# Patient Record
Sex: Female | Born: 1935
Health system: Southern US, Community
[De-identification: ages and names within clinical notes are randomized; demographics above are authoritative.]

## PROBLEM LIST (undated history)

## (undated) DIAGNOSIS — R059 Cough, unspecified: Secondary | ICD-10-CM

## (undated) DIAGNOSIS — I10 Essential (primary) hypertension: Secondary | ICD-10-CM

## (undated) DIAGNOSIS — I4891 Unspecified atrial fibrillation: Secondary | ICD-10-CM

## (undated) DIAGNOSIS — D649 Anemia, unspecified: Secondary | ICD-10-CM

## (undated) DIAGNOSIS — R251 Tremor, unspecified: Secondary | ICD-10-CM

## (undated) DIAGNOSIS — R05 Cough: Secondary | ICD-10-CM

## (undated) DIAGNOSIS — J189 Pneumonia, unspecified organism: Secondary | ICD-10-CM

## (undated) DIAGNOSIS — I4719 Other supraventricular tachycardia: Secondary | ICD-10-CM

## (undated) DIAGNOSIS — G454 Transient global amnesia: Secondary | ICD-10-CM

## (undated) DIAGNOSIS — Z9289 Personal history of other medical treatment: Secondary | ICD-10-CM

## (undated) DIAGNOSIS — I471 Supraventricular tachycardia: Secondary | ICD-10-CM

## (undated) DIAGNOSIS — Z9189 Other specified personal risk factors, not elsewhere classified: Secondary | ICD-10-CM

## (undated) DIAGNOSIS — C801 Malignant (primary) neoplasm, unspecified: Secondary | ICD-10-CM

## (undated) DIAGNOSIS — K759 Inflammatory liver disease, unspecified: Secondary | ICD-10-CM

## (undated) HISTORY — DX: Other supraventricular tachycardia: I47.19

## (undated) HISTORY — PX: ABDOMINAL HERNIA REPAIR: SHX539

## (undated) HISTORY — DX: Essential (primary) hypertension: I10

## (undated) HISTORY — PX: BACK SURGERY: SHX140

## (undated) HISTORY — PX: TONSILLECTOMY: SUR1361

## (undated) HISTORY — DX: Inflammatory liver disease, unspecified: K75.9

## (undated) HISTORY — DX: Unspecified atrial fibrillation: I48.91

## (undated) HISTORY — DX: Supraventricular tachycardia: I47.1

## (undated) HISTORY — DX: Tremor, unspecified: R25.1

## (undated) HISTORY — DX: Transient global amnesia: G45.4

---

## 2007-01-10 ENCOUNTER — Ambulatory Visit: Payer: Self-pay | Admitting: Internal Medicine

## 2007-01-10 ENCOUNTER — Ambulatory Visit: Payer: Self-pay | Admitting: Cardiology

## 2007-01-10 ENCOUNTER — Inpatient Hospital Stay (HOSPITAL_COMMUNITY): Admission: EM | Admit: 2007-01-10 | Discharge: 2007-01-12 | Payer: Self-pay | Admitting: Emergency Medicine

## 2007-01-10 DIAGNOSIS — G454 Transient global amnesia: Secondary | ICD-10-CM

## 2007-01-11 ENCOUNTER — Encounter (INDEPENDENT_AMBULATORY_CARE_PROVIDER_SITE_OTHER): Payer: Self-pay | Admitting: Internal Medicine

## 2007-01-12 ENCOUNTER — Encounter (INDEPENDENT_AMBULATORY_CARE_PROVIDER_SITE_OTHER): Payer: Self-pay | Admitting: Internal Medicine

## 2007-01-20 ENCOUNTER — Ambulatory Visit: Payer: Self-pay | Admitting: Cardiology

## 2007-01-20 ENCOUNTER — Encounter: Payer: Self-pay | Admitting: Cardiology

## 2007-01-20 ENCOUNTER — Ambulatory Visit: Payer: Self-pay

## 2007-02-02 ENCOUNTER — Ambulatory Visit: Payer: Self-pay | Admitting: Cardiology

## 2007-03-16 ENCOUNTER — Ambulatory Visit: Payer: Self-pay | Admitting: Cardiology

## 2007-04-27 ENCOUNTER — Ambulatory Visit: Payer: Self-pay | Admitting: Cardiology

## 2007-07-15 ENCOUNTER — Ambulatory Visit: Payer: Self-pay | Admitting: Cardiology

## 2007-07-30 ENCOUNTER — Ambulatory Visit: Payer: Self-pay | Admitting: Cardiology

## 2007-07-30 LAB — CONVERTED CEMR LAB
Calcium: 9.1 mg/dL (ref 8.4–10.5)
Chloride: 99 meq/L (ref 96–112)
GFR calc Af Amer: 104 mL/min
Glucose, Bld: 95 mg/dL (ref 70–99)
Potassium: 3.5 meq/L (ref 3.5–5.1)
Sodium: 136 meq/L (ref 135–145)

## 2007-08-24 ENCOUNTER — Ambulatory Visit: Payer: Self-pay

## 2007-11-26 ENCOUNTER — Ambulatory Visit: Payer: Self-pay | Admitting: Cardiology

## 2007-11-26 LAB — CONVERTED CEMR LAB
Calcium: 9.4 mg/dL (ref 8.4–10.5)
Chloride: 103 meq/L (ref 96–112)
GFR calc Af Amer: 153 mL/min
TSH: 2.75 microintl units/mL (ref 0.35–5.50)

## 2007-12-01 ENCOUNTER — Ambulatory Visit: Payer: Self-pay | Admitting: Cardiology

## 2007-12-07 ENCOUNTER — Ambulatory Visit: Payer: Self-pay | Admitting: Cardiology

## 2007-12-07 LAB — CONVERTED CEMR LAB
CO2: 27 meq/L (ref 19–32)
Chloride: 108 meq/L (ref 96–112)
Creatinine, Ser: 0.6 mg/dL (ref 0.4–1.2)
GFR calc non Af Amer: 103 mL/min
Glucose, Bld: 96 mg/dL (ref 70–99)
Potassium: 4.2 meq/L (ref 3.5–5.1)
Sodium: 142 meq/L (ref 135–145)

## 2007-12-10 ENCOUNTER — Ambulatory Visit: Payer: Self-pay | Admitting: Cardiology

## 2007-12-17 ENCOUNTER — Ambulatory Visit: Payer: Self-pay | Admitting: Cardiology

## 2007-12-17 LAB — CONVERTED CEMR LAB
BUN: 17 mg/dL (ref 6–23)
Potassium: 4.4 meq/L (ref 3.5–5.1)
Sodium: 137 meq/L (ref 135–145)

## 2008-01-10 ENCOUNTER — Ambulatory Visit: Payer: Self-pay | Admitting: Cardiology

## 2008-01-10 LAB — CONVERTED CEMR LAB
BUN: 11 mg/dL (ref 6–23)
CO2: 27 meq/L (ref 19–32)
Calcium: 9.1 mg/dL (ref 8.4–10.5)
Chloride: 106 meq/L (ref 96–112)
Creatinine, Ser: 0.7 mg/dL (ref 0.4–1.2)
GFR calc non Af Amer: 86 mL/min
Glucose, Bld: 94 mg/dL (ref 70–99)
Sodium: 139 meq/L (ref 135–145)

## 2008-01-24 ENCOUNTER — Ambulatory Visit: Payer: Self-pay | Admitting: Cardiology

## 2008-02-08 ENCOUNTER — Ambulatory Visit: Payer: Self-pay | Admitting: Cardiology

## 2008-02-08 LAB — CONVERTED CEMR LAB
CO2: 26 meq/L (ref 19–32)
Chloride: 102 meq/L (ref 96–112)
Creatinine, Ser: 0.7 mg/dL (ref 0.4–1.2)
GFR calc Af Amer: 104 mL/min
Glucose, Bld: 96 mg/dL (ref 70–99)
Potassium: 4.5 meq/L (ref 3.5–5.1)
Sodium: 135 meq/L (ref 135–145)

## 2008-03-26 DIAGNOSIS — R259 Unspecified abnormal involuntary movements: Secondary | ICD-10-CM

## 2008-03-26 DIAGNOSIS — E669 Obesity, unspecified: Secondary | ICD-10-CM

## 2008-03-27 ENCOUNTER — Encounter (INDEPENDENT_AMBULATORY_CARE_PROVIDER_SITE_OTHER): Payer: Self-pay | Admitting: *Deleted

## 2008-03-27 ENCOUNTER — Encounter: Payer: Self-pay | Admitting: Cardiology

## 2008-03-27 ENCOUNTER — Ambulatory Visit: Payer: Self-pay | Admitting: Cardiology

## 2008-03-27 DIAGNOSIS — I471 Supraventricular tachycardia, unspecified: Secondary | ICD-10-CM | POA: Insufficient documentation

## 2008-05-22 ENCOUNTER — Encounter: Payer: Self-pay | Admitting: Cardiology

## 2008-05-22 ENCOUNTER — Ambulatory Visit: Payer: Self-pay | Admitting: Cardiology

## 2008-05-22 DIAGNOSIS — I10 Essential (primary) hypertension: Secondary | ICD-10-CM

## 2008-07-18 ENCOUNTER — Telehealth: Payer: Self-pay | Admitting: Cardiology

## 2008-09-07 ENCOUNTER — Telehealth: Payer: Self-pay | Admitting: Cardiology

## 2008-09-07 ENCOUNTER — Ambulatory Visit: Payer: Self-pay | Admitting: Internal Medicine

## 2008-09-07 DIAGNOSIS — Z9189 Other specified personal risk factors, not elsewhere classified: Secondary | ICD-10-CM | POA: Insufficient documentation

## 2008-09-07 HISTORY — DX: Other specified personal risk factors, not elsewhere classified: Z91.89

## 2008-09-12 ENCOUNTER — Encounter: Payer: Self-pay | Admitting: Internal Medicine

## 2008-09-14 ENCOUNTER — Ambulatory Visit: Payer: Self-pay

## 2008-09-27 ENCOUNTER — Encounter: Payer: Self-pay | Admitting: Cardiology

## 2008-09-28 ENCOUNTER — Ambulatory Visit: Payer: Self-pay | Admitting: Cardiology

## 2008-12-20 ENCOUNTER — Telehealth: Payer: Self-pay | Admitting: Cardiology

## 2008-12-28 ENCOUNTER — Encounter (INDEPENDENT_AMBULATORY_CARE_PROVIDER_SITE_OTHER): Payer: Self-pay | Admitting: *Deleted

## 2009-04-09 ENCOUNTER — Ambulatory Visit: Payer: Self-pay | Admitting: Cardiology

## 2009-08-15 ENCOUNTER — Telehealth: Payer: Self-pay | Admitting: Cardiology

## 2009-11-19 ENCOUNTER — Ambulatory Visit: Payer: Self-pay | Admitting: Cardiology

## 2010-02-05 ENCOUNTER — Emergency Department (HOSPITAL_COMMUNITY)
Admission: EM | Admit: 2010-02-05 | Discharge: 2010-02-06 | Payer: Self-pay | Source: Home / Self Care | Admitting: Emergency Medicine

## 2010-02-06 ENCOUNTER — Encounter: Payer: Self-pay | Admitting: Cardiology

## 2010-02-06 ENCOUNTER — Ambulatory Visit
Admission: RE | Admit: 2010-02-06 | Discharge: 2010-02-06 | Payer: Self-pay | Source: Home / Self Care | Attending: Cardiology | Admitting: Cardiology

## 2010-02-06 ENCOUNTER — Ambulatory Visit: Admission: RE | Admit: 2010-02-06 | Discharge: 2010-02-06 | Payer: Self-pay | Source: Home / Self Care

## 2010-02-06 ENCOUNTER — Telehealth: Payer: Self-pay | Admitting: Cardiology

## 2010-02-06 DIAGNOSIS — I499 Cardiac arrhythmia, unspecified: Secondary | ICD-10-CM

## 2010-02-06 DIAGNOSIS — R002 Palpitations: Secondary | ICD-10-CM

## 2010-02-26 ENCOUNTER — Encounter: Payer: Self-pay | Admitting: Cardiology

## 2010-03-10 LAB — CONVERTED CEMR LAB
BUN: 19 mg/dL (ref 6–23)
Basophils Absolute: 0 10*3/uL (ref 0.0–0.1)
CO2: 24 meq/L (ref 19–32)
CO2: 26 meq/L (ref 19–32)
Creatinine, Ser: 0.7 mg/dL (ref 0.4–1.2)
Creatinine, Ser: 0.7 mg/dL (ref 0.4–1.2)
Eosinophils Absolute: 0 10*3/uL (ref 0.0–0.7)
Eosinophils Relative: 0.4 % (ref 0.0–5.0)
GFR calc Af Amer: 104 mL/min
GFR calc non Af Amer: 81.53 mL/min (ref >60)
GFR calc non Af Amer: 85.84 mL/min (ref >60)
GFR calc non Af Amer: 86 mL/min
HCT: 48.3 % — ABNORMAL HIGH (ref 36.0–46.0)
MCHC: 33.4 g/dL (ref 30.0–36.0)
MCV: 89.7 fL (ref 78.0–100.0)
Monocytes Relative: 9.4 % (ref 3.0–12.0)
Platelets: 327 10*3/uL (ref 150.0–400.0)
RBC: 5.38 M/uL — ABNORMAL HIGH (ref 3.87–5.11)
Sodium: 135 meq/L (ref 135–145)
Sodium: 136 meq/L (ref 135–145)
WBC: 8.1 10*3/uL (ref 4.5–10.5)

## 2010-03-12 NOTE — Progress Notes (Signed)
Summary: refill meds  Phone Note Refill Request Call back at Home Phone [phone] Message from:  Patient on August 15, 2009 3:01 PM  Refills Requested: Medication #1:  SPIRONOLACTONE 50 MG TABS one by mouth daily  Medication #2:  METOPROLOL TARTRATE 25 MG TABS 1/2 tablet twice daily. [employer].    Method Requested: Fax to Mail Away Pharmacy Initial call taken by: Lorne Skeens,  August 15, 2009 3:02 PM  Follow-up for Phone Call        RX sent into pharmacy. Pt notified. Marrion Coy, CNA  August 16, 2009 10:20 AM  Follow-up by: Marrion Coy, CNA,  August 16, 2009 10:20 AM    Prescriptions: SPIRONOLACTONE 50 MG TABS (SPIRONOLACTONE) one by mouth daily  #90 x 3   Entered by:   Marrion Coy, CNA   Authorized by:   Rollene Rotunda, MD, [facility]   Signed by:   Marrion Coy, CNA on 08/16/2009   Method used:   Electronically to        [facility]* (mail-order)       [address] [place], [place]  [address]       Ph: [phone]       Fax: [phone]   RxID:   [id number] METOPROLOL TARTRATE 25 MG TABS (METOPROLOL TARTRATE) 1/2 tablet twice daily  #90 x 3   Entered by:   Marrion Coy, CNA   Authorized by:   Rollene Rotunda, MD, [facility]   Signed by:   Marrion Coy, CNA on 08/16/2009   Method used:   Electronically to        [facility]* (mail-order)       [address] [place], [place]  [address]       Ph: [phone]       Fax: [phone]   RxID:   [id number]

## 2010-03-12 NOTE — Assessment & Plan Note (Signed)
Summary: 6 month 401.1   Visit Type:  Follow-up Primary Provider:  Newt Lukes MD  CC:  HTN.  History of Present Illness: The patient presents for followup of difficult to control hypertension. I last saw her she has had no new cardiovascular complaints. She walks daily. With this level of activity she denies any chest pressure, neck or arm discomfort. She has no palpitations, presyncope or syncope. She has none of the episodes of  transient global amnesia that she had previously.  She has had some mild right greater than left ankle edema recently.  Current Medications (verified): 1)  Clonidine Hcl 0.3 Mg Tabs (Clonidine Hcl) .... By Mouth Two Times A Day 2)  Spironolactone 50 Mg Tabs (Spironolactone) .... One By Mouth Daily 3)  Amlodipine Besylate 10 Mg Tabs (Amlodipine Besylate) .... Take 1 Tablet By Mouth Daily 4)  Metoprolol Tartrate 25 Mg Tabs (Metoprolol Tartrate) .... 1/2 Tablet Twice Daily  Allergies (verified): 1)  ! Pcn 2)  ! Sulfa 3)  ! Codeine 4)  ! Aspirin 5)  ! Hydrochlorothiazide  Past History:  Past Medical History: Reviewed history from 09/07/2008 and no changes required. Hypertension  Tremors, benign familal  Transient global amnesia/AMS Atrial tachycardia, PSVT Hepatitis/Jaundice @ age 50 or 83  Past Surgical History: Reviewed history from 09/07/2008 and no changes required. Back surgery years ago. Hernia abdominal Tonsillectomy  Review of Systems       As stated in the HPI and negative for all other systems.   Vital Signs:  Patient profile:   77 year old female Height:      63 inches Weight:      160 pounds BMI:     28.45 Pulse rate:   61 / minute Resp:     16 per minute BP sitting:   168 / 75  (right arm)  Vitals Entered By: Marrion Coy, CNA (November 19, 2009 11:18 AM)  Physical Exam  General:  Well developed, well nourished, in no acute distress. Head:  normocephalic and atraumatic Eyes:  PERRLA/EOM intact; conjunctiva and lids  normal. Neck:  Neck supple, no JVD. No masses, thyromegaly or abnormal cervical nodes. Chest Wall:  no deformities or breast masses noted Lungs:  Clear bilaterally to auscultation and percussion. Heart:  Non-displaced PMI, chest non-tender; regular rate and rhythm, S1, S2 without murmurs, rubs or gallops. Carotid upstroke normal, no bruit. Normal abdominal aortic size, no bruits. Femorals normal pulses, no bruits. Pedals normal pulses. No edema, no varicosities. Abdomen:  Bowel sounds positive; abdomen soft and non-tender without masses, organomegaly, or hernias noted. No hepatosplenomegaly. Msk:  Back normal, normal gait. Muscle strength and tone normal. Extremities:  trace left pedal edema and 1+ right pedal edema.   Neurologic:  Alert and oriented x 3. Skin:  Intact without lesions or rashes. Cervical Nodes:  no significant adenopathy Inguinal Nodes:  no significant adenopathy Psych:  Normal affect.   EKG  Procedure date:  11/19/2009  Findings:      Sinus rhythm, rate 61, axis within normal limits thumb intervals within normal limits,  Impression & Recommendations:  Problem # 1:  ESSENTIAL HYPERTENSION, BENIGN (ICD-401.1) She brings a blood pressure diary today her pressures are typically will call. She is tolerating his medical regimen. He is quite happy with the situation as is her daughter therefore, I will not tinker with her regimen.  I will check a basic metabolic profile which should be done twice yearly at least on Spironolactone. Orders: TLB-BMP (Basic Metabolic Panel-BMET) (  80048-METABOL) EKG w/ Interpretation (93000)  Problem # 2:  PSVT (ICD-427.0) She has had no tachycardia palpitations. No change in therapy is indicated. Orders: EKG w/ Interpretation (93000)  Problem # 3:  OBESITY, UNSPECIFIED (ICD-278.00) Her weights have been stable.  She will continue with exercise and a healthy diet.  Patient Instructions: 1)  Your physician recommends that you schedule a  follow-up appointment in: 6 months with Dr Antoine Poche 2)  Your physician recommends that you have lab work today  BMP  401.1 v58.69 3)  Your physician recommends that you continue on your current medications as directed. Please refer to the Current Medication list given to you today.

## 2010-03-12 NOTE — Assessment & Plan Note (Signed)
Summary: 6 month rov/sl   Visit Type:  Follow-up Primary Provider:  Newt Lukes MD  CC:  HTN.  History of Present Illness: The patient presents for followup of difficult to control hypertension. Since I last saw her she has had no new medical problems. She is exercising about 30 minutes 5 times per week in the house. She has no limitations with this. She shows me a very recent blood pressure diary and her systolics are in the 140s and occasionally 150 range with diastolics in the 60s to 70s. Her heart rate is in the 40s to 50s. She denies any shortness of breath, PND or orthopnea. She has had no chest pressure, neck or arm discomfort. She doesn't feel palpitations, presyncope or syncope. She has had none of the altered consciousness that she had previously. She's had no tachyarrhythmias which have bothered her previously.  Current Medications (verified): 1)  Clonidine Hcl 0.3 Mg Tabs (Clonidine Hcl) .... By Mouth Two Times A Day 2)  Spironolactone 50 Mg Tabs (Spironolactone) .... One By Mouth Daily 3)  Amlodipine Besylate 10 Mg Tabs (Amlodipine Besylate) .... Take 1 Tablet By Mouth Daily 4)  Metoprolol Tartrate 25 Mg Tabs (Metoprolol Tartrate) .... 1/2 Tablet Twice Daily  Allergies (verified): 1)  ! Pcn 2)  ! Sulfa 3)  ! Codeine 4)  ! Aspirin 5)  ! Hydrochlorothiazide  Past History:  Past Medical History: Reviewed history from 09/07/2008 and no changes required. Hypertension  Tremors, benign familal  Transient global amnesia/AMS Atrial tachycardia, PSVT Hepatitis/Jaundice @ age 12 or 70  Past Surgical History: Reviewed history from 09/07/2008 and no changes required. Back surgery years ago. Hernia abdominal Tonsillectomy  Review of Systems       As stated in the HPI and negative for all other systems.   Vital Signs:  Patient profile:   77 year old female Height:      63 inches Weight:      159 pounds BMI:     28.27 Pulse rate:   52 / minute Resp:     16 per  minute BP sitting:   160 / 92  (right arm)  Vitals Entered By: Marrion Coy, CNA (April 09, 2009 2:55 PM)  Physical Exam  General:  Well developed, well nourished, in no acute distress. Head:  normocephalic and atraumatic Eyes:  PERRLA/EOM intact; conjunctiva and lids normal. Mouth:  Teeth, gums and palate normal. Oral mucosa normal. Neck:  Neck supple, no JVD. No masses, thyromegaly or abnormal cervical nodes. Chest Wall:  no deformities or breast masses noted Lungs:  Clear bilaterally to auscultation and percussion. Heart:  Non-displaced PMI, chest non-tender; regular rate and rhythm, S1, S2 without murmurs, rubs or gallops. Carotid upstroke normal, no bruit. Normal abdominal aortic size, no bruits. Femorals normal pulses, no bruits. Pedals normal pulses.  Abdomen:  Bowel sounds positive; abdomen soft and non-tender without masses, organomegaly, or hernias noted. No hepatosplenomegaly. Msk:  Back normal, normal gait. Muscle strength and tone normal. Extremities:  mild right greater than left lower extremity edema, no cyanosis, clubbing Neurologic:  resting tremor, otherwise cranial nerves intact, motor grossly intact Skin:  Intact without lesions or rashes.   EKG  Procedure date:  04/09/2009  Findings:      sinus bradycardia, rate 52, axis within normal limits, intervals within normal limits, no acute ST-T wave changes.  Impression & Recommendations:  Problem # 1:  ESSENTIAL HYPERTENSION, BENIGN (ICD-401.1) We discussed the fact that her blood pressure is still not  at target. However, she has been sensitive to a variety of medications. She actually feels quite well on the current regimen. She is at peak doses of that I think she would tolerate out each class. I am reluctant to try another class. She could reduce her salt intake and lose 5 pounds and potentially be at the 140/90.  She agrees to try this. Orders: EKG w/ Interpretation (93000)  Problem # 2:  PSVT  (ICD-427.0) She has had no symptomatic recurrence of this.she will remain on a very low-dose beta blocker.  Problem # 3:  OBESITY, UNSPECIFIED (ICD-278.00) She understands the need to lose weight as described above.  Patient Instructions: 1)  Your physician recommends that you schedule a follow-up appointment in: 6 months with Dr Antoine Poche 2)  Your physician recommends that you continue on your current medications as directed. Please refer to the Current Medication list given to you today.

## 2010-03-14 NOTE — Progress Notes (Signed)
Summary: pt had a-fib and was in ER  Phone Note Call from Patient Call back at [phone] or [phone]   Caller: Daughter/Janet Mazzurco Reason for Call: Talk to Nurse, Talk to Doctor Summary of Call: pt was taken to ED by EMS was having a-fib yesterday and they released her this morning at 2am and was told she needs to f/u with Dr. Antoine Poche today and daughter wants a call right away. Initial call taken by: Omer Jack,  February 06, 2010 8:44 AM  Follow-up for Phone Call        per daughter - was told pt had to be seen today to follow up AT [facility] that she was treated for in ED last night.  Appt was given for 11:30 am today Follow-up by: Charolotte Capuchin, RN,  February 06, 2010 9:16 AM

## 2010-03-14 NOTE — Assessment & Plan Note (Signed)
Summary: p/host AT FIB  pfh,rn   Visit Type:  Follow-up Primary Provider:  Newt Lukes MD  CC:  Arrhythmia.  History of Present Illness: The patient presents after having been seen in the emergency room last night for evaluation of arrhythmia. She's had a couple episodes over the past 3 months including one yesterday where she gets some vague symptoms of shakiness and sweating. She got some slight chills. She didn't particularly feel any tachycardia palpitations. She wasn't presyncopal and didn't have any syncope. She had no chest pressure, neck or arm discomfort. The first episode apparently resolved fairly quickly. Last night however it persisted and EMS was called. She was sent to be in atrial fibrillation. I do not see an EKG in the system documenting this. She was said to convert to sinus rhythm about 5 hours after the onset. I do see an EKG from this morning in sinus rhythm. She had normal blood pressure throughout. Cardiac enzymes and other blood work was unremarkable and she was discharged from the ER. She has otherwise been feeling well. She's not been describing any chest pressure, neck or arm discomfort. She has not been having any weight gain or edema. She has had no cough fevers or chills.  Current Medications (verified): 1)  Clonidine Hcl 0.3 Mg Tabs (Clonidine Hcl) .... By Mouth Two Times A Day 2)  Spironolactone 50 Mg Tabs (Spironolactone) .... One By Mouth Daily 3)  Amlodipine Besylate 10 Mg Tabs (Amlodipine Besylate) .... Take 1 Tablet By Mouth Daily 4)  Metoprolol Tartrate 25 Mg Tabs (Metoprolol Tartrate) .... 1/2 Tablet Twice Daily  Allergies (verified): 1)  ! Pcn 2)  ! Sulfa 3)  ! Codeine 4)  ! Aspirin 5)  ! Hydrochlorothiazide  Past History:  Past Medical History: Reviewed history from 09/07/2008 and no changes required. Hypertension  Tremors, benign familal  Transient global amnesia/AMS Atrial tachycardia, PSVT Hepatitis/Jaundice @ age 33 or 46  Past  Surgical History: Reviewed history from 09/07/2008 and no changes required. Back surgery years ago. Hernia abdominal Tonsillectomy  Review of Systems       As stated in the HPI and negative for all other systems.   Vital Signs:  Patient profile:   77 year old female Height:      63 inches Weight:      162 pounds BMI:     28.80 Pulse rate:   77 / minute Resp:     16 per minute BP sitting:   148 / 90  (right arm)  Vitals Entered By: Marrion Coy, CNA (February 06, 2010 11:42 AM)  Physical Exam  General:  Well developed, well nourished, in no acute distress. Head:  normocephalic and atraumatic Eyes:  PERRLA/EOM intact; conjunctiva and lids normal. Mouth:  Teeth, gums and palate normal. Oral mucosa normal. Neck:  Neck supple, no JVD. No masses, thyromegaly or abnormal cervical nodes. Chest Wall:  no deformities Lungs:  Clear bilaterally to auscultation and percussion.   Detailed Cardiovascular Exam  Neck    Carotids: Carotids full and equal bilaterally without bruits.      Neck Veins: Normal, no JVD.    Heart    Inspection: no deformities or lifts noted.      Palpation: normal PMI with no thrills palpable.      Auscultation: regular rate and rhythm, S1, S2 without murmurs, rubs, gallops, or clicks.    Vascular    Abdominal Aorta: no palpable masses, pulsations, or audible bruits.      Femoral  Pulses: normal femoral pulses bilaterally.      Pedal Pulses: pulses normal in all 4 extremities    Radial Pulses: normal radial pulses bilaterally.      Peripheral Circulation: no clubbing, cyanosis, or edema noted with normal capillary refill.     EKG  Procedure date:  02/06/2010  Findings:      Sinus rhythm, rate 58, axis within normal limits, intervals within normal limits, no acute ST-T wave changes.  Impression & Recommendations:  Problem # 1:  CARDIAC ARRHYTHMIA (ICD-427.9) Patient has had multifocal atrial tachycardia incorrectly read as atrial fibrillation in  the past. I will need to get the EKG from EMS to confirm atrial fibrillation at which point she would need Coumadin. I am going to apply a 21 day monitor to see how frequently she is having arrhythmias. For now I would not suggest a change in her therapy as the events are symptomatically fairly infrequent.  Problem # 2:  ESSENTIAL HYPERTENSION, BENIGN (ICD-401.1) Her blood pressure has actually been well controlled. She will continue meds as listed.  Other Orders: Event (Event)  Patient Instructions: 1)  Your physician recommends that you schedule a follow-up appointment in 2 months with Dr Antoine Poche 2)  Your physician recommends that you continue on your current medications as directed. Please refer to the Current Medication list given to you today. 3)  Your physician has recommended that you wear an event monitor to wear for 21 days.  Event monitors are medical devices that record the heart's electrical activity. Doctors most often use these monitors to diagnose arrhythmias. Arrhythmias are problems with the speed or rhythm of the heartbeat. The monitor is a small, portable device. You can wear one while you do your normal daily activities. This is usually used to diagnose what is causing palpitations/syncope (passing out).

## 2010-04-03 ENCOUNTER — Ambulatory Visit (INDEPENDENT_AMBULATORY_CARE_PROVIDER_SITE_OTHER): Payer: MEDICARE | Admitting: Cardiology

## 2010-04-03 ENCOUNTER — Encounter: Payer: Self-pay | Admitting: Cardiology

## 2010-04-03 DIAGNOSIS — R002 Palpitations: Secondary | ICD-10-CM

## 2010-04-03 DIAGNOSIS — I10 Essential (primary) hypertension: Secondary | ICD-10-CM

## 2010-04-09 NOTE — Assessment & Plan Note (Signed)
Summary: 2 month.dm/sp   Visit Type:  Follow-up Primary Provider:  Newt Lukes MD  CC:  palpitations.  History of Present Illness: The patient presents for followup of palpitations. She was in the emergency room earlier in the year with what was described as atrial fibrillation. However, I went to the hospital told those records and reviewed EMS strips and this was atrial tachycardia multifocal and not fibrillation. She wore an event monitor and there was no evidence of fibrillation. She had no symptomatic arrhythmias. Since that time she has felt well and has had no presyncope or syncope. He has had no chest discomfort or shortness of breath.  Current Medications (verified): 1)  Clonidine Hcl 0.3 Mg Tabs (Clonidine Hcl) .... By Mouth Two Times A Day 2)  Spironolactone 50 Mg Tabs (Spironolactone) .... One By Mouth Daily 3)  Amlodipine Besylate 10 Mg Tabs (Amlodipine Besylate) .... Take 1 Tablet By Mouth Daily 4)  Metoprolol Tartrate 25 Mg Tabs (Metoprolol Tartrate) .... 1/2 Tablet Twice Daily  Allergies (verified): 1)  ! Pcn 2)  ! Sulfa 3)  ! Codeine 4)  ! Aspirin 5)  ! Hydrochlorothiazide  Past History:  Past Medical History: Reviewed history from 09/07/2008 and no changes required. Hypertension  Tremors, benign familal  Transient global amnesia/AMS Atrial tachycardia, PSVT Hepatitis/Jaundice @ age 39 or 14  Past Surgical History: Reviewed history from 09/07/2008 and no changes required. Back surgery years ago. Hernia abdominal Tonsillectomy  Review of Systems       As stated in the HPI and negative for all other systems.   Vital Signs:  Patient profile:   77 year old female Height:      63 inches Weight:      165 pounds BMI:     29.33 Pulse rate:   54 / minute Resp:     18 per minute BP sitting:   169 / 73  (right arm)  Vitals Entered By: Marrion Coy, CNA (April 03, 2010 11:33 AM)  Physical Exam  General:  Well developed, well nourished, in no  acute distress. Head:  normocephalic and atraumatic Neck:  Neck supple, no JVD. No masses, thyromegaly or abnormal cervical nodes. Chest Wall:  no deformities Lungs:  Clear bilaterally to auscultation and percussion. Abdomen:  Bowel sounds positive; abdomen soft and non-tender without masses, organomegaly, or hernias noted. No hepatosplenomegaly. Msk:  Back normal, normal gait. Muscle strength and tone normal. Extremities:  trace left pedal edema and 1+ right pedal edema.   Neurologic:  Alert and oriented x 3. Skin:  Intact without lesions or rashes. Cervical Nodes:  no significant adenopathy Inguinal Nodes:  no significant adenopathy Psych:  Normal affect.   Detailed Cardiovascular Exam  Neck    Carotids: Carotids full and equal bilaterally without bruits.      Neck Veins: Normal, no JVD.    Heart    Inspection: no deformities or lifts noted.      Palpation: normal PMI with no thrills palpable.      Auscultation: regular rate and rhythm, S1, S2 without murmurs, rubs, gallops, or clicks.    Vascular    Abdominal Aorta: no palpable masses, pulsations, or audible bruits.      Femoral Pulses: normal femoral pulses bilaterally.      Pedal Pulses: pulses normal in all 4 extremities    Radial Pulses: normal radial pulses bilaterally.      Peripheral Circulation: no clubbing, cyanosis, or edema noted with normal capillary refill.  Impression & Recommendations:  Problem # 1:  CARDIAC ARRHYTHMIA (ICD-427.9) She has had no further symptomatic dysrhythmias. No change in therapy is indicated. I see no indication for Coumadin as I see no documented atrial fibrillation.  Problem # 2:  ESSENTIAL HYPERTENSION, BENIGN (ICD-401.1) Her blood pressure has fluctuated in the past. It is elevated today. I have asked her to check it about twice a week and if her systolics are consistently above 100 we will change her medications  Patient Instructions: 1)  Your physician recommends that you  schedule a follow-up appointment in: 6 months withDr Nadean Montanaro 2)  Your physician recommends that you continue on your current medications as directed. Please refer to the Current Medication list given to you today.

## 2010-04-18 NOTE — Procedures (Signed)
Summary: Summary Report  Summary Report   Imported By: Erle Crocker 04/10/2010 16:11:36  _____________________________________________________________________  External Attachment:    Type:   Image     Comment:   External Document

## 2010-04-22 LAB — POCT I-STAT, CHEM 8
BUN: 18 mg/dL (ref 6–23)
Calcium, Ion: 1.09 mmol/L — ABNORMAL LOW (ref 1.12–1.32)
Glucose, Bld: 136 mg/dL — ABNORMAL HIGH (ref 70–99)
Hemoglobin: 15.6 g/dL — ABNORMAL HIGH (ref 12.0–15.0)

## 2010-04-22 LAB — POCT CARDIAC MARKERS
Myoglobin, poc: 52.1 ng/mL (ref 12–200)
Troponin i, poc: <0.05 ng/mL (ref 0.00–0.09)

## 2010-04-22 LAB — URINALYSIS, ROUTINE W REFLEX MICROSCOPIC
Bilirubin Urine: NEGATIVE
Ketones, ur: NEGATIVE mg/dL
Nitrite: NEGATIVE
Protein, ur: NEGATIVE mg/dL
Specific Gravity, Urine: 1.009 (ref 1.005–1.030)
pH: 6 (ref 5.0–8.0)

## 2010-04-22 LAB — URINE MICROSCOPIC-ADD ON

## 2010-06-25 NOTE — H&P (Signed)
NAMEMATTEA, SEGER NO.:  [id number]   MEDICAL RECORD NO.:  [id number]          PATIENT TYPE:  INP   LOCATION:  6737                         FACILITY:  [facility]   PHYSICIAN:  Therisa Doyne, MD    DATE OF BIRTH:  12/17/1927   DATE OF ADMISSION:  01/10/2007  DATE OF DISCHARGE:                              HISTORY & PHYSICAL   PRIMARY CARE PHYSICIAN:  Chales Salmon. Abigail Miyamoto, M.D.   CHIEF COMPLAINT:  Episode of confusion.   HISTORY OF PRESENT ILLNESS:  This 77 year old white female with past  medical history significant for hypertension who presents to the  emergency department after an episode of confusion and reported amnesia  today.  This morning the patient reports going to church and was in her  usual state health; however, when she came home, she had difficulty  opening her front door with using her keys.  Her husband came to the  door and opened it for her.  When she entered, she began asking random  questions to her husband.  These statements were coherent sentences;  however, the husband reports that the patient neither answers these  questions and her behavior was somewhat bizarre.  The patient remembers  none of this behavior.  She was brought to the emergency department for  further evaluation where she was found to have an elevated blood  pressure of 186/99, this was treated medically and her blood pressure  has since decreased.  With the improvement in her blood pressure, her  mental status has also improved.   The patient denies any headaches, vision changes, slurred speech,  numbness, weakness, or tingling.  Additionally, she denies fever,  chills, chest pain, shortness of breath, cough, or genitourinary  symptoms.   PAST MEDICAL HISTORY:  1. Hypertension.  2. Seasonal allergies.   SOCIAL HISTORY:  The patient lives at home with her husband.  She denies  tobacco, alcohol, or drugs.   FAMILY HISTORY:  Positive for Parkinson's disease.   MEDICATIONS:  1. Toprol XL 25 mg daily.  2. Benicar 20 mg daily.   ALLERGIES:  1. PENICILLIN.  2. SULFA.  3. CODEINE.   REVIEW OF SYSTEMS:  All systems were reviewed and are negative, except  as mentioned above in history of present illness.   PHYSICAL EXAMINATION:  VITAL SIGNS:  Temperature 97.8, blood pressure  123/68, pulse 66, respirations 20.  GENERAL:  No acute distress.  HEENT:  Normocephalic, atraumatic.  Oropharynx pink and moist without  any lesions.  NECK:  No carotid bruits.  No lymphadenopathy.  No thyroid masses.  CARDIOVASCULAR:  Regular rate and rhythm.  No murmurs, rubs, or gallops.  CHEST:  Clear to auscultation bilaterally.  ABDOMEN:  Soft, nontender, nondistended.  EXTREMITIES:  No cyanosis, clubbing.  There was 1+ lower extremity edema  with palpable dorsalis pedis pulses.  SKIN:  No rashes.  BACK:  No CVA tenderness.  NEUROLOGIC:  Alert and oriented x3.  Cranial nerves II-XII are grossly  intact with no focal deficits.  Muscle strength is 5/5 bilateral upper  and lower extremities and sensory exam  was grossly intact.   LABORATORY DATA:  Show a CBC and CMP within normal limits.  First set of  cardiac enzymes were negative.   ASSESSMENT AND PLAN:  A 77 year old white female with a past medical  history significant for hypertension who presents to the emergency  department with a transient episode of confusion.  1. We will admit the patient to the [facility].  2. Episode of confusion.  Differential diagnosis is broad and includes      transient ischemic attack versus cerebrovascular accident versus      hypertensive encephalopathy.  Additionally, the odds can include a      seizure; however, this is less likely versus an underlying      infection.  My suspicion is that this is likely related to      fluctuations in her blood pressure based on the fact that she is      improved with better control of her blood pressure.  To further      work  this up we will followup her CAT scan, which was obtained in      the emergency department.  We will check an MRI and MRA in the      morning to rule out a cerebrovascular accident or transient      ischemic attack.  We will check a TSH and vitamin B12 levels,      folate level, urinalysis, and urine culture.  We will also check a      transthoracic echocardiogram with bubble study as well as carotid      Doppler's and transcranial Doppler's.  We will aggressively treat      the patient's blood pressure as this likely was the cause of her      confusion.  3. Hypertension.  Currently stable.  We will continue her on her beta      blocker and ARB.  4. Fluids, electrolytes and nutrition.  Saline lock IV fluids.      Electrolytes are stable.  Regular diet.  5. Prophylaxis.  Gastrointestinal prophylaxis, Protonix.  For deep      venous thrombosis prophylaxis subcutaneous Lovenox.      Therisa Doyne, MD     SJT/MEDQ  D:  01/10/2007  T:  01/11/2007  Job:  [id number]

## 2010-06-25 NOTE — Assessment & Plan Note (Signed)
[facility]                            CARDIOLOGY OFFICE NOTE   NAME:POWELLTresia, Revolorio                         MRN:          [id number]  DATE:01/20/2007                            DOB:          Sep 30, 1927    PRIMARY CARE PHYSICIAN:  Dr. Abigail Miyamoto.   REASON FOR PRESENTATION:  Evaluate patient with hypertension.   HISTORY OF PRESENT ILLNESS:  This is the second visit with this patient  who I met in the hospital.  She had altered mental status felt possibly  related to the hypertensive urgency.  We were consulted for management  of her difficulty to control blood pressure.  I made some changes  including switching her off of ARB as she had had chronic complaints of  upper respiratory congestion and sinus difficulty which there is a small  possibility of being related to that medication.  I chose Norvasc, low  dose diuretic, and to continue her beta blocker.  She has been keeping  excellent records.  Her blood pressures in the 150's to 160's in the  morning, but does go up to the 170's, 180's or 190's in the afternoon.  She is not having any further episodes of altered mental status which  was her presenting complaint.  She actually thinks that her congestion  and sinus problems are improved.  She is not having any chest  discomfort, neck or arm discomfort.  She is not having any palpitations,  PND or orthopnea  (of note the patient did have atrial tachycardia in  the hospital documented).   PAST MEDICAL HISTORY:  Hypertension x7-8 years, tremors, back surgery  several years ago.   ALLERGIES:  PENICILLIN, SULFA questionably and CODEINE.   MEDICATIONS:  1. Aspirin 81 mg daily.  2. Toprol 25 mg daily.  3. Amlodipine 5 mg daily.  4. Hydrochlorothiazide 12.5 mg daily.   REVIEW OF SYSTEMS:  As stated in the HPI and, otherwise, negative for  other systems.   PHYSICAL EXAMINATION:  GENERAL:  The patient is in no distress.  VITAL SIGNS:  Blood pressure  178/86, heart rate 67 and regular, weight  206 pounds, body mass index 33.  HEENT:  Eyes unremarkable, pupils equal, round, and reactive to light,  fundi not visualized, oral mucosa unremarkable.  NECK:  No jugular venous distention at 45 degrees, carotid upstroke  brisk and symmetric, no bruits, no thyromegaly.  LYMPHATICS:  None.  LUNGS:  Clear to auscultation bilaterally.  BACK:  No costovertebral angle tenderness.  CHEST:  Unremarkable.  HEART:  PMI not displaced or sustained.  S1, S2 within normal limits.  No S3, no S4.  No clicks, no rubs, no murmurs.  ABDOMEN:  Flat, positive bowel sounds normal in frequency and pitch.  No  bruits, no rebound, no guarding, no midline pulsatile mass.  No  hepatomegaly, no splenomegaly.  SKIN:  No rashes, no nodules.  EXTREMITIES:  2+ pulses throughout.  No edema, cyanosis or clubbing.  NEUROLOGICAL:  Oriented to person, place, and time.  Cranial nerves II-  XII grossly intact.  Motor grossly intact.   EKG:  Sinus bradycardia, rate 56, axis rightward, intervals within  normal limits.  No acute ST wave change.   ASSESSMENT/PLAN:  1. Hypertension.  Her blood pressure is still not at target.  I am      going to increase her hydrochlorothiazide to 25 mg daily.  I am      going to add potassium 10 mEq daily.  She already takes potassium-      containing foods.  I am going to change her to Toprol Immediate      Release 25 mg b.i.d.  I have explained to her that I suspect it      will take several adjustments to get to a target blood pressure      with a regimen that she tolerates.  Of note the patient did have an      echocardiogram today that demonstrated no evidence of an embolic      source, normal left ventricular function, no regional wall motion      abnormalities or valve abnormalities.  Tremors are felt to be      benign.  2. Back pain.  She has had back surgery, but has no ongoing acute      issues.  3. Altered mental status felt possibly  related to her hypertensive      urgency.  4. Followup.  I will see the patient again in about two weeks for her      next medication adjustment.  Her family insists on continued      followup in this office for control of her blood pressure.     Rollene Rotunda, MD, [facility]  Electronically Signed    JH/MedQ  DD: 01/20/2007  DT: 01/21/2007  Job #: [id number]   cc:   Chales Salmon. Abigail Miyamoto, M.D.

## 2010-06-25 NOTE — Assessment & Plan Note (Signed)
[facility]                            CARDIOLOGY OFFICE NOTE   NAME:POWELLTilia, Faso                         MRN:          [id number]  DATE:12/10/2007                            DOB:          1927/09/12    PRIMARY CARE PHYSICIAN:  [name]. Panosh, MD   REASON FOR PRESENTATION:  Evaluate the patient with bradycardia and  hypertension.   HISTORY OF PRESENT ILLNESS:  The patient returns for followup of the  above.  Since I last saw her, she had a rash and stopped  hydrochlorothiazide.  This actually seems to have improved the rash.  She has been feeling well since then.  She does bring her blood pressure  diary and her systolics are still always about 150, sometimes in the  170, and even low 180s.  Diastolics have been from 103-88.  The heart  rate when she takes this, blood pressure is fine.  She does not have any  presyncope or syncope.  She has had no chest discomfort, neck, or arm  discomfort.   Of note, I did put Holter monitor on for 48 hours to look at any  irregular rhythm that was on an EKG.  It appeared to be in atrial  tachycardia.  In fact, she did have some evidence of this atrial  tachycardia.  It was regular.  I do not see evidence of atrial  fibrillation.  There were lots of premature atrial contractions.  She  also had some brady arrhythmias.  The longest pause recorder was 2.9  seconds have not seen this recorded.  Many pauses of about 2 seconds.  All of her brady arrhythmias occurred during the sleeping hours.  It is  very clear from the trend that her heart rate drops in the 40s and 50s  when she is asleep.  It goes up twice during those hours when she gets  up to go to the bathroom.  It goes up during the day and looks to have a  normal chronotropic response.  There was some episodes of sustained  tachyarrhythmia though short-lived which was the atrial tachycardia.  With all of this, she denies any symptoms whatsoever.  I again as  mentioned, she has had no presyncope or syncope.   PAST MEDICAL HISTORY:  Hypertension, difficult to control x 8 years,  tremors, and back surgery years ago.   ALLERGIES:  PENICILLIN, SULFA, and CODEINE.   MEDICATIONS:  1. Clonidine 0.3 mg q.12 h.  2. Amlodipine 10 mg daily.   REVIEW OF SYSTEMS:  As stated in the HPI and otherwise negative for  other systems.   PHYSICAL EXAMINATION:  GENERAL:  The patient is pleasant and in no  distress.  VITAL SIGNS:  Blood pressure 167/96, heart rate 93 and regular, weight  178 pounds, and body mass index 29.  HEENT:  Eyes are unremarkable; pupils equal, round, and reactive to  light; fundi not visualized; oral mucosa unremarkable.  NECK:  No  jugular venous distention at 45 degrees; carotid upstrokes brisk and  symmetric; no bruits, no thyromegaly.  LYMPHATICS:  No  cervical,  axillary, or inguinal adenopathy.  LUNGS:  Clear to auscultation bilaterally.  BACK:  No costovertebral angle tenderness.  CHEST:  Unremarkable.  HEART:  PMI not displaced or sustained; S1 and S2 within normal limits;  no S3, no S4; no clicks, no rubs, no murmurs.  ABDOMEN:  Obese; positive  bowel sounds; normal in frequency and pitch; no bruits, rebound,  guarding, or midline pulsatile mass; no hepatomegaly; no splenomegaly.  SKIN:  No rashes; no nodules.  EXTREMITIES:  Pulses 2+ throughout; no edema, cyanosis, or clubbing.  NEURO:  Oriented to person, place, and time; cranial nerves II-XII  grossly intact; motor grossly intact.   ASSESSMENT AND PLAN:  1. Hypertension.  Blood pressure is still not well controlled.  I have      reviewed the options.  I do not want to give her any drugs that      will further slower heart rate.  Therefore, I am going to try      spironolactone.  We discussed hyperkalemia.  We will watch      potassium in 1 week and 1 month and then periodically thereafter if      she remains on this.  She should keep her blood pressure diary.  2.  Bradycardia.  The patient did have some sinus pauses of up to 2.9      seconds.  However, all of her brady arrhythmias were during the      sleeping hours.  She had no symptoms.  I discussed this at length      with the family.  There is no class I or IIA indication for pacing      in this situation.  I will avoid any AV nodal blocking agents.  In      fact, it might have to reconsider clonidine and Norvasc if she has      any further problems, but doubt if this is contributing.  I may      follow her up with telemetry in the future.  Again, I had a long      discussion with the patient and family about this.  3. Followup.  I would like to see her back in about 6 weeks or sooner.     Rollene Rotunda, MD, [facility]  Electronically Signed    JH/MedQ  DD: 12/10/2007  DT: 12/11/2007  Job #: [id number]   cc:   [name]. Fabian Sharp, MD

## 2010-06-25 NOTE — Assessment & Plan Note (Signed)
[facility]                            CARDIOLOGY OFFICE NOTE   NAME:[name], [name]                         MRN:          [id number]  DATE:02/02/2007                            DOB:          25-May-1927    PRIMARY CARE PHYSICIAN:  Chales Salmon. Abigail Miyamoto, M.D.   REASON FOR VISIT:  Evaluate patient with hypertension.   HISTORY OF PRESENT ILLNESS:  The patient presents for follow-up of her  hypertension.  At the last visit I increased her hydrochlorothiazide to  25 mg a day.  I added potassium.  I changed her from Toprol XL to  immediate release.  She has been keeping a good blood pressure diary.  She says she has much less swelling since increasing the  hydrochlorothiazide.  She has been walking a little better because of  this.  Her blood pressures have been slightly elevated particularly in  the evenings in the 160's.  They are better controlled than they had  been.  Her heart rate has been in the 50's and 60's.  She does not have  any palpitations, presyncope, or syncope.  She has had none of the  episodes of altered mental status that prompted her recent  hospitalization.  She has had no chest pain or shortness of breath.   PAST MEDICAL HISTORY:  Hypertension x7-8 years, tremors, back surgery  several years ago.   ALLERGIES:  PENICILLIN, SULFA, CODEINE.   MEDICATIONS:  1. Aspirin 81 mg daily.  2. Amlodipine 5 mg daily.  3. Hydrochlorothiazide 25 mg daily.  4. Potassium 10 mEq daily.  5. Metoprolol 25 mg b.i.d.   REVIEW OF SYSTEMS:  As stated in the HPI and otherwise negative for  other systems.   PHYSICAL EXAMINATION:  GENERAL:  The patient is in no distress.  VITAL SIGNS:  Blood pressure 156/89, heart rate 64 and regular.  HEENT:  Eyes unremarkable.  Pupils equal, round, and reactive to light.  Fundi not visualized.  Oral mucosa unremarkable.  NECK:  No jugular venous distention to 45 degrees.  Carotid upstrokes  brisk and symmetric.  No  bruits and no thyromegaly.  LYMPHATICS:  No cervical, axillary, or inguinal adenopathy.  LUNGS:  Clear to auscultation bilaterally.  BACK:  No costovertebral angle tenderness.  CHEST:  Unremarkable.  HEART:  PMI not displaced or sustained.  S1 and S2 within normal limits.  No S3, no S4, no clicks, no rubs, no murmurs.  ABDOMEN:  Obese, positive bowel sounds normal in frequency and pitch.  No bruits, no rebound, no guarding, no midline pulsatile mass, no  hepatomegaly, and no splenomegaly.  SKIN:  No rashes and no nodules.  EXTREMITIES:  2+ pulses throughout, no edema, no cyanosis or clubbing.  NEUROLOGY:  Oriented to person, place, and time.  Cranial nerves II-XII  grossly intact.  Motor grossly intact.   ASSESSMENT:  1. Hypertension.  Her blood pressure is better controlled, though      still not at target.  At this point I am going to pick a Catapres      patch #1.  I  do not want to give her beta blockers because of her      bradycardia.  I am going to avoid ACE and ARB's as I think she had      some reaction with these with sinus drainage and cough.  She has      been better since stopping her ARB.  I am going to avoid higher      doses of amlodipine because of previous problems with swelling.      Hopefully she will not have fatigue or bradycardia with the      Catapres patch.  She will continue to keep her blood pressure      diary.  2. Obesity.  We discussed the need to lose weight with diet and      exercise.  3. Tachycardia.  She has had some atrial tachycardia, but is not      feeling this.  No further evaluation is warranted.   FOLLOWUP:  I will see her back in about six weeks for further medicine  titration.  I will continue to manage her hypertension per her family  request.     Rollene Rotunda, MD, [facility]  Electronically Signed    JH/MedQ  DD: 02/02/2007  DT: 02/02/2007  Job #: [id number]

## 2010-06-25 NOTE — Assessment & Plan Note (Signed)
[name] [name]                            CARDIOLOGY OFFICE NOTE   NAME:[name], [name]                         MRN:          [id number]  DATE:07/15/2007                            DOB:          [birth year]    PRIMARY CARE PHYSICIAN:  None.   REASON FOR PRESENTATION:  Evaluate patient with hypertension.   HISTORY OF PRESENT ILLNESS:  The patient is 77 years old.  She presents  for a follow-up of the above.  She has had problems with a rash since I  last saw her.  She developed hives.  She developed a rash on her left  neck.  She was told the rash on the neck was contacted dermatitis.  She  stopped taking aspirin thinking it was related to the hives.  She  wondered if it could have been some of her medications as well.  Her  family says she gets quite anxious about things like this.  She has been  keeping her blood pressure and when she got hives she started noticing  that her blood pressure was going up.  She has been in the 160s - 180s  systolic with diastolics in the 70s to 90s.  Prior to this she was  better controlled.  She has had no new cardiovascular complaints.  She  denies any chest discomfort, neck or arm discomfort.  She said no  palpitation, presyncope or syncope.  She has had no PND or orthopnea.   PAST MEDICAL HISTORY:  1. Hypertension x 8 years  2. Tremors.  3. Back surgery years ago.   ALLERGIES:  PENICILLIN, SULFA AND CODEINE.   MEDICATIONS:  1. Aspirin 81 mg daily (the patient is not taking it currently).  2. Amlodipine 5 mg daily.  3. Hydrochlorothiazide 25 mg daily.  4. Potassium 10 mEq daily.  5. Clonidine 0.2 mg q.12 hours.   REVIEW OF SYSTEMS:  As stated in the HPI and otherwise negative for  other systems.   PHYSICAL EXAMINATION:  Negative for all other systems.   PHYSICAL EXAMINATION:  The patient is in no distress.  Blood pressure  168/94, heart rate 94 and regular, body mass index 30.  HEENT:  Eyelids unremarkable,  pupils equally round and reactive to  light, fundi not visualized, oral mucosa unremarkable.  NECK:  No jugular venous distention at 45 degrees, carotid upstroke  brisk and symmetrical, no bruits, no thyromegaly.  LYMPHATICS:  No cervical, axillary or inguinal adenopathy.  LUNGS:  Clear to auscultation bilaterally.  BACK:  No costovertebral angle tenderness.  CHEST:  Unremarkable.  HEART:  PMI not displaced or sustained, S1-S2 within normal limits, no  S3-S4, no clicks, no rubs, no murmurs.  ABDOMEN:  Obese, positive bowel sounds normal in frequency and pitch, no  bruits, no rebound, no guarding, no midline pulsatile mass, no  hepatomegaly, splenomegaly.  SKIN:  No rashes, no nodules.  EXTREMITIES:  Two plus pulses, no edema.  NEURO:  Oriented to person, place and time, cranial nerves II-XII  grossly intact, motor grossly intact, resting tremor.   EKG sinus rhythm, rate 94,  axis rightward, intervals within normal  limits, no acute ST-wave changes.   ASSESSMENT/PLAN:  1. Hypertension, blood pressure is not well-controlled.  Part of this      is probably anxiety.  I am going to increase her amlodipine to 7.5      mg daily.  We also discussed means of dealing with her anxiety.  I      am going to take the liberty of giving her Xanax 0.25 mg q.12 hours      p.r.n. to take when her blood pressure is very high and she is      agitated.  I am going to ask her to discuss this with her new      primary care doctor.  This going to be Dr. Fabian Sharp.  Otherwise, she      will continue the other medications as listed and keep the blood      pressure diary.  2. Anxiety as above.  3. Back pain, she has continued back discomfort and will follow up      with Dr. Fabian Sharp about this.  4. Lower extremity edema, the patient does have very mild edema.      However, this is not problematic.  We will keep an eye on this we      increase the amlodipine.  5. Follow-up, will see her back in about 3 months for  her next follow-      up but sooner if she has any problems with her blood pressure.  Rollene Rotunda, MD, [facility]  Electronically Signed    JH/MedQ  DD: 07/15/2007  DT: 07/15/2007  Job #: [id number]   cc:   [name]. Fabian Sharp, MD

## 2010-06-25 NOTE — Assessment & Plan Note (Signed)
[facility]                            CARDIOLOGY OFFICE NOTE   NAME:[name] [name]                       MRN:          [id number]  DATE:04/27/2007                            DOB:          1927-05-13    SUBJECTIVE:  [name] is a 77 year old white female who is following  with Dr. Antoine Poche for hypertension.  Since her last office visit on  March 16, 2007, she states that she has been doing well.  She has been  increasing her activity around the house.  However, she has not been  walking outside.  She has been maintaining her blood pressure diary as  prescribed.  She denies any chest discomfort, shortness of breath or  changes in her pedal edema.  Her husband and daughter are present with  her today.   On review of her blood pressure documentation from February 3 to April 27, 2007, her blood pressure has ranged from a low of 109/72 to a high  of 186/86 on April 26, 2007.  The majority of her blood pressures have  averaged in the 130s.  On review with the patient, it is not clear as to  why her blood pressure became elevated yesterday.  She denied any  specific anxiety or increase in her salt, or missing any of her  medications.  She did state that she had a decaffeinated expresso at her  daughter's house.   PAST MEDICAL HISTORY:  Notable for hypertension for approximately eight  years, resting tremors, multiple back surgeries with associated  peripheral neuropathy.   ALLERGIES:  PENICILLIN, SULFA, CODEINE. ADHESIVE ASSOCIATED WITH  CATAPRES PATCHES.   CURRENT MEDICATIONS:  1. Aspirin 81 daily.  2. Amlodipine 5 daily.  3. Hydrochlorothiazide 25 daily.  4. Klor-Con 10 mEq daily.  5. Clonidine 0.2 b.i.d.   REVIEW OF SYSTEMS:  Essentially unremarkable except as noted above.   PHYSICAL EXAMINATION:  GENERAL:  Well nourished, well developed, obese  white female in no apparent distress.  In the office today, her blood  pressure in the right  is 140/80 and in the left 150/90, pulse is 76 and  regular.  Her weight is 196 pounds, which is down 7 pounds from her last  office visit on March 16, 2007.  Recheck of her blood pressure by me  prior to leaving the office was 132/82 and her pulse was 74 in her left  arm.  EKG in the office today shows normal sinus rhythm, baseline  artifact, normal axis, nonspecific ST-T wave changes, essentially  unchanged from prior EKG on January 20, 2007.  HEENT:  Unremarkable.  NECK:  Supple without thyromegaly, adenopathy, JVD or carotid bruits.  CHEST:  Symmetrical excursion.  I do not appreciate any rales, rhonchi  or wheezes.  HEART:  PMI is not displaced.  Regular rate and rhythm.  Normal S1, S2.  Do not appreciate any murmurs, rubs, clicks or gallops.  SKIN:  Integument appears to be intact.  Skin irritation associated with  Catapres patches is improving per the patient.  She has lower extremity  varicosities.  She  does have support hose.  NEURO:  Unremarkable.   IMPRESSION/PLAN:  1. Hypertension.  Her blood pressure is significantly improved.  Her      elevated blood pressure on the 16th and the morning of the 17th is      unexplained.  However, at this time would not adjust medications      based on just a 24 hour reading.  I have written her refill mail-in      prescriptions for her hydrochlorothiazide and Klor-Con.  2. Continued back discomfort, which is limiting her activity slightly.  3. No further problems with altered mental status.  4. Improved edema.   DISPOSITION:  Dr. Antoine Poche reviewed the patient's history, spoke with  and examined the patient, and agrees with the above.  He has encouraged  her to continue monitoring her blood pressure and wishes to see her  again in approximately three months.  He has also encouraged her for  continued weight loss and to gradually increase activity.  Cardiac rehab  may be a consideration for her to assist with exercise.  However, she   may not qualify for this and may have to independently pay for this if  she continues to show an interest.      Joellyn Rued, PA-C  Electronically Signed      Rollene Rotunda, MD, [facility]  Electronically Signed   EW/MedQ  DD: 04/27/2007  DT: 04/27/2007  Job #: [id number]   cc:   Chales Salmon. Abigail Miyamoto, M.D.

## 2010-06-25 NOTE — Consult Note (Signed)
[name], SOLTERO NO.:  [id number]   MEDICAL RECORD NO.:  [id number]          PATIENT TYPE:  INP   LOCATION:  6737                         FACILITY:  [facility]   PHYSICIAN:  Rollene Rotunda, MD, FACCDATE OF BIRTH:  18-Mar-1927   DATE OF CONSULTATION:  01/12/2007  DATE OF DISCHARGE:                                 CONSULTATION   PRIMARY CONSULTING:  [name] Harvest, MD.   REASON FOR CONSULTATION:  Evaluate patient with altered mental status.   HISTORY OF PRESENT ILLNESS:  The patient is a pleasant 77 year old white  female with a past history of hypertension for several years.  She has  been treated for 7 to 8 years.  She has otherwise done fairly well.  She  was in her usual state of health until 2 days ago.  On November 30 she  went to church.  She apparently was able to do her church activities;  however, she does not remember any of the events of that day.  She does  not remember being at home.  She had some trouble when she did get home,  apparently opening her front door.  She had some trouble speaking.  Because of this, she was brought to the emergency room.  There she was  noted to have blood pressures of 191/116.  She has had a workup that has  included a CT which demonstrated no acute intracranial events.  There  were some lacunar infarcts and some atrophy.  MRI of the brain was  normal.  Carotid Dopplers demonstrated no stenosis.  Since being  admitted to the room, the patient has been back at her baseline.  She  has had no further mental status problems.  She had never had any visual  disturbances or motor disturbances.  She denies any chest discomfort,  neck or arm discomfort.  She has not felt any palpitations, no  presyncope or syncope.  She has been on telemetry and has been noted to  have runs of atrial tachycardia though she is not feeling this.   In retrospect, the patient had been well.  She does know that her  blood  pressure was slightly  elevated at her last office visit with her  primary care doctor.  She does not check it at home.  She has been  taking her medications.  She does note that she has had some rhinitis  and cough, she relates to the timing of starting her Avapro.   PAST MEDICAL HISTORY:  Hypertension x 7 to 8 years, tremors (she thinks  they are familial  though they have not been evaluated).   PAST SURGICAL HISTORY:  Back surgery years ago.   ALLERGIES:  PENICILLIN, SULFA QUESTIONABLY, AND CODEINE.   CURRENT MEDICATIONS:  (At home) Benicar 20 mg daily, Toprol XL 25 mg  daily.   SOCIAL HISTORY:  The patient lives in [place] with her husband.  She  is married and has children.  She drinks of 1 to 2 cups of coffee a day.  She does not drink alcohol and does not smoke cigarettes.  FAMILY HISTORY:  Noncontributory for early coronary artery disease.   REVIEW OF SYSTEMS:  Positive for neuritis in her right leg related to  her back pain.  Otherwise as stated in the HPI and negative for other  systems.   PHYSICAL EXAMINATION:  GENERAL:  The patient is well-appearing and in no  distress.  VITAL SIGNS:  Blood pressure 115/74, heart rate 56 and regular,  temperature 97.1, respiratory rate 18, 94% saturation on room air.  HEENT:  Eyes are unremarkable.  Pupils are equal, round and reactive.  Fundi not visualized, oral mucosa unremarkable.  NECK:  No jugular venous distention at 45 degrees, carotid upstroke  brisk and symmetrical.  No bruits, thyromegaly.  LYMPHATICS:  No cervical, axillary, inguinal adenopathy.  LUNGS:  Clear to auscultation bilaterally.  BACK:  No costovertebral angle tenderness.  CHEST:  Unremarkable.  HEART:  PMI not displaced or sustained.  S1-S2 within normal limits.  No  S3, no S4, clicks, rubs, murmurs.  ABDOMEN:  Obese, positive bowel sounds, normal in frequency and pitch.  No bruits, rebound, guarding or midline pulsatile mass.  No  hepatomegaly.  No splenomegaly.  SKIN:  No  rashes.  EXTREMITIES:  There are 2+ pulses throughout.  No edema, cyanosis or  clubbing.  NEUROLOGIC:  Oriented to person, place and time.  Cranial nerves II-XII  grossly intact.  Motor grossly intact.   EKG sinus rhythm with paroxysmal atrial tachycardia, axis within normal  limits, intervals within normal limits, no acute ST/T wave change.   LABORATORIES:  Sodium 138, potassium 4.0, BUN 9, creatinine 0.71, WBC  5.9, hemoglobin 13.5.  TSH 3.309.   ASSESSMENT/PLAN:  1. Altered mental status.  This is most likely related to her      significantly elevated blood pressures at the time.  She has had      workup that has been negative for any acute neurologic events.      This could have been a TIA as well.  At this point, we will perform      an echo as an outpatient.  This will evaluate for end-organ effects      of hypertension as well as any source of thromboembolism although I      do not strongly suspect this.  She should be at least on an      aspirin.  She says she has had some difficulty taking this in the      past, but I think she could tolerate 81 mg of a coated aspirin.      Other therapy will center around controlling her blood pressure as      below.  2. Hypertension.  Blood pressure has been above targets.  I do note      that she has had the cough and rhinitis-type symptoms since      starting Avapro.  This may be truly related.  Therefore, I am going      to stop the Avapro.  I am going to add a low-dose diuretic,      hydrochlorothiazide 12.5 mg daily.  I am going to pick Norvasc 5 mg      daily and continue the beta blocker.  We will titrate her meds      according to response to this.  She had been given instructions on      therapeutic lifestyle changes (TLC).  She has been given      instructions on keeping a blood pressure diary.  3.  Obesity.  We will get her to lose weight with diet, exercise, and I      prescribed the [facility] Diet.  4. Tachycardia.  The  patient has had some atrial tachycardia but this      is not particularly symptomatic and I do not think it is related.      She will continue on a low-dose beta blocker.   FOLLOWUP:  I will see her back in the office next week with an  echocardiogram and evaluation of her blood pressures.     Rollene Rotunda, MD, [facility]  Electronically Signed    JH/MEDQ  D:  01/12/2007  T:  01/12/2007  Job:  [id number]   cc:   [name] Harvest, MD

## 2010-06-25 NOTE — Assessment & Plan Note (Signed)
[facility]                            CARDIOLOGY OFFICE NOTE   NAME:POWELLEllisa, Devivo                         MRN:          [id number]  DATE:03/16/2007                            DOB:          Sep 14, 1927    PRIMARY:  Chales Salmon. Abigail Miyamoto, M.D.   REASON FOR PRESENTATION:  A patient with hypertension.   HISTORY OF PRESENT ILLNESS:  The patient is a pleasant 77 year old white  female.  I have been seeing her for management of her hypertension.  At  the last visit I added a Catapres patch #1.  She did well with this.  She certainly is not having any of the cough that she had when she was  on ARBs.  She has blood pressures that range typically in about the  140s.  The range, again, is as high as 170 and as low as 120.  There is  not a wide fluctuation, and we are almost at target.  Her pulse is in  the 40s to 50s.  She denies any presyncope or syncope.  She denies any  chest discomfort, neck or arm discomfort.  She has no shortness of  breath, PND, or orthopnea.  She has had less lower extremity swelling  than she has had in the past.  She does move in slow motion according  to her husband.   PAST MEDICAL HISTORY:  1. Hypertension x 7 to 8 years.  2. Tremors.  3. Back surgery several years ago.   ALLERGIES:  PENICILLIN, SULFA, and CODEINE.   MEDICATIONS:  1. Aspirin 81 mg daily.  2. Amlodipine 5 mg daily.  3. Hydrochlorothiazide 25 mg daily.  4. Potassium 10 mEq daily.  5. Metoprolol 25 mg b.i.d.  6. Catapres patch #1.   REVIEW OF SYSTEMS:  As stated in the HPI, otherwise negative for any  other systems.   PHYSICAL EXAMINATION:  The patient is in no distress.  Blood pressure 174/81, heart rate 58 and regular, weight 203 pounds (she  has lost 6 pounds).  HEENT:  Eyelids unremarkable, pupils equal, round, and reactive to  light.  Fundi not visualized.  Oral mucosa unremarkable.  NECK:  No jugular distention to 45 degrees, carotid upstroke brisk  and  symmetric, no bruits, or thyromegaly.  LYMPHATICS:  No cervical, axillary, or inguinal adenopathy.  LUNGS:  Clear to auscultation bilaterally.  BACK:  No costovertebral tenderness.  CHEST:  Unremarkable.  HEART:  PMI not displaced or sustained.  S1-S2 within normal limits, no  S3-S4, no clicks, no rubs, no murmurs.  ABDOMEN:  Obese, positive bowel sounds, normal frequency pitch, no  bruits, no rebound, no guarding or midline pulsatile mass. no  hepatomegaly, no splenomegaly.  SKIN:  No rashes.  EXTREMITIES:  2+ pulses throughout, no edema, no cyanosis, or clubbing.  NEURO:  Oriented to person, place, and time.  Cranial nerves II-XII  grossly intact, motor grossly intact.   ASSESSMENT AND PLAN:  1. Hypertension.  Blood pressure is still not at target.  She is      bradycardic and a little sluggish.  Therefore,  I am going to stop      the metoprolol.  I am going to change to a Catapres patch #2.  She      will continue the other medications as listed.  2. Back pain.  This continues and limits her to some degree.  3. Altered mental status.  This may have been related to hypertensive      urgency.  She has none of these spells since I last saw her.  4. Edema, this is improved.  She does have some baseline edema, but      there is no worsening of this.   FOLLOWUP:  I will see the patient, again, in about 6 weeks to see if  further med titration is necessary.     Rollene Rotunda, MD, [facility]  Electronically Signed    JH/MedQ  DD: 03/16/2007  DT: 03/17/2007  Job #: [id number]   cc:   Chales Salmon. Abigail Miyamoto, M.D.

## 2010-06-25 NOTE — Assessment & Plan Note (Signed)
[facility]                            CARDIOLOGY OFFICE NOTE   NAME:POWELLKiri, Hinderliter                         MRN:          [id number]  DATE:11/26/2007                            DOB:          21-Sep-1927    PRIMARY CARE PHYSICIAN:  [name]. Panosh, MD   REASON FOR PRESENTATION:  Evaluate the patient with hypertension.   HISTORY OF PRESENT ILLNESS:  The patient is 77 years old.  She returns  for followup of the above.  She talks about having multiple kinds of  rashes.  She talks about welts as well as a rash in her face and around  her waistband.  Her dermatologist thought this was a contact dermatitis  and treated her with some topical steroids.  He was not convinced that  this is related to her meds, though she now understands that there is  some sulfa and hydrochlorothiazide and wonders if this could be related.  Of importance, she stopped taking potassium supplements because they  upset her stomach.  She has not had her blood work checked since May.  Today, when we put the EKG on her, she was having a tachyarrhythmia that  looked to be irregular and possibly ectopic atrial tachycardia.  She is  not feeling any palpitation, had no presyncope or syncope.  She has had  no chest discomfort, neck, or arm discomfort.  She has had no new  shortness of breath.  Denies any PND or orthopnea.  She has been keeping  a blood pressure diary and is consistently above 140, often times at 150  systolic, once up to 180s.  The diastolic is controlled.   PAST MEDICAL HISTORY:  1. Hypertension x 8 years.  2. Tremors.  3. Back surgery years ago.   ALLERGIES:  1. PENICILLIN.  2. SULFA.  3. CODEINE.   MEDICATIONS:  1. Amlodipine 7.5 mg daily.  2. Clonidine 0.3 mg q.12 h.  3. Hydrochlorothiazide 25 mg daily.   REVIEW OF SYSTEMS:  As stated in the HPI and otherwise negative for all  other systems.   PHYSICAL EXAMINATION:  GENERAL:  The patient is in no  distress.  VITAL SIGNS:  Blood pressure 154/83, heart rate 108 and irregular,  weight 177 pounds, and body mass index 29.  HEENT:  Eyes are unremarkable.  Pupils are equal, round, and reactive to  light, fundi not visualized, oral mucosa unremarkable.  NECK:  No  jugular venous distention at 45 degrees, carotid upstroke brisk and  symmetric, no bruits, no thyromegaly.  LYMPHATICS:  No cervical, axillary, or inguinal adenopathy.  LUNGS:  Clear to auscultation bilaterally.  BACK:  No costovertebral angle tenderness.  CHEST:  Unremarkable.  HEART:  PMI not displaced or sustained, S1 and S2 within normal.  No S3,  no S4, no clicks, no rubs, and no murmurs.  ABDOMEN:  Obese, positive  bowel sounds.  Normal in frequency and pitch, no bruits, no rebound, no  guarding or midline pulsatile mass.  No hepatomegaly or splenomegaly.  SKIN:  No rashes, no nodules.  EXTREMITIES:  Pulses are 2+  throughout, no edema, no cyanosis or  clubbing.  NEURO:  Oriented to person, place, and time, cranial nerves II-XII are  grossly intact, motor grossly intact throughout.   EKG as described above.  The axis is within normal limits, intervals  within normal limits, nonspecific lateral T-wave changes.   ASSESSMENT AND PLAN:  1. Hypertension.  Blood pressure is still not well controlled.  I am      afraid that she is not going to tolerate the hydrochlorothiazide      since she cannot take potassium and I also I can find a form that      she can take.  I am going to check a BMET today and if it is low, I      will tell her to stop her hydrochlorothiazide.  I am going to go up      on the amlodipine 10 mg daily.  She is going to continue the      clonidine.  We will work to try to find a regimen that she      tolerates, though she has been sensitive to BETA-BLOCKERS and has      other drug reactions.  2. Rash.  I do not know this is drug related and we will defer to her      a dermatologist.  3. Arrhythmia.  The  patient had an arrhythmia as described.  I am      going to place a 48-hour Holter, check a TSH and a BMET as well as      magnesium.  She is not having any symptoms related to this.  4. Tremors, these are unchanged.  5. Followup.  I will see her back in about 2 months or sooner if      needed.     Rollene Rotunda, MD, [facility]  Electronically Signed    JH/MedQ  DD: 11/26/2007  DT: 11/27/2007  Job #: [id number]   cc:   [name]. Fabian Sharp, MD

## 2010-06-25 NOTE — Assessment & Plan Note (Signed)
[facility]                            CARDIOLOGY OFFICE NOTE   NAME:POWELLBritini, Garcilazo                         MRN:          [id number]  DATE:01/24/2008                            DOB:          05-Nov-1927    PRIMARY CARE PHYSICIAN:  [name]. Panosh, MD   REASON FOR PRESENTATION:  Evaluate the patient's hypertension and  bradycardia.   HISTORY OF PRESENT ILLNESS:  The patient presents for followup of the  above.  At the last visit, I added spironolactone to her medical  regimen.  She did develop a little bit of a rash with this.  However,  she says this is not particularly bothering her.  It is mostly on her  upper chest and slightly on her face.  She has had lab work done with  the most recently been a couple weeks ago.  The potassium was fine.  She  has been keeping blood pressure sporadically.  It has been in the 160s  systolic to 140s.  The diastolic is controlled.  She is not noticing any  palpitations, presyncope, or syncope.  She did have the Holter monitor  demonstrating some bradyarrhythmias and also some atrial  tachyarrhythmias.  She has not had any presyncope or syncope associated  with this, however.  She had no chest pressure, neck, or arm discomfort.   PAST MEDICAL HISTORY:  Hypertension difficult to control x 8 years,  tremors, and back surgery years ago.   ALLERGIES:  PENICILLIN, SULFA, and CODEINE.   MEDICATIONS:  1. Clonidine 0.3 mg q.12 h.  2. Amlodipine 10 mg.  3. Spironolactone 25 mg daily.   REVIEW OF SYSTEMS:  As stated in the HPI and otherwise negative for  other systems.   PHYSICAL EXAMINATION:  GENERAL:  The patient is in no distress.  VITAL SIGNS:  Blood pressure 160/88, heart rate 98 and regular.  HEENT:  Eyes are unremarkable; pupils equal, round, and reactive to  light; fundi not visualized; oral mucosa unremarkable.  NECK:  No jugular venous distention at 45 degrees; carotid upstroke  brisk and symmetric; no  bruits, no thyromegaly.  LYMPHATICS:  No cervical, axillary, or inguinal adenopathy.  LUNGS:  Clear to auscultation bilaterally.  BACK:  No costovertebral angle tenderness.  CHEST:  Unremarkable.  HEART:  PMI not displaced or sustained; S1 and S2 within normal limits;  no S3, no S4; no clicks, no rubs, no murmurs.  ABDOMEN:  Obese; positive bowel sounds normal in frequency and pitch; no  bruits, no rebound, no guarding; no midline pulsatile mass; no  hepatomegaly, no splenomegaly.  SKIN:  No rashes, no nodules.  EXTREMITIES:  A 2+ pulses throughout; no edema, no cyanosis, no  clubbing.  NEURO:  Oriented to person, place, and time; cranial nerves II through  XII are grossly intact; motor grossly intact.   ASSESSMENT AND PLAN:  1. Hypertension.  Blood pressure is still not well controlled.  She is      tolerating amlodipine with a slight rash.  At this point, I think,      the most prudent step  is to increase this to 50 mg daily.  If the      rash gets worse, she will let me know.  Have her come back in 2      weeks for a basic metabolic profile to follow up of this change.  2. Palpitations.  The patient has some tachypalpitations, though I      have not seen evidence of atrial fibrillation.  She had some brady      episodes, but no symptoms related to this.  At this point, we will      continue to manage this expectantly.  3. Tremors.  This is unchanged.  4. Confusion.  The patient had episodic confusion in the past.  This      was how I first met her.  This was a transient global amnesia, but      she has had no further episodes of this.  5. Followup.  I will see her back in about 2 months or sooner based on      home blood pressure readings.     Rollene Rotunda, MD, [facility]  Electronically Signed    JH/MedQ  DD: 01/24/2008  DT: 01/25/2008  Job #: [id number]   cc:   [name]. Fabian Sharp, MD

## 2010-06-28 NOTE — Discharge Summary (Signed)
[name], [name] NO.:  [id number]   MEDICAL RECORD NO.:  [id number]          PATIENT TYPE:  INP   LOCATION:  6737                         FACILITY:  [facility]   PHYSICIAN:  Ramiro Harvest, MD    DATE OF BIRTH:  1927/04/14   DATE OF ADMISSION:  01/10/2007  DATE OF DISCHARGE:  01/12/2007                               DISCHARGE SUMMARY   DISCHARGE DIAGNOSES:  1. Transient global amnesia/altered mental status.  2. Labile hypertension.  3. Seasonal allergies.  4. Obesity.   DISCHARGE MEDICATIONS:  1. Hydrochlorothiazide 12.5 mg p.o. daily  2. Norvasc p.o. daily  3. Toprol XL 25 mg p.o. daily  4. Daily aspirin 81 mg daily.   DISPOSITION AND FOLLOWUP:  The patient is to have followup with Dr.  Antoine Poche in one week on January 20, 2007 at 2 p.m. for further  management and evaluation of the patient's hypertension, and at which  time a 2D echocardiogram will be obtained.  The patient is also to call  to schedule a followup appointment with her primary care physician in 2  weeks.   CONSULTATIONS:  A cardiology consult was done on January 12, 2007.  The  patient was seen by [facility] cardiologist Dr. Antoine Poche.   PROCEDURES:  1. CT of the head without contrast was performed on January 10, 2007      which showed no intracranial hemorrhage, mass effect or midline      shift, mild cerebral atrophy.  There is a 7 mm lacunar infarct in      the right basal ganglia.  2. MRI/MRA of the brain was done on January 11, 2007 which showed no      acute abnormality.  MRA of the head was also negative.  3. Carotid Dopplers were also done on January 11, 2007 which showed      vertebral artery flow antegrade bilaterally, no significant right      ICA stenosis noted, no significant left ICA stenosis noted.   BRIEF ADMISSION HISTORY AND PHYSICAL:  [name] [name] is a 77 year old  white female, past medical history significant for hypertension, who  presented to the ED after an  episode of confusion and reported amnesia  on the day of admission.  On the morning of the admission, the patient  had reported going to church and was in her usual state of health,  however when she came home she had difficulty opening her front door and  using her keys.  Her husband came to the door and opened it up for her.  When she entered, she began asking random questions to her husband.  He  stated these were coherent sentences, however the husband reports that  the patient did not answered these questions and her behavior was  somewhat bizarre.  The patient remembers none of that behavior.  The  patient was brought to the ED for further evaluation where she was found  to have an elevated blood pressure of 186/99, was treated medically and  her blood pressure responded.  With an improvement in her blood  pressure, the patient's  mental status also improved.  The patient denied  any headaches, visual changes, slurred speech, numbness, weakness or  tingling. Additionally, the patient denies fevers, chills, chest pain,  shortness of breath, cough, or genitourinary symptoms.   PHYSICAL EXAMINATION:  Temperature 97.8, blood pressure 122/68.  Her  pulse was 66, respirations 20.  IN GENERAL:  The patient was in no acute distress.  HEENT:  Normocephalic and atraumatic.  Oropharynx was pink, moist, no  lesions.  NECK:  No carotid bruits, no lymphadenopathy, no thyroid masses.  CARDIOVASCULAR:  Regular rate and rhythm, no murmurs, rubs or gallops.  RESPIRATORY:  Lungs are Clear to auscultation bilaterally.  ABDOMEN:  Soft, nontender, nondistended.  Positive bowel sounds.  EXTREMITIES:  No cyanosis, clubbing, 1+ lower extremity edema with  palpable dorsalis pedis pulses.  SKIN:  No rashes.  BACK:  No CVA tenderness.  NEUROLOGIC:  The patient was alert and oriented x3.  Cranial nerves II-  XII are grossly intact.  No focal deficits.  Muscle strength was 5/5  bilateral upper extremities and  bilateral lower extremities, and  sensation was intact.   ADMISSION LABS:  CBC, white count of 8.8, hemoglobin 14.8, hematocrit  44.9, platelets of 306, ANC of 7.4.  Comprehensive metabolic profile,  sodium 137, potassium 4.3, chloride 106, bicarb 23, BUN 11, creatinine  0.81 and glucose of 124.  Bilirubin of 0.9, alkaline phosphatase 70, AST  27, ALT 21, total protein 6.7, albumin 3.7, and calcium of 9.0.  Initial  cardiac enzymes CK 162, CK-MB 3.1, troponin of 0.02.  Lipid profile  showed a cholesterol of 236, triglycerides  of 132, HDL of 42, LDL of  168.  TSH of 3.309.  B12 of 161 and folate of 70.6.   HOSPITAL COURSE:  1. Transient global amnesia/altered mental status.  Questionable      etiology.  It felt it could have been secondary to the patient's      hypertension versus a TIA.  The patient's symptoms resolved rapidly      within 24 hours of admission.  Full stroke workup was done which      essentially was negative.  Lab work was negative.  Cardiac enzymes      were negative.  Carotid Dopplers were done with results as stated      above.  The patient had clinically improved and was found back to      her baseline within 24 hours of admission.  The patient was placed      on aspirin during the hospitalization.  MRI/MRA of the head was      also obtained which was negative, with results as stated above.  A      CT was also obtained with results as stated above.  The patient was      clinically improved and back to her baseline by day of discharge.      The patient was discharged in stable and improved condition.  2. Labile high blood pressure.  The patient initially presented with      an elevated blood pressure.  The patient was put on beta blocker      during hospitalization.  Her pressures improved and per patient's      family had strongly requested a cardiology consult for further      management of the patient's hypertension.  Cardiology consult was      obtained.  The  patient was seen by Dr. Antoine Poche on January 12, 2007.  Further recommendations were made per Dr. Antoine Poche for the      patient's blood pressure control.  The patient was to follow up      with Dr. Antoine Poche on January 20, 2007, where a 2D echocardiogram      will be obtained in Dr. Jenene Slicker office and further management of      the patient's blood pressure.  The patient was discharged home on:   1. HCTZ 12.5 mg daily  2. Norvasc 5 mg daily  3. Toprol XL 25 mg daily   The patient was discharged in stable and improved condition to follow up  with cardiology.   1. Seasonal allergies stable.  2. Obesity stable.   On day of discharge the patient was in stable and improved condition and  back to her baseline.   VITAL SIGNS ON DISCHARGE:  Temperature 97.1, pulse of 56, respirations  20, blood pressure 115/74, satting 94% on room air.   DISCHARGE LABS:  CBC, white count 5.9, hemoglobin 13.5, hematocrit 39.5,  platelet count of 296.  Basic metabolic panel sodium 138, potassium 4.0,  chloride 104, bicarb 28, BUN 9, creatinine 0.71, glucose of 104, calcium  of 8.6.   It has been a pleasure in taking care of [name] [name].      Ramiro Harvest, MD  Electronically Signed     DT/MEDQ  D:  02/05/2007  T:  02/05/2007  Job:  [id number]   cc:   Chales Salmon. Abigail Miyamoto, M.D.  Rollene Rotunda, MD, [facility]

## 2010-08-09 ENCOUNTER — Telehealth: Payer: Self-pay | Admitting: Cardiology

## 2010-08-09 NOTE — Telephone Encounter (Signed)
Pt needs refill on Clonidine 0.3mg  bid /// Spironolact 50mg  qd /// metoprolol 25mg  qd called [facility]

## 2010-08-11 MED ORDER — CLONIDINE HCL 0.3 MG PO TABS: 0.3000 mg | ORAL_TABLET | Freq: Two times a day (BID) | ORAL | Status: DC

## 2010-08-11 MED ORDER — METOPROLOL TARTRATE 25 MG PO TABS: 25.0000 mg | ORAL_TABLET | Freq: Two times a day (BID) | ORAL | Status: DC

## 2010-08-11 MED ORDER — SPIRONOLACTONE 50 MG PO TABS: 50.0000 mg | ORAL_TABLET | Freq: Every day | ORAL | Status: DC

## 2010-08-29 ENCOUNTER — Encounter: Payer: Self-pay | Admitting: Cardiology

## 2010-10-01 ENCOUNTER — Ambulatory Visit: Payer: MEDICARE | Admitting: Cardiology

## 2010-10-24 ENCOUNTER — Encounter: Payer: Self-pay | Admitting: Cardiology

## 2010-10-24 ENCOUNTER — Ambulatory Visit (INDEPENDENT_AMBULATORY_CARE_PROVIDER_SITE_OTHER): Payer: MEDICARE | Admitting: Cardiology

## 2010-10-24 DIAGNOSIS — I10 Essential (primary) hypertension: Secondary | ICD-10-CM

## 2010-10-24 DIAGNOSIS — I499 Cardiac arrhythmia, unspecified: Secondary | ICD-10-CM

## 2010-10-24 DIAGNOSIS — R002 Palpitations: Secondary | ICD-10-CM

## 2010-10-24 NOTE — Patient Instructions (Signed)
Follow up in 6 months with Dr Hochrein.  You will receive a letter in the mail 2 months before you are due.  Please call us when you receive this letter to schedule your follow up appointment.   The current medical regimen is effective;  continue present plan and medications.  

## 2010-10-24 NOTE — Progress Notes (Signed)
HPI The patient presents for follow up of HTN.  Since I last saw her she has done well. The patient denies any new symptoms such as chest discomfort, neck or arm discomfort. There has been no new shortness of breath, PND or orthopnea. There have been no reported palpitations, presyncope or syncope.  She has had none of the previous  Episodes of unresponsiveness that she had in the past.  Allergies  Allergen Reactions  . Aspirin   . Codeine   . Hydrochlorothiazide   . Penicillins   . Sulfonamide Derivatives     Current Outpatient Prescriptions  Medication Sig Dispense Refill  . amLODipine (NORVASC) 10 MG tablet Take 10 mg by mouth daily.        . cloNIDine (CATAPRES) 0.3 MG tablet Take 1 tablet (0.3 mg total) by mouth 2 (two) times daily.  180 tablet  1  . metoprolol tartrate (LOPRESSOR) 25 MG tablet Take 1 tablet (25 mg total) by mouth 2 (two) times daily.  180 tablet  1  . spironolactone (ALDACTONE) 50 MG tablet Take 1 tablet (50 mg total) by mouth daily.  90 tablet  1    Past Medical History  Diagnosis Date  . HTN (hypertension)   . Tremors of nervous system     benign familial  . Transient global amnesia     AMS  . Atrial tachycardia   . Jaundice due to hepatitis     at age 15 or 26    Past Surgical History  Procedure Date  . Back surgery     years ago  . Abdominal hernia repair   . Tonsillectomy     ROS:  As stated in the HPI and negative for all other systems.  PHYSICAL EXAM BP 132/86  Pulse 57  Resp 16  Ht 5\' 3"  (1.6 m)  Wt 170 lb (77.111 kg)  BMI 30.11 kg/m2 GENERAL:  Well appearing HEENT:  Pupils equal round and reactive, fundi not visualized, oral mucosa unremarkable NECK:  No jugular venous distention, waveform within normal limits, carotid upstroke brisk and symmetric, no bruits, no thyromegaly LYMPHATICS:  No cervical, inguinal adenopathy LUNGS:  Clear to auscultation bilaterally BACK:  No CVA tenderness CHEST:  Unremarkable HEART:  PMI not displaced  or sustained,S1 and S2 within normal limits, no S3, no S4, no clicks, no rubs, no murmurs ABD:  Flat, positive bowel sounds normal in frequency in pitch, no bruits, no rebound, no guarding, no midline pulsatile mass, no hepatomegaly, no splenomegaly EXT:  2 plus pulses throughout, no edema, no cyanosis no clubbing SKIN:  No rashes no nodules NEURO:  Cranial nerves II through XII grossly intact, motor grossly intact throughout, resting tremor PSYCH:  Cognitively intact, oriented to person place and time  EKG:  Sinus bradycardia, rate 57, low voltage, poor anterior R-wave progression, premature atrial contractions  ASSESSMENT AND PLAN

## 2010-10-24 NOTE — Assessment & Plan Note (Signed)
The blood pressure is at target. No change in medications is indicated. We will continue with therapeutic lifestyle changes (TLC).  

## 2010-10-24 NOTE — Assessment & Plan Note (Signed)
She is no longer having these.  No change in therapy is indicated.

## 2010-11-18 LAB — CBC
HCT: 39.5
Hemoglobin: 13.5
MCHC: 34.1
MCV: 88.1
Platelets: 296
RBC: 4.49
RDW: 13

## 2010-11-18 LAB — CARDIAC PANEL(CRET KIN+CKTOT+MB+TROPI)
Relative Index: 1.8
Troponin I: 0.02

## 2010-11-18 LAB — BASIC METABOLIC PANEL
BUN: 9
CO2: 28
Calcium: 8.6
GFR calc non Af Amer: >60
Glucose, Bld: 104 — ABNORMAL HIGH

## 2010-11-19 LAB — COMPREHENSIVE METABOLIC PANEL
ALT: 21
AST: 27
Albumin: 3.7
Alkaline Phosphatase: 70
Calcium: 9
Chloride: 106
Creatinine, Ser: 0.81
GFR calc Af Amer: >60
Sodium: 137

## 2010-11-19 LAB — CBC
MCHC: 33.7
RBC: 4.99
RDW: 12.6

## 2010-11-19 LAB — DIFFERENTIAL
Eosinophils Absolute: 0 — ABNORMAL LOW
Eosinophils Relative: 0
Lymphs Abs: 1
Monocytes Relative: 3

## 2010-11-19 LAB — LIPID PANEL: LDL Cholesterol: 168 — ABNORMAL HIGH

## 2010-11-19 LAB — TSH: TSH: 3.309

## 2010-11-22 ENCOUNTER — Other Ambulatory Visit: Payer: Self-pay

## 2010-11-22 MED ORDER — AMLODIPINE BESYLATE 10 MG PO TABS: 10.0000 mg | ORAL_TABLET | Freq: Every day | ORAL | Status: DC

## 2010-11-25 ENCOUNTER — Other Ambulatory Visit: Payer: Self-pay

## 2010-11-25 MED ORDER — AMLODIPINE BESYLATE 10 MG PO TABS: 10.0000 mg | ORAL_TABLET | Freq: Every day | ORAL | Status: DC

## 2011-02-12 ENCOUNTER — Other Ambulatory Visit: Payer: Self-pay | Admitting: Cardiology

## 2011-03-13 ENCOUNTER — Other Ambulatory Visit: Payer: Self-pay | Admitting: Cardiology

## 2011-03-14 MED ORDER — METOPROLOL TARTRATE 25 MG PO TABS: 25.0000 mg | ORAL_TABLET | Freq: Two times a day (BID) | ORAL | Status: DC

## 2011-03-14 MED ORDER — SPIRONOLACTONE 50 MG PO TABS: 50.0000 mg | ORAL_TABLET | Freq: Every day | ORAL | Status: DC

## 2011-04-22 ENCOUNTER — Encounter: Payer: Self-pay | Admitting: Cardiology

## 2011-04-22 ENCOUNTER — Ambulatory Visit (INDEPENDENT_AMBULATORY_CARE_PROVIDER_SITE_OTHER): Payer: MEDICARE | Admitting: Cardiology

## 2011-04-22 DIAGNOSIS — I10 Essential (primary) hypertension: Secondary | ICD-10-CM | POA: Diagnosis not present

## 2011-04-22 DIAGNOSIS — R002 Palpitations: Secondary | ICD-10-CM

## 2011-04-22 LAB — CBC WITH DIFFERENTIAL/PLATELET
Basophils Relative: 2.7 % (ref 0.0–3.0)
Eosinophils Relative: 0.6 % (ref 0.0–5.0)
HCT: 43.6 % (ref 36.0–46.0)
Hemoglobin: 14.6 g/dL (ref 12.0–15.0)
Lymphs Abs: 1.4 10*3/uL (ref 0.7–4.0)
MCV: 90.1 fl (ref 78.0–100.0)
Monocytes Absolute: 0.7 10*3/uL (ref 0.1–1.0)
Neutro Abs: 4.5 10*3/uL (ref 1.4–7.7)
RBC: 4.84 Mil/uL (ref 3.87–5.11)
WBC: 6.9 10*3/uL (ref 4.5–10.5)

## 2011-04-22 NOTE — Assessment & Plan Note (Signed)
She does not notice this.  No change in therapy is indicated.

## 2011-04-22 NOTE — Patient Instructions (Signed)
Please have lab work today  The current medical regimen is effective;  continue present plan and medications.  Follow up in 6 months with Dr Hochrein.  You will receive a letter in the mail 2 months before you are due.  Please call us when you receive this letter to schedule your follow up appointment.  

## 2011-04-22 NOTE — Assessment & Plan Note (Signed)
Her blood pressure is slightly higher today but her diary (though it is few data points) is WNL.  She will continue the medications as listed.

## 2011-04-22 NOTE — Progress Notes (Signed)
   HPI The patient presents for follow up of HTN.  Since I last saw her she has done well. The patient denies any new symptoms such as chest discomfort, neck or arm discomfort. There has been no new shortness of breath, PND or orthopnea. She does not notice palpitations.  She does her activities of daily living and some exercising.  Allergies  Allergen Reactions  . Aspirin   . Codeine   . Hydrochlorothiazide   . Penicillins   . Sulfonamide Derivatives     Current Outpatient Prescriptions  Medication Sig Dispense Refill  . amLODipine (NORVASC) 10 MG tablet Take 1 tablet (10 mg total) by mouth daily.  90 tablet  3  . cloNIDine (CATAPRES) 0.3 MG tablet TAKE 1 TABLET TWICE A DAY  180 tablet  3  . metoprolol tartrate (LOPRESSOR) 25 MG tablet Take 1 tablet (25 mg total) by mouth 2 (two) times daily.  180 tablet  1  . spironolactone (ALDACTONE) 50 MG tablet Take 1 tablet (50 mg total) by mouth daily.  90 tablet  1    Past Medical History  Diagnosis Date  . HTN (hypertension)   . Tremors of nervous system     benign familial  . Transient global amnesia     AMS  . Atrial tachycardia   . Jaundice due to hepatitis     at age 78 or 78    Past Surgical History  Procedure Date  . Back surgery     years ago  . Abdominal hernia repair   . Tonsillectomy     ROS:  As stated in the HPI and negative for all other systems.  PHYSICAL EXAM BP 150/75  Pulse 63  Ht 5\' 4"  (1.626 m)  Wt 170 lb (77.111 kg)  BMI 29.18 kg/m2 GENERAL:  Well appearing NECK:  No jugular venous distention, waveform within normal limits, carotid upstroke brisk and symmetric, no bruits, no thyromegaly LYMPHATICS:  No cervical, inguinal adenopathy LUNGS:  Clear to auscultation bilaterally BACK:  No CVA tenderness CHEST:  Unremarkable HEART:  PMI not displaced or sustained,S1 and S2 within normal limits, no S3, no S4, no clicks, no rubs, no murmurs ABD:  Flat, positive bowel sounds normal in frequency in pitch, no  bruits, no rebound, no guarding, no midline pulsatile mass, no hepatomegaly, no splenomegaly EXT:  2 plus pulses throughout, no edema, no cyanosis no clubbing  EKG:  NSR, frequent PACs, no acute ST T wave changes.  ASSESSMENT AND PLAN

## 2011-04-25 ENCOUNTER — Encounter: Payer: Self-pay | Admitting: *Deleted

## 2011-07-03 DIAGNOSIS — H40019 Open angle with borderline findings, low risk, unspecified eye: Secondary | ICD-10-CM | POA: Diagnosis not present

## 2011-09-01 ENCOUNTER — Encounter: Payer: Self-pay | Admitting: Physician Assistant

## 2011-09-01 ENCOUNTER — Ambulatory Visit (INDEPENDENT_AMBULATORY_CARE_PROVIDER_SITE_OTHER): Payer: MEDICARE | Admitting: Physician Assistant

## 2011-09-01 VITALS — BP 144/58 | HR 65 | Ht 64.0 in | Wt 174.0 lb

## 2011-09-01 DIAGNOSIS — R609 Edema, unspecified: Secondary | ICD-10-CM | POA: Diagnosis not present

## 2011-09-01 DIAGNOSIS — I10 Essential (primary) hypertension: Secondary | ICD-10-CM | POA: Diagnosis not present

## 2011-09-01 DIAGNOSIS — R0602 Shortness of breath: Secondary | ICD-10-CM

## 2011-09-01 DIAGNOSIS — R002 Palpitations: Secondary | ICD-10-CM | POA: Diagnosis not present

## 2011-09-01 LAB — HEPATIC FUNCTION PANEL
Bilirubin, Direct: 0 mg/dL (ref 0.0–0.3)
Total Protein: 8 g/dL (ref 6.0–8.3)

## 2011-09-01 LAB — BASIC METABOLIC PANEL
GFR: 61.38 mL/min (ref >60.00)
Glucose, Bld: 113 mg/dL — ABNORMAL HIGH (ref 70–99)
Potassium: 4.2 mEq/L (ref 3.5–5.1)
Sodium: 137 mEq/L (ref 135–145)

## 2011-09-01 NOTE — Progress Notes (Signed)
[address] [place], [place]  [address] Phone: [phone] Fax:  [phone]  Date:  09/01/2011   Name:  [name] [name]   DOB:  [birth year]   MRN:  [id number]  PCP:  No primary provider on file.  Primary Cardiologist:  Dr. Rollene Rotunda  Primary Electrophysiologist:  None    History of Present Illness: [name] [name] is a 78 y.o. female who returns for eval of LE edema.    She has a history of HTN, transient global amnesia, atrial tach.  Last seen by Dr. Antoine Poche 04/2011.  Last echo 01/2007: EF 55-60%, mild LAE.  She is here with her daughter today.  Her daughter notes that the patient developed increasing lower extremity edema over the last couple of weeks.  This began when the patient was sitting for prolonged periods of time after her daughter had surgery.  She denies any injury to her legs.  The patient has not been hospitalized.  She does not describe increased salt intake.  She notes dyspnea with more extreme activities.  Otherwise, she denies any significant shortness of breath.  She denies orthopnea or PND.  She denies chest pain or syncope.  Her edema is improved in the mornings and worsening afternoons.  She denies calf pain.  Wt Readings from Last 3 Encounters:  09/01/11 174 lb (78.926 kg)  04/22/11 170 lb (77.111 kg)  10/24/10 170 lb (77.111 kg)    Labs:  (01/2010) K 4, creatinine 0.6 Labs:  (04/2011) TSH 2.21, Hgb 14.6   Past Medical History  Diagnosis Date  . HTN (hypertension)   . Tremors of nervous system     benign familial  . Transient global amnesia     AMS  . Atrial tachycardia   . Jaundice due to hepatitis     at age 86 or 62    Current Outpatient Prescriptions  Medication Sig Dispense Refill  . amLODipine (NORVASC) 10 MG tablet Take 1 tablet (10 mg total) by mouth daily.  90 tablet  3  . cloNIDine (CATAPRES) 0.3 MG tablet TAKE 1 TABLET TWICE A DAY  180 tablet  3  . metoprolol tartrate (LOPRESSOR) 25 MG tablet Take 1 tablet  (25 mg total) by mouth 2 (two) times daily.  180 tablet  1  . spironolactone (ALDACTONE) 50 MG tablet Take 1 tablet (50 mg total) by mouth daily.  90 tablet  1    Allergies: Allergies  Allergen Reactions  . Aspirin   . Codeine   . Hydrochlorothiazide   . Penicillins   . Sulfonamide Derivatives     History  Substance Use Topics  . Smoking status: Never Smoker   . Smokeless tobacco: Not on file   Comment: + prior 2nd hand exposure from spouse smoking  . Alcohol Use: No     ROS:  Please see the history of present illness.     All other systems reviewed and negative.   PHYSICAL EXAM: VS:  BP 144/58  Pulse 65  Ht 5\' 4"  (1.626 m)  Wt 174 lb (78.926 kg)  BMI 29.87 kg/m2 Well nourished, well developed, in no acute distress HEENT: normal Neck: no JVD Cardiac:  normal S1, S2; RRR; no murmur Lungs:  clear to auscultation bilaterally, no wheezing, rhonchi or rales Abd: soft, nontender, no hepatomegaly Ext: 1+ bilat LE edema Skin: warm and dry Neuro:  CNs 2-12 intact, no focal abnormalities noted  EKG:  Sinus rhythm, heart rate 65, PACs, nonspecific ST-T wave  changes, no change from prior tracing      ASSESSMENT AND PLAN:  1.  Edema She has evidence of significant varicosities on exam.  I suspect this is all venous insufficiency which is worsened by concomitant amlodipine therapy. Her calves are soft and she has symmetrical edema. Her blood pressure is fairly well controlled.  At this point, I would not decrease or stop her amlodipine. Check a basic metabolic panel, LFTs, BNP and urinalysis.  If her BNP is significantly elevated, consider adding furosemide and obtaining an echocardiogram. I will also prescribe LE compression stockings.  We discussed elevating her feet as well.  2.  Hypertension Decent control.  Follow up with Dr. Antoine Poche as planned in about 6 weeks.  Signed, Tereso Newcomer, PA-C  12:30 PM 09/01/2011

## 2011-09-01 NOTE — Patient Instructions (Addendum)
Your physician recommends that you have blood work done today; BNP, BMET, LFT, U/A  Your physician recommends that you continue on your current medications as directed. Please refer to the Current Medication list given to you today.  Your physician recommends that you schedule a follow-up appointment in: 6 weeks with Dr. Antoine Poche  Your physician has prescribed compression hose for you for swelling.

## 2011-09-03 LAB — URINALYSIS, MICROSCOPIC ONLY: Squamous Epithelial / LPF: NONE SEEN

## 2011-09-04 ENCOUNTER — Telehealth: Payer: Self-pay | Admitting: *Deleted

## 2011-09-04 NOTE — Telephone Encounter (Signed)
Message copied by Awilda Bill on Thu Sep 04, 2011  8:34 AM ------      Message from: [place], [place] T      Created: Mon Sep 01, 2011  5:14 PM       Labs ok       U/a pending      Tereso Newcomer, PA-C  5:13 PM 09/01/2011

## 2011-09-04 NOTE — Telephone Encounter (Signed)
Called patient regarding lab results.  Patient aware.  Pt also aware UA pending and we will call her with those results.  Vista Mink, CMA

## 2011-09-10 ENCOUNTER — Other Ambulatory Visit: Payer: Self-pay | Admitting: Cardiology

## 2011-09-11 NOTE — Telephone Encounter (Signed)
..   Requested Prescriptions   Pending Prescriptions Disp Refills  . spironolactone (ALDACTONE) 50 MG tablet [Pharmacy Med Name: SPIRONOLACTONE TAB 50MG ] 90 tablet 3    Sig: TAKE 1 TABLET DAILY

## 2011-10-24 DIAGNOSIS — M722 Plantar fascial fibromatosis: Secondary | ICD-10-CM | POA: Diagnosis not present

## 2011-10-24 DIAGNOSIS — B351 Tinea unguium: Secondary | ICD-10-CM | POA: Diagnosis not present

## 2011-10-28 ENCOUNTER — Ambulatory Visit: Payer: MEDICARE | Admitting: Cardiology

## 2011-11-20 ENCOUNTER — Ambulatory Visit: Payer: MEDICARE | Admitting: Cardiology

## 2011-11-26 DIAGNOSIS — M722 Plantar fascial fibromatosis: Secondary | ICD-10-CM | POA: Diagnosis not present

## 2011-12-01 ENCOUNTER — Other Ambulatory Visit: Payer: Self-pay | Admitting: Cardiology

## 2011-12-25 ENCOUNTER — Encounter: Payer: Self-pay | Admitting: Cardiology

## 2011-12-25 ENCOUNTER — Ambulatory Visit (INDEPENDENT_AMBULATORY_CARE_PROVIDER_SITE_OTHER): Payer: MEDICARE | Admitting: Cardiology

## 2011-12-25 VITALS — BP 161/82 | HR 54 | Ht 65.0 in | Wt 171.8 lb

## 2011-12-25 DIAGNOSIS — I10 Essential (primary) hypertension: Secondary | ICD-10-CM | POA: Diagnosis not present

## 2011-12-25 DIAGNOSIS — I471 Supraventricular tachycardia: Secondary | ICD-10-CM

## 2011-12-25 DIAGNOSIS — R002 Palpitations: Secondary | ICD-10-CM

## 2011-12-25 NOTE — Progress Notes (Signed)
   HPI The patient presents for follow up of HTN.  Since I last saw her she was seen with some increasing lower extremity edema and was seen in our office.  However, this seems to have resolved. She's having no new palpitations, presyncope or syncope. She's having no chest discomfort, neck or arm discomfort. She's had no weight gain or new lower extremity edema.  Allergies  Allergen Reactions  . Aspirin   . Codeine   . Hydrochlorothiazide   . Penicillins   . Sulfonamide Derivatives     Current Outpatient Prescriptions  Medication Sig Dispense Refill  . amLODipine (NORVASC) 10 MG tablet TAKE 1 TABLET DAILY  90 tablet  3  . cloNIDine (CATAPRES) 0.3 MG tablet TAKE 1 TABLET TWICE A DAY  180 tablet  3  . metoprolol tartrate (LOPRESSOR) 25 MG tablet Take 25 mg by mouth 2 (two) times daily. Take 1/2 tab in am and 1/2 tab in pm      . spironolactone (ALDACTONE) 50 MG tablet TAKE 1 TABLET DAILY  90 tablet  3  . [DISCONTINUED] metoprolol tartrate (LOPRESSOR) 25 MG tablet Take 1 tablet (25 mg total) by mouth 2 (two) times daily.  180 tablet  1    Past Medical History  Diagnosis Date  . HTN (hypertension)   . Tremors of nervous system     benign familial  . Transient global amnesia     AMS  . Atrial tachycardia   . Jaundice due to hepatitis     at age 73 or 9    Past Surgical History  Procedure Date  . Back surgery     years ago  . Abdominal hernia repair   . Tonsillectomy     ROS:  As stated in the HPI and negative for all other systems.  PHYSICAL EXAM BP 161/82  Pulse 54  Ht 5\' 5"  (1.651 m)  Wt 171 lb 12.8 oz (77.928 kg)  BMI 28.59 kg/m2 GENERAL:  Well appearing NECK:  No jugular venous distention, waveform within normal limits, carotid upstroke brisk and symmetric, no bruits, no thyromegaly LUNGS:  Clear to auscultation bilaterally CHEST:  Unremarkable HEART:  PMI not displaced or sustained,S1 and S2 within normal limits, no S3, no S4, no clicks, no rubs, no murmurs ABD:   Flat, positive bowel sounds normal in frequency in pitch, no bruits, no rebound, no guarding, no midline pulsatile mass, no hepatomegaly, no splenomegaly EXT:  2 plus pulses throughout, right greater than left non pitting swelling, no cyanosis no clubbing NEURO:  Tremor  EKG: Sinus rhythm, rate 54, axis within normal limits, intervals within normal limits, no acute ST-T wave changes.   ASSESSMENT AND PLAN   ESSENTIAL HYPERTENSION, BENIGN -  Her blood pressure is slightly high.  However, it has been well controlled generally. At this point I will make no change her medical regimen. She will continue the meds as listed.  PALPITATIONS -  She does not notice this. No change in therapy is indicated.

## 2011-12-25 NOTE — Patient Instructions (Addendum)
Your physician wants you to follow-up in: 6 months with Dr. Hochrein.  You will receive a reminder letter in the mail two months in advance. If you don't receive a letter, please call our office to schedule the follow-up appointment.  

## 2012-01-12 DIAGNOSIS — H2510 Age-related nuclear cataract, unspecified eye: Secondary | ICD-10-CM | POA: Diagnosis not present

## 2012-02-11 DIAGNOSIS — J189 Pneumonia, unspecified organism: Secondary | ICD-10-CM

## 2012-02-11 HISTORY — DX: Pneumonia, unspecified organism: J18.9

## 2012-02-17 ENCOUNTER — Other Ambulatory Visit: Payer: Self-pay | Admitting: Cardiology

## 2012-03-12 ENCOUNTER — Telehealth: Payer: Self-pay | Admitting: Cardiology

## 2012-03-12 MED ORDER — METOPROLOL TARTRATE 25 MG PO TABS: 12.5000 mg | ORAL_TABLET | Freq: Two times a day (BID) | ORAL | Status: DC

## 2012-03-12 NOTE — Telephone Encounter (Signed)
Called pt, she stated Rx needed to be sent to [facility] not [facility].  Caralee Ates, CMA

## 2012-03-12 NOTE — Telephone Encounter (Signed)
New Problem    Refill Request Prescription instruction error. Pt husband is calling for correction. Motoprolol 25 mg  Should be 90 day supply to [facility] at [facility]

## 2012-05-10 DIAGNOSIS — N814 Uterovaginal prolapse, unspecified: Secondary | ICD-10-CM | POA: Diagnosis not present

## 2012-05-17 DIAGNOSIS — L600 Ingrowing nail: Secondary | ICD-10-CM | POA: Diagnosis not present

## 2012-05-17 DIAGNOSIS — B351 Tinea unguium: Secondary | ICD-10-CM | POA: Diagnosis not present

## 2012-06-07 DIAGNOSIS — N814 Uterovaginal prolapse, unspecified: Secondary | ICD-10-CM | POA: Diagnosis not present

## 2012-07-09 DIAGNOSIS — N814 Uterovaginal prolapse, unspecified: Secondary | ICD-10-CM | POA: Diagnosis not present

## 2012-07-15 DIAGNOSIS — H2510 Age-related nuclear cataract, unspecified eye: Secondary | ICD-10-CM | POA: Diagnosis not present

## 2012-07-19 ENCOUNTER — Encounter: Payer: Self-pay | Admitting: Physician Assistant

## 2012-07-19 ENCOUNTER — Ambulatory Visit (INDEPENDENT_AMBULATORY_CARE_PROVIDER_SITE_OTHER): Payer: MEDICARE | Admitting: Physician Assistant

## 2012-07-19 VITALS — BP 140/74 | HR 63 | Ht 65.0 in | Wt 177.8 lb

## 2012-07-19 DIAGNOSIS — I10 Essential (primary) hypertension: Secondary | ICD-10-CM

## 2012-07-19 LAB — BASIC METABOLIC PANEL
CO2: 23 mEq/L (ref 19–32)
Chloride: 103 mEq/L (ref 96–112)
Creatinine, Ser: 0.9 mg/dL (ref 0.4–1.2)
Sodium: 135 mEq/L (ref 135–145)

## 2012-07-19 NOTE — Patient Instructions (Addendum)
LAB TODAY; BMET  Your physician wants you to follow-up in: 6 MONTHS WITH DR. HOCHREIN. You will receive a reminder letter in the mail two months in advance. If you don't receive a letter, please call our office to schedule the follow-up appointment.   NO CHANGES WERE MADE TODAY

## 2012-07-19 NOTE — Progress Notes (Signed)
  [address], [address] [place], [place]  [address] Phone: [phone] Fax:  [phone]  Date:  07/19/2012   ID:  [name] [name], DOB 03-Jul-1927, MRN [id number]  PCP:  No primary provider on file.  Cardiologist:  Dr. Rollene Rotunda     History of Present Illness: [name] [name] is a 79 y.o. female who returns for f/u.  She has a history of HTN, transient global amnesia, atrial tach, LE edema.  Last echo 01/2007: EF 55-60%, mild LAE.  The patient denies chest pain, significant shortness of breath, syncope, orthopnea, PND or significant pedal edema.    Labs (12/11): K 4, creatinine 0.6 Labs (3/13):   TSH 2.21, Hgb 14.6 Labs (7/13):   K 4.2, Cr 0.9, ALT 18, proBNP 99,   Wt Readings from Last 3 Encounters:  07/19/12 177 lb 12.8 oz (80.65 kg)  12/25/11 171 lb 12.8 oz (77.928 kg)  09/01/11 174 lb (78.926 kg)     Past Medical History  Diagnosis Date  . HTN (hypertension)   . Tremors of nervous system     benign familial  . Transient global amnesia     AMS  . Atrial tachycardia   . Jaundice due to hepatitis     at age 21 or 62    Current Outpatient Prescriptions  Medication Sig Dispense Refill  . amLODipine (NORVASC) 10 MG tablet TAKE 1 TABLET DAILY  90 tablet  3  . cloNIDine (CATAPRES) 0.3 MG tablet TAKE 1 TABLET TWICE A DAY  180 tablet  3  . metoprolol tartrate (LOPRESSOR) 25 MG tablet Take 0.5 tablets (12.5 mg total) by mouth 2 (two) times daily.  90 tablet  2  . spironolactone (ALDACTONE) 50 MG tablet TAKE 1 TABLET DAILY  90 tablet  3   No current facility-administered medications for this visit.    Allergies:    Allergies  Allergen Reactions  . Aspirin   . Codeine   . Hydrochlorothiazide   . Penicillins   . Sulfonamide Derivatives     Social History:  The patient  reports that she has never smoked. She does not have any smokeless tobacco history on file. She reports that she does not drink alcohol.   ROS:  Please see the history of present illness.       All other systems reviewed and negative.   PHYSICAL EXAM: VS:  BP 140/74  Pulse 63  Ht 5\' 5"  (1.651 m)  Wt 177 lb 12.8 oz (80.65 kg)  BMI 29.59 kg/m2 Well nourished, well developed, in no acute distress HEENT: normal Neck: no JVD Cardiac:  normal S1, S2; RRR; no murmur Lungs:  clear to auscultation bilaterally, no wheezing, rhonchi or rales Abd: soft, nontender, no hepatomegaly Ext: trace bilateral edema Skin: warm and dry Neuro:  CNs 2-12 intact, no focal abnormalities noted  EKG:  NSR, HR 63, PACs, atrial bigeminy     ASSESSMENT AND PLAN:  1. Hypertension:  BP under good control.  Check BMET today.   2. Edema:  Controlled.  No further intervention. 3. Disposition:  F/u with Dr. Rollene Rotunda in 6 mos.  Signed, Tereso Newcomer, PA-C  07/19/2012 11:32 AM

## 2012-07-20 ENCOUNTER — Telehealth: Payer: Self-pay | Admitting: *Deleted

## 2012-07-20 NOTE — Telephone Encounter (Signed)
Message copied by Tarri Fuller on Tue Jul 20, 2012 10:33 AM ------      Message from: [place], [place] T      Created: Mon Jul 19, 2012  4:50 PM       Potassium and creatinine normal      Continue current therapy      Tereso Newcomer, PA-C        07/19/2012 4:50 PM ------

## 2012-07-20 NOTE — Telephone Encounter (Signed)
lmom labs ok, no changes to be made 

## 2012-08-24 ENCOUNTER — Other Ambulatory Visit: Payer: Self-pay

## 2012-08-24 MED ORDER — SPIRONOLACTONE 50 MG PO TABS: ORAL_TABLET | ORAL | Status: DC

## 2012-09-08 DIAGNOSIS — N814 Uterovaginal prolapse, unspecified: Secondary | ICD-10-CM | POA: Diagnosis not present

## 2012-09-30 ENCOUNTER — Telehealth: Payer: Self-pay | Admitting: Cardiology

## 2012-09-30 NOTE — Telephone Encounter (Signed)
PT WAS SCHEDULED TO SEE [place], [place] Monday 8/25

## 2012-09-30 NOTE — Telephone Encounter (Signed)
New problem   Pt need appt w/dr hochrein per pt's daughter Marylu Lund calling very soon. Please call pt. Dr Antoine Poche didn't have available until oct. Call janet at [phone]

## 2012-10-04 ENCOUNTER — Ambulatory Visit: Payer: MEDICARE | Admitting: Physician Assistant

## 2012-11-02 DIAGNOSIS — N8111 Cystocele, midline: Secondary | ICD-10-CM | POA: Diagnosis not present

## 2012-11-09 ENCOUNTER — Other Ambulatory Visit: Payer: Self-pay | Admitting: Cardiology

## 2012-11-09 ENCOUNTER — Other Ambulatory Visit: Payer: Self-pay

## 2012-11-09 MED ORDER — AMLODIPINE BESYLATE 10 MG PO TABS: ORAL_TABLET | ORAL | Status: DC

## 2013-01-03 DIAGNOSIS — N8111 Cystocele, midline: Secondary | ICD-10-CM | POA: Diagnosis not present

## 2013-01-19 ENCOUNTER — Encounter: Payer: Self-pay | Admitting: Cardiology

## 2013-01-19 ENCOUNTER — Ambulatory Visit (INDEPENDENT_AMBULATORY_CARE_PROVIDER_SITE_OTHER): Payer: MEDICARE | Admitting: Cardiology

## 2013-01-19 VITALS — BP 120/84 | HR 67 | Ht 64.0 in | Wt 182.4 lb

## 2013-01-19 DIAGNOSIS — I499 Cardiac arrhythmia, unspecified: Secondary | ICD-10-CM

## 2013-01-19 DIAGNOSIS — R002 Palpitations: Secondary | ICD-10-CM

## 2013-01-19 MED ORDER — CLONIDINE HCL 0.3 MG PO TABS: ORAL_TABLET | ORAL | Status: DC

## 2013-01-19 NOTE — Progress Notes (Signed)
   HPI The patient presents for follow up of HTN. Since I last saw her she did have some edema and saw Tereso Newcomer PAc.  This however is mild and baseline. She's having no new palpitations, presyncope or syncope. She's having no chest discomfort, neck or arm discomfort. She's had no weight gain.  She is under stress as her daughter has melanoma in her brain.    Allergies  Allergen Reactions  . Aspirin   . Codeine   . Hydrochlorothiazide   . Penicillins   . Sulfonamide Derivatives     Current Outpatient Prescriptions  Medication Sig Dispense Refill  . amLODipine (NORVASC) 10 MG tablet TAKE 1 TABLET DAILY  90 tablet  3  . cloNIDine (CATAPRES) 0.3 MG tablet TAKE 1 TABLET TWICE A DAY  180 tablet  3  . metoprolol tartrate (LOPRESSOR) 25 MG tablet TAKE 1/2 TABLET TWICE A DAY  90 tablet  0  . spironolactone (ALDACTONE) 50 MG tablet TAKE 1 TABLET DAILY  90 tablet  3   No current facility-administered medications for this visit.    Past Medical History  Diagnosis Date  . HTN (hypertension)   . Tremors of nervous system     benign familial  . Transient global amnesia     AMS  . Atrial tachycardia   . Jaundice due to hepatitis     at age 73 or 63    Past Surgical History  Procedure Laterality Date  . Back surgery      years ago  . Abdominal hernia repair    . Tonsillectomy      ROS:  As stated in the HPI and negative for all other systems.  PHYSICAL EXAM BP 120/84  Pulse 67  Ht 5\' 4"  (1.626 m)  Wt 182 lb 6.4 oz (82.736 kg)  BMI 31.29 kg/m2 GENERAL:  Well appearing NECK:  No jugular venous distention, waveform within normal limits, carotid upstroke brisk and symmetric, no bruits, no thyromegaly LUNGS:  Clear to auscultation bilaterally CHEST:  Unremarkable HEART:  PMI not displaced or sustained,S1 and S2 within normal limits, no S3, no S4, no clicks, no rubs, no murmurs ABD:  Flat, positive bowel sounds normal in frequency in pitch, no bruits, no rebound, no guarding, no  midline pulsatile mass, no hepatomegaly, no splenomegaly EXT:  2 plus pulses throughout, right greater than left non pitting swelling, no cyanosis no clubbing NEURO:  Tremor  EKG: Sinus rhythm, rate 68, axis within normal limits, intervals within normal limits, no acute ST-T wave changes.  01/19/2013    ASSESSMENT AND PLAN   ESSENTIAL HYPERTENSION, BENIGN -  The blood pressure is at target. No change in medications is indicated. We will continue with therapeutic lifestyle changes (TLC).   PALPITATIONS -  She does not notice this. No change in therapy is indicated.

## 2013-01-19 NOTE — Patient Instructions (Signed)
The current medical regimen is effective;  continue present plan and medications.  Follow up in 6 months with Dr Hochrein.  You will receive a letter in the mail 2 months before you are due.  Please call us when you receive this letter to schedule your follow up appointment.  

## 2013-02-26 ENCOUNTER — Other Ambulatory Visit: Payer: Self-pay | Admitting: Cardiology

## 2013-03-01 DIAGNOSIS — N814 Uterovaginal prolapse, unspecified: Secondary | ICD-10-CM | POA: Diagnosis not present

## 2013-03-10 DIAGNOSIS — H2510 Age-related nuclear cataract, unspecified eye: Secondary | ICD-10-CM | POA: Diagnosis not present

## 2013-03-23 DIAGNOSIS — K5900 Constipation, unspecified: Secondary | ICD-10-CM | POA: Diagnosis not present

## 2013-03-23 DIAGNOSIS — N949 Unspecified condition associated with female genital organs and menstrual cycle: Secondary | ICD-10-CM | POA: Diagnosis not present

## 2013-03-23 DIAGNOSIS — Z4689 Encounter for fitting and adjustment of other specified devices: Secondary | ICD-10-CM | POA: Diagnosis not present

## 2013-05-16 DIAGNOSIS — N816 Rectocele: Secondary | ICD-10-CM | POA: Diagnosis not present

## 2013-05-16 DIAGNOSIS — N8111 Cystocele, midline: Secondary | ICD-10-CM | POA: Diagnosis not present

## 2013-06-09 ENCOUNTER — Other Ambulatory Visit: Payer: Self-pay | Admitting: Cardiology

## 2013-07-12 ENCOUNTER — Encounter: Payer: Self-pay | Admitting: Cardiology

## 2013-07-18 ENCOUNTER — Encounter: Payer: Self-pay | Admitting: Cardiology

## 2013-07-18 ENCOUNTER — Ambulatory Visit (INDEPENDENT_AMBULATORY_CARE_PROVIDER_SITE_OTHER): Payer: MEDICARE | Admitting: Cardiology

## 2013-07-18 ENCOUNTER — Encounter (INDEPENDENT_AMBULATORY_CARE_PROVIDER_SITE_OTHER): Payer: Self-pay

## 2013-07-18 VITALS — BP 149/63 | HR 63 | Ht 64.0 in | Wt 181.0 lb

## 2013-07-18 DIAGNOSIS — R002 Palpitations: Secondary | ICD-10-CM

## 2013-07-18 NOTE — Patient Instructions (Signed)
Your physician recommends that you continue on your current medications as directed. Please refer to the Current Medication list given to you today.  Your physician wants you to follow-up in: 1 year with Dr. Percival Spanish. You will receive a reminder letter in the mail two months in advance. If you don't receive a letter, please call our office to schedule the follow-up appointment.

## 2013-07-18 NOTE — Progress Notes (Signed)
   HPI The patient presents for follow up of HTN. Since I last saw her she has had no new problems.  She still has some lower extremity edema  This however is mild and baseline. She's having no new palpitations, presyncope or syncope. She's having no chest discomfort, neck or arm discomfort. She's had no weight gain.  She is under stress as her daughter has melanoma in her brain although she is responding to therapy.   Allergies  Allergen Reactions  . Aspirin   . Codeine   . Hydrochlorothiazide   . Penicillins   . Sulfonamide Derivatives     Current Outpatient Prescriptions  Medication Sig Dispense Refill  . amLODipine (NORVASC) 10 MG tablet TAKE 1 TABLET DAILY  90 tablet  3  . cloNIDine (CATAPRES) 0.3 MG tablet TAKE 1 TABLET TWICE A DAY  180 tablet  3  . metoprolol tartrate (LOPRESSOR) 25 MG tablet TAKE 1/2 TABLET TWICE A DAY  90 tablet  1  . spironolactone (ALDACTONE) 50 MG tablet TAKE 1 TABLET DAILY  90 tablet  3   No current facility-administered medications for this visit.    Past Medical History  Diagnosis Date  . HTN (hypertension)   . Tremors of nervous system     benign familial  . Transient global amnesia     AMS  . Atrial tachycardia   . Jaundice due to hepatitis     at age 45 or 73    Past Surgical History  Procedure Laterality Date  . Back surgery      years ago  . Abdominal hernia repair    . Tonsillectomy      ROS:  As stated in the HPI and negative for all other systems.  PHYSICAL EXAM BP 149/63  Pulse 63  Ht 5\' 4"  (1.626 m)  Wt 181 lb (82.101 kg)  BMI 31.05 kg/m2 GENERAL:  Well appearing NECK:  No jugular venous distention, waveform within normal limits, carotid upstroke brisk and symmetric, no bruits, no thyromegaly LUNGS:  Clear to auscultation bilaterally CHEST:  Unremarkable HEART:  PMI not displaced or sustained,S1 and S2 within normal limits, no S3, no S4, no clicks, no rubs, no murmurs ABD:  Flat, positive bowel sounds normal in frequency  in pitch, no bruits, no rebound, no guarding, no midline pulsatile mass, no hepatomegaly, no splenomegaly EXT:  2 plus pulses throughout, right greater than left non pitting swelling, no cyanosis no clubbing NEURO:  Tremor  EKG: Sinus rhythm, rate 63, axis within normal limits, PACs in a bigeminal pattern, intervals within normal limits, no acute ST-T wave changes.  07/18/2013  ASSESSMENT AND PLAN   ESSENTIAL HYPERTENSION, BENIGN -  The blood pressure is at target. No change in medications is indicated. We will continue with therapeutic lifestyle changes (TLC).   PALPITATIONS -  She does not notice this. No change in therapy is indicated.

## 2013-07-19 DIAGNOSIS — N816 Rectocele: Secondary | ICD-10-CM | POA: Diagnosis not present

## 2013-07-19 DIAGNOSIS — N8111 Cystocele, midline: Secondary | ICD-10-CM | POA: Diagnosis not present

## 2013-08-09 ENCOUNTER — Other Ambulatory Visit: Payer: Self-pay | Admitting: Cardiology

## 2013-09-08 DIAGNOSIS — H40019 Open angle with borderline findings, low risk, unspecified eye: Secondary | ICD-10-CM | POA: Diagnosis not present

## 2013-09-08 DIAGNOSIS — H2510 Age-related nuclear cataract, unspecified eye: Secondary | ICD-10-CM | POA: Diagnosis not present

## 2013-09-14 DIAGNOSIS — N814 Uterovaginal prolapse, unspecified: Secondary | ICD-10-CM | POA: Diagnosis not present

## 2013-10-07 ENCOUNTER — Ambulatory Visit (INDEPENDENT_AMBULATORY_CARE_PROVIDER_SITE_OTHER): Payer: Medicare Other

## 2013-10-07 VITALS — BP 156/92 | HR 68 | Resp 12

## 2013-10-07 DIAGNOSIS — Z9189 Other specified personal risk factors, not elsewhere classified: Secondary | ICD-10-CM

## 2013-10-07 DIAGNOSIS — S90122A Contusion of left lesser toe(s) without damage to nail, initial encounter: Secondary | ICD-10-CM

## 2013-10-07 DIAGNOSIS — M79676 Pain in unspecified toe(s): Secondary | ICD-10-CM

## 2013-10-07 DIAGNOSIS — M79609 Pain in unspecified limb: Secondary | ICD-10-CM

## 2013-10-07 DIAGNOSIS — B351 Tinea unguium: Secondary | ICD-10-CM

## 2013-10-07 DIAGNOSIS — S90129A Contusion of unspecified lesser toe(s) without damage to nail, initial encounter: Secondary | ICD-10-CM

## 2013-10-07 NOTE — Patient Instructions (Signed)
ANTIBACTERIAL SOAP INSTRUCTIONS  THE DAY AFTER PROCEDURE  Please follow the instructions your doctor has marked.   Shower as usual. Before getting out, place a drop of antibacterial liquid soap (Dial) on a wet, clean washcloth.  Gently wipe washcloth over affected area.  Afterward, rinse the area with warm water.  Blot the area dry with a soft cloth and cover with antibiotic ointment (neosporin, polysporin, bacitracin) and band aid or gauze and tape  Place 3-4 drops of antibacterial liquid soap in a quart of warm tap water.  Submerge foot into water for 20 minutes.  If bandage was applied after your procedure, leave on to allow for easy lift off, then remove and continue with soak for the remaining time.  Next, blot area dry with a soft cloth and cover with a bandage.  Apply other medications as directed by your doctor, such as cortisporin otic solution (eardrops) or neosporin antibiotic ointment  May wash or soak foot in Epsom salts and water or endometrial soap and water and dried thoroughly apply some Neosporin to the fourth toe due to daily for up to a week to help resolve the contusion of toe

## 2013-10-07 NOTE — Progress Notes (Signed)
   Subjective:    Patient ID: [name] [name], female    DOB: [birth year], 80 y.o.   MRN: [id number]  HPI PT STATED CHECK LT FOOT 4TH TOENAIL IS SORE FOR 1 WEEK. THE TOENAIL IS GETTING WORSE AND IT GET AGGRAVATED BY PRESSURE. TRIED TO USED CORTISONE BID AND IT HELP SOME.  ALSO TOENAIL TRIM.   Review of Systems  Musculoskeletal: Positive for gait problem.  All other systems reviewed and are negative.      Objective:   Physical Exam 80 year old female presents at this time well-developed well-nourished or and sensory is never any history of trauma may have bumped her fourth toe left foot there is some erythema the distal nails of and nail fold there is some yellowing discoloration and proximal displacement of the nail plate itself. Patient cases there had been some bleeding however there is no active bleeder discharge no purulence remaining exam reveals pedal pulses are palpable DP postal for bilateral PT one over 4 bilateral mild varicosities bilateral with plus one + edema. Patient does have a stasis dermatitis of the right leg and weeping on venous stasis ulcer she's been dressing on the right leg. No signs of infection noted no open wounds no active no other active ulcers noted mild digital contractures noted orthopedic exam mild HAV deformity      Assessment & Plan:  Assessment this time is nail dystrophy with discoloration and friability proptosis nails hallux through fifth digits bilateral painful tender and criptotic incurvated Christine nail trauma to the fourth left to nail trauma and distal tuft and nailbed this is also debrided this time Neosporin applied to the fourth toe left as well as to left hallux following debridement Wesly Whisenant lumicain Neosporin no active signs of infection however we'll monitor for any regrowth or recurrence of difficulties followup in the future as needed.  Harriet Masson DPM

## 2013-11-09 ENCOUNTER — Other Ambulatory Visit: Payer: Self-pay | Admitting: Cardiology

## 2013-11-23 DIAGNOSIS — N814 Uterovaginal prolapse, unspecified: Secondary | ICD-10-CM | POA: Diagnosis not present

## 2013-11-25 ENCOUNTER — Other Ambulatory Visit: Payer: Self-pay

## 2014-01-23 ENCOUNTER — Other Ambulatory Visit: Payer: Self-pay | Admitting: Cardiology

## 2014-01-24 DIAGNOSIS — N816 Rectocele: Secondary | ICD-10-CM | POA: Diagnosis not present

## 2014-01-24 DIAGNOSIS — N8111 Cystocele, midline: Secondary | ICD-10-CM | POA: Diagnosis not present

## 2014-04-12 DIAGNOSIS — N8111 Cystocele, midline: Secondary | ICD-10-CM | POA: Diagnosis not present

## 2014-04-12 DIAGNOSIS — N816 Rectocele: Secondary | ICD-10-CM | POA: Diagnosis not present

## 2014-06-24 ENCOUNTER — Other Ambulatory Visit: Payer: Self-pay | Admitting: Cardiology

## 2014-07-14 DIAGNOSIS — N816 Rectocele: Secondary | ICD-10-CM | POA: Diagnosis not present

## 2014-07-14 DIAGNOSIS — N8111 Cystocele, midline: Secondary | ICD-10-CM | POA: Diagnosis not present

## 2014-07-25 ENCOUNTER — Observation Stay (HOSPITAL_COMMUNITY): Payer: MEDICARE

## 2014-07-25 ENCOUNTER — Emergency Department (HOSPITAL_COMMUNITY): Payer: MEDICARE

## 2014-07-25 ENCOUNTER — Encounter (HOSPITAL_COMMUNITY): Payer: Self-pay

## 2014-07-25 ENCOUNTER — Telehealth: Payer: Self-pay | Admitting: Cardiology

## 2014-07-25 ENCOUNTER — Observation Stay (HOSPITAL_COMMUNITY)
Admission: EM | Admit: 2014-07-25 | Discharge: 2014-07-26 | Disposition: A | Discharge status: Home / Self Care | Payer: MEDICARE | Source: Intra-hospital | Attending: Internal Medicine | Admitting: Internal Medicine

## 2014-07-25 DIAGNOSIS — Z880 Allergy status to penicillin: Secondary | ICD-10-CM | POA: Diagnosis not present

## 2014-07-25 DIAGNOSIS — N390 Urinary tract infection, site not specified: Secondary | ICD-10-CM | POA: Diagnosis not present

## 2014-07-25 DIAGNOSIS — G930 Cerebral cysts: Secondary | ICD-10-CM | POA: Diagnosis not present

## 2014-07-25 DIAGNOSIS — Z885 Allergy status to narcotic agent status: Secondary | ICD-10-CM | POA: Insufficient documentation

## 2014-07-25 DIAGNOSIS — Z886 Allergy status to analgesic agent status: Secondary | ICD-10-CM | POA: Diagnosis not present

## 2014-07-25 DIAGNOSIS — I471 Supraventricular tachycardia: Secondary | ICD-10-CM | POA: Insufficient documentation

## 2014-07-25 DIAGNOSIS — G454 Transient global amnesia: Principal | ICD-10-CM | POA: Diagnosis present

## 2014-07-25 DIAGNOSIS — R413 Other amnesia: Secondary | ICD-10-CM | POA: Diagnosis present

## 2014-07-25 DIAGNOSIS — R410 Disorientation, unspecified: Secondary | ICD-10-CM | POA: Diagnosis not present

## 2014-07-25 DIAGNOSIS — Z882 Allergy status to sulfonamides status: Secondary | ICD-10-CM | POA: Insufficient documentation

## 2014-07-25 DIAGNOSIS — I10 Essential (primary) hypertension: Secondary | ICD-10-CM | POA: Diagnosis not present

## 2014-07-25 DIAGNOSIS — Z66 Do not resuscitate: Secondary | ICD-10-CM | POA: Insufficient documentation

## 2014-07-25 DIAGNOSIS — R40241 Glasgow coma scale score 13-15: Secondary | ICD-10-CM | POA: Diagnosis not present

## 2014-07-25 DIAGNOSIS — G250 Essential tremor: Secondary | ICD-10-CM | POA: Diagnosis not present

## 2014-07-25 DIAGNOSIS — R4182 Altered mental status, unspecified: Secondary | ICD-10-CM | POA: Diagnosis not present

## 2014-07-25 LAB — I-STAT CHEM 8, ED
BUN: 19 mg/dL (ref 6–20)
CALCIUM ION: 1.07 mmol/L — AB (ref 1.13–1.30)
Chloride: 105 mmol/L (ref 101–111)
Creatinine, Ser: 0.7 mg/dL (ref 0.44–1.00)
GLUCOSE: 112 mg/dL — AB (ref 65–99)
HCT: 45 % (ref 36.0–46.0)
Hemoglobin: 15.3 g/dL — ABNORMAL HIGH (ref 12.0–15.0)
Potassium: 3.9 mmol/L (ref 3.5–5.1)
Sodium: 137 mmol/L (ref 135–145)
TCO2: 19 mmol/L (ref 0–100)

## 2014-07-25 LAB — APTT: aPTT: 29 seconds (ref 24–37)

## 2014-07-25 LAB — COMPREHENSIVE METABOLIC PANEL
ALT: 15 U/L (ref 14–54)
AST: 25 U/L (ref 15–41)
Albumin: 4 g/dL (ref 3.5–5.0)
Alkaline Phosphatase: 107 U/L (ref 38–126)
Anion gap: 13 (ref 5–15)
BILIRUBIN TOTAL: 0.7 mg/dL (ref 0.3–1.2)
BUN: 16 mg/dL (ref 6–20)
CO2: 20 mmol/L — AB (ref 22–32)
Calcium: 9.1 mg/dL (ref 8.9–10.3)
Chloride: 104 mmol/L (ref 101–111)
Creatinine, Ser: 0.79 mg/dL (ref 0.44–1.00)
GFR calc Af Amer: >60 mL/min (ref >60)
Glucose, Bld: 113 mg/dL — ABNORMAL HIGH (ref 65–99)
Potassium: 3.9 mmol/L (ref 3.5–5.1)
SODIUM: 137 mmol/L (ref 135–145)
Total Protein: 7.4 g/dL (ref 6.5–8.1)

## 2014-07-25 LAB — I-STAT TROPONIN, ED: Troponin i, poc: 0 ng/mL (ref 0.00–0.08)

## 2014-07-25 LAB — RAPID URINE DRUG SCREEN, HOSP PERFORMED
Amphetamines: NOT DETECTED
Barbiturates: NOT DETECTED
Benzodiazepines: NOT DETECTED
Cocaine: NOT DETECTED
Opiates: NOT DETECTED
Tetrahydrocannabinol: NOT DETECTED

## 2014-07-25 LAB — DIFFERENTIAL
Basophils Absolute: 0 10*3/uL (ref 0.0–0.1)
Basophils Relative: 0 % (ref 0–1)
EOS ABS: 0.2 10*3/uL (ref 0.0–0.7)
EOS PCT: 3 % (ref 0–5)
LYMPHS ABS: 1.9 10*3/uL (ref 0.7–4.0)
Lymphocytes Relative: 28 % (ref 12–46)
MONO ABS: 0.6 10*3/uL (ref 0.1–1.0)
Monocytes Relative: 9 % (ref 3–12)
Neutro Abs: 4.1 10*3/uL (ref 1.7–7.7)
Neutrophils Relative %: 60 % (ref 43–77)

## 2014-07-25 LAB — PROTIME-INR
INR: 1.12 (ref 0.00–1.49)
Prothrombin Time: 14.6 seconds (ref 11.6–15.2)

## 2014-07-25 LAB — CBC
HCT: 41.8 % (ref 36.0–46.0)
Hemoglobin: 13.7 g/dL (ref 12.0–15.0)
MCH: 27.7 pg (ref 26.0–34.0)
MCHC: 32.8 g/dL (ref 30.0–36.0)
MCV: 84.4 fL (ref 78.0–100.0)
PLATELETS: 350 10*3/uL (ref 150–400)
RBC: 4.95 MIL/uL (ref 3.87–5.11)
RDW: 13.1 % (ref 11.5–15.5)
WBC: 6.9 10*3/uL (ref 4.0–10.5)

## 2014-07-25 LAB — TSH: TSH: 1.7 u[IU]/mL (ref 0.350–4.500)

## 2014-07-25 LAB — URINALYSIS, ROUTINE W REFLEX MICROSCOPIC
Bilirubin Urine: NEGATIVE
GLUCOSE, UA: NEGATIVE mg/dL
Ketones, ur: NEGATIVE mg/dL
NITRITE: NEGATIVE
PROTEIN: NEGATIVE mg/dL
Specific Gravity, Urine: 1.006 (ref 1.005–1.030)
Urobilinogen, UA: 0.2 mg/dL (ref 0.0–1.0)
pH: 6 (ref 5.0–8.0)

## 2014-07-25 LAB — URINE MICROSCOPIC-ADD ON

## 2014-07-25 LAB — ETHANOL: Alcohol, Ethyl (B): <5 mg/dL (ref <5)

## 2014-07-25 MED ORDER — METOPROLOL TARTRATE 12.5 MG HALF TABLET
12.5000 mg | ORAL_TABLET | Freq: Two times a day (BID) | ORAL | Status: DC
  Administered 2014-07-25 – 2014-07-26 (×2): 12.5 mg via ORAL
  Filled 2014-07-25 (×2): qty 1

## 2014-07-25 MED ORDER — ONDANSETRON HCL 4 MG/2ML IJ SOLN: 4.0000 mg | Freq: Four times a day (QID) | INTRAMUSCULAR | Status: DC | PRN

## 2014-07-25 MED ORDER — SODIUM CHLORIDE 0.9 % IJ SOLN
3.0000 mL | Freq: Two times a day (BID) | INTRAMUSCULAR | Status: DC
  Administered 2014-07-25 – 2014-07-26 (×2): 3 mL via INTRAVENOUS

## 2014-07-25 MED ORDER — CLONIDINE HCL 0.1 MG PO TABS
0.3000 mg | ORAL_TABLET | Freq: Two times a day (BID) | ORAL | Status: DC
  Administered 2014-07-25 – 2014-07-26 (×2): 0.3 mg via ORAL
  Filled 2014-07-25 (×2): qty 3

## 2014-07-25 MED ORDER — SPIRONOLACTONE 50 MG PO TABS
50.0000 mg | ORAL_TABLET | Freq: Every day | ORAL | Status: DC
  Administered 2014-07-26: 50 mg via ORAL
  Filled 2014-07-25: qty 2
  Filled 2014-07-25 (×2): qty 1
  Filled 2014-07-25: qty 2

## 2014-07-25 MED ORDER — ALUM & MAG HYDROXIDE-SIMETH 200-200-20 MG/5ML PO SUSP: 30.0000 mL | Freq: Four times a day (QID) | ORAL | Status: DC | PRN

## 2014-07-25 MED ORDER — ENOXAPARIN SODIUM 40 MG/0.4ML ~~LOC~~ SOLN
40.0000 mg | SUBCUTANEOUS | Status: DC
  Filled 2014-07-25: qty 0.4

## 2014-07-25 MED ORDER — ACETAMINOPHEN 325 MG PO TABS
650.0000 mg | ORAL_TABLET | Freq: Four times a day (QID) | ORAL | Status: DC | PRN
Start: 2014-07-25 — End: 2014-07-26

## 2014-07-25 MED ORDER — ONDANSETRON HCL 4 MG PO TABS: 4.0000 mg | ORAL_TABLET | Freq: Four times a day (QID) | ORAL | Status: DC | PRN

## 2014-07-25 MED ORDER — AMLODIPINE BESYLATE 10 MG PO TABS
10.0000 mg | ORAL_TABLET | Freq: Every day | ORAL | Status: DC
Start: 2014-07-25 — End: 2014-07-26
  Administered 2014-07-26: 10 mg via ORAL
  Filled 2014-07-25 (×2): qty 1

## 2014-07-25 MED ORDER — NITROFURANTOIN MONOHYD MACRO 100 MG PO CAPS
100.0000 mg | ORAL_CAPSULE | Freq: Two times a day (BID) | ORAL | Status: DC
  Administered 2014-07-26: 100 mg via ORAL
  Filled 2014-07-25 (×3): qty 1

## 2014-07-25 MED ORDER — ACETAMINOPHEN 650 MG RE SUPP
650.0000 mg | Freq: Four times a day (QID) | RECTAL | Status: DC | PRN
Start: 2014-07-25 — End: 2014-07-26

## 2014-07-25 NOTE — H&P (Addendum)
[facility] History and Physical  [name] [name] BWI:[id number] DOB: February 22, 1927 DOA: 07/25/2014  Referring physician:  PCP: No PCP Per Patient   Chief Complaint: Amnesia  HPI: [name] [name] is a 81 y.o. female with a past medical history of hypertension who presents to the emergency department with complaints confusion/disorientation. She reports being in her usual state health until this morning around 8:45 AM where family members noted her to have confusion characterized by repetition of the same question. Her husband reports that she Is asking what she have her breakfast that morning. Then she presented as her husband were was a this morning and could not remember what they did. Family members reporting that she did not have focal neurological deficits nor was there seizure-like activity observed. She did not have slurred speech and was able to perform ADL's. Symptoms improving by the time she presented to the emergency room. She was further workup with a CT scan of brain without contrast which did not show acute intracranial abnormality. Patient having a similar episode approximate 7 years ago which time symptoms were attributed to transient global amnesia.                                                                                                        Review of Systems:  Constitutional:  No weight loss, night sweats, Fevers, chills, fatigue.  HEENT:  No headaches, Difficulty swallowing,Tooth/dental problems,Sore throat,  No sneezing, itching, ear ache, nasal congestion, post nasal drip,  Cardio-vascular:  No chest pain, Orthopnea, PND, swelling in lower extremities, anasarca, dizziness, palpitations  GI:  No heartburn, indigestion, abdominal pain, nausea, vomiting, diarrhea, change in bowel habits, loss of appetite  Resp:  No shortness of breath with exertion or at rest. No excess mucus, no productive cough, No non-productive cough, No coughing up of blood.No change in  color of mucus.No wheezing.No chest wall deformity  Skin:  no rash or lesions.  GU:  no dysuria, change in color of urine, no urgency or frequency. No flank pain.  Musculoskeletal:  No joint pain or swelling. No decreased range of motion. No back pain.  Psych:  No change in mood or affect. No depression or anxiety. Positive for memory loss  Past Medical History  Diagnosis Date  . HTN (hypertension)   . Tremors of nervous system     benign familial  . Transient global amnesia     AMS  . Atrial tachycardia   . Jaundice due to hepatitis     at age 15 or 10   Past Surgical History  Procedure Laterality Date  . Back surgery      years ago  . Abdominal hernia repair    . Tonsillectomy     Social History:  reports that she has never smoked. She does not have any smokeless tobacco history on file. She reports that she does not drink alcohol. Her drug history is not on file.  Allergies  Allergen Reactions  . Aspirin   . Codeine   . Hydrochlorothiazide   . Penicillins   .  Sulfonamide Derivatives     Family History  Problem Relation Age of Onset  . Lung cancer Mother      Prior to Admission medications   Medication Sig Start Date End Date Taking? Authorizing Provider  amLODipine (NORVASC) 10 MG tablet TAKE 1 TABLET DAILY 11/10/13  Yes Minus Breeding, MD  cloNIDine (CATAPRES) 0.3 MG tablet TAKE 1 TABLET TWICE A DAY 06/26/14  Yes Minus Breeding, MD  metoprolol tartrate (LOPRESSOR) 25 MG tablet TAKE 1/2 TABLET TWICE A DAY   Yes Minus Breeding, MD  spironolactone (ALDACTONE) 50 MG tablet TAKE 1 TABLET DAILY   Yes Minus Breeding, MD   Physical Exam: Filed Vitals:   07/25/14 1530 07/25/14 1545 07/25/14 1615 07/25/14 1630  BP: 143/90 148/98 166/86 166/98  Pulse: 67 68 82 77  Temp:      TempSrc:      Resp: 22 23 22 27   Height:      Weight:      SpO2: 97% 96% 97% 95%    Wt Readings from Last 3 Encounters:  07/25/14 82.555 kg (182 lb)  07/18/13 82.101 kg (181 lb)  01/19/13  82.736 kg (182 lb 6.4 oz)    General:  Appears calm and comfortable, no acute distress she is awake and alert, following commands. Eyes: PERRL, normal lids, irises & conjunctiva, no scleral icterus ENT: grossly normal hearing, lips & tongue Neck: no LAD, masses or thyromegaly Cardiovascular: RRR, no m/r/g. No LE edema. Telemetry: SR, no arrhythmias  Respiratory: CTA bilaterally, no w/r/r. Normal respiratory effort. Abdomen: soft, ntnd Skin: no rash or induration seen on limited exam Musculoskeletal: grossly normal tone BUE/BLE Psychiatric: grossly normal mood and affect, speech fluent and appropriate Neurologic: Patient having a nonfocal neurologic examination, no facial droop or dysarthria, cranial nerves II through XII were intact, had 5 out of 5 muscle strength to bilateral upper extremities and bilateral lower extremities without alteration to sensation, had 1+ deep tendon reflexes.           Labs on Admission:  Basic Metabolic Panel:  Recent Labs Lab 07/25/14 1258 07/25/14 1303  NA 137 137  K 3.9 3.9  CL 104 105  CO2 20*  --   GLUCOSE 113* 112*  BUN 16 19  CREATININE 0.79 0.70  CALCIUM 9.1  --    Liver Function Tests:  Recent Labs Lab 07/25/14 1258  AST 25  ALT 15  ALKPHOS 107  BILITOT 0.7  PROT 7.4  ALBUMIN 4.0   No results for input(s): LIPASE, AMYLASE in the last 168 hours. No results for input(s): AMMONIA in the last 168 hours. CBC:  Recent Labs Lab 07/25/14 1258 07/25/14 1303  WBC 6.9  --   NEUTROABS 4.1  --   HGB 13.7 15.3*  HCT 41.8 45.0  MCV 84.4  --   PLT 350  --    Cardiac Enzymes: No results for input(s): CKTOTAL, CKMB, CKMBINDEX, TROPONINI in the last 168 hours.  BNP (last 3 results) No results for input(s): BNP in the last 8760 hours.  ProBNP (last 3 results) No results for input(s): PROBNP in the last 8760 hours.  CBG: No results for input(s): GLUCAP in the last 168 hours.  Radiological Exams on Admission: Ct Head Wo  Contrast  07/25/2014   CLINICAL DATA:  Altered mental status and confusion beginning today.  EXAM: CT HEAD WITHOUT CONTRAST  TECHNIQUE: Contiguous axial images were obtained from the base of the skull through the vertex without intravenous contrast.  COMPARISON:  Head  CT scan 01/10/2007.  Brain MRI 01/11/2007.  FINDINGS: Cerebellar atrophy is unchanged in appearance. No evidence of acute intracranial abnormality including hemorrhage, infarct, mass lesion, mass effect, midline shift or abnormal extra-axial fluid collection is identified. There is no hydrocephalus or pneumocephalus. Tiny amount of fluid in the left mastoid air cells is noted.  IMPRESSION: No acute abnormality.   Electronically Signed   By: Inge Rise M.D.   On: 07/25/2014 13:15    EKG: Independently reviewed.   Assessment/Plan Principal Problem:   AMNESIA, TRANSIENT GLOBAL Active Problems:   Essential hypertension, benign   TGA (transient global amnesia)   1. Probable transient global amnesia. Patient presenting to the emergency department with complaints of memory deficits that occurred this morning at approximately 8:45 AM. She appeared to have anterograde amnesia where she had difficulties forming new memories and family members appearing to describe the "broken record phenomenon" where she repeated the same question multiple times. She had a nonfocal neurologic examination with initial CT scan of brain showing acute intracranial changes. Symptoms improving over the course the day. I think TIA/CVAs less likely however she does have history of hypertension. Patient unsure if she would like to undergo MRI where family members felt stronger about her having one. She stated she would consider this procedure. Otherwise will place in overnight observation. If this is due to TGA her memory deficits should completely resolve in 24 hours.  2. Hypertension. Patient on multiple and hypertensive agents, will continue clonidine, metoprolol  and amlodipine. 3. UTI. Urinalysis showing the presence of a few bacteria with leukocytes. She denies foul-smelling urine, dysuria or hematuria. I think current symptoms are likely related to TGA. Will cover with Macrobid 100 mg PO BID 4. DVT prophylaxis. Lovenox     Code Status: DO NOT RESUSCITATE Family Communication: I spoke with family members who are present at bedside Disposition Plan: Will place in overnight observation, do not anticiparte her requiring greater than 2 nights hospitalization  Time spent: 55 min  Kelvin Cellar [facility] Pager [phone]

## 2014-07-25 NOTE — Progress Notes (Addendum)
Patient recd to room via stretcher. Patient ambulated a few steps to the bed with +1 assist. Patient and daughters oriented to room and safety plan. Call bell in reach, bed in low position, and bed alarm on. Disinfectant wipes provided at daughters request.

## 2014-07-25 NOTE — Progress Notes (Signed)
EEG Completed; Results Pending  

## 2014-07-25 NOTE — ED Notes (Signed)
Assisted patient to rest room, collected urine sample.

## 2014-07-25 NOTE — ED Provider Notes (Signed)
CSN: [id number]     Arrival date & time 07/25/14  1255 History   None    No chief complaint on file.    (Consider location/radiation/quality/duration/timing/severity/associated sxs/prior Treatment) HPI  81 year old female presents as a code stroke. The patient was apparently normal at around 8:45 AM when her husband last saw her. When he saw her later today she was having altered mental status and repetitive speech. There has been no focal weakness per EMS. When I talked to the patient she knows her name and she knows that she is in the hospital. When asked why she states because she is to be checked out but does not know why. She is unable to tell you what day of the week or month it is.  Past Medical History  Diagnosis Date  . HTN (hypertension)   . Tremors of nervous system     benign familial  . Transient global amnesia     AMS  . Atrial tachycardia   . Jaundice due to hepatitis     at age 67 or 74   Past Surgical History  Procedure Laterality Date  . Back surgery      years ago  . Abdominal hernia repair    . Tonsillectomy     Family History  Problem Relation Age of Onset  . Lung cancer Mother    History  Substance Use Topics  . Smoking status: Never Smoker   . Smokeless tobacco: Not on file     Comment: + prior 2nd hand exposure from spouse smoking  . Alcohol Use: No   OB History    No data available     Review of Systems  Unable to perform ROS: Mental status change      Allergies  Aspirin; Codeine; Hydrochlorothiazide; Penicillins; and Sulfonamide derivatives  Home Medications   Prior to Admission medications   Medication Sig Start Date End Date Taking? Authorizing Provider  amLODipine (NORVASC) 10 MG tablet TAKE 1 TABLET DAILY 11/10/13   Minus Breeding, MD  cloNIDine (CATAPRES) 0.3 MG tablet TAKE 1 TABLET TWICE A DAY 06/26/14   Minus Breeding, MD  metoprolol tartrate (LOPRESSOR) 25 MG tablet TAKE 1/2 TABLET TWICE A DAY    Minus Breeding, MD   spironolactone (ALDACTONE) 50 MG tablet TAKE 1 TABLET DAILY    Minus Breeding, MD   There were no vitals taken for this visit. Physical Exam  Constitutional: She appears well-developed and well-nourished.  HENT:  Head: Normocephalic and atraumatic.  Right Ear: External ear normal.  Left Ear: External ear normal.  Nose: Nose normal.  Eyes: EOM are normal. Pupils are equal, round, and reactive to light. Right eye exhibits no discharge. Left eye exhibits no discharge.  Cardiovascular: Normal rate, regular rhythm and normal heart sounds.   Pulmonary/Chest: Effort normal and breath sounds normal.  Abdominal: Soft. She exhibits no distension. There is no tenderness.  Neurological: She is alert. GCS eye subscore is 4. GCS verbal subscore is 4. GCS motor subscore is 6.  Speech is clear but she is disoriented. Unable remember short term. CN 2-12 grossly intact. 5/5 strength in all 4 extremities  Skin: Skin is warm and dry.  Nursing note and vitals reviewed.   ED Course  Procedures (including critical care time) Labs Review Labs Reviewed  COMPREHENSIVE METABOLIC PANEL - Abnormal; Notable for the following:    CO2 20 (*)    Glucose, Bld 113 (*)    All other components within normal limits  I-STAT CHEM 8,  ED - Abnormal; Notable for the following:    Glucose, Bld 112 (*)    Calcium, Ion 1.07 (*)    Hemoglobin 15.3 (*)    All other components within normal limits  ETHANOL  PROTIME-INR  APTT  CBC  DIFFERENTIAL  URINE RAPID DRUG SCREEN, HOSP PERFORMED  URINALYSIS, ROUTINE W REFLEX MICROSCOPIC (NOT AT [facility])  I-STAT TROPOININ, ED    Imaging Review Ct Head Wo Contrast  07/25/2014   CLINICAL DATA:  Altered mental status and confusion beginning today.  EXAM: CT HEAD WITHOUT CONTRAST  TECHNIQUE: Contiguous axial images were obtained from the base of the skull through the vertex without intravenous contrast.  COMPARISON:  Head CT scan 01/10/2007.  Brain MRI 01/11/2007.  FINDINGS: Cerebellar  atrophy is unchanged in appearance. No evidence of acute intracranial abnormality including hemorrhage, infarct, mass lesion, mass effect, midline shift or abnormal extra-axial fluid collection is identified. There is no hydrocephalus or pneumocephalus. Tiny amount of fluid in the left mastoid air cells is noted.  IMPRESSION: No acute abnormality.   Electronically Signed   By: Inge Rise M.D.   On: 07/25/2014 13:15     EKG Interpretation   Date/Time:  Tuesday July 25 2014 13:12:28 EDT Ventricular Rate:  86 PR Interval:  195 QRS Duration: 102 QT Interval:  398 QTC Calculation: 476 R Axis:   86 Text Interpretation:  Sinus rhythm Consider left atrial enlargement  Borderline right axis deviation Low voltage, precordial leads Borderline  repolarization abnormality Baseline wander in lead(s) II III aVR aVL aVF  V1 V2 V3 no significant change since 2011 Confirmed by Caylen Kuwahara  MD, Kana Reimann  ([id number]) on 07/25/2014 3:48:31 PM      MDM   Final diagnoses:  Transient global amnesia    Patient's symptoms consistent with TGA. She has no other neuro deficits. Dr. Leonel Ramsay has evaluate patient and agrees with the diagnosis. However patient is not back to normal after a couple hours in the ER and he recommends overnight observation with medicine due to her acute confusion.    Sherwood Gambler, MD 07/25/14 [phone]

## 2014-07-25 NOTE — ED Notes (Signed)
Pt. From home with activated code stroke. Pt. LKW by husband when he left house at [place]. Husband returned home at 1145 to find wife acting abnormal with repetitive speech and poor memory and generalized tremors out of normal for pt. Pt. Unable to answer all questions or remember recently asked questions. Pt. With hx of afib, denies blood thinners. Pt. AxO x4.

## 2014-07-25 NOTE — Procedures (Signed)
ELECTROENCEPHALOGRAM REPORT  Patient: [name] [name]       Room #: ED_B17 EEG No. [id number] Age: 81 y.o.        Sex: female Referring Physician: Tyron Russell Report Date:  07/25/2014        Interpreting Physician: Anthony Sar  History: [name] [name] is an 81 y.o. female with a history of hypertension and transient global amnesia resenting with recurrent acute short-term memory difficulty.  Indications for study:  Rule out encephalopathy; rule out focal seizure activity.  Technique: This is an 18 channel routine scalp EEG performed at the bedside with bipolar and monopolar montages arranged in accordance to the international 10/20 system of electrode placement.   Description: This EEG recording was performed during wakefulness. Predominant activity consisted of 10  Hz alpha rhythm with good attenuation with eye opening. Photic stimulation was not performed.  Hyperventilation was not performed. No epileptiform discharges were recorded. There was no abnormal slowing of cerebral activity.  Interpretation: This is a normal EEG recording during wakefulness. No evidence of an epileptic disorder was demonstrated. However, a normal EEG recording in and of itself does not rule out seizure disorder.   Rush Farmer M.D. [facility] [phone]

## 2014-07-25 NOTE — ED Notes (Signed)
Pt. Attempted to use bedpan, unable to provide urine at this time. Pt. Transporting to EEG. Will attempt again on return.

## 2014-07-25 NOTE — Consult Note (Addendum)
Neurology Consultation Reason for Consult: Memory difficulty Referring Physician: Tyron Russell  CC: Memory difficulties  History is obtained from: Patient  HPI: [name] [name] is a 81 y.o. female with a history of a previous episode of transient global amnesia that occurred in 2008 who presents with memory difficulty. Her husband states that she was in her normal state when she ate breakfast this morning, but subsequently he left for the doctor when he returned he found her cleaning something that she would normally be cleaning. He noticed that she had repetitive speech asking the same questions over and over and therefore called 911.  On arrival to the emergency room, I introduced myself as well as the ER physician. I asked her to repeat the ER physician's name several times after reading it and she did so easily. She was then taken to CT scan as per the code stroke protocol. Prior to the CT scan I asked her if she had seen any other physicians besides myself and she said no. I then asked her to remember my name as well as me. The CT scan was performed and subsequently I reentered the room and asked her if I had met her before and she stated that she did not think so.  Subsequently when I introduced myself and told her my name was Dr. Leonel Ramsay, she stated "I will remember that" because she knew saw previously with that name. Subsequently when I reintroduce myself, she again stated "I will remember that" giving the same reason.  Throughout my interactions with her, she never seemed confused other than the memory deficit and was able to answer all my questions appropriately.   LKW: 7:30 AM tpa given?: no, not stroke    ROS: A 14 point ROS was performed and is negative except as noted in the HPI.   Past Medical History  Diagnosis Date  . HTN (hypertension)   . Tremors of nervous system     benign familial  . Transient global amnesia     AMS  . Atrial tachycardia   . Jaundice due to  hepatitis     at age 58 or 7    Family History: No history of similar  Social History: Tob: Denies  Exam: Current vital signs: BP 166/98 mmHg  Pulse 77  Temp(Src) 98.2 F (36.8 C) (Oral)  Resp 27  Ht '5\' 4"'  (1.626 m)  Wt 82.555 kg (182 lb)  BMI 31.22 kg/m2  SpO2 95% Vital signs in last 24 hours: Temp:  [98 F (36.7 C)-98.2 F (36.8 C)] 98.2 F (36.8 C) (06/14 1506) Pulse Rate:  [67-82] 77 (06/14 1630) Resp:  [17-27] 27 (06/14 1630) BP: (137-166)/(66-98) 166/98 mmHg (06/14 1630) SpO2:  [95 %-100 %] 95 % (06/14 1630) Weight:  [82.555 kg (182 lb)] 82.555 kg (182 lb) (06/14 1321)   Physical Exam  Constitutional: Appears well-developed and well-nourished.  Psych: Affect appropriate to situation Eyes: No scleral injection HENT: No OP obstrucion Head: Normocephalic.  Cardiovascular: Normal rate and regular rhythm.  Respiratory: Effort normal and breath sounds normal to anterior ascultation GI: Soft.  No distension. There is no tenderness.  Skin: WDI  Neuro: Mental Status: Patient is awake, alert, oriented to person. She is unable to give her correct age or month. She is able to readily answer questions, registration seems unaffected but her short-term memory is essentially nonexistent. No signs of aphasia or neglect Cranial Nerves: II: Visual Fields are full. Pupils are equal, round, and reactive to light.  III,IV, VI: EOMI without ptosis or diploplia.  V: Facial sensation is symmetric to temperature VII: Facial movement is symmetric.  VIII: hearing is intact to voice X: Uvula elevates symmetrically XI: Shoulder shrug is symmetric. XII: tongue is midline without atrophy or fasciculations.  Motor: Tone is normal. Bulk is normal. 5/5 strength was present in all four extremities.  Sensory: Sensation is symmetric to light touch and temperature in the arms and legs. Deep Tendon Reflexes: 2+ and symmetric in the biceps and patellae.  Cerebellar: She has a prominent  intentional tremor bilaterally   I have reviewed labs in epic and the results pertinent to this consultation are: CMP-unremarkable  I have reviewed the images obtained: CT head-no acute findings  Impression: 81 year old female with recurrent transient global amnesia. She did not have an EEG done with her previous hospitalization and I would favor repeating this, but if this is negative then I do not think that any further evaluation for her TGA is needed. MRI could be considered, though I find it not to be helpful typically. Of note, it can show diffusion changes in the hippocampus which can be read as stroke, though in this setting would be much more likely to be TGA.  I also discussed with her treatment of her essential tremor at the request of her daughter, however the patient indicates that she has not been bothered by her tremor and would not want to pursue medication therapy for this. Her husband agrees that she does not seem to be bothered by it.  Recommendations: 1) EEG 2) if EEG is negative, then no further workup is necessary. 3) I would favor observation until the patient returns to baseline as memory deficits this severe can certainly be dangerous.   Roland Rack, MD [facility] [phone]  If 7pm- 7am, please page neurology on call as listed in [place].

## 2014-07-25 NOTE — Code Documentation (Signed)
81yo female arriving to [facility] via [place] at 49.  EMS reports that the patient was LKW at [place] by her husband when he left the house.  When he returned at 1145 the patient was found to be confused with repetitive speech.  EMS activated a Code Stroke.  Stroke team at the bedside on arrival.  Labs drawn and patient cleared by Dr. Regenia Skeeter.  Patient to CT.  NIHSS 3, see documentation for details and code stroke times.  Patient missed both questions and has slight facial assymetry at rest.  Dr. Leonel Ramsay at the bedside and patient with likely transient global amnesia.  Of note, patient with h/o TGA 5 years ago.  No acute stroke treatment at this time.  Code stroke canceled at 1321.  Bedside handoff with ED RN Martie Round.

## 2014-07-25 NOTE — Telephone Encounter (Signed)
Pt's daughter called wanting [place] to know her mother admitted for global amnesia.  She wanted Dr. Percival Spanish to know as he is her medical MD as well as cardiologist.  I explained that IM was admitting and neuro was following and if they needed [place] to see they would consult, also I could not make appt for follow up after 5pm.  I do see pt has appt 08/23/14 with Dr. Vita Barley.  If she needs to be seen earlier then we will schedule APP appt.  I also spoke with [facility] and they will call us if needed. Will send note to Dr. Percival Spanish also

## 2014-07-26 DIAGNOSIS — G454 Transient global amnesia: Secondary | ICD-10-CM | POA: Diagnosis not present

## 2014-07-26 DIAGNOSIS — N390 Urinary tract infection, site not specified: Secondary | ICD-10-CM | POA: Diagnosis present

## 2014-07-26 DIAGNOSIS — I10 Essential (primary) hypertension: Secondary | ICD-10-CM | POA: Diagnosis not present

## 2014-07-26 LAB — CBC
HCT: 39.6 % (ref 36.0–46.0)
Hemoglobin: 13 g/dL (ref 12.0–15.0)
MCH: 27.7 pg (ref 26.0–34.0)
MCHC: 32.8 g/dL (ref 30.0–36.0)
MCV: 84.4 fL (ref 78.0–100.0)
Platelets: 343 10*3/uL (ref 150–400)
RBC: 4.69 MIL/uL (ref 3.87–5.11)
RDW: 13.3 % (ref 11.5–15.5)
WBC: 6.4 10*3/uL (ref 4.0–10.5)

## 2014-07-26 LAB — BASIC METABOLIC PANEL
Anion gap: 8 (ref 5–15)
BUN: 10 mg/dL (ref 6–20)
CALCIUM: 8.9 mg/dL (ref 8.9–10.3)
CO2: 26 mmol/L (ref 22–32)
CREATININE: 0.7 mg/dL (ref 0.44–1.00)
Chloride: 105 mmol/L (ref 101–111)
GFR calc Af Amer: >60 mL/min (ref >60)
GFR calc non Af Amer: >60 mL/min (ref >60)
Glucose, Bld: 104 mg/dL — ABNORMAL HIGH (ref 65–99)
Potassium: 3.7 mmol/L (ref 3.5–5.1)
Sodium: 139 mmol/L (ref 135–145)

## 2014-07-26 MED ORDER — NITROFURANTOIN MONOHYD MACRO 100 MG PO CAPS: 100.0000 mg | ORAL_CAPSULE | Freq: Two times a day (BID) | ORAL | Status: DC

## 2014-07-26 NOTE — Discharge Summary (Signed)
Physician Discharge Summary  [name] [name] XBJ:[id number] DOB: [birth year] DOA: 07/25/2014  PCP: No PCP Per Patient  Admit date: 07/25/2014 Discharge date: 07/26/2014   Recommendations for Outpatient Follow-up:  1.   Discharge Diagnoses:  Principal Problem:   AMNESIA, TRANSIENT GLOBAL Active Problems:   Essential hypertension, benign   TGA (transient global amnesia)   Discharge Condition: stable  Diet recommendation: heart healthy  Filed Weights   07/25/14 1321  Weight: 82.555 kg (182 lb)    History of present illness:  81 y.o. female with a history of a previous episode of transient global amnesia that occurred in 2008 who presents with memory difficulty. Her husband states that she was in her normal state when she ate breakfast this morning, but subsequently he left for the doctor when he returned he found her cleaning something that she would normally be cleaning. He noticed that she had repetitive speech asking the same questions over and over and therefore called 911. In ED, found to have UTI  Hospital Course:  Observed on telemetry. Neurology consulted. EEG negative.  Brain imaging without anything acute.  Felt to be transient global ischemia.  Started on short course antibiotic in case uti contributing to neuro symptoms. By discharge, back to baseline  Procedures:  none  Consultations:  neurology  Discharge Exam: Filed Vitals:   07/26/14 0900  BP: 113/50  Pulse: 64  Temp: 98 F (36.7 C)  Resp: 18    General: a and o Cardiovascular: RRR Respiratory: CTA  Discharge Instructions   Discharge Instructions    Diet - low sodium heart healthy    Complete by:  As directed      Increase activity slowly    Complete by:  As directed           Current Discharge Medication List    START taking these medications   Details  nitrofurantoin, macrocrystal-monohydrate, (MACROBID) 100 MG capsule Take 1 capsule (100 mg total) by mouth every 12 (twelve)  hours. Qty: 10 capsule, Refills: 0      CONTINUE these medications which have NOT CHANGED   Details  amLODipine (NORVASC) 10 MG tablet TAKE 1 TABLET DAILY Qty: 90 tablet, Refills: 3    cloNIDine (CATAPRES) 0.3 MG tablet TAKE 1 TABLET TWICE A DAY Qty: 180 tablet, Refills: 0    metoprolol tartrate (LOPRESSOR) 25 MG tablet TAKE 1/2 TABLET TWICE A DAY Qty: 90 tablet, Refills: 3    spironolactone (ALDACTONE) 50 MG tablet TAKE 1 TABLET DAILY Qty: 90 tablet, Refills: 3       Allergies  Allergen Reactions  . Aspirin   . Codeine   . Hydrochlorothiazide   . Penicillins   . Sulfonamide Derivatives       The results of significant diagnostics from this hospitalization (including imaging, microbiology, ancillary and laboratory) are listed below for reference.    Significant Diagnostic Studies: Ct Head Wo Contrast  07/25/2014   CLINICAL DATA:  Altered mental status and confusion beginning today.  EXAM: CT HEAD WITHOUT CONTRAST  TECHNIQUE: Contiguous axial images were obtained from the base of the skull through the vertex without intravenous contrast.  COMPARISON:  Head CT scan 01/10/2007.  Brain MRI 01/11/2007.  FINDINGS: Cerebellar atrophy is unchanged in appearance. No evidence of acute intracranial abnormality including hemorrhage, infarct, mass lesion, mass effect, midline shift or abnormal extra-axial fluid collection is identified. There is no hydrocephalus or pneumocephalus. Tiny amount of fluid in the left mastoid air cells is noted.  IMPRESSION: No acute  abnormality.   Electronically Signed   By: Inge Rise M.D.   On: 07/25/2014 13:15   Mr Brain Wo Contrast  07/25/2014   CLINICAL DATA:  Transient amnesia  EXAM: MRI HEAD WITHOUT CONTRAST  TECHNIQUE: Multiplanar, multiecho pulse sequences of the brain and surrounding structures were obtained without intravenous contrast.  COMPARISON:  CT head 07/25/2014, MRI 01/11/2007  FINDINGS: Mild cerebral atrophy and moderate cerebellar  atrophy similar to 2008. Negative for hydrocephalus. Pituitary normal in size. Cervical medullary junction normal.  Negative for acute infarct. Minimal chronic microvascular ischemic changes in the white matter. Brainstem intact. Benign cyst right basal ganglia unchanged.  Negative for intracranial hemorrhage  Negative for mass or edema.  No shift of the midline structures.  IMPRESSION: No acute abnormality and no change from 2008.  Mild cerebral atrophy and moderate cerebellar atrophy.   Electronically Signed   By: Franchot Gallo M.D.   On: 07/25/2014 19:22    Microbiology: No results found for this or any previous visit (from the past 240 hour(s)).   Labs: Basic Metabolic Panel:  Recent Labs Lab 07/25/14 1258 07/25/14 1303 07/26/14 0346  NA 137 137 139  K 3.9 3.9 3.7  CL 104 105 105  CO2 20*  --  26  GLUCOSE 113* 112* 104*  BUN 16 19 10   CREATININE 0.79 0.70 0.70  CALCIUM 9.1  --  8.9   Liver Function Tests:  Recent Labs Lab 07/25/14 1258  AST 25  ALT 15  ALKPHOS 107  BILITOT 0.7  PROT 7.4  ALBUMIN 4.0   No results for input(s): LIPASE, AMYLASE in the last 168 hours. No results for input(s): AMMONIA in the last 168 hours. CBC:  Recent Labs Lab 07/25/14 1258 07/25/14 1303 07/26/14 0346  WBC 6.9  --  6.4  NEUTROABS 4.1  --   --   HGB 13.7 15.3* 13.0  HCT 41.8 45.0 39.6  MCV 84.4  --  84.4  PLT 350  --  343   Cardiac Enzymes: No results for input(s): CKTOTAL, CKMB, CKMBINDEX, TROPONINI in the last 168 hours. BNP: BNP (last 3 results) No results for input(s): BNP in the last 8760 hours.  ProBNP (last 3 results) No results for input(s): PROBNP in the last 8760 hours.  CBG: No results for input(s): GLUCAP in the last 168 hours.     SignedDelfina Redwood  [facility] 07/26/2014, 10:34 AM

## 2014-07-26 NOTE — Progress Notes (Signed)
Patient is being d/c home. D/c instructions given and patient verbalized understanding. 

## 2014-07-26 NOTE — Progress Notes (Signed)
Subjective: The patient's husband and 2 daughters are at the bedside. Multiple questions answered. The patient feels she is back to baseline. The one daughter feels that the patient has chronic anxiety and probably obsessive-compulsive disorder. She feels it recent stress in the patient's schedule may have contributed to her symptoms. The patient had a previous episode in 2008 which the family feels was much worse than this recent event. The family is also concerned that she may have a urinary tract infection. The patient has not been established with a neurologist; however, the patient's family would like her to follow-up with Dr. Jaynee Eagles.  Objective: Current vital signs: BP 154/64 mmHg  Pulse 59  Temp(Src) 97.7 F (36.5 C) (Oral)  Resp 20  Ht 5\' 4"  (1.626 m)  Wt 82.555 kg (182 lb)  BMI 31.22 kg/m2  SpO2 98% Vital signs in last 24 hours: Temp:  [97.7 F (36.5 C)-98.4 F (36.9 C)] 97.7 F (36.5 C) (06/15 0504) Pulse Rate:  [55-82] 59 (06/15 0504) Resp:  [17-27] 20 (06/15 0504) BP: (111-166)/(54-98) 154/64 mmHg (06/15 0504) SpO2:  [95 %-100 %] 98 % (06/15 0504) Weight:  [82.555 kg (182 lb)] 82.555 kg (182 lb) (06/14 1321)  Intake/Output from previous day:   Intake/Output this shift: Total I/O In: 240 [P.O.:240] Out: -  Nutritional status: Diet Heart Room service appropriate?: Yes; Fluid consistency:: Thin  Physical Exam  General - pleasant 81 year old female with subtle tremor shaking-like movements of the head. Heart - Regular rate and rhythm - no murmer Lungs - Clear to auscultation Abdomen - Soft - non tender Extremities - Distal pulses intact - 1-2+ pitting edema bilaterally edema Skin - Warm and dry  Neurologic Exam:  MENTAL STATUS: awake, alert, Language fluent Follows simple commands. Naming intact. Completely oriented and able to answer simple math questions correctly. CRANIAL NERVES: pupils equal and reactive to light, extraocular muscles intact, facial sensation  and strength symmetric, tongue midline. MOTOR: normal bulk and tone, full strength in the BUE, BLE  SENSORY: normal and symmetric to light touch  COORDINATION: finger-nose-finger normal  GAIT/STATION: Deferred   Lab Results: Basic Metabolic Panel:  Recent Labs Lab 07/25/14 1258 07/25/14 1303 07/26/14 0346  NA 137 137 139  K 3.9 3.9 3.7  CL 104 105 105  CO2 20*  --  26  GLUCOSE 113* 112* 104*  BUN 16 19 10   CREATININE 0.79 0.70 0.70  CALCIUM 9.1  --  8.9    Liver Function Tests:  Recent Labs Lab 07/25/14 1258  AST 25  ALT 15  ALKPHOS 107  BILITOT 0.7  PROT 7.4  ALBUMIN 4.0   No results for input(s): LIPASE, AMYLASE in the last 168 hours. No results for input(s): AMMONIA in the last 168 hours.  CBC:  Recent Labs Lab 07/25/14 1258 07/25/14 1303 07/26/14 0346  WBC 6.9  --  6.4  NEUTROABS 4.1  --   --   HGB 13.7 15.3* 13.0  HCT 41.8 45.0 39.6  MCV 84.4  --  84.4  PLT 350  --  343    Cardiac Enzymes: No results for input(s): CKTOTAL, CKMB, CKMBINDEX, TROPONINI in the last 168 hours.  Lipid Panel: No results for input(s): CHOL, TRIG, HDL, CHOLHDL, VLDL, LDLCALC in the last 168 hours.  CBG: No results for input(s): GLUCAP in the last 168 hours.  Microbiology: No results found for this or any previous visit.  Coagulation Studies:  Recent Labs  07/25/14 1258  LABPROT 14.6  INR 1.12  Imaging:  Ct Head Wo Contrast 07/25/2014    No acute abnormality.   Mr Brain Wo Contrast 07/25/2014    No acute abnormality and no change from 2008.  Mild cerebral atrophy and moderate cerebellar atrophy.     EEG  07/25/2014 Normal     Medications:  Scheduled: . amLODipine  10 mg Oral Daily  . cloNIDine  0.3 mg Oral BID  . enoxaparin (LOVENOX) injection  40 mg Subcutaneous Q24H  . metoprolol tartrate  12.5 mg Oral BID  . nitrofurantoin (macrocrystal-monohydrate)  100 mg Oral Q12H  . sodium chloride  3 mL Intravenous Q12H  . spironolactone  50 mg  Oral Daily    Assessment/Plan:   Probable discharge later today. Transient global amnesia resolved. She is scheduled to follow-up with Dr. Warren Lacy her cardiologist to discuss her medications. She would like a follow-up appointment with Dr. Jaynee Eagles. Discussed with Dr. Conley Canal.   Mikey Bussing PA-C [facility] Pager [phone] 07/26/2014, 10:15 AM  I have seen and evaluated the patient. I have reviewed the above note and made appropriate changes.   Back to baseline. Very characteristic of TGA. About 5% of patients with TGA will have relapse. I do nto think any modifications to her current therapy are needed based on this episode. No need to follow up with neurology unless patient wated second opinion.   No further recommendations at this time.   Roland Rack, MD [facility] [phone]  If 7pm- 7am, please page neurology on call as listed in [place].

## 2014-07-26 NOTE — Telephone Encounter (Signed)
Please move this patient up in the schedule.

## 2014-08-03 DIAGNOSIS — G454 Transient global amnesia: Secondary | ICD-10-CM | POA: Diagnosis not present

## 2014-08-03 DIAGNOSIS — N390 Urinary tract infection, site not specified: Secondary | ICD-10-CM | POA: Diagnosis not present

## 2014-08-03 DIAGNOSIS — R21 Rash and other nonspecific skin eruption: Secondary | ICD-10-CM | POA: Diagnosis not present

## 2014-08-07 ENCOUNTER — Other Ambulatory Visit: Payer: Self-pay

## 2014-08-10 ENCOUNTER — Other Ambulatory Visit: Payer: Self-pay | Admitting: Cardiology

## 2014-08-17 DIAGNOSIS — H1131 Conjunctival hemorrhage, right eye: Secondary | ICD-10-CM | POA: Diagnosis not present

## 2014-08-23 ENCOUNTER — Encounter: Payer: Self-pay | Admitting: Cardiology

## 2014-08-23 ENCOUNTER — Ambulatory Visit (INDEPENDENT_AMBULATORY_CARE_PROVIDER_SITE_OTHER): Payer: MEDICARE | Admitting: Cardiology

## 2014-08-23 VITALS — BP 156/74 | HR 67 | Ht 63.0 in | Wt 181.5 lb

## 2014-08-23 DIAGNOSIS — I10 Essential (primary) hypertension: Secondary | ICD-10-CM | POA: Diagnosis not present

## 2014-08-23 NOTE — Patient Instructions (Signed)
Your physician wants you to follow-up in: 1 Year. You will receive a reminder letter in the mail two months in advance. If you don't receive a letter, please call our office to schedule the follow-up appointment.  

## 2014-08-23 NOTE — Progress Notes (Signed)
   HPI The patient presents for follow up of HTN. Since I last saw her she was hospitalized again with transient global amnesia.  There was again no etiology identified.  She otherwise is at baseline.  She does sleep a lot because of her clonidine.  Her BPs have been OK. She's having no new palpitations, presyncope or syncope. She's having no chest discomfort, neck or arm discomfort. She's had no weight gain.  She is under stress as her daughter has melanoma in her brain although she is responding to therapy.   Allergies  Allergen Reactions  . Aspirin   . Codeine   . Hydrochlorothiazide   . Penicillins   . Sulfonamide Derivatives     Current Outpatient Prescriptions  Medication Sig Dispense Refill  . amLODipine (NORVASC) 10 MG tablet TAKE 1 TABLET DAILY 90 tablet 3  . cloNIDine (CATAPRES) 0.3 MG tablet TAKE 1 TABLET TWICE A DAY 180 tablet 0  . metoprolol tartrate (LOPRESSOR) 25 MG tablet TAKE 1/2 TABLET TWICE A DAY 90 tablet 3  . spironolactone (ALDACTONE) 50 MG tablet TAKE 1 TABLET DAILY 90 tablet 3  . triamcinolone cream (KENALOG) 0.1 % Apply 1 application topically 2 (two) times daily.  1   No current facility-administered medications for this visit.    Past Medical History  Diagnosis Date  . HTN (hypertension)   . Tremors of nervous system     benign familial  . Transient global amnesia     AMS  . Atrial tachycardia   . Jaundice due to hepatitis     at age 22 or 75    Past Surgical History  Procedure Laterality Date  . Back surgery      years ago  . Abdominal hernia repair    . Tonsillectomy      ROS:  As stated in the HPI and negative for all other systems.  PHYSICAL EXAM BP 156/74 mmHg  Pulse 67  Ht 5\' 3"  (1.6 m)  Wt 181 lb 8 oz (82.328 kg)  BMI 32.16 kg/m2 GENERAL:  Well appearing NECK:  No jugular venous distention, waveform within normal limits, carotid upstroke brisk and symmetric, no bruits, no thyromegaly LUNGS:  Clear to auscultation  bilaterally CHEST:  Unremarkable HEART:  PMI not displaced or sustained,S1 and S2 within normal limits, no S3, no S4, no clicks, no rubs, no murmurs ABD:  Flat, positive bowel sounds normal in frequency in pitch, no bruits, no rebound, no guarding, no midline pulsatile mass, no hepatomegaly, no splenomegaly EXT:  2 plus pulses throughout, right greater than left non pitting swelling, no cyanosis no clubbing NEURO:  Tremor   ASSESSMENT AND PLAN   ESSENTIAL HYPERTENSION, BENIGN -  The blood pressure is at target. No change in medications is indicated. We will continue with therapeutic lifestyle changes (TLC).  We did talk about her clonidine causing fatigue but she does not want to switch meds or reduce the dose because it his controlling her BP.    PALPITATIONS -  She does not notice this. No change in therapy is indicated.

## 2014-08-24 DIAGNOSIS — H04013 Acute dacryoadenitis, bilateral lacrimal glands: Secondary | ICD-10-CM | POA: Diagnosis not present

## 2014-08-24 DIAGNOSIS — H524 Presbyopia: Secondary | ICD-10-CM | POA: Diagnosis not present

## 2014-08-24 DIAGNOSIS — H2513 Age-related nuclear cataract, bilateral: Secondary | ICD-10-CM | POA: Diagnosis not present

## 2014-09-05 ENCOUNTER — Other Ambulatory Visit: Payer: Self-pay

## 2014-09-05 MED ORDER — CLONIDINE HCL 0.3 MG PO TABS: 0.3000 mg | ORAL_TABLET | Freq: Two times a day (BID) | ORAL | Status: DC

## 2014-10-18 DIAGNOSIS — N816 Rectocele: Secondary | ICD-10-CM | POA: Diagnosis not present

## 2014-10-18 DIAGNOSIS — N8111 Cystocele, midline: Secondary | ICD-10-CM | POA: Diagnosis not present

## 2014-11-03 ENCOUNTER — Other Ambulatory Visit: Payer: Self-pay

## 2014-11-03 MED ORDER — AMLODIPINE BESYLATE 10 MG PO TABS: 10.0000 mg | ORAL_TABLET | Freq: Every day | ORAL | Status: DC

## 2014-11-03 NOTE — Telephone Encounter (Signed)
Minus Breeding, MD at 08/23/2014 12:26 PM  amLODipine (NORVASC) 10 MG tabletTAKE 1 TABLET DAILY ESSENTIAL HYPERTENSION, BENIGN -  The blood pressure is at target. No change in medications is indicated. We will continue with therapeutic lifestyle changes (TLC). We did talk about her clonidine causing fatigue but she does not want to switch meds or reduce the dose because it his controlling her BP.

## 2014-12-15 DIAGNOSIS — H6693 Otitis media, unspecified, bilateral: Secondary | ICD-10-CM | POA: Diagnosis not present

## 2015-01-12 ENCOUNTER — Telehealth: Payer: Self-pay | Admitting: Cardiology

## 2015-01-12 NOTE — Telephone Encounter (Signed)
New Message  Pt daughter calling to speak w/ RN concerning recent symptoms- weakness, feeling lethargic, dizzy, and sleeping a lot. Pt has appointment for 01/19/15 w/ Dr Percival Spanish, but pt daughter wanted to discuss her symptoms today. Please call back and discuss.

## 2015-01-12 NOTE — Telephone Encounter (Signed)
Returned call to daughter who expressed concern. Pt has been having ongoing symptoms she feels are med related.  She has had lethargy, what daughter notes to be increased sleepiness over past 3-4 months. She notes none of this is recent and no worsening of these symptoms recently. She does state, however, that pt has had some ankle swelling. No SOB. Pt has been advised in past to elevate feet and this usually resolves the swelling.  Pt is taking medications as reported. Daughter requesting if dose adjustments recommended, to inform her of instructions of use. Asking if any meds need to be reduced. Spoke w/ pt's husband also, who notes BPs 140-150/70-80 consistently over past few months. I advised to inform us of new symptoms/concerns.  Pt has appt to see Dr. Percival Spanish on 12/9. Informed daughter I would defer to Dr. Percival Spanish for any additional advice in advance of appt.

## 2015-01-13 NOTE — Telephone Encounter (Signed)
I will discuss at the upcoming appt next week.

## 2015-01-15 NOTE — Telephone Encounter (Signed)
Leave message on phone about her mom upcoming appt and concerns will be discuss at present time.

## 2015-01-19 ENCOUNTER — Telehealth: Payer: Self-pay | Admitting: Cardiology

## 2015-01-19 ENCOUNTER — Encounter: Payer: Self-pay | Admitting: Cardiology

## 2015-01-19 ENCOUNTER — Ambulatory Visit (INDEPENDENT_AMBULATORY_CARE_PROVIDER_SITE_OTHER): Payer: MEDICARE | Admitting: Cardiology

## 2015-01-19 VITALS — BP 142/70 | HR 85 | Ht 62.0 in | Wt 183.7 lb

## 2015-01-19 DIAGNOSIS — I10 Essential (primary) hypertension: Secondary | ICD-10-CM

## 2015-01-19 MED ORDER — CLONIDINE HCL 0.2 MG PO TABS: 0.2000 mg | ORAL_TABLET | ORAL | Status: DC

## 2015-01-19 NOTE — Patient Instructions (Addendum)
Your physician recommends that you schedule a follow-up appointment in: 3 Months  Your physician has recommended you make the following change in your medication: Decrease Clonidine 0.2 mg in the morning and 0.3 mg in the evening.   Merry Christmas and Happy New Year!!

## 2015-01-19 NOTE — Telephone Encounter (Signed)
Message left w/ prescription information and instructions to call back if further information required.

## 2015-01-19 NOTE — Telephone Encounter (Signed)
[facility] is calling to get Clarification on the Clonidine prescription .Marland Kitchen Please call   Thanks

## 2015-01-19 NOTE — Progress Notes (Signed)
   HPI The patient presents for follow up of HTN. Since I last saw her she was hospitalized again with transient global amnesia.  There was again no etiology identified.  She has been more fatigued lately. She sleeps during the day. Her daughter says that during the morning hours when she first wakes up she's fine and she gets more fatigued after taking her morning clonidine. She's had 3 episodes of weak legs. She doesn't get dizzy. Come over, she gets a little weak but these have been with episodes such as sitting prolonged time getting up out of a car , having to walk a long distance the flat tire.  She otherwise is at baseline.  Her BPs have been OK  And I reviewed this diary.  She's having no chest discomfort, neck or arm discomfort. She's had no weight gain.   Allergies  Allergen Reactions  . Aspirin Hives  . Codeine Other (See Comments)    REACTION: GI upset  . Hydrochlorothiazide Rash  . Penicillins Rash  . Sulfonamide Derivatives Rash    Current Outpatient Prescriptions  Medication Sig Dispense Refill  . amLODipine (NORVASC) 10 MG tablet Take 1 tablet (10 mg total) by mouth daily. 90 tablet 2  . cloNIDine (CATAPRES) 0.3 MG tablet Take 1 tablet (0.3 mg total) by mouth 2 (two) times daily. 180 tablet 2  . metoprolol tartrate (LOPRESSOR) 25 MG tablet TAKE 1/2 TABLET TWICE A DAY 90 tablet 3  . spironolactone (ALDACTONE) 50 MG tablet TAKE 1 TABLET DAILY 90 tablet 3   No current facility-administered medications for this visit.    Past Medical History  Diagnosis Date  . HTN (hypertension)   . Tremors of nervous system     benign familial  . Transient global amnesia     AMS  . Atrial tachycardia ([place])   . Jaundice due to hepatitis     at age 75 or 33    Past Surgical History  Procedure Laterality Date  . Back surgery      years ago  . Abdominal hernia repair    . Tonsillectomy      ROS:  As stated in the HPI and negative for all other systems.  PHYSICAL EXAM BP 142/70  mmHg  Pulse 85  Ht 5\' 2"  (1.575 m)  Wt 183 lb 11.2 oz (83.326 kg)  BMI 33.59 kg/m2  SpO2 97% GENERAL:  Well appearing NECK:  No jugular venous distention, waveform within normal limits, carotid upstroke brisk and symmetric, no bruits, no thyromegaly LUNGS:  Clear to auscultation bilaterally CHEST:  Unremarkable HEART:  PMI not displaced or sustained,S1 and S2 within normal limits, no S3, no S4, no clicks, no rubs, no murmurs ABD:  Flat, positive bowel sounds normal in frequency in pitch, no bruits, no rebound, no guarding, no midline pulsatile mass, no hepatomegaly, no splenomegaly EXT:  2 plus pulses throughout, right greater than left non pitting swelling, no cyanosis no clubbing NEURO:  Tremor   ASSESSMENT AND PLAN   ESSENTIAL HYPERTENSION, BENIGN -   Today I will decrease her clonidine to 0.2 mg in the morning and point 3 in the evening. She will remain on the other meds as listed.   She and her daughter will keep a check on the blood pressure will let me know how she is running.   PALPITATIONS -  She does not notice this. No change in therapy is indicated.

## 2015-01-26 DIAGNOSIS — N8111 Cystocele, midline: Secondary | ICD-10-CM | POA: Diagnosis not present

## 2015-03-01 DIAGNOSIS — H2513 Age-related nuclear cataract, bilateral: Secondary | ICD-10-CM | POA: Diagnosis not present

## 2015-03-01 DIAGNOSIS — H524 Presbyopia: Secondary | ICD-10-CM | POA: Diagnosis not present

## 2015-03-13 DIAGNOSIS — J329 Chronic sinusitis, unspecified: Secondary | ICD-10-CM | POA: Diagnosis not present

## 2015-03-13 DIAGNOSIS — R05 Cough: Secondary | ICD-10-CM | POA: Diagnosis not present

## 2015-04-16 DIAGNOSIS — Z9622 Myringotomy tube(s) status: Secondary | ICD-10-CM | POA: Diagnosis not present

## 2015-04-16 DIAGNOSIS — H9212 Otorrhea, left ear: Secondary | ICD-10-CM | POA: Diagnosis not present

## 2015-04-18 NOTE — Progress Notes (Signed)
   HPI The patient presents for follow up of HTN.  At the last visit she was having increasing fatigue.  I reduced her daytime clonidine and she has done better.  She is having less sleepiness.  Her BP is controlled.  The patient denies any new symptoms such as chest discomfort, neck or arm discomfort. There has been no new shortness of breath, PND or orthopnea. There have been no reported palpitations, presyncope or syncope.      Allergies  Allergen Reactions  . Aspirin Hives  . Codeine Other (See Comments)    REACTION: GI upset  . Hydrochlorothiazide Rash  . Penicillins Rash  . Sulfonamide Derivatives Rash  . Levofloxacin Other (See Comments)    Joint aches and weakness    Current Outpatient Prescriptions  Medication Sig Dispense Refill  . amLODipine (NORVASC) 10 MG tablet Take 1 tablet (10 mg total) by mouth daily. 90 tablet 2  . ciprofloxacin-dexamethasone (CIPRODEX) otic suspension     . cloNIDine (CATAPRES) 0.2 MG tablet Take 1 tablet (0.2 mg total) by mouth every morning. and 0.3 mg in evening 30 tablet 6  . cloNIDine (CATAPRES) 0.3 MG tablet Take 1 tablet (0.3 mg total) by mouth 2 (two) times daily. (Patient taking differently: Take 0.3 mg by mouth every evening. ) 180 tablet 2  . metoprolol tartrate (LOPRESSOR) 25 MG tablet TAKE 1/2 TABLET TWICE A DAY 90 tablet 3  . spironolactone (ALDACTONE) 50 MG tablet TAKE 1 TABLET DAILY 90 tablet 3   No current facility-administered medications for this visit.    Past Medical History  Diagnosis Date  . HTN (hypertension)   . Tremors of nervous system     benign familial  . Transient global amnesia     AMS  . Atrial tachycardia ([place])   . Jaundice due to hepatitis     at age 68 or 74    Past Surgical History  Procedure Laterality Date  . Back surgery      years ago  . Abdominal hernia repair    . Tonsillectomy      ROS:  As stated in the HPI and negative for all other systems.  PHYSICAL EXAM BP 147/70 mmHg  Pulse 58   Ht 5\' 3"  (1.6 m)  Wt 178 lb (80.74 kg)  BMI 31.54 kg/m2 GENERAL:  Well appearing NECK:  No jugular venous distention, waveform within normal limits, carotid upstroke brisk and symmetric, no bruits, no thyromegaly LUNGS:  Clear to auscultation bilaterally CHEST:  Unremarkable HEART:  PMI not displaced or sustained,S1 and S2 within normal limits, no S3, no S4, no clicks, no rubs, no murmurs ABD:  Flat, positive bowel sounds normal in frequency in pitch, no bruits, no rebound, no guarding, no midline pulsatile mass, no hepatomegaly, no splenomegaly EXT:  2 plus pulses throughout, right greater than left non pitting swelling, no cyanosis no clubbing NEURO:  Tremor mild    ASSESSMENT AND PLAN   ESSENTIAL HYPERTENSION, BENIGN -  Her BP is controlled.  She will continue the meds as listed.   FATIGUE -  This is improved.  No change in therapy.

## 2015-04-19 ENCOUNTER — Encounter: Payer: Self-pay | Admitting: Cardiology

## 2015-04-19 ENCOUNTER — Ambulatory Visit (INDEPENDENT_AMBULATORY_CARE_PROVIDER_SITE_OTHER): Payer: MEDICARE | Admitting: Cardiology

## 2015-04-19 VITALS — BP 147/70 | HR 58 | Ht 63.0 in | Wt 178.0 lb

## 2015-04-19 DIAGNOSIS — I10 Essential (primary) hypertension: Secondary | ICD-10-CM

## 2015-04-19 NOTE — Patient Instructions (Signed)
Dr Percival Spanish recommends that you schedule a follow-up appointment in 6 months. You will receive a reminder letter in the mail two months in advance. If you don't receive a letter, please call our office to schedule the follow-up appointment.  If you need a refill on your cardiac medications before your next appointment, please call your pharmacy.

## 2015-04-20 DIAGNOSIS — N8111 Cystocele, midline: Secondary | ICD-10-CM | POA: Diagnosis not present

## 2015-08-06 ENCOUNTER — Other Ambulatory Visit: Payer: Self-pay

## 2015-08-06 MED ORDER — METOPROLOL TARTRATE 25 MG PO TABS: 12.5000 mg | ORAL_TABLET | Freq: Two times a day (BID) | ORAL | Status: DC

## 2015-08-06 MED ORDER — SPIRONOLACTONE 50 MG PO TABS: 50.0000 mg | ORAL_TABLET | Freq: Every day | ORAL | Status: DC

## 2015-08-06 NOTE — Telephone Encounter (Signed)
Rx(s) sent to pharmacy electronically.  

## 2015-08-08 ENCOUNTER — Telehealth: Payer: Self-pay | Admitting: Cardiology

## 2015-08-08 ENCOUNTER — Other Ambulatory Visit: Payer: Self-pay

## 2015-08-08 MED ORDER — METOPROLOL TARTRATE 25 MG PO TABS: 12.5000 mg | ORAL_TABLET | Freq: Two times a day (BID) | ORAL | Status: DC

## 2015-08-08 MED ORDER — SPIRONOLACTONE 50 MG PO TABS: 50.0000 mg | ORAL_TABLET | Freq: Every day | ORAL | Status: DC

## 2015-08-08 MED ORDER — AMLODIPINE BESYLATE 10 MG PO TABS: 10.0000 mg | ORAL_TABLET | Freq: Every day | ORAL | Status: DC

## 2015-08-08 NOTE — Telephone Encounter (Signed)
°*  STAT* If patient is at the pharmacy, call can be transferred to refill team.   1. Which medications need to be refilled? (please list name of each medication and dose if known)Metoprolol 25mg , Spironolactone 50mg   And Amlodipine 10mg    2. Which pharmacy/location (including street and city if local pharmacy) is medication to be sent to?[facility]  3. Do they need a 30 day or 90 day supply? 90  The Medication was sent to a the local pharmacy and they only use the [facility] when need to get prescription sooner than the mail order   Thanks

## 2015-08-10 DIAGNOSIS — N816 Rectocele: Secondary | ICD-10-CM | POA: Diagnosis not present

## 2015-08-10 DIAGNOSIS — N8111 Cystocele, midline: Secondary | ICD-10-CM | POA: Diagnosis not present

## 2015-08-17 DIAGNOSIS — H9212 Otorrhea, left ear: Secondary | ICD-10-CM | POA: Diagnosis not present

## 2015-08-17 DIAGNOSIS — T7840XD Allergy, unspecified, subsequent encounter: Secondary | ICD-10-CM | POA: Diagnosis not present

## 2015-08-17 DIAGNOSIS — T7840XA Allergy, unspecified, initial encounter: Secondary | ICD-10-CM | POA: Insufficient documentation

## 2015-09-20 DIAGNOSIS — H2513 Age-related nuclear cataract, bilateral: Secondary | ICD-10-CM | POA: Diagnosis not present

## 2015-09-20 DIAGNOSIS — H40013 Open angle with borderline findings, low risk, bilateral: Secondary | ICD-10-CM | POA: Diagnosis not present

## 2015-09-20 DIAGNOSIS — H524 Presbyopia: Secondary | ICD-10-CM | POA: Diagnosis not present

## 2015-10-04 ENCOUNTER — Encounter: Payer: Self-pay | Admitting: Cardiology

## 2015-10-05 ENCOUNTER — Other Ambulatory Visit: Payer: Self-pay | Admitting: Cardiology

## 2015-10-05 DIAGNOSIS — I10 Essential (primary) hypertension: Secondary | ICD-10-CM

## 2015-10-05 NOTE — Telephone Encounter (Signed)
Rx(s) sent to pharmacy electronically.  

## 2015-10-12 ENCOUNTER — Telehealth: Payer: Self-pay | Admitting: Cardiology

## 2015-10-12 NOTE — Telephone Encounter (Signed)
[name] [name] would like to speak with Dr. Percival Spanish before her mother's appointment on 10/18/15.  There are some issues she needs to discuss since she will not be able to come with her mother to her appointment.

## 2015-10-12 NOTE — Telephone Encounter (Signed)
Spoke to [place] (listed on DPR).  She expressed that she wished to communicate concerns ahead of patient's appt. Notes she won't be able to make it due to cancer treatments she has scheduled Thursday. Patient will be coming to appt w/ other family member.  States patient has continued to have lethargy; she reports pt has daytime sleepiness, sleeping long hours at night, and less interested in activities out of the home. She feels that this is related to the clonidine -- notes adjustments to med 6 mo's ago at last OV.  Notes also that the patient has had some recent decrease in mobility - she does not know if this is entirely fatigue related/med SE related, but feels worth addressing. She notes the patient gets overwhelmed when out of usual settings at home, and has more problems getting her bearings and walking.  She feels strongly that the patient would benefit from cane/walker, OT services, etc -- notes any recommendations from Dr. Percival Spanish would be welcomed, as the patient will "only do it if Dr. Percival Spanish tells her she needs it."  She is aware I will route her concerns to be followed up on or noted for appt -- she will welcome a call back from [facility] or Dr. Percival Spanish and may be reached by phone on Tuesday or Wednesday.

## 2015-10-16 NOTE — Progress Notes (Signed)
   HPI The patient presents for follow up of HTN.   Since I last saw her she thinks she's done well. Her blood pressures have been controlled. She says she has had trouble with problems with her balance and gait. She hasn't had any falls.  Prior to this appt her daughter called to let me know that her mom had decreased mobility.  She is overwhelmed when she is out of her usual setting and "has trouble getting her bearings."   She feels strongly that the patient needs a cane or walker or OT because of decreased gate.    Allergies  Allergen Reactions  . Aspirin Hives  . Codeine Other (See Comments)    REACTION: GI upset  . Hydrochlorothiazide Rash  . Penicillins Rash  . Sulfonamide Derivatives Rash  . Levofloxacin Other (See Comments)    Joint aches and weakness    Current Outpatient Prescriptions  Medication Sig Dispense Refill  . amLODipine (NORVASC) 10 MG tablet Take 1 tablet (10 mg total) by mouth daily. 90 tablet 2  . cloNIDine (CATAPRES) 0.2 MG tablet Take 0.2 mg by mouth 2 (two) times daily. Pt takes 1 tablet in the morning and 1.5 mg at bedtime    . metoprolol tartrate (LOPRESSOR) 25 MG tablet Take 0.5 tablets (12.5 mg total) by mouth 2 (two) times daily. 90 tablet 2  . spironolactone (ALDACTONE) 50 MG tablet Take 1 tablet (50 mg total) by mouth daily. 90 tablet 2   No current facility-administered medications for this visit.     Past Medical History:  Diagnosis Date  . Atrial tachycardia ([place])   . HTN (hypertension)   . Jaundice due to hepatitis    at age 3 or 50  . Transient global amnesia    AMS  . Tremors of nervous system    benign familial    Past Surgical History:  Procedure Laterality Date  . ABDOMINAL HERNIA REPAIR    . BACK SURGERY     years ago  . TONSILLECTOMY     ROS:   As stated in the HPI and negative for all other systems.  PHYSICAL EXAM BP (!) 158/72   Pulse 74   Ht 5\' 2"  (1.575 m)   Wt 176 lb 3.2 oz (79.9 kg)   BMI 32.23 kg/m  GENERAL:   Well appearing NECK:  No jugular venous distention, waveform within normal limits, carotid upstroke brisk and symmetric, no bruits, no thyromegaly LUNGS:  Clear to auscultation bilaterally CHEST:  Unremarkable HEART:  PMI not displaced or sustained,S1 and S2 within normal limits, no S3, no S4, no clicks, no rubs, no murmurs ABD:  Flat, positive bowel sounds normal in frequency in pitch, no bruits, no rebound, no guarding, no midline pulsatile mass, no hepatomegaly, no splenomegaly EXT:  2 plus pulses throughout, right greater than left non pitting swelling, no cyanosis no clubbing NEURO:  Tremor mild   EKG: Normal sinus rhythm, rate 74, axis within normal limits, intervals within normal limits, no acute ST-T wave changes. 10/18/2015  ASSESSMENT AND PLAN   ESSENTIAL HYPERTENSION, BENIGN -  Her BP is controlled.  She will continue the meds as listed.   DECREASED GATE - I have ordered home physical therapy consult  FATIGUE -  This is improved.  No change in therapy.

## 2015-10-16 NOTE — Telephone Encounter (Signed)
Spoke with pt daughter Marcie Bal letting her know Dr Percival Spanish is aware of her conversation with triage

## 2015-10-16 NOTE — Telephone Encounter (Signed)
Minus Breeding, MD  [name]        Please call her daughter and let her know that I got her message about her mom's upcoming appt.

## 2015-10-18 ENCOUNTER — Encounter: Payer: Self-pay | Admitting: Cardiology

## 2015-10-18 ENCOUNTER — Ambulatory Visit (INDEPENDENT_AMBULATORY_CARE_PROVIDER_SITE_OTHER): Payer: MEDICARE | Admitting: Cardiology

## 2015-10-18 VITALS — BP 158/72 | HR 74 | Ht 62.0 in | Wt 176.2 lb

## 2015-10-18 DIAGNOSIS — R269 Unspecified abnormalities of gait and mobility: Secondary | ICD-10-CM | POA: Diagnosis not present

## 2015-10-18 DIAGNOSIS — I10 Essential (primary) hypertension: Secondary | ICD-10-CM

## 2015-10-18 LAB — CBC
HCT: 28.9 % — ABNORMAL LOW (ref 35.0–45.0)
HEMOGLOBIN: 7.9 g/dL — AB (ref 11.7–15.5)
MCH: 17.4 pg — ABNORMAL LOW (ref 27.0–33.0)
MCHC: 27.4 g/dL — ABNORMAL LOW (ref 32.0–36.0)
MCV: 62.9 fL — ABNORMAL LOW (ref 80.0–100.0)
MPV: 8.4 fL (ref 7.5–12.5)
Platelets: 569 10*3/uL — ABNORMAL HIGH (ref 140–400)
RBC: 4.55 MIL/uL (ref 3.80–5.10)
RDW: 17.7 % — ABNORMAL HIGH (ref 11.0–15.0)
WBC: 7 10*3/uL (ref 3.8–10.8)

## 2015-10-18 MED ORDER — CLONIDINE HCL 0.2 MG PO TABS: ORAL_TABLET | ORAL | 3 refills | Status: DC

## 2015-10-18 MED ORDER — AMLODIPINE BESYLATE 10 MG PO TABS: 10.0000 mg | ORAL_TABLET | Freq: Every day | ORAL | 3 refills | Status: DC

## 2015-10-18 NOTE — Patient Instructions (Signed)
Medication Instructions:  Continue current medications  Labwork: CBC and BMP  Testing/Procedures: None Ordered  Follow-Up: You have been referred to Home Physical Therapy  Your physician wants you to follow-up in: 6 Months. You will receive a reminder letter in the mail two months in advance. If you don't receive a letter, please call our office to schedule the follow-up appointment.  Any Other Special Instructions Will Be Listed Below (If Applicable).   If you need a refill on your cardiac medications before your next appointment, please call your pharmacy.

## 2015-10-19 ENCOUNTER — Telehealth: Payer: Self-pay | Admitting: Cardiology

## 2015-10-19 ENCOUNTER — Observation Stay (HOSPITAL_COMMUNITY)
Admission: EM | Admit: 2015-10-19 | Discharge: 2015-10-20 | Disposition: A | Discharge status: Home / Self Care | Payer: MEDICARE | Attending: Internal Medicine | Admitting: Internal Medicine

## 2015-10-19 ENCOUNTER — Encounter (HOSPITAL_COMMUNITY): Payer: Self-pay | Admitting: Emergency Medicine

## 2015-10-19 ENCOUNTER — Observation Stay (HOSPITAL_COMMUNITY): Payer: MEDICARE

## 2015-10-19 ENCOUNTER — Ambulatory Visit (HOSPITAL_COMMUNITY): Payer: MEDICARE

## 2015-10-19 DIAGNOSIS — Z66 Do not resuscitate: Secondary | ICD-10-CM | POA: Diagnosis not present

## 2015-10-19 DIAGNOSIS — D649 Anemia, unspecified: Secondary | ICD-10-CM | POA: Diagnosis present

## 2015-10-19 DIAGNOSIS — D519 Vitamin B12 deficiency anemia, unspecified: Secondary | ICD-10-CM | POA: Diagnosis not present

## 2015-10-19 DIAGNOSIS — Z79899 Other long term (current) drug therapy: Secondary | ICD-10-CM | POA: Insufficient documentation

## 2015-10-19 DIAGNOSIS — R8271 Bacteriuria: Secondary | ICD-10-CM | POA: Insufficient documentation

## 2015-10-19 DIAGNOSIS — R259 Unspecified abnormal involuntary movements: Secondary | ICD-10-CM | POA: Diagnosis not present

## 2015-10-19 DIAGNOSIS — I119 Hypertensive heart disease without heart failure: Secondary | ICD-10-CM | POA: Insufficient documentation

## 2015-10-19 DIAGNOSIS — D509 Iron deficiency anemia, unspecified: Secondary | ICD-10-CM | POA: Diagnosis not present

## 2015-10-19 DIAGNOSIS — I471 Supraventricular tachycardia: Secondary | ICD-10-CM | POA: Insufficient documentation

## 2015-10-19 DIAGNOSIS — I10 Essential (primary) hypertension: Secondary | ICD-10-CM | POA: Diagnosis not present

## 2015-10-19 DIAGNOSIS — M6281 Muscle weakness (generalized): Secondary | ICD-10-CM | POA: Diagnosis not present

## 2015-10-19 DIAGNOSIS — R799 Abnormal finding of blood chemistry, unspecified: Secondary | ICD-10-CM | POA: Diagnosis present

## 2015-10-19 LAB — COMPREHENSIVE METABOLIC PANEL
ALT: 13 U/L — ABNORMAL LOW (ref 14–54)
ANION GAP: 9 (ref 5–15)
AST: 15 U/L (ref 15–41)
Albumin: 3.9 g/dL (ref 3.5–5.0)
Alkaline Phosphatase: 99 U/L (ref 38–126)
BUN: 13 mg/dL (ref 6–20)
CHLORIDE: 107 mmol/L (ref 101–111)
CO2: 22 mmol/L (ref 22–32)
Calcium: 8.9 mg/dL (ref 8.9–10.3)
Creatinine, Ser: 0.74 mg/dL (ref 0.44–1.00)
GFR calc non Af Amer: >60 mL/min (ref >60)
Glucose, Bld: 113 mg/dL — ABNORMAL HIGH (ref 65–99)
POTASSIUM: 4.2 mmol/L (ref 3.5–5.1)
Sodium: 138 mmol/L (ref 135–145)
TOTAL PROTEIN: 7.7 g/dL (ref 6.5–8.1)
Total Bilirubin: 0.5 mg/dL (ref 0.3–1.2)

## 2015-10-19 LAB — CBC WITH DIFFERENTIAL/PLATELET
Basophils Absolute: 0.1 10*3/uL (ref 0.0–0.1)
Basophils Relative: 1 %
Eosinophils Absolute: 0.1 10*3/uL (ref 0.0–0.7)
Eosinophils Relative: 2 %
HCT: 26.9 % — ABNORMAL LOW (ref 36.0–46.0)
Hemoglobin: 7.5 g/dL — ABNORMAL LOW (ref 12.0–15.0)
Lymphocytes Relative: 20 %
Lymphs Abs: 1.2 10*3/uL (ref 0.7–4.0)
MCH: 17.2 pg — AB (ref 26.0–34.0)
MCHC: 27.9 g/dL — AB (ref 30.0–36.0)
MCV: 61.8 fL — ABNORMAL LOW (ref 78.0–100.0)
Monocytes Absolute: 0.7 10*3/uL (ref 0.1–1.0)
Monocytes Relative: 11 %
Neutro Abs: 4.1 10*3/uL (ref 1.7–7.7)
Neutrophils Relative %: 66 %
PLATELETS: 539 10*3/uL — AB (ref 150–400)
RBC: 4.35 MIL/uL (ref 3.87–5.11)
RDW: 18 % — ABNORMAL HIGH (ref 11.5–15.5)
WBC: 6.2 10*3/uL (ref 4.0–10.5)

## 2015-10-19 LAB — BASIC METABOLIC PANEL
BUN: 13 mg/dL (ref 7–25)
CALCIUM: 9.1 mg/dL (ref 8.6–10.4)
CO2: 20 mmol/L (ref 20–31)
CREATININE: 0.76 mg/dL (ref 0.60–0.88)
Chloride: 105 mmol/L (ref 98–110)
GLUCOSE: 100 mg/dL — AB (ref 65–99)
Potassium: 4.6 mmol/L (ref 3.5–5.3)
SODIUM: 138 mmol/L (ref 135–146)

## 2015-10-19 LAB — IRON AND TIBC
IRON: 12 ug/dL — AB (ref 28–170)
Saturation Ratios: 2 % — ABNORMAL LOW (ref 10.4–31.8)
TIBC: 584 ug/dL — AB (ref 250–450)
UIBC: 572 ug/dL

## 2015-10-19 LAB — URINALYSIS, ROUTINE W REFLEX MICROSCOPIC
Bilirubin Urine: NEGATIVE
Glucose, UA: NEGATIVE mg/dL
Hgb urine dipstick: NEGATIVE
Ketones, ur: NEGATIVE mg/dL
NITRITE: NEGATIVE
PROTEIN: NEGATIVE mg/dL
Specific Gravity, Urine: 1.01 (ref 1.005–1.030)
pH: 6.5 (ref 5.0–8.0)

## 2015-10-19 LAB — VITAMIN B12: VITAMIN B 12: 257 pg/mL (ref 180–914)

## 2015-10-19 LAB — PREPARE RBC (CROSSMATCH)

## 2015-10-19 LAB — FERRITIN: Ferritin: 3 ng/mL — ABNORMAL LOW (ref 11–307)

## 2015-10-19 LAB — URINE MICROSCOPIC-ADD ON

## 2015-10-19 LAB — FOLATE: Folate: 11.7 ng/mL (ref >5.9)

## 2015-10-19 LAB — ABO/RH: ABO/RH(D): O POS

## 2015-10-19 LAB — RETICULOCYTES
RBC.: 4.4 MIL/uL (ref 3.87–5.11)
RETIC CT PCT: 1.4 % (ref 0.4–3.1)
Retic Count, Absolute: 61.6 10*3/uL (ref 19.0–186.0)

## 2015-10-19 LAB — POC OCCULT BLOOD, ED: FECAL OCCULT BLD: NEGATIVE

## 2015-10-19 MED ORDER — CLONIDINE HCL 0.1 MG PO TABS
0.2000 mg | ORAL_TABLET | Freq: Every day | ORAL | Status: DC
  Administered 2015-10-20: 0.2 mg via ORAL
  Filled 2015-10-19: qty 2

## 2015-10-19 MED ORDER — NITROFURANTOIN MONOHYD MACRO 100 MG PO CAPS
100.0000 mg | ORAL_CAPSULE | Freq: Two times a day (BID) | ORAL | Status: DC
  Administered 2015-10-19 – 2015-10-20 (×2): 100 mg via ORAL
  Filled 2015-10-19 (×2): qty 1

## 2015-10-19 MED ORDER — CLONIDINE HCL 0.1 MG PO TABS
0.3000 mg | ORAL_TABLET | Freq: Every day | ORAL | Status: DC
  Administered 2015-10-19: 0.3 mg via ORAL
  Filled 2015-10-19: qty 3

## 2015-10-19 MED ORDER — VITAMINS A & D EX OINT
TOPICAL_OINTMENT | CUTANEOUS | Status: AC
  Administered 2015-10-19: 5
  Filled 2015-10-19: qty 5

## 2015-10-19 MED ORDER — POLYETHYLENE GLYCOL 3350 17 G PO PACK
17.0000 g | PACK | Freq: Every day | ORAL | Status: DC
Start: 2015-10-19 — End: 2015-10-20
  Administered 2015-10-19 – 2015-10-20 (×2): 17 g via ORAL
  Filled 2015-10-19 (×2): qty 1

## 2015-10-19 MED ORDER — METOPROLOL TARTRATE 12.5 MG HALF TABLET
12.5000 mg | ORAL_TABLET | Freq: Two times a day (BID) | ORAL | Status: DC
  Administered 2015-10-19: 12.5 mg via ORAL
  Filled 2015-10-19 (×2): qty 1

## 2015-10-19 MED ORDER — AMLODIPINE BESYLATE 10 MG PO TABS
10.0000 mg | ORAL_TABLET | Freq: Every day | ORAL | Status: DC
  Administered 2015-10-20: 10 mg via ORAL
  Filled 2015-10-19: qty 1

## 2015-10-19 MED ORDER — CLONIDINE HCL 0.1 MG PO TABS: 0.2000 mg | ORAL_TABLET | Freq: Two times a day (BID) | ORAL | Status: DC

## 2015-10-19 MED ORDER — SODIUM CHLORIDE 0.9 % IV SOLN: Freq: Once | INTRAVENOUS | Status: DC

## 2015-10-19 MED ORDER — SENNOSIDES-DOCUSATE SODIUM 8.6-50 MG PO TABS: 1.0000 | ORAL_TABLET | Freq: Every evening | ORAL | Status: DC | PRN

## 2015-10-19 MED ORDER — SPIRONOLACTONE 25 MG PO TABS
50.0000 mg | ORAL_TABLET | Freq: Every day | ORAL | Status: DC
  Administered 2015-10-20: 50 mg via ORAL
  Filled 2015-10-19: qty 2

## 2015-10-19 NOTE — Telephone Encounter (Signed)
Please call asap,pt is in the ER.She said she had talked to you earlier today.

## 2015-10-19 NOTE — H&P (Signed)
History and Physical    BYRL BURKET [id number] DOB: 1927/07/27 DOA: 10/19/2015  PCP: Harle Battiest, MD  Outpatient Specialists: cardiology, Dr. Percival Spanish Patient coming from: home  Chief Complaint: Weakness  HPI: [name] [name] is a 82 y.o. female with medical history significant of hypertension, atrial tachycardia, tremors, presents to the emergency room with chief complaint of weakness. Patient has been feeling progressively weak over the last several months, however in the last few days she got so weak that she was barely able to walk without having to stop to catch her breath. She saw her cardiologist yesterday, and had blood work done which showed a hemoglobin of 7.9 from prior normal values. Most recent hemoglobin in our system is in June 2016 and it was 13 at that time. She came to the emergency room for evaluation. Patient denies any fever or chills. She denies any blood in her stool or dark tarry stools. She has hemorrhoids and rarely when she is constipated she sees streaks of bright red blood however has not seen any recently. She denies any weight loss. She has had no fever or chills. She denies any lightheadedness or dizziness. She has no chest pain or palpitations.  ED Course: In the emergency room, patient underwent repeat CBC which showed a hemoglobin of 7.5. She underwent fecal occult testing which was found to be negative. TRH was asked for admission for symptomatically anemia and further workup.  Review of Systems: As per HPI otherwise 10 point review of systems negative.   Past Medical History:  Diagnosis Date  . Atrial tachycardia ([place])   . HTN (hypertension)   . Jaundice due to hepatitis    at age 36 or 5  . Transient global amnesia    AMS  . Tremors of nervous system    benign familial    Past Surgical History:  Procedure Laterality Date  . ABDOMINAL HERNIA REPAIR    . BACK SURGERY     years ago  . TONSILLECTOMY       reports that she has never smoked.  She has never used smokeless tobacco. She reports that she does not drink alcohol. Her drug history is not on file.  Allergies  Allergen Reactions  . Aspirin Hives  . Codeine Other (See Comments)    REACTION: GI upset  . Hydrochlorothiazide Rash  . Penicillins Rash  . Sulfonamide Derivatives Rash  . Levofloxacin Other (See Comments)    Joint aches and weakness    Family History  Problem Relation Age of Onset  . Lung cancer Mother   . Parkinson's disease Mother   . Hypertension Mother   . Other Father     blood clot  . Arthritis Sister   . Other Sister     spinal stenosis    Prior to Admission medications   Medication Sig Start Date End Date Taking? Authorizing Provider  amLODipine (NORVASC) 10 MG tablet Take 1 tablet (10 mg total) by mouth daily. 10/18/15  Yes Minus Breeding, MD  cloNIDine (CATAPRES) 0.2 MG tablet Pt takes 1 tablet in the morning and 1.5 mg at bedtime 10/18/15  Yes Minus Breeding, MD  metoprolol tartrate (LOPRESSOR) 25 MG tablet Take 0.5 tablets (12.5 mg total) by mouth 2 (two) times daily. 08/08/15  Yes Minus Breeding, MD  spironolactone (ALDACTONE) 50 MG tablet Take 1 tablet (50 mg total) by mouth daily. 08/08/15  Yes Minus Breeding, MD    Physical Exam: Vitals:   10/19/15 1218 10/19/15 1533  BP: 135/61  134/55  Pulse: 65 73  Resp: 18 19  Temp: 97.9 F (36.6 C)   TempSrc: Oral   SpO2: 97% 97%      Constitutional: NAD, calm, comfortable Vitals:   10/19/15 1218 10/19/15 1533  BP: 135/61 134/55  Pulse: 65 73  Resp: 18 19  Temp: 97.9 F (36.6 C)   TempSrc: Oral   SpO2: 97% 97%   Eyes: PERRL ENMT: Mucous membranes are moist. Posterior pharynx clear of any exudate or lesions.  Neck: normal, supple, no masses, no thyromegaly Respiratory: clear to auscultation bilaterally, no wheezing, no crackles. Normal respiratory effort. Cardiovascular: Regular rate and rhythm, no murmurs / rubs / gallops. Trace extremity edema. 2+ pedal pulses.  Abdomen: no  tenderness, no masses palpated. Bowel sounds positive.  Musculoskeletal: no clubbing / cyanosis. Normal muscle tone. Right lower extremity swollen more than left. Skin: no rashes, lesions, ulcers. No induration Neurologic: CN 2-12 grossly intact. Strength 5/5 in all 4.  Psychiatric: Normal judgment and insight. Alert and oriented x 3. Normal mood.   Labs on Admission: I have personally reviewed following labs and imaging studies  CBC:  Recent Labs Lab 10/18/15 1502 10/19/15 1319  WBC 7.0 6.2  NEUTROABS  --  4.1  HGB 7.9* 7.5*  HCT 28.9* 26.9*  MCV 62.9* 61.8*  PLT 569* [id number]   Basic Metabolic Panel:  Recent Labs Lab 10/18/15 1502 10/19/15 1319  NA 138 138  K 4.6 4.2  CL 105 107  CO2 20 22  GLUCOSE 100* 113*  BUN 13 13  CREATININE 0.76 0.74  CALCIUM 9.1 8.9   GFR: Estimated Creatinine Clearance: 50.3 mL/min (by C-G formula based on SCr of 0.8 mg/dL). Liver Function Tests:  Recent Labs Lab 10/19/15 1319  AST 15  ALT 13*  ALKPHOS 99  BILITOT 0.5  PROT 7.7  ALBUMIN 3.9   No results for input(s): LIPASE, AMYLASE in the last 168 hours. No results for input(s): AMMONIA in the last 168 hours. Coagulation Profile: No results for input(s): INR, PROTIME in the last 168 hours. Cardiac Enzymes: No results for input(s): CKTOTAL, CKMB, CKMBINDEX, TROPONINI in the last 168 hours. BNP (last 3 results) No results for input(s): PROBNP in the last 8760 hours. HbA1C: No results for input(s): HGBA1C in the last 72 hours. CBG: No results for input(s): GLUCAP in the last 168 hours. Lipid Profile: No results for input(s): CHOL, HDL, LDLCALC, TRIG, CHOLHDL, LDLDIRECT in the last 72 hours. Thyroid Function Tests: No results for input(s): TSH, T4TOTAL, FREET4, T3FREE, THYROIDAB in the last 72 hours. Anemia Panel:  Recent Labs  10/19/15 1432  RETICCTPCT 1.4   Urine analysis:    Component Value Date/Time   COLORURINE YELLOW 10/19/2015 1317   APPEARANCEUR CLOUDY (A)  10/19/2015 1317   LABSPEC 1.010 10/19/2015 1317   PHURINE 6.5 10/19/2015 1317   GLUCOSEU NEGATIVE 10/19/2015 1317   HGBUR NEGATIVE 10/19/2015 1317   BILIRUBINUR NEGATIVE 10/19/2015 1317   KETONESUR NEGATIVE 10/19/2015 1317   PROTEINUR NEGATIVE 10/19/2015 1317   UROBILINOGEN 0.2 07/25/2014 1505   NITRITE NEGATIVE 10/19/2015 1317   LEUKOCYTESUR LARGE (A) 10/19/2015 1317    Radiological Exams on Admission: No results found.  EKG: Independently reviewed. Sinus rhythm  Assessment/Plan Active Problems:   Essential hypertension, benign   Abnormal involuntary movement   Anemia   Symptomatic anemia    Symptomatic anemia - Unclear etiology, her hemoglobin last year was normal and now it 7.5. She is no gross GI bleeding and fecal occult was negative.  We'll repeat FOBT 3, however may benefit from GI evaluation regardless in the morning. - Transfuse 1 unit of packed red blood cell - Obtain anemia panel, haptoglobin, obtain peripheral smear  Essential Hypertension - resume home medications  Tremor  UTI - Patient with dirty UA and reports occasional symptoms with frequency, will treat with Macrobid (multiple medication allergies), cultures sent   DVT prophylaxis: SCD  Code Status: DNR  Family Communication: d/w husband and daughter bedside Disposition Plan: admit tot medsurg Consults called: none  Admission status: Observation   Marzetta Board, MD [facility] Pager [phone]  If 7PM-7AM, please contact night-coverage [email or web address] Password [email or web address]  10/19/2015, 4:09 PM

## 2015-10-19 NOTE — Telephone Encounter (Signed)
New message       Daughter looked at [facility] and saw pts most recent labs.  Her hgb was 7.  Pt has been complaining of fatigue.  Daughter want to talk to someone as soon as possible.

## 2015-10-19 NOTE — Telephone Encounter (Signed)
Have spoken with caller, notes Dr. Harrington Challenger' office sent them to ER - she spent several minutes explaining to me the details of why "the past 48 hours have been hell" -- very upset with the way the labwork notification was handled and the outcome of the recent evaluation. Asked me why this was not addressed until they called this morning. I apologized and noted I was not in the loop on the labwork notification and that this was sent by Dr. Percival Spanish to his MA for follow up phone call. (Noted the results were released before phone notification was given, and again reiterated to caller that Dr. Percival Spanish informed me this morning that his recommendation was for patient to follow up with PCP). Daughter informs me of pending admission, and that CBC results just came back indicating patient's Hgb is further reduced from yesterday. Had numerous concerns which I informed her I was unable to address. She requested to speak to [facility], Environmental education officer, I have informed Amy of caller's concerns and she has spoken with caller via telephone and will address as able.

## 2015-10-19 NOTE — ED Notes (Signed)
PA at bedside.

## 2015-10-19 NOTE — ED Provider Notes (Signed)
[facility] DEPT Provider Note   CSN: [id number] Arrival date & time: 10/19/15  1207     History   Chief Complaint Chief Complaint  Patient presents with  . abnormal labs    HPI [name] [name] is a 82 y.o. female.  HPI [name] FRIEDERICHS is a 82 y.o. female with PMH significant for HTN who presents with abnormal lab.  Patient was at cardiologist yesterday, and hgb found to be 7.9 so she was sent here for further evaluation.  She states over the last year she has been experiencing gradually worsening, constant, unchanging fatigue, generalized weakness, and dyspnea with exertion.  No CP, cough, fever, abdominal pain, melena, hematochezia, hematemesis, or urinary symptoms.  No modifying or aggravating factors.   Past Medical History:  Diagnosis Date  . Atrial tachycardia ([place])   . HTN (hypertension)   . Jaundice due to hepatitis    at age 78 or 56  . Transient global amnesia    AMS  . Tremors of nervous system    benign familial    Patient Active Problem List   Diagnosis Date Noted  . Anemia 10/19/2015  . UTI (urinary tract infection) 07/26/2014  . TGA (transient global amnesia) 07/25/2014  . Cardiac dysrhythmia 02/06/2010  . PALPITATIONS 02/06/2010  . CHICKENPOX, HX OF 09/07/2008  . Essential hypertension, benign 05/22/2008  . PSVT 03/27/2008  . OBESITY, UNSPECIFIED 03/26/2008  . TREMOR 03/26/2008  . AMNESIA, TRANSIENT GLOBAL 01/10/2007    Past Surgical History:  Procedure Laterality Date  . ABDOMINAL HERNIA REPAIR    . BACK SURGERY     years ago  . TONSILLECTOMY      OB History    No data available       Home Medications    Prior to Admission medications   Medication Sig Start Date End Date Taking? Authorizing Provider  amLODipine (NORVASC) 10 MG tablet Take 1 tablet (10 mg total) by mouth daily. 10/18/15  Yes Minus Breeding, MD  cloNIDine (CATAPRES) 0.2 MG tablet Pt takes 1 tablet in the morning and 1.5 mg at bedtime 10/18/15  Yes Minus Breeding, MD    metoprolol tartrate (LOPRESSOR) 25 MG tablet Take 0.5 tablets (12.5 mg total) by mouth 2 (two) times daily. 08/08/15  Yes Minus Breeding, MD  spironolactone (ALDACTONE) 50 MG tablet Take 1 tablet (50 mg total) by mouth daily. 08/08/15  Yes Minus Breeding, MD    Family History Family History  Problem Relation Age of Onset  . Lung cancer Mother   . Parkinson's disease Mother   . Hypertension Mother   . Other Father     blood clot  . Arthritis Sister   . Other Sister     spinal stenosis    Social History Social History  Substance Use Topics  . Smoking status: Never Smoker  . Smokeless tobacco: Never Used     Comment: + prior 2nd hand exposure from spouse smoking  . Alcohol use No     Allergies   Aspirin; Codeine; Hydrochlorothiazide; Penicillins; Sulfonamide derivatives; and Levofloxacin   Review of Systems Review of Systems All other systems negative unless otherwise stated in HPI   Physical Exam Updated Vital Signs BP 135/61 (BP Location: Left Arm)   Pulse 65   Temp 97.9 F (36.6 C) (Oral)   Resp 18   SpO2 97%   Physical Exam  Constitutional: She is oriented to person, place, and time. She appears well-developed and well-nourished.  Non-toxic appearance. She does not have a  sickly appearance. She does not appear ill.  HENT:  Head: Normocephalic and atraumatic.  Mouth/Throat: Oropharynx is clear and moist.  Eyes: Conjunctivae are normal. Pupils are equal, round, and reactive to light.  Neck: Normal range of motion. Neck supple.  Cardiovascular: Normal rate and regular rhythm.   Pulmonary/Chest: Effort normal and breath sounds normal. No accessory muscle usage or stridor. No respiratory distress. She has no wheezes. She has no rhonchi. She has no rales.  Abdominal: Soft. Bowel sounds are normal. She exhibits no distension. There is no tenderness.  Musculoskeletal: Normal range of motion.  Lymphadenopathy:    She has no cervical adenopathy.  Neurological: She is  alert and oriented to person, place, and time.  Speech clear without dysarthria.  Skin: Skin is warm and dry. There is pallor.  Psychiatric: She has a normal mood and affect. Her behavior is normal.     ED Treatments / Results  Labs (all labs ordered are listed, but only abnormal results are displayed) Labs Reviewed  CBC WITH DIFFERENTIAL/PLATELET - Abnormal; Notable for the following:       Result Value   Hemoglobin 7.5 (*)    HCT 26.9 (*)    MCV 61.8 (*)    MCH 17.2 (*)    MCHC 27.9 (*)    RDW 18.0 (*)    Platelets 539 (*)    All other components within normal limits  COMPREHENSIVE METABOLIC PANEL - Abnormal; Notable for the following:    Glucose, Bld 113 (*)    ALT 13 (*)    All other components within normal limits  URINALYSIS, ROUTINE W REFLEX MICROSCOPIC (NOT AT [facility]) - Abnormal; Notable for the following:    APPearance CLOUDY (*)    Leukocytes, UA LARGE (*)    All other components within normal limits  URINE MICROSCOPIC-ADD ON - Abnormal; Notable for the following:    Squamous Epithelial / LPF 0-5 (*)    Bacteria, UA MANY (*)    All other components within normal limits  RETICULOCYTES  VITAMIN B12  FOLATE  IRON AND TIBC  FERRITIN  POC OCCULT BLOOD, ED    EKG  EKG Interpretation  Date/Time:  Friday October 19 2015 13:44:39 EDT Ventricular Rate:  61 PR Interval:    QRS Duration: 100 QT Interval:  430 QTC Calculation: 434 R Axis:   75 Text Interpretation:  Sinus rhythm Low voltage, precordial leads Minimal ST depression No significant change since last tracing Confirmed by KNAPP  MD-J, JON [id number]) on 10/19/2015 2:36:33 PM       Radiology No results found.  Procedures Procedures (including critical care time)  Medications Ordered in ED Medications - No data to display   Initial Impression / Assessment and Plan / ED Course  I have reviewed the triage vital signs and the nursing notes.  Pertinent labs & imaging results that were available during  my care of the patient were reviewed by me and considered in my medical decision making (see chart for details).  Clinical Course  Comment By Time  Seen and examined Dorie Rank, MD 09/08 1446   Patient presents with 1 year of gradually worsening fatigue and generalized weakness.  Hemodynamically stable.  Associated symptoms include SOB.  Will obtain EKG and labs as well as anemia panel.   Hgb decreased from yesterday, now 7.5.  Hemodynamically stable; however, she is symptomatic with DOE, fatigue, and pallor.  Guaiac negative, no acute bloodloss.  I feel the patient would benefit for admission for  symptomatic anemia and further evaluation.   Final Clinical Impressions(s) / ED Diagnoses   Final diagnoses:  Anemia, unspecified anemia type    New Prescriptions New Prescriptions   No medications on file     Gloriann Loan, PA-C 10/19/15 1529    Dorie Rank, MD 10/21/15 [phone]

## 2015-10-19 NOTE — ED Triage Notes (Signed)
Pt sent from Dr. Harrington Challenger PCP, after having blood work yesterday, for hemoglobin 7.9. Per pt family she has been experiencing progressively worsening weakness and fatigue x1 year. Pt has no complaints.

## 2015-10-19 NOTE — ED Notes (Signed)
Hospitalist at bedside 

## 2015-10-19 NOTE — Telephone Encounter (Signed)
Spoke to daughter, listed DPR. Reviewed labwork. She states concern over the 7.9 hgb and wanted to get recommendations - thought patient might need to be admitted to hospital. Discussed w/ Dr. Percival Spanish - stated OK to follow up w Dr. Harrington Challenger, pt stable when seen yesterday. Relayed recommendations - daughter still worried about what to do. Advised if any rapid changes to go to ER, o/w follow up w Dr. Harrington Challenger or one of his associates next available. Caller voiced understanding of instructions.

## 2015-10-20 ENCOUNTER — Observation Stay (HOSPITAL_BASED_OUTPATIENT_CLINIC_OR_DEPARTMENT_OTHER): Payer: MEDICARE

## 2015-10-20 DIAGNOSIS — D649 Anemia, unspecified: Secondary | ICD-10-CM | POA: Diagnosis not present

## 2015-10-20 DIAGNOSIS — D509 Iron deficiency anemia, unspecified: Secondary | ICD-10-CM

## 2015-10-20 DIAGNOSIS — I10 Essential (primary) hypertension: Secondary | ICD-10-CM | POA: Diagnosis not present

## 2015-10-20 DIAGNOSIS — D519 Vitamin B12 deficiency anemia, unspecified: Secondary | ICD-10-CM

## 2015-10-20 LAB — CBC
HEMATOCRIT: 29.9 % — AB (ref 36.0–46.0)
Hemoglobin: 8.6 g/dL — ABNORMAL LOW (ref 12.0–15.0)
MCH: 18.9 pg — AB (ref 26.0–34.0)
MCHC: 28.8 g/dL — ABNORMAL LOW (ref 30.0–36.0)
MCV: 65.6 fL — AB (ref 78.0–100.0)
Platelets: 488 10*3/uL — ABNORMAL HIGH (ref 150–400)
RBC: 4.56 MIL/uL (ref 3.87–5.11)
RDW: 19.8 % — ABNORMAL HIGH (ref 11.5–15.5)
WBC: 6.2 10*3/uL (ref 4.0–10.5)

## 2015-10-20 LAB — HAPTOGLOBIN: HAPTOGLOBIN: 170 mg/dL (ref 34–200)

## 2015-10-20 LAB — BASIC METABOLIC PANEL
ANION GAP: 7 (ref 5–15)
BUN: 13 mg/dL (ref 6–20)
CHLORIDE: 108 mmol/L (ref 101–111)
CO2: 24 mmol/L (ref 22–32)
Calcium: 8.7 mg/dL — ABNORMAL LOW (ref 8.9–10.3)
Creatinine, Ser: 0.72 mg/dL (ref 0.44–1.00)
GFR calc Af Amer: >60 mL/min (ref >60)
GFR calc non Af Amer: >60 mL/min (ref >60)
GLUCOSE: 103 mg/dL — AB (ref 65–99)
POTASSIUM: 4 mmol/L (ref 3.5–5.1)
Sodium: 139 mmol/L (ref 135–145)

## 2015-10-20 LAB — SAVE SMEAR

## 2015-10-20 MED ORDER — POLYETHYLENE GLYCOL 3350 17 G PO PACK: 17.0000 g | PACK | Freq: Every day | ORAL | 0 refills | Status: DC | PRN

## 2015-10-20 MED ORDER — FERROUS SULFATE 325 (65 FE) MG PO TABS: 325.0000 mg | ORAL_TABLET | Freq: Two times a day (BID) | ORAL | 0 refills | Status: DC

## 2015-10-20 MED ORDER — CYANOCOBALAMIN 1000 MCG/ML IJ SOLN
1000.0000 ug | Freq: Once | INTRAMUSCULAR | Status: AC
  Administered 2015-10-20: 1000 ug via INTRAMUSCULAR
  Filled 2015-10-20: qty 1

## 2015-10-20 NOTE — Progress Notes (Signed)
Discharge instructions reviewed with patient and family. They verbalize understanding. Patient ready for discharge.

## 2015-10-20 NOTE — Telephone Encounter (Signed)
I had a very long conversation with the patient's daughter.  I told her that I looked at her labs first thing yesterday morning.  They had been resulted at 11:30 the night before.  I sent the response to the staff in basket to be handled routinely.  This would routinely be handled within that working day.  I did not think that this was evidence of an acute blood loss anemia.  The patient was having no acute symptoms when I saw her.  She had no symptoms suggestion of active GI bleeding. She was on no high risk meds for bleeding.  I explained that I suspect that she has likely developed a chronic anemia that needs to be evaluated.  I would suggest a primary care appt in the next few days.  (Early next week would have been acceptable.)  I drew the labs only as a matter of completeness as she had not had a CBC in many months and I was going to be checking a BMET as I have her on spironolactone.  The patient was sent to the ED because she could not be seen in the office.  I have reviewed the hospital records.  In fact occult stool was negative and the admitting physician suspected a chronic anemia.  She is being discharged for an elective out patient evaluation after an overnight hospitalization.   I explained to the daughter that had I thought that this was an urgent result requiring immediate hospital attention I would have sent the result to our triage nurse to be handled immediately and the patient would have been notified immediately of this plan.

## 2015-10-20 NOTE — Progress Notes (Signed)
*  Preliminary Results* Bilateral lower extremity venous duplex completed. Bilateral lower extremities are negative for deep vein thrombosis. There is no evidence of Baker's cyst bilaterally.  10/20/2015 10:50 AM Maudry Mayhew, BS, RVT, RDCS, RDMS

## 2015-10-20 NOTE — Telephone Encounter (Incomplete)
I had a very long conversation with the patient's daughter.  I told her that I looked at her labs first thing yesterday morning.  They had been resulted at 11:30 the night before.  I sent the response to the staff in basket to be handled routinely.  This would routinely be handled within that working day.  I did not think that this was urgent.  The patient was having no acute symptoms when I saw her.  She had no acute complaints.  She had no suggestion of active GI bleeding. She was on no high risk meds for bleeding.  I explained that I suspect that she has likely developed a chronic anemia that needs to be evaluated.  I would suggest a primary care appt in the next few days.  (Early next week would have been acceptable.)  I drew the labs only as a matter of completeness as she had not had a CBC in many months and I was going to

## 2015-10-20 NOTE — Discharge Instructions (Signed)
Anemia, Nonspecific Anemia is a condition in which the concentration of red blood cells or hemoglobin in the blood is below normal. Hemoglobin is a substance in red blood cells that carries oxygen to the tissues of the body. Anemia results in not enough oxygen reaching these tissues.  CAUSES  Common causes of anemia include:   Excessive bleeding. Bleeding may be internal or external. This includes excessive bleeding from periods (in women) or from the intestine.   Poor nutrition.   Chronic kidney, thyroid, and liver disease.  Bone marrow disorders that decrease red blood cell production.  Cancer and treatments for cancer.  HIV, AIDS, and their treatments.  Spleen problems that increase red blood cell destruction.  Blood disorders.  Excess destruction of red blood cells due to infection, medicines, and autoimmune disorders. SIGNS AND SYMPTOMS   Minor weakness.   Dizziness.   Headache.  Palpitations.   Shortness of breath, especially with exercise.   Paleness.  Cold sensitivity.  Indigestion.  Nausea.  Difficulty sleeping.  Difficulty concentrating. Symptoms may occur suddenly or they may develop slowly.  DIAGNOSIS  Additional blood tests are often needed. These help your health care provider determine the best treatment. Your health care provider will check your stool for blood and look for other causes of blood loss.  TREATMENT  Treatment varies depending on the cause of the anemia. Treatment can include:   Supplements of iron, vitamin B12, or folic acid.   Hormone medicines.   A blood transfusion. This may be needed if blood loss is severe.   Hospitalization. This may be needed if there is significant continual blood loss.   Dietary changes.  Spleen removal. HOME CARE INSTRUCTIONS Keep all follow-up appointments. It often takes many weeks to correct anemia, and having your health care provider check on your condition and your response to  treatment is very important. SEEK IMMEDIATE MEDICAL CARE IF:   You develop extreme weakness, shortness of breath, or chest pain.   You become dizzy or have trouble concentrating.  You develop heavy vaginal bleeding.   You develop a rash.   You have bloody or black, tarry stools.   You faint.   You vomit up blood.   You vomit repeatedly.   You have abdominal pain.  You have a fever or persistent symptoms for more than 2-3 days.   You have a fever and your symptoms suddenly get worse.   You are dehydrated.  MAKE SURE YOU:  Understand these instructions.  Will watch your condition.  Will get help right away if you are not doing well or get worse.   This information is not intended to replace advice given to you by your health care provider. Make sure you discuss any questions you have with your health care provider.   Document Released: 03/06/2004 Document Revised: 09/29/2012 Document Reviewed: 07/23/2012 Elsevier Interactive Patient Education 2016 [employer]  

## 2015-10-20 NOTE — Progress Notes (Signed)
Patient walked with stand-by assistance to nurses' station in hallway and back to room. Patient was steady and Oxygen saturation remained at 99%.

## 2015-10-20 NOTE — Discharge Summary (Signed)
Physician Discharge Summary  [name] [name] [id number] DOB: 07-04-1927 DOA: 10/19/2015  PCP: Harle Battiest, MD  Admit date: 10/19/2015 Discharge date: 10/20/2015  Admitted From: Home Disposition:  Home  Recommendations for Outpatient Follow-up:  1. Follow up with PCP in 1-2 weeks.Please monitor H&H during outpatient follow-up. 2. Patient is started on monthly vitamin B12 injections (received first dose today, next dose on 10/9) 3. Patient should follow-up with GI Dr. Fuller Plan in 3-4 weeks.  Home Health:Home Equipment/Devices: Encouraged to use cane for safety and relation  Discharge Condition: Fair CODE STATUS: DO NOT RESUSCITATE Diet recommendation: Regular    Discharge Diagnoses:  Principal problem Symptomatic anemia  Active Problems:   Essential hypertension, benign   Abnormal involuntary movement   Iron deficiency anemia   B12 deficiency anemia History of atrial tachycardia   Brief narrative   82 year old female with history of hypertension, atrial tachycardia, benign familial tremors presented with fatigue and weakness for past several months. This has gotten worse in the past few days and patient having dyspnea on exertion. She saw her cardiologist and had blood will done showed significant drop in hemoglobin to 7.9 from 13 about a year ago. Patient denies any fevers, chills, weight loss, hematemesis, hemoptysis, melena or bright red blood per rectum. Denies chest pain, palpitations, abdominal pain, tingling or numbness of her extremities. Denies confusion or near syncope. Denies use of NSAIDs are being on aspirin.  In the ED vitals were stable. Blood work showed hemoglobin of 7.5 with low MCV. Chemistry was unremarkable. Patient placed on observation and ordered 1 unit PRBC transfusion.  Hospital course Symptomatic anemia Likely contributed by severe iron deficiency and low B12. Stool for Hemoccult in the ED was negative. Suspect patient anemic for past several months  with ongoing symptoms. Hemoglobin improved 8.6 after 1 unit PRBC. Able to ambulate in the hallway without further shortness of breath or fatigue. Haptoglobin and reticulocyte score normal. Peripheral smear sent on admission.  Patient refuses inpatient GI evaluation and does not want to undergo EGD or colonoscopy at this time. Daughter informs that she was seen by Dr. Fuller Plan in the past and would like her to be seen either by Dr. Fuller Plan and Dr. Hilarie Fredrickson in the office. I sent a note to Dr. Fuller Plan for outpatient follow-up. Instructed patient to call the GI office early next week to schedule appointment. Patient instructed to return to the ED if she has worsened dyspnea, chest pain, dizziness, near syncope.   iron deficiency anemia Will start her on iron supplement. Added stool softeners for constipation.  Admitting B12 deficiency Patient wished for monthly B12 injections. Ordered 1 dose of IM B12 injection prior to discharge. Next dose on 10/9.  Essential hypertension Resume home medications  Symptomatic bacteriuria  Will not treat.  Family communication: Discussed with husband and daughters at bedside   Discharge Instructions     Medication List    TAKE these medications   amLODipine 10 MG tablet Commonly known as:  NORVASC Take 1 tablet (10 mg total) by mouth daily.   cloNIDine 0.2 MG tablet Commonly known as:  CATAPRES Pt takes 1 tablet in the morning and 1.5 mg at bedtime   ferrous sulfate 325 (65 FE) MG tablet Take 1 tablet (325 mg total) by mouth 2 (two) times daily with a meal.   metoprolol tartrate 25 MG tablet Commonly known as:  LOPRESSOR Take 0.5 tablets (12.5 mg total) by mouth 2 (two) times daily.   polyethylene glycol packet Commonly known as:  MIRALAX / GLYCOLAX Take 17 g by mouth daily as needed for mild constipation.   spironolactone 50 MG tablet Commonly known as:  ALDACTONE Take 1 tablet (50 mg total) by mouth daily.      Follow-up Information     ROSS,ALLeN, MD. Schedule an appointment as soon as possible for a visit in 1 week(s).   Specialty:  Obstetrics and Gynecology Contact information: [address] [place] [address] [phone]        Pricilla Riffle. Fuller Plan, MD. Schedule an appointment as soon as possible for a visit in 2 week(s).   Specialty:  Gastroenterology Contact information: [address] [address] [phone]          Allergies  Allergen Reactions  . Aspirin Hives  . Codeine Other (See Comments)    REACTION: GI upset  . Hydrochlorothiazide Rash  . Penicillins Rash  . Sulfonamide Derivatives Rash  . Levofloxacin Other (See Comments)    Joint aches and weakness      Procedures/Studies: Dg Chest 2 View  Result Date: 10/19/2015 CLINICAL DATA:  Anemia EXAM: CHEST  2 VIEW COMPARISON:  None. FINDINGS: Cardiomegaly. Moderate-sized hiatal hernia. No confluent airspace opacities or effusions. No edema. Mild hyperinflation. No acute bony abnormality. IMPRESSION: Moderate-sized hiatal hernia. Cardiomegaly, mild hyperinflation. No active disease. Electronically Signed   By: Rolm Baptise M.D.   On: 10/19/2015 16:29       Subjective: Feels better after transfusion.   Discharge Exam: Vitals:   10/19/15 2244 10/20/15 0653  BP: (!) 113/51 134/68  Pulse: (!) 58 (!) 49  Resp: 18 18  Temp: 98.2 F (36.8 C) 98.2 F (36.8 C)   Vitals:   10/19/15 1957 10/19/15 2034 10/19/15 2244 10/20/15 0653  BP: (!) 117/50 (!) 118/59 (!) 113/51 134/68  Pulse: 60 62 (!) 58 (!) 49  Resp: 18 18 18 18   Temp: 99.3 F (37.4 C) 99.1 F (37.3 C) 98.2 F (36.8 C) 98.2 F (36.8 C)  TempSrc: Oral Oral Oral Oral  SpO2: 98% 98% 95% 96%  Weight:      Height:        General: Elderly female not in distress neck  HEENT: Pallor present, moist mucosa, supple neck Chest: Clear bilaterally CVS: Normal S1 and S2, no murmurs GI: Soft, nondistended, nontender, bowel sounds present Musculoskeletal:  Warm, no edema CNS: Alert and oriented, fine tremors   The results of significant diagnostics from this hospitalization (including imaging, microbiology, ancillary and laboratory) are listed below for reference.     Microbiology: No results found for this or any previous visit (from the past 240 hour(s)).   Labs: BNP (last 3 results) No results for input(s): BNP in the last 8760 hours. Basic Metabolic Panel:  Recent Labs Lab 10/18/15 1502 10/19/15 1319 10/20/15 0608  NA 138 138 139  K 4.6 4.2 4.0  CL 105 107 108  CO2 20 22 24   GLUCOSE 100* 113* 103*  BUN 13 13 13   CREATININE 0.76 0.74 0.72  CALCIUM 9.1 8.9 8.7*   Liver Function Tests:  Recent Labs Lab 10/19/15 1319  AST 15  ALT 13*  ALKPHOS 99  BILITOT 0.5  PROT 7.7  ALBUMIN 3.9   No results for input(s): LIPASE, AMYLASE in the last 168 hours. No results for input(s): AMMONIA in the last 168 hours. CBC:  Recent Labs Lab 10/18/15 1502 10/19/15 1319 10/20/15 0608  WBC 7.0 6.2 6.2  NEUTROABS  --  4.1  --  HGB 7.9* 7.5* 8.6*  HCT 28.9* 26.9* 29.9*  MCV 62.9* 61.8* 65.6*  PLT 569* 539* 488*   Cardiac Enzymes: No results for input(s): CKTOTAL, CKMB, CKMBINDEX, TROPONINI in the last 168 hours. BNP: Invalid input(s): POCBNP CBG: No results for input(s): GLUCAP in the last 168 hours. D-Dimer No results for input(s): DDIMER in the last 72 hours. Hgb A1c No results for input(s): HGBA1C in the last 72 hours. Lipid Profile No results for input(s): CHOL, HDL, LDLCALC, TRIG, CHOLHDL, LDLDIRECT in the last 72 hours. Thyroid function studies No results for input(s): TSH, T4TOTAL, T3FREE, THYROIDAB in the last 72 hours.  Invalid input(s): FREET3 Anemia work up  Recent Labs  10/19/15 1432  VITAMINB12 257  FOLATE 11.7  FERRITIN 3*  TIBC 584*  IRON 12*  RETICCTPCT 1.4   Urinalysis    Component Value Date/Time   COLORURINE YELLOW 10/19/2015 1317   APPEARANCEUR CLOUDY (A) 10/19/2015 1317   LABSPEC  1.010 10/19/2015 1317   PHURINE 6.5 10/19/2015 1317   [place] 10/19/2015 1317   [place] 10/19/2015 1317   [place] 10/19/2015 1317   KETONESUR NEGATIVE 10/19/2015 1317   PROTEINUR NEGATIVE 10/19/2015 1317   UROBILINOGEN 0.2 07/25/2014 1505   NITRITE NEGATIVE 10/19/2015 1317   LEUKOCYTESUR LARGE (A) 10/19/2015 1317   Sepsis Labs Invalid input(s): PROCALCITONIN,  WBC,  LACTICIDVEN Microbiology No results found for this or any previous visit (from the past 240 hour(s)).   Time coordinating discharge: < 30 minutes  SIGNED:   Louellen Molder, MD  [facility] 10/20/2015, 10:03 AM Pager   If 7PM-7AM, please contact night-coverage [email or web address] Password [email or web address]

## 2015-10-22 LAB — TYPE AND SCREEN
ABO/RH(D): O POS
ANTIBODY SCREEN: NEGATIVE
UNIT DIVISION: 0

## 2015-10-22 LAB — URINE CULTURE

## 2015-10-29 DIAGNOSIS — D473 Essential (hemorrhagic) thrombocythemia: Secondary | ICD-10-CM | POA: Diagnosis not present

## 2015-10-29 DIAGNOSIS — R251 Tremor, unspecified: Secondary | ICD-10-CM | POA: Diagnosis not present

## 2015-10-29 DIAGNOSIS — N390 Urinary tract infection, site not specified: Secondary | ICD-10-CM | POA: Diagnosis not present

## 2015-10-29 DIAGNOSIS — R531 Weakness: Secondary | ICD-10-CM | POA: Diagnosis not present

## 2015-10-29 DIAGNOSIS — D649 Anemia, unspecified: Secondary | ICD-10-CM | POA: Diagnosis not present

## 2015-10-31 ENCOUNTER — Inpatient Hospital Stay (HOSPITAL_COMMUNITY)
Admission: EM | Admit: 2015-10-31 | Discharge: 2015-11-03 | DRG: 871 | Disposition: A | Discharge status: Home / Self Care | Payer: MEDICARE | Attending: Internal Medicine | Admitting: Internal Medicine

## 2015-10-31 ENCOUNTER — Encounter (HOSPITAL_COMMUNITY): Payer: Self-pay

## 2015-10-31 ENCOUNTER — Emergency Department (HOSPITAL_COMMUNITY): Payer: MEDICARE

## 2015-10-31 DIAGNOSIS — G250 Essential tremor: Secondary | ICD-10-CM | POA: Diagnosis present

## 2015-10-31 DIAGNOSIS — E669 Obesity, unspecified: Secondary | ICD-10-CM | POA: Diagnosis present

## 2015-10-31 DIAGNOSIS — Z820 Family history of epilepsy and other diseases of the nervous system: Secondary | ICD-10-CM | POA: Diagnosis not present

## 2015-10-31 DIAGNOSIS — Z880 Allergy status to penicillin: Secondary | ICD-10-CM | POA: Diagnosis not present

## 2015-10-31 DIAGNOSIS — Z882 Allergy status to sulfonamides status: Secondary | ICD-10-CM

## 2015-10-31 DIAGNOSIS — E876 Hypokalemia: Secondary | ICD-10-CM | POA: Diagnosis present

## 2015-10-31 DIAGNOSIS — R0602 Shortness of breath: Secondary | ICD-10-CM | POA: Diagnosis not present

## 2015-10-31 DIAGNOSIS — A419 Sepsis, unspecified organism: Principal | ICD-10-CM | POA: Diagnosis present

## 2015-10-31 DIAGNOSIS — R05 Cough: Secondary | ICD-10-CM | POA: Diagnosis not present

## 2015-10-31 DIAGNOSIS — Z79899 Other long term (current) drug therapy: Secondary | ICD-10-CM | POA: Diagnosis not present

## 2015-10-31 DIAGNOSIS — Z6831 Body mass index (BMI) 31.0-31.9, adult: Secondary | ICD-10-CM | POA: Diagnosis not present

## 2015-10-31 DIAGNOSIS — I10 Essential (primary) hypertension: Secondary | ICD-10-CM | POA: Diagnosis not present

## 2015-10-31 DIAGNOSIS — Y95 Nosocomial condition: Secondary | ICD-10-CM | POA: Diagnosis present

## 2015-10-31 DIAGNOSIS — R509 Fever, unspecified: Secondary | ICD-10-CM | POA: Diagnosis not present

## 2015-10-31 DIAGNOSIS — I959 Hypotension, unspecified: Secondary | ICD-10-CM | POA: Diagnosis present

## 2015-10-31 DIAGNOSIS — J189 Pneumonia, unspecified organism: Secondary | ICD-10-CM

## 2015-10-31 DIAGNOSIS — D509 Iron deficiency anemia, unspecified: Secondary | ICD-10-CM | POA: Diagnosis present

## 2015-10-31 DIAGNOSIS — Z801 Family history of malignant neoplasm of trachea, bronchus and lung: Secondary | ICD-10-CM

## 2015-10-31 DIAGNOSIS — Z66 Do not resuscitate: Secondary | ICD-10-CM | POA: Diagnosis present

## 2015-10-31 DIAGNOSIS — Z8249 Family history of ischemic heart disease and other diseases of the circulatory system: Secondary | ICD-10-CM

## 2015-10-31 DIAGNOSIS — N390 Urinary tract infection, site not specified: Secondary | ICD-10-CM | POA: Diagnosis present

## 2015-10-31 DIAGNOSIS — R079 Chest pain, unspecified: Secondary | ICD-10-CM | POA: Diagnosis not present

## 2015-10-31 HISTORY — DX: Pneumonia, unspecified organism: J18.9

## 2015-10-31 LAB — URINALYSIS, ROUTINE W REFLEX MICROSCOPIC
Bilirubin Urine: NEGATIVE
GLUCOSE, UA: NEGATIVE mg/dL
Ketones, ur: NEGATIVE mg/dL
Nitrite: NEGATIVE
PROTEIN: NEGATIVE mg/dL
Specific Gravity, Urine: 1.011 (ref 1.005–1.030)
pH: 6 (ref 5.0–8.0)

## 2015-10-31 LAB — I-STAT CG4 LACTIC ACID, ED: LACTIC ACID, VENOUS: 1.07 mmol/L (ref 0.5–1.9)

## 2015-10-31 LAB — CBC WITH DIFFERENTIAL/PLATELET
BASOS PCT: 0 %
Basophils Absolute: 0 10*3/uL (ref 0.0–0.1)
EOS PCT: 0 %
Eosinophils Absolute: 0 10*3/uL (ref 0.0–0.7)
HEMATOCRIT: 32.4 % — AB (ref 36.0–46.0)
HEMOGLOBIN: 9.5 g/dL — AB (ref 12.0–15.0)
LYMPHS PCT: 4 %
Lymphs Abs: 0.7 10*3/uL (ref 0.7–4.0)
MCH: 20.8 pg — AB (ref 26.0–34.0)
MCHC: 29.3 g/dL — ABNORMAL LOW (ref 30.0–36.0)
MCV: 71.1 fL — AB (ref 78.0–100.0)
Monocytes Absolute: 0.9 10*3/uL (ref 0.1–1.0)
Monocytes Relative: 5 %
NEUTROS PCT: 91 %
Neutro Abs: 16.1 10*3/uL — ABNORMAL HIGH (ref 1.7–7.7)
Platelets: 400 10*3/uL (ref 150–400)
RBC: 4.56 MIL/uL (ref 3.87–5.11)
RDW: 27.5 % — ABNORMAL HIGH (ref 11.5–15.5)
WBC: 17.7 10*3/uL — ABNORMAL HIGH (ref 4.0–10.5)

## 2015-10-31 LAB — URINE MICROSCOPIC-ADD ON

## 2015-10-31 LAB — COMPREHENSIVE METABOLIC PANEL
ALBUMIN: 3.8 g/dL (ref 3.5–5.0)
ALT: 11 U/L — AB (ref 14–54)
ANION GAP: 10 (ref 5–15)
AST: 15 U/L (ref 15–41)
Alkaline Phosphatase: 77 U/L (ref 38–126)
BUN: 13 mg/dL (ref 6–20)
CHLORIDE: 105 mmol/L (ref 101–111)
CO2: 20 mmol/L — AB (ref 22–32)
Calcium: 8.5 mg/dL — ABNORMAL LOW (ref 8.9–10.3)
Creatinine, Ser: 0.72 mg/dL (ref 0.44–1.00)
GFR calc non Af Amer: >60 mL/min (ref >60)
GLUCOSE: 131 mg/dL — AB (ref 65–99)
Potassium: 3.4 mmol/L — ABNORMAL LOW (ref 3.5–5.1)
SODIUM: 135 mmol/L (ref 135–145)
Total Bilirubin: 0.9 mg/dL (ref 0.3–1.2)
Total Protein: 7 g/dL (ref 6.5–8.1)

## 2015-10-31 MED ORDER — VANCOMYCIN HCL IN DEXTROSE 1-5 GM/200ML-% IV SOLN: 1000.0000 mg | Freq: Once | INTRAVENOUS | Status: DC

## 2015-10-31 MED ORDER — ONDANSETRON HCL 4 MG/2ML IJ SOLN: 4.0000 mg | Freq: Four times a day (QID) | INTRAMUSCULAR | Status: DC | PRN

## 2015-10-31 MED ORDER — FERROUS SULFATE 325 (65 FE) MG PO TABS
325.0000 mg | ORAL_TABLET | Freq: Two times a day (BID) | ORAL | Status: DC
  Administered 2015-11-01 – 2015-11-03 (×5): 325 mg via ORAL
  Filled 2015-10-31 (×5): qty 1

## 2015-10-31 MED ORDER — ONDANSETRON HCL 4 MG PO TABS
4.0000 mg | ORAL_TABLET | Freq: Four times a day (QID) | ORAL | Status: DC | PRN
Start: 2015-10-31 — End: 2015-11-03

## 2015-10-31 MED ORDER — ACETAMINOPHEN 325 MG PO TABS: 650.0000 mg | ORAL_TABLET | Freq: Four times a day (QID) | ORAL | Status: DC | PRN

## 2015-10-31 MED ORDER — CLONIDINE HCL 0.1 MG PO TABS
0.1000 mg | ORAL_TABLET | Freq: Two times a day (BID) | ORAL | Status: DC
  Administered 2015-11-01 – 2015-11-03 (×6): 0.1 mg via ORAL
  Filled 2015-10-31 (×6): qty 1

## 2015-10-31 MED ORDER — ENOXAPARIN SODIUM 40 MG/0.4ML ~~LOC~~ SOLN
40.0000 mg | SUBCUTANEOUS | Status: DC
  Administered 2015-11-01 – 2015-11-03 (×3): 40 mg via SUBCUTANEOUS
  Filled 2015-10-31 (×3): qty 0.4

## 2015-10-31 MED ORDER — DEXTROSE 5 % IV SOLN
2.0000 g | Freq: Once | INTRAVENOUS | Status: AC
  Administered 2015-10-31: 2 g via INTRAVENOUS
  Filled 2015-10-31: qty 2

## 2015-10-31 MED ORDER — ACETAMINOPHEN 325 MG PO TABS
650.0000 mg | ORAL_TABLET | Freq: Once | ORAL | Status: AC | PRN
  Administered 2015-10-31: 650 mg via ORAL
  Filled 2015-10-31: qty 2

## 2015-10-31 MED ORDER — SODIUM CHLORIDE 0.9 % IV SOLN
INTRAVENOUS | Status: DC
  Administered 2015-11-01: 02:00:00 via INTRAVENOUS

## 2015-10-31 MED ORDER — ACETAMINOPHEN 650 MG RE SUPP: 650.0000 mg | Freq: Four times a day (QID) | RECTAL | Status: DC | PRN

## 2015-10-31 MED ORDER — AMLODIPINE BESYLATE 5 MG PO TABS
5.0000 mg | ORAL_TABLET | Freq: Every day | ORAL | Status: DC
  Administered 2015-11-01 – 2015-11-03 (×3): 5 mg via ORAL
  Filled 2015-10-31 (×3): qty 1

## 2015-10-31 MED ORDER — VANCOMYCIN HCL 10 G IV SOLR
1500.0000 mg | INTRAVENOUS | Status: AC
  Administered 2015-10-31: 1500 mg via INTRAVENOUS
  Filled 2015-10-31: qty 1500

## 2015-10-31 MED ORDER — DEXTROSE 5 % IV SOLN
1.0000 g | Freq: Three times a day (TID) | INTRAVENOUS | Status: DC
  Administered 2015-11-01 – 2015-11-02 (×4): 1 g via INTRAVENOUS
  Filled 2015-10-31 (×6): qty 1

## 2015-10-31 MED ORDER — SODIUM CHLORIDE 0.9% FLUSH
3.0000 mL | Freq: Two times a day (BID) | INTRAVENOUS | Status: DC
  Administered 2015-10-31 – 2015-11-03 (×5): 3 mL via INTRAVENOUS

## 2015-10-31 MED ORDER — VANCOMYCIN HCL IN DEXTROSE 750-5 MG/150ML-% IV SOLN
750.0000 mg | Freq: Two times a day (BID) | INTRAVENOUS | Status: DC
  Administered 2015-11-01 – 2015-11-02 (×3): 750 mg via INTRAVENOUS
  Filled 2015-10-31 (×4): qty 150

## 2015-10-31 MED ORDER — POLYETHYLENE GLYCOL 3350 17 G PO PACK
17.0000 g | PACK | Freq: Every day | ORAL | Status: DC | PRN
  Administered 2015-11-01 – 2015-11-02 (×2): 17 g via ORAL
  Filled 2015-10-31 (×2): qty 1

## 2015-10-31 NOTE — ED Triage Notes (Addendum)
PT RECEIVED VIA EMS C/O FEVER AND CHILLS TODAY. PT RECENTLY DX WITH A UTI X1 WEEK AGO. INITIAL O2 SAT 92% RA, O2 GIVEN AT 2L VIA [place] 98%. PT DENIES URINARY SYMPTOMS OR PAIN AT THIS TIME. PER EMS, PT REFUSED TYLENOL PTA.

## 2015-10-31 NOTE — Progress Notes (Signed)
Pharmacy Antibiotic Note  BRITIANY SOCKS is a 82 y.o. female with PMHx HTN, atrial tachycardia, and benign familial tremors recently admitted with symptomatic anemia and also started outpatient on Cipro for UTI  presents on 10/31/2015 with fever and chills.  Pharmacy has been consulted for Aztreonam and Vancomycin dosing for HCAP.  CXR reflects atelectasis vs mild infection. Noted allergy to PCNs (hives, SOB, rash), sulfa (rash), and levaquin (joint aches / weakness).   First doses ordered in ED.   CrCl ~46 N / 50 CG  Plan: Vancomycin 1500mg  IV x1, then 750mg  IV q12h Aztreonam 2g IV x1, then 1g IV q8h F/u renal function, cultures, VT at Css as warranted, clinical course  Height: 5\' 2"  (157.5 cm) Weight: 172 lb (78 kg) IBW/kg (Calculated) : 50.1  Temp (24hrs), Avg:101.6 F (38.7 C), Min:101.6 F (38.7 C), Max:101.6 F (38.7 C)  No results for input(s): WBC, CREATININE, LATICACIDVEN, VANCOTROUGH, VANCOPEAK, VANCORANDOM, GENTTROUGH, GENTPEAK, GENTRANDOM, TOBRATROUGH, TOBRAPEAK, TOBRARND, AMIKACINPEAK, AMIKACINTROU, AMIKACIN in the last 168 hours.  Estimated Creatinine Clearance: 49.8 mL/min (by C-G formula based on SCr of 0.72 mg/dL).    Allergies  Allergen Reactions  . Aspirin Hives  . Codeine Other (See Comments)    REACTION: GI upset  . Hydrochlorothiazide Rash  . Penicillins Hives, Shortness Of Breath and Rash    Has patient had a PCN reaction causing immediate rash, facial/tongue/throat swelling, SOB or lightheadedness with hypotension: yes Has patient had a PCN reaction causing severe rash involving mucus membranes or skin necrosis: unknown Has patient had a PCN reaction that required hospitalization : unknown Has patient had a PCN reaction occurring within the last 10 years: no If all of the above answers are "NO", then may proceed with Cephalosporin use.   . Sulfonamide Derivatives Rash  . Levofloxacin Other (See Comments)    Joint aches and weakness  . Macrodantin  [Nitrofurantoin] Other (See Comments)    Unknown reaction    Antimicrobials this admission: 9/20 Vancomycin  >>  9/20 Aztreonam >>   Dose adjustments this admission:  Microbiology results: 9/20 BCx: ordered 9/20 UCx: ordered   Thank you for allowing pharmacy to be a part of this patient's care.  Ralene Bathe, PharmD, BCPS 10/31/2015, 7:48 PM  Pager: [phone]

## 2015-10-31 NOTE — ED Notes (Signed)
Bed: [id number] Expected date:  Expected time:  Means of arrival:  Comments: EMS-fever/UTI

## 2015-10-31 NOTE — H&P (Signed)
[facility] History and Physical   Patient: [name] [name] [id number]   PCP: Harle Battiest, MD DOB: 01-15-1928   DOA: 10/31/2015   DOS: 10/31/2015   DOS: the patient was seen and examined on 10/31/2015  Patient coming from: The patient is coming from home.  Chief Complaint: Fever with chills  HPI: [name] [name] is a 82 y.o. female with Past medical history of HTN, benign tremors, anemia. Patient presented with complaints of fever with chills. Recorded temperature at home was 102. Patient also had his sensation off left-sided congestion. Denies any chest pain or back pain or burning urination or blood in the urine or diarrhea or constipation. Recently seen by PCP and was started on antibiotics, patient mentions it started with "N" although documentation is for ciprofloxacin. Patient has taken only 2 doses. Denies any other recent change in medication.  ED Course: Patient was found hypotensive with febrile. Started on IV antibiotics and admitted  At her baseline ambulates without any support And is independent for most of her ADL; manages her medication on her own.  Review of Systems: as mentioned in the history of present illness.  All other systems reviewed and are negative.  Past Medical History:  Diagnosis Date  . Atrial tachycardia ([place])   . HTN (hypertension)   . Jaundice due to hepatitis    at age 34 or 82  . Transient global amnesia    AMS  . Tremors of nervous system    benign familial   Past Surgical History:  Procedure Laterality Date  . ABDOMINAL HERNIA REPAIR    . BACK SURGERY     years ago  . TONSILLECTOMY     Social History:  reports that she has never smoked. She has never used smokeless tobacco. She reports that she does not drink alcohol. Her drug history is not on file.  Allergies  Allergen Reactions  . Aspirin Hives  . Codeine Other (See Comments)    REACTION: GI upset  . Hydrochlorothiazide Rash  . Penicillins Hives, Shortness Of Breath and  Rash    Has patient had a PCN reaction causing immediate rash, facial/tongue/throat swelling, SOB or lightheadedness with hypotension: yes Has patient had a PCN reaction causing severe rash involving mucus membranes or skin necrosis: unknown Has patient had a PCN reaction that required hospitalization : unknown Has patient had a PCN reaction occurring within the last 10 years: no If all of the above answers are "NO", then may proceed with Cephalosporin use.   . Sulfonamide Derivatives Rash  . Levofloxacin Other (See Comments)    Joint aches and weakness  . Macrodantin [Nitrofurantoin] Other (See Comments)    Unknown reaction     Family History  Problem Relation Age of Onset  . Lung cancer Mother   . Parkinson's disease Mother   . Hypertension Mother   . Other Father     blood clot  . Arthritis Sister   . Other Sister     spinal stenosis     Prior to Admission medications   Medication Sig Start Date End Date Taking? Authorizing Provider  amLODipine (NORVASC) 10 MG tablet Take 1 tablet (10 mg total) by mouth daily. 10/18/15  Yes Minus Breeding, MD  ciprofloxacin (CIPRO) 500 MG tablet Take 500 mg by mouth 2 (two) times daily. pm9/19/17-9/24/17am 10/31/15  Yes Historical Provider, MD  cloNIDine (CATAPRES) 0.2 MG tablet Pt takes 1 tablet in the morning and 1.5 mg at bedtime 10/18/15  Yes Minus Breeding, MD  ferrous sulfate 325 (65 FE) MG tablet Take 1 tablet (325 mg total) by mouth 2 (two) times daily with a meal. 10/20/15  Yes Nishant Dhungel, MD  metoprolol tartrate (LOPRESSOR) 25 MG tablet Take 0.5 tablets (12.5 mg total) by mouth 2 (two) times daily. 08/08/15  Yes Minus Breeding, MD  polyethylene glycol (MIRALAX / GLYCOLAX) packet Take 17 g by mouth daily as needed for mild constipation. 10/20/15  Yes Nishant Dhungel, MD  spironolactone (ALDACTONE) 50 MG tablet Take 1 tablet (50 mg total) by mouth daily. 08/08/15  Yes Minus Breeding, MD    Physical Exam: Vitals:   10/31/15 1757 10/31/15  2007 10/31/15 2023 10/31/15 2227  BP:   130/61 125/65  Pulse:   66 (!) 59  Resp:   (!) 30 22  Temp:  98.4 F (36.9 C)    TempSrc:  Oral    SpO2:   93% 95%  Weight: 78 kg (172 lb)     Height: 5\' 2"  (1.575 m)       General: Alert, Awake and Oriented to Time, Place and Person. Appear in mild distress, affect appropriate Eyes: PERRL, Conjunctiva normal ENT: Oral Mucosa clear moist. Neck: no JVD, no Abnormal Mass Or lumps Cardiovascular: S1 and S2 Present, no Murmur, Peripheral Pulses Present Respiratory: Bilateral Air entry equal and Decreased, no use of accessory muscle, left basal Crackles, no wheezes Abdomen: Bowel Sound present, Soft and no tenderness Skin: no redness, no Rash, no induration Extremities: no Pedal edema, no calf tenderness Neurologic: Grossly no focal neuro deficit. Bilaterally Equal motor strength  Labs on Admission:  CBC:  Recent Labs Lab 10/31/15 2009  WBC 17.7*  NEUTROABS 16.1*  HGB 9.5*  HCT 32.4*  MCV 71.1*  PLT [id number]   Basic Metabolic Panel:  Recent Labs Lab 10/31/15 2009  NA 135  K 3.4*  CL 105  CO2 20*  GLUCOSE 131*  BUN 13  CREATININE 0.72  CALCIUM 8.5*   GFR: Estimated Creatinine Clearance: 49.8 mL/min (by C-G formula based on SCr of 0.72 mg/dL). Liver Function Tests:  Recent Labs Lab 10/31/15 2009  AST 15  ALT 11*  ALKPHOS 77  BILITOT 0.9  PROT 7.0  ALBUMIN 3.8   No results for input(s): LIPASE, AMYLASE in the last 168 hours. No results for input(s): AMMONIA in the last 168 hours. Coagulation Profile: No results for input(s): INR, PROTIME in the last 168 hours. Cardiac Enzymes: No results for input(s): CKTOTAL, CKMB, CKMBINDEX, TROPONINI in the last 168 hours. BNP (last 3 results) No results for input(s): PROBNP in the last 8760 hours. HbA1C: No results for input(s): HGBA1C in the last 72 hours. CBG: No results for input(s): GLUCAP in the last 168 hours. Lipid Profile: No results for input(s): CHOL, HDL, LDLCALC,  TRIG, CHOLHDL, LDLDIRECT in the last 72 hours. Thyroid Function Tests: No results for input(s): TSH, T4TOTAL, FREET4, T3FREE, THYROIDAB in the last 72 hours. Anemia Panel: No results for input(s): VITAMINB12, FOLATE, FERRITIN, TIBC, IRON, RETICCTPCT in the last 72 hours. Urine analysis:    Component Value Date/Time   COLORURINE YELLOW 10/31/2015 2202   APPEARANCEUR CLOUDY (A) 10/31/2015 2202   LABSPEC 1.011 10/31/2015 2202   PHURINE 6.0 10/31/2015 2202   GLUCOSEU NEGATIVE 10/31/2015 2202   HGBUR TRACE (A) 10/31/2015 2202   BILIRUBINUR NEGATIVE 10/31/2015 2202   KETONESUR NEGATIVE 10/31/2015 2202   PROTEINUR NEGATIVE 10/31/2015 2202   UROBILINOGEN 0.2 07/25/2014 1505   NITRITE NEGATIVE 10/31/2015 2202   LEUKOCYTESUR LARGE (A) 10/31/2015 2202  Radiological Exams on Admission: Dg Chest 2 View  Result Date: 10/31/2015 CLINICAL DATA:  Acute onset of congested cough and fever. Left-sided chest pain and shortness of breath. Initial encounter. EXAM: CHEST  2 VIEW COMPARISON:  None. FINDINGS: The lungs are well-aerated. Mild vascular congestion is noted. Mild left basilar opacity may reflect atelectasis or possibly mild infection. There is no evidence of pleural effusion or pneumothorax. The heart is enlarged. A moderate to large hiatal hernia is noted. No acute osseous abnormalities are seen. IMPRESSION: 1. Mild vascular congestion and cardiomegaly. Mild left basilar airspace opacity may reflect atelectasis or possibly mild infection. 2. Moderate to large hiatal hernia noted. Electronically Signed   By: Garald Balding M.D.   On: 10/31/2015 18:38    Assessment/Plan 1. HCAP (healthcare-associated pneumonia) Patient was recently hospitalized for symptomatic anemia. Chest x-ray shows left basal infiltrate. With leukocytosis fever patient meeting sepsis criteria. Lactic acid is stable. We will check pro-calcitonin level. Continue IV vancomycin and aztreonam due to patient's allergy  list. Monitor blood culture.  2. Essential hypertension. In the setting of sepsis currently reducing patient's home antihypertensive medication dosing. Continue to closely monitor. Holding diuretic.  3. Iron deficiency anemia. Recent PRBC transfusion. Outpatient H&H was 10.5 a few days ago. We will continue to monitor H&H.   Nutrition: Cardiac diet DVT Prophylaxis: subcutaneous Heparin  Advance goals of care discussion: DNR/DNI   Consults: none  Family Communication: family was present at bedside, at the time of interview.  Opportunity was given to ask question and all questions were answered satisfactorily.  Disposition: Admitted as inpatient, telemetry unit. Likely to be discharged home, in 3 days.  Author: Berle Mull, MD [facility] Pager: [phone] 10/31/2015  If 7PM-7AM, please contact night-coverage [email or web address] Password [email or web address]

## 2015-10-31 NOTE — ED Provider Notes (Signed)
[facility] DEPT Provider Note   CSN: [id number] Arrival date & time: 10/31/15  1738     History   Chief Complaint Chief Complaint  Patient presents with  . Fever    HPI [name] [name] is a 82 y.o. female.  HPI    82 year old female with history of anemia, hypertension, cardiac dysrhythmia presenting for evaluation of a fever. Patient states she felt feverish this morning, her temperature was 102.1 home. She also endorsed new onset of chest congestion, along with cough that she had for the past month. She does feels weak. She denies anything prior to arrival. She was admitted approximately 2 weeks ago for symptomatic anemia, and received 1 unit of blood product.  Pt sts she was diagnosed with having a UTI during her hospitalization despite not having any dysuria.  She was seen by her PCP 2 days ago.  He placed pt on cipro for her UTI.  She took it yesterday and today.  She felt her fever may be related to her abx.  At this time she denies having any headache, neck pain, active chest pain, abdominal pain, back pain, dysuria, bowel bladder problem, nausea vomiting diarrhea.  Past Medical History:  Diagnosis Date  . Atrial tachycardia ([place])   . HTN (hypertension)   . Jaundice due to hepatitis    at age 84 or 52  . Transient global amnesia    AMS  . Tremors of nervous system    benign familial    Patient Active Problem List   Diagnosis Date Noted  . Iron deficiency anemia 10/20/2015  . B12 deficiency anemia 10/20/2015  . Symptomatic anemia 10/19/2015  . UTI (urinary tract infection) 07/26/2014  . TGA (transient global amnesia) 07/25/2014  . Cardiac dysrhythmia 02/06/2010  . PALPITATIONS 02/06/2010  . CHICKENPOX, HX OF 09/07/2008  . Essential hypertension, benign 05/22/2008  . PSVT 03/27/2008  . OBESITY, UNSPECIFIED 03/26/2008  . Abnormal involuntary movement 03/26/2008  . AMNESIA, TRANSIENT GLOBAL 01/10/2007    Past Surgical History:  Procedure Laterality Date  .  ABDOMINAL HERNIA REPAIR    . BACK SURGERY     years ago  . TONSILLECTOMY      OB History    No data available       Home Medications    Prior to Admission medications   Medication Sig Start Date End Date Taking? Authorizing Provider  amLODipine (NORVASC) 10 MG tablet Take 1 tablet (10 mg total) by mouth daily. 10/18/15  Yes Minus Breeding, MD  ciprofloxacin (CIPRO) 500 MG tablet Take 500 mg by mouth 2 (two) times daily. pm9/19/17-9/24/17am 10/31/15  Yes Historical Provider, MD  cloNIDine (CATAPRES) 0.2 MG tablet Pt takes 1 tablet in the morning and 1.5 mg at bedtime 10/18/15  Yes Minus Breeding, MD  ferrous sulfate 325 (65 FE) MG tablet Take 1 tablet (325 mg total) by mouth 2 (two) times daily with a meal. 10/20/15  Yes Nishant Dhungel, MD  metoprolol tartrate (LOPRESSOR) 25 MG tablet Take 0.5 tablets (12.5 mg total) by mouth 2 (two) times daily. 08/08/15  Yes Minus Breeding, MD  polyethylene glycol (MIRALAX / GLYCOLAX) packet Take 17 g by mouth daily as needed for mild constipation. 10/20/15  Yes Nishant Dhungel, MD  spironolactone (ALDACTONE) 50 MG tablet Take 1 tablet (50 mg total) by mouth daily. 08/08/15  Yes Minus Breeding, MD    Family History Family History  Problem Relation Age of Onset  . Lung cancer Mother   . Parkinson's disease Mother   .  Hypertension Mother   . Other Father     blood clot  . Arthritis Sister   . Other Sister     spinal stenosis    Social History Social History  Substance Use Topics  . Smoking status: Never Smoker  . Smokeless tobacco: Never Used     Comment: + prior 2nd hand exposure from spouse smoking  . Alcohol use No     Allergies   Aspirin; Codeine; Hydrochlorothiazide; Penicillins; Sulfonamide derivatives; Levofloxacin; and Macrodantin [nitrofurantoin]   Review of Systems Review of Systems  All other systems reviewed and are negative.    Physical Exam Updated Vital Signs BP 119/69 (BP Location: Left Arm)   Pulse 78   Temp 101.6  F (38.7 C) (Oral)   Resp 17   Ht 5\' 2"  (1.575 m)   Wt 78 kg   SpO2 93%   BMI 31.46 kg/m   Physical Exam  Constitutional: She is oriented to person, place, and time. She appears well-developed and well-nourished. No distress.  HENT:  Head: Atraumatic.  Mouth/Throat: Oropharynx is clear and moist.  Eyes: Conjunctivae are normal.  Neck: Neck supple.  Cardiovascular: Normal rate, regular rhythm and intact distal pulses.   Murmur heard. Pulmonary/Chest: Effort normal and breath sounds normal. No respiratory distress. She has no wheezes. She has no rales.  Abdominal: Soft. There is no tenderness.  Neurological: She is alert and oriented to person, place, and time.  Skin: Skin is warm. No rash noted.  Psychiatric: She has a normal mood and affect.  Nursing note and vitals reviewed.    ED Treatments / Results  Labs (all labs ordered are listed, but only abnormal results are displayed) Labs Reviewed  COMPREHENSIVE METABOLIC PANEL - Abnormal; Notable for the following:       Result Value   Potassium 3.4 (*)    CO2 20 (*)    Glucose, Bld 131 (*)    Calcium 8.5 (*)    ALT 11 (*)    All other components within normal limits  CBC WITH DIFFERENTIAL/PLATELET - Abnormal; Notable for the following:    WBC 17.7 (*)    Hemoglobin 9.5 (*)    HCT 32.4 (*)    MCV 71.1 (*)    MCH 20.8 (*)    MCHC 29.3 (*)    RDW 27.5 (*)    Neutro Abs 16.1 (*)    All other components within normal limits  URINALYSIS, ROUTINE W REFLEX MICROSCOPIC (NOT AT [facility]) - Abnormal; Notable for the following:    APPearance CLOUDY (*)    Hgb urine dipstick TRACE (*)    Leukocytes, UA LARGE (*)    All other components within normal limits  URINE MICROSCOPIC-ADD ON - Abnormal; Notable for the following:    Squamous Epithelial / LPF 0-5 (*)    Bacteria, UA MANY (*)    All other components within normal limits  CULTURE, BLOOD (ROUTINE X 2)  CULTURE, BLOOD (ROUTINE X 2)  URINE CULTURE  I-STAT CG4 LACTIC ACID, ED    I-STAT CG4 LACTIC ACID, ED    EKG  EKG Interpretation None     ED ECG REPORT   Date: 10/31/2015  Rate: 61  Rhythm: normal sinus rhythm  QRS Axis: left  Intervals: normal  ST/T Wave abnormalities: nonspecific ST/T changes  Conduction Disutrbances:none  Narrative Interpretation:   Old EKG Reviewed: unchanged  I have personally reviewed the EKG tracing and agree with the computerized printout as noted.   Radiology Dg Chest  2 View  Result Date: 10/31/2015 CLINICAL DATA:  Acute onset of congested cough and fever. Left-sided chest pain and shortness of breath. Initial encounter. EXAM: CHEST  2 VIEW COMPARISON:  None. FINDINGS: The lungs are well-aerated. Mild vascular congestion is noted. Mild left basilar opacity may reflect atelectasis or possibly mild infection. There is no evidence of pleural effusion or pneumothorax. The heart is enlarged. A moderate to large hiatal hernia is noted. No acute osseous abnormalities are seen. IMPRESSION: 1. Mild vascular congestion and cardiomegaly. Mild left basilar airspace opacity may reflect atelectasis or possibly mild infection. 2. Moderate to large hiatal hernia noted. Electronically Signed   By: Garald Balding M.D.   On: 10/31/2015 18:38    Procedures Procedures (including critical care time)  Medications Ordered in ED Medications  vancomycin (VANCOCIN) 1,500 mg in sodium chloride 0.9 % 500 mL IVPB (1,500 mg Intravenous New Bag/Given 10/31/15 2147)    Followed by  vancomycin (VANCOCIN) IVPB 750 mg/150 ml premix (not administered)  aztreonam (AZACTAM) 1 g in dextrose 5 % 50 mL IVPB (not administered)  acetaminophen (TYLENOL) tablet 650 mg (650 mg Oral Given 10/31/15 1807)  aztreonam (AZACTAM) 2 g in dextrose 5 % 50 mL IVPB (0 g Intravenous Stopped 10/31/15 2146)     Initial Impression / Assessment and Plan / ED Course  I have reviewed the triage vital signs and the nursing notes.  Pertinent labs & imaging results that were available  during my care of the patient were reviewed by me and considered in my medical decision making (see chart for details).  Clinical Course   BP 125/65 (BP Location: Right Arm)   Pulse (!) 59   Temp 98.4 F (36.9 C) (Oral)   Resp 22   Ht 5\' 2"  (1.575 m)   Wt 78 kg   SpO2 95%   BMI 31.46 kg/m    Final Clinical Impressions(s) / ED Diagnoses   Final diagnoses:  HCAP (healthcare-associated pneumonia)  UTI (lower urinary tract infection)    New Prescriptions New Prescriptions   No medications on file   7:36 PM Patient presents with fever, congestion, and a prolonged cough. She has a documented temperature of 101.6 in the ED. Her chest x-ray showing finding concerning for pneumonia. Her vital signs otherwise stable. Since patient had recent hospitalization, she'll be treated for hospital-acquired pneumonia. Workup initiated. Antibiotic begins.  10:51 PM UA with finding consistent with UTI, pt currently on Cipro.  She does have leukocytosis with WBC 17.7.  Her hgb is stable at 9.5, improves from prior.  Will consult medicine for admission.    11:37 PM Appreciate consultation from [place], Dr. Posey Pronto who agrees to see pt in the ER and will admit for further care.  Pt is stable at this time.    Domenic Moras, PA-C 10/31/15 [id number]    Charlesetta Shanks, MD 11/01/15 [phone]

## 2015-11-01 ENCOUNTER — Encounter (HOSPITAL_COMMUNITY): Payer: Self-pay

## 2015-11-01 LAB — CBC WITH DIFFERENTIAL/PLATELET
BASOS ABS: 0 10*3/uL (ref 0.0–0.1)
Basophils Relative: 0 %
EOS ABS: 0.5 10*3/uL (ref 0.0–0.7)
Eosinophils Relative: 5 %
HEMATOCRIT: 31.1 % — AB (ref 36.0–46.0)
HEMOGLOBIN: 9 g/dL — AB (ref 12.0–15.0)
LYMPHS PCT: 10 %
Lymphs Abs: 1.1 10*3/uL (ref 0.7–4.0)
MCH: 20.8 pg — ABNORMAL LOW (ref 26.0–34.0)
MCHC: 28.9 g/dL — ABNORMAL LOW (ref 30.0–36.0)
MCV: 71.8 fL — ABNORMAL LOW (ref 78.0–100.0)
MONOS PCT: 8 %
Monocytes Absolute: 0.8 10*3/uL (ref 0.1–1.0)
Neutro Abs: 8.2 10*3/uL — ABNORMAL HIGH (ref 1.7–7.7)
Neutrophils Relative %: 77 %
Platelets: 371 10*3/uL (ref 150–400)
RBC: 4.33 MIL/uL (ref 3.87–5.11)
RDW: 27.9 % — AB (ref 11.5–15.5)
WBC: 10.6 10*3/uL — AB (ref 4.0–10.5)

## 2015-11-01 LAB — COMPREHENSIVE METABOLIC PANEL
ALBUMIN: 3.2 g/dL — AB (ref 3.5–5.0)
ALK PHOS: 64 U/L (ref 38–126)
ALT: 10 U/L — AB (ref 14–54)
AST: 13 U/L — AB (ref 15–41)
Anion gap: 5 (ref 5–15)
BUN: 10 mg/dL (ref 6–20)
CALCIUM: 8.2 mg/dL — AB (ref 8.9–10.3)
CO2: 24 mmol/L (ref 22–32)
CREATININE: 0.6 mg/dL (ref 0.44–1.00)
Chloride: 110 mmol/L (ref 101–111)
GFR calc Af Amer: >60 mL/min (ref >60)
GFR calc non Af Amer: >60 mL/min (ref >60)
GLUCOSE: 106 mg/dL — AB (ref 65–99)
Potassium: 3.3 mmol/L — ABNORMAL LOW (ref 3.5–5.1)
SODIUM: 139 mmol/L (ref 135–145)
Total Bilirubin: 0.5 mg/dL (ref 0.3–1.2)
Total Protein: 6.4 g/dL — ABNORMAL LOW (ref 6.5–8.1)

## 2015-11-01 LAB — PROCALCITONIN: PROCALCITONIN: 0.21 ng/mL

## 2015-11-01 LAB — MAGNESIUM: Magnesium: 2 mg/dL (ref 1.7–2.4)

## 2015-11-01 MED ORDER — MIRTAZAPINE 7.5 MG PO TABS
3.5000 mg | ORAL_TABLET | Freq: Every evening | ORAL | Status: DC | PRN
  Filled 2015-11-01: qty 1

## 2015-11-01 MED ORDER — POTASSIUM CHLORIDE CRYS ER 20 MEQ PO TBCR
40.0000 meq | EXTENDED_RELEASE_TABLET | Freq: Once | ORAL | Status: AC
  Administered 2015-11-01: 40 meq via ORAL
  Filled 2015-11-01: qty 2

## 2015-11-01 MED ORDER — MIRTAZAPINE 7.5 MG PO TABS
3.5000 mg | ORAL_TABLET | Freq: Two times a day (BID) | ORAL | Status: DC | PRN
  Filled 2015-11-01: qty 1

## 2015-11-01 NOTE — Progress Notes (Addendum)
Patient ID: [name] [name], female   DOB: [birth year], 82 y.o.   MRN: [id number]    PROGRESS NOTE    RAYLEEN WYRICK  [id number] DOB: [birth year] DOA: 10/31/2015  PCP: Harle Battiest, MD   Brief Narrative:  82 y.o. female with HTN, benign tremors, anemia, presented to [facility] ED with main concern of several days duration of progressively worsening dyspnea, subjective fevers, chills, poor oral intake. Temp at home was 102 F. In ED, imaging studies notable for left lobe PNA. Pt started on vancomycin and aztreonam and TRH asked to admit for further evaluation and treatment.  Assessment & Plan:   Principal Problem:   Sepsis secondary to HCAP (healthcare-associated pneumonia), ? UT - pt met criteria for sepsis with T 101.19F, WBC 17 K, source PNA and possibly UTI - pt clinically improving, Tmax 2F overnight, WBC trending down from 17 K --> 10 K - continue current abx regimen day #2 - follow up on urine cultures  - provide antitussives as needed, flutter valve - keep on oxygen via [place] if needed   Active Problems:   Essential hypertension, benign - reasonable inpatient control     Iron deficiency anemia - Hg 9.5 this AM, no signs of active bleeding - CBC In AM    Hypokalemia - mild, supplement and repeat BMP in AM  DVT prophylaxis: Lovenox SQ Code Status: DNR Family Communication: Patient and husband at bedside, daughter over the phone  Disposition Plan: Home in several days   Consultants:   None   Procedures:   None  Antimicrobials:   Vancomycin and Aztreonam 9/20 -->  Subjective: Reports feeling better.   Objective: Vitals:   11/01/15 0805 11/01/15 0902 11/01/15 1349 11/01/15 1458  BP: (!) 136/57 (!) 157/71 (!) 153/66 (!) 149/74  Pulse: (!) 59  73 67  Resp: (!) 22  20 (!) 22  Temp: 98.8 F (37.1 C)  99.1 F (37.3 C) 98.5 F (36.9 C)  TempSrc:   Oral Oral  SpO2: 97%  97% 96%  Weight:      Height:        Intake/Output Summary (Last 24 hours) at 11/01/15  1826 Last data filed at 11/01/15 1448  Gross per 24 hour  Intake           541.67 ml  Output                1 ml  Net           540.67 ml   Filed Weights   10/31/15 1757 11/01/15 0208  Weight: 78 kg (172 lb) 77.7 kg (171 lb 3.2 oz)    Examination:  General exam: Appears calm and comfortable  Respiratory system: Respiratory effort normal. Diminished breath sounds at bases Cardiovascular system: S1 & S2 heard, RRR. No JVD, rubs, gallops or clicks. No pedal edema. Gastrointestinal system: Abdomen is nondistended, soft and nontender. No organomegaly or masses felt. N Central nervous system: Alert and oriented. No focal neurological deficits. Extremities: Symmetric 5 x 5 power.  Data Reviewed: I have personally reviewed following labs and imaging studies  CBC:  Recent Labs Lab 10/31/15 2009 11/01/15 0518  WBC 17.7* 10.6*  NEUTROABS 16.1* 8.2*  HGB 9.5* 9.0*  HCT 32.4* 31.1*  MCV 71.1* 71.8*  PLT 400 505   Basic Metabolic Panel:  Recent Labs Lab 10/31/15 2009 11/01/15 0518  NA 135 139  K 3.4* 3.3*  CL 105 110  CO2 20* 24  GLUCOSE 131* 106*  BUN 13  10  CREATININE 0.72 0.60  CALCIUM 8.5* 8.2*  MG  --  2.0   Liver Function Tests:  Recent Labs Lab 10/31/15 2009 11/01/15 0518  AST 15 13*  ALT 11* 10*  ALKPHOS 77 64  BILITOT 0.9 0.5  PROT 7.0 6.4*  ALBUMIN 3.8 3.2*   Urine analysis:    Component Value Date/Time   COLORURINE YELLOW 10/31/2015 2202   APPEARANCEUR CLOUDY (A) 10/31/2015 2202   LABSPEC 1.011 10/31/2015 2202   PHURINE 6.0 10/31/2015 2202   GLUCOSEU NEGATIVE 10/31/2015 2202   HGBUR TRACE (A) 10/31/2015 2202   BILIRUBINUR NEGATIVE 10/31/2015 2202   KETONESUR NEGATIVE 10/31/2015 2202   PROTEINUR NEGATIVE 10/31/2015 2202   UROBILINOGEN 0.2 07/25/2014 1505   NITRITE NEGATIVE 10/31/2015 2202   LEUKOCYTESUR LARGE (A) 10/31/2015 2202   Recent Results (from the past 240 hour(s))  Blood Culture (routine x 2)     Status: None (Preliminary result)    Collection Time: 10/31/15  8:09 PM  Result Value Ref Range Status   Specimen Description BLOOD LEFT FOREARM  Final   Special Requests BOTTLES DRAWN AEROBIC AND ANAEROBIC 5CC  Final   Culture   Final    NO GROWTH < 24 HOURS Performed at [facility]    Report Status PENDING  Incomplete  Blood Culture (routine x 2)     Status: None (Preliminary result)   Collection Time: 10/31/15  8:40 PM  Result Value Ref Range Status   Specimen Description BLOOD RIGHT ANTECUBITAL  Final   Special Requests BOTTLES DRAWN AEROBIC AND ANAEROBIC 5CC  Final   Culture   Final    NO GROWTH < 24 HOURS Performed at [facility]    Report Status PENDING  Incomplete      Radiology Studies: Dg Chest 2 View  Result Date: 10/31/2015 CLINICAL DATA:  Acute onset of congested cough and fever. Left-sided chest pain and shortness of breath. Initial encounter. EXAM: CHEST  2 VIEW COMPARISON:  None. FINDINGS: The lungs are well-aerated. Mild vascular congestion is noted. Mild left basilar opacity may reflect atelectasis or possibly mild infection. There is no evidence of pleural effusion or pneumothorax. The heart is enlarged. A moderate to large hiatal hernia is noted. No acute osseous abnormalities are seen. IMPRESSION: 1. Mild vascular congestion and cardiomegaly. Mild left basilar airspace opacity may reflect atelectasis or possibly mild infection. 2. Moderate to large hiatal hernia noted. Electronically Signed   By: Garald Balding M.D.   On: 10/31/2015 18:38      Scheduled Meds: . amLODipine  5 mg Oral Daily  . aztreonam  1 g Intravenous Q8H  . cloNIDine  0.1 mg Oral BID  . enoxaparin (LOVENOX) injection  40 mg Subcutaneous Q24H  . ferrous sulfate  325 mg Oral BID WC  . sodium chloride flush  3 mL Intravenous Q12H  . vancomycin  750 mg Intravenous Q12H   Continuous Infusions:    LOS: 1 day    Time spent: 20 minutes    Faye Ramsay, MD [facility] Pager [phone]  If  7PM-7AM, please contact night-coverage [email or web address] Password [email or web address] 11/01/2015, 6:26 PM

## 2015-11-02 LAB — CBC
HEMATOCRIT: 33.4 % — AB (ref 36.0–46.0)
Hemoglobin: 9.8 g/dL — ABNORMAL LOW (ref 12.0–15.0)
MCH: 21.4 pg — AB (ref 26.0–34.0)
MCHC: 29.3 g/dL — AB (ref 30.0–36.0)
MCV: 72.8 fL — AB (ref 78.0–100.0)
Platelets: 381 10*3/uL (ref 150–400)
RBC: 4.59 MIL/uL (ref 3.87–5.11)
RDW: 29.1 % — AB (ref 11.5–15.5)
WBC: 5.2 10*3/uL (ref 4.0–10.5)

## 2015-11-02 LAB — URINE CULTURE

## 2015-11-02 LAB — BASIC METABOLIC PANEL
Anion gap: 7 (ref 5–15)
BUN: 10 mg/dL (ref 6–20)
CHLORIDE: 110 mmol/L (ref 101–111)
CO2: 22 mmol/L (ref 22–32)
Calcium: 8.4 mg/dL — ABNORMAL LOW (ref 8.9–10.3)
Creatinine, Ser: 0.57 mg/dL (ref 0.44–1.00)
GFR calc Af Amer: >60 mL/min (ref >60)
Glucose, Bld: 122 mg/dL — ABNORMAL HIGH (ref 65–99)
Potassium: 3.5 mmol/L (ref 3.5–5.1)
Sodium: 139 mmol/L (ref 135–145)

## 2015-11-02 MED ORDER — DOXYCYCLINE HYCLATE 100 MG PO TABS
100.0000 mg | ORAL_TABLET | Freq: Two times a day (BID) | ORAL | Status: DC
  Administered 2015-11-02 – 2015-11-03 (×3): 100 mg via ORAL
  Filled 2015-11-02 (×3): qty 1

## 2015-11-02 NOTE — Progress Notes (Addendum)
Patient ID: [name] [name], female   DOB: Nov 23, 1927, 82 y.o.   MRN: [id number]    PROGRESS NOTE  [name] [name]  [id number] DOB: Mar 10, 1927 DOA: 10/31/2015  PCP: Harle Battiest, MD   Brief Narrative:  82 y.o. female with HTN, benign tremors, anemia, presented to [facility] ED with main concern of several days duration of progressively worsening dyspnea, subjective fevers, chills, poor oral intake. Temp at home was 102 F. In ED, imaging studies notable for left lobe PNA. Pt started on vancomycin and aztreonam and TRH asked to admit for further evaluation and treatment.  Assessment & Plan:   Principal Problem:   Sepsis secondary to HCAP (healthcare-associated pneumonia), ? UTI - pt met criteria for sepsis with T 101.7F, WBC 17 K, source PNA and possibly UTI - pt clinically improving, Tmax 59F overnight, WBC trending down from 17K --> 10 K --> 5K - has been on vanc and aztreonam and since she is improving, will change to oral doxycycline today  - urine cx with multiple sp so will try to recollect  - provide antitussives as needed, flutter valve  Active Problems:   Essential hypertension, benign - reasonable inpatient control     Iron deficiency anemia - Hg 9.8 this AM, no signs of active bleeding - CBC In AM    Hypokalemia - supplemented   DVT prophylaxis: Lovenox SQ Code Status: DNR Family Communication: Patient and husband at bedside, daughter over the phone  Disposition Plan: Home in am most likely   Consultants:   None   Procedures:   None  Antimicrobials:   Vancomycin and Aztreonam 9/20 --> 9/22  Doxycycline 9/22 -->   Subjective: Reports feeling better.   Objective: Vitals:   11/01/15 1458 11/01/15 2226 11/02/15 0551 11/02/15 0929  BP: (!) 149/74 (!) 146/73 (!) 142/59 (!) 157/73  Pulse: 67 87 64 98  Resp: (!) '22 20 18 20  ' Temp: 98.5 F (36.9 C) 98.4 F (36.9 C) 98 F (36.7 C)   TempSrc: Oral Oral Oral   SpO2: 96% 95% 96% 97%  Weight:   77.3 kg (170 lb 6.7  oz)   Height:        Intake/Output Summary (Last 24 hours) at 11/02/15 1100 Last data filed at 11/02/15 0900  Gross per 24 hour  Intake              290 ml  Output              500 ml  Net             -210 ml   Filed Weights   10/31/15 1757 11/01/15 0208 11/02/15 0551  Weight: 78 kg (172 lb) 77.7 kg (171 lb 3.2 oz) 77.3 kg (170 lb 6.7 oz)    Examination:  General exam: Appears calm and comfortable  Respiratory system: Respiratory effort normal. Diminished breath sounds at bases Cardiovascular system: S1 & S2 heard, RRR. No JVD, rubs, gallops or clicks. No pedal edema. Gastrointestinal system: Abdomen is nondistended, soft and nontender. No organomegaly or masses felt. Central nervous system: Alert and oriented. No focal neurological deficits. Extremities: Symmetric 5 x 5 power.  Data Reviewed: I have personally reviewed following labs and imaging studies  CBC:  Recent Labs Lab 10/31/15 2009 11/01/15 0518 11/02/15 0436  WBC 17.7* 10.6* 5.2  NEUTROABS 16.1* 8.2*  --   HGB 9.5* 9.0* 9.8*  HCT 32.4* 31.1* 33.4*  MCV 71.1* 71.8* 72.8*  PLT 400 371 948   Basic Metabolic  Panel:  Recent Labs Lab 10/31/15 2009 11/01/15 0518 11/02/15 0436  NA 135 139 139  K 3.4* 3.3* 3.5  CL 105 110 110  CO2 20* 24 22  GLUCOSE 131* 106* 122*  BUN '13 10 10  ' CREATININE 0.72 0.60 0.57  CALCIUM 8.5* 8.2* 8.4*  MG  --  2.0  --    Liver Function Tests:  Recent Labs Lab 10/31/15 2009 11/01/15 0518  AST 15 13*  ALT 11* 10*  ALKPHOS 77 64  BILITOT 0.9 0.5  PROT 7.0 6.4*  ALBUMIN 3.8 3.2*   Urine analysis:    Component Value Date/Time   COLORURINE YELLOW 10/31/2015 2202   APPEARANCEUR CLOUDY (A) 10/31/2015 2202   LABSPEC 1.011 10/31/2015 2202   PHURINE 6.0 10/31/2015 2202   GLUCOSEU NEGATIVE 10/31/2015 2202   HGBUR TRACE (A) 10/31/2015 2202   BILIRUBINUR NEGATIVE 10/31/2015 2202   KETONESUR NEGATIVE 10/31/2015 2202   PROTEINUR NEGATIVE 10/31/2015 2202   UROBILINOGEN 0.2  07/25/2014 1505   NITRITE NEGATIVE 10/31/2015 2202   LEUKOCYTESUR LARGE (A) 10/31/2015 2202   Recent Results (from the past 240 hour(s))  Blood Culture (routine x 2)     Status: None (Preliminary result)   Collection Time: 10/31/15  8:09 PM  Result Value Ref Range Status   Specimen Description BLOOD LEFT FOREARM  Final   Special Requests BOTTLES DRAWN AEROBIC AND ANAEROBIC 5CC  Final   Culture   Final    NO GROWTH < 24 HOURS Performed at [facility]    Report Status PENDING  Incomplete  Blood Culture (routine x 2)     Status: None (Preliminary result)   Collection Time: 10/31/15  8:40 PM  Result Value Ref Range Status   Specimen Description BLOOD RIGHT ANTECUBITAL  Final   Special Requests BOTTLES DRAWN AEROBIC AND ANAEROBIC 5CC  Final   Culture   Final    NO GROWTH < 24 HOURS Performed at [facility]    Report Status PENDING  Incomplete  Urine culture     Status: Abnormal   Collection Time: 10/31/15 10:02 PM  Result Value Ref Range Status   Specimen Description URINE, RANDOM  Final   Special Requests NONE  Final   Culture MULTIPLE SPECIES PRESENT, SUGGEST RECOLLECTION (A)  Final   Report Status 11/02/2015 FINAL  Final      Radiology Studies: Dg Chest 2 View  Result Date: 10/31/2015 CLINICAL DATA:  Acute onset of congested cough and fever. Left-sided chest pain and shortness of breath. Initial encounter. EXAM: CHEST  2 VIEW COMPARISON:  None. FINDINGS: The lungs are well-aerated. Mild vascular congestion is noted. Mild left basilar opacity may reflect atelectasis or possibly mild infection. There is no evidence of pleural effusion or pneumothorax. The heart is enlarged. A moderate to large hiatal hernia is noted. No acute osseous abnormalities are seen. IMPRESSION: 1. Mild vascular congestion and cardiomegaly. Mild left basilar airspace opacity may reflect atelectasis or possibly mild infection. 2. Moderate to large hiatal hernia noted. Electronically Signed    By: Garald Balding M.D.   On: 10/31/2015 18:38      Scheduled Meds: . amLODipine  5 mg Oral Daily  . aztreonam  1 g Intravenous Q8H  . cloNIDine  0.1 mg Oral BID  . enoxaparin (LOVENOX) injection  40 mg Subcutaneous Q24H  . ferrous sulfate  325 mg Oral BID WC  . sodium chloride flush  3 mL Intravenous Q12H  . vancomycin  750 mg Intravenous Q12H  Continuous Infusions:    LOS: 2 days    Time spent: 20 minutes    Faye Ramsay, MD [facility] Pager [phone]  If 7PM-7AM, please contact night-coverage [email or web address] Password [email or web address] 11/02/2015, 11:00 AM

## 2015-11-02 NOTE — Evaluation (Signed)
Physical Therapy Evaluation Patient Details Name: [name] [name] MRN: [id number] DOB: 1927-03-20 Today's Date: 11/02/2015   History of Present Illness  82 y.o. female admitted with PNA  Clinical Impression  Pt ambulated 200' without an assistive device, no loss of balance. SaO2 99% on RA, HR 134 with walking. Rollator recommended for long distance community ambulation. Pt is ready to DC home from PT standpoint. No further PT indicated as pt is independent with mobility, PT signing off.     Follow Up Recommendations No PT follow up    Equipment Recommendations  Other (comment) (rollator (4 wheeled RW) )    Recommendations for Other Services       Precautions / Restrictions Precautions Precautions: None Restrictions Weight Bearing Restrictions: No      Mobility  Bed Mobility Overal bed mobility: Modified Independent             General bed mobility comments: HOB up 30*  Transfers Overall transfer level: Independent Equipment used: None                Ambulation/Gait Ambulation/Gait assistance: Independent Ambulation Distance (Feet): 200 Feet Assistive device: None Gait Pattern/deviations: WFL(Within Functional Limits)   Gait velocity interpretation: at or above normal speed for age/gender General Gait Details: no LOB, 2/4 dyspnea, SaO2 99% on RA, HR 134 walking, distance limited by fatigue  Stairs            Wheelchair Mobility    Modified Rankin (Stroke Patients Only)       Balance Overall balance assessment: Independent                                           Pertinent Vitals/Pain Pain Assessment: No/denies pain    Home Living Family/patient expects to be discharged to:: Private residence Living Arrangements: Spouse/significant other Available Help at Discharge: Family;Available 24 hours/day Type of Home: House       Home Layout: Two level        Prior Function Level of Independence: Independent                Hand Dominance        Extremity/Trunk Assessment   Upper Extremity Assessment: Overall WFL for tasks assessed           Lower Extremity Assessment: Overall WFL for tasks assessed         Communication   Communication: No difficulties  Cognition Arousal/Alertness: Awake/alert Behavior During Therapy: WFL for tasks assessed/performed Overall Cognitive Status: Within Functional Limits for tasks assessed                      General Comments      Exercises     Assessment/Plan    PT Assessment Patent does not need any further PT services  PT Problem List            PT Treatment Interventions      PT Goals (Current goals can be found in the Care Plan section)  Acute Rehab PT Goals Patient Stated Goal: to go home PT Goal Formulation: All assessment and education complete, DC therapy    Frequency     Barriers to discharge        Co-evaluation               End of Session Equipment Utilized During Treatment: Gait belt Activity  Tolerance: Patient tolerated treatment well Patient left: in chair;with call bell/phone within reach;with family/visitor present Nurse Communication: Mobility status         Time: [id number] PT Time Calculation (min) (ACUTE ONLY): 21 min   Charges:   PT Evaluation $PT Eval Low Complexity: 1 Procedure     PT G Codes:        [name] [name] 11/02/2015, 1:23 PM [phone]

## 2015-11-03 DIAGNOSIS — A419 Sepsis, unspecified organism: Principal | ICD-10-CM

## 2015-11-03 DIAGNOSIS — I10 Essential (primary) hypertension: Secondary | ICD-10-CM

## 2015-11-03 DIAGNOSIS — J189 Pneumonia, unspecified organism: Secondary | ICD-10-CM

## 2015-11-03 LAB — BASIC METABOLIC PANEL
ANION GAP: 7 (ref 5–15)
BUN: 10 mg/dL (ref 6–20)
CHLORIDE: 109 mmol/L (ref 101–111)
CO2: 24 mmol/L (ref 22–32)
Calcium: 8.9 mg/dL (ref 8.9–10.3)
Creatinine, Ser: 0.64 mg/dL (ref 0.44–1.00)
GFR calc non Af Amer: >60 mL/min (ref >60)
Glucose, Bld: 107 mg/dL — ABNORMAL HIGH (ref 65–99)
POTASSIUM: 4.4 mmol/L (ref 3.5–5.1)
SODIUM: 140 mmol/L (ref 135–145)

## 2015-11-03 LAB — CBC
HCT: 34.9 % — ABNORMAL LOW (ref 36.0–46.0)
Hemoglobin: 10.1 g/dL — ABNORMAL LOW (ref 12.0–15.0)
MCH: 20.8 pg — ABNORMAL LOW (ref 26.0–34.0)
MCHC: 28.9 g/dL — ABNORMAL LOW (ref 30.0–36.0)
MCV: 71.8 fL — ABNORMAL LOW (ref 78.0–100.0)
Platelets: 402 10*3/uL — ABNORMAL HIGH (ref 150–400)
RBC: 4.86 MIL/uL (ref 3.87–5.11)
RDW: 29.4 % — ABNORMAL HIGH (ref 11.5–15.5)
WBC: 4.8 10*3/uL (ref 4.0–10.5)

## 2015-11-03 LAB — URINE CULTURE: CULTURE: NO GROWTH

## 2015-11-03 MED ORDER — DOXYCYCLINE HYCLATE 100 MG PO TABS
100.0000 mg | ORAL_TABLET | Freq: Two times a day (BID) | ORAL | 0 refills | Status: DC
Start: 2015-11-03 — End: 2016-02-14

## 2015-11-03 NOTE — Progress Notes (Signed)
Discharge instructions reviewed with patient utilizing teach back method no questions at this time. Patient discharged to home. ?

## 2015-11-03 NOTE — Progress Notes (Signed)
Care Management Consult : Physical Therapy evaluation stating patient didn't require any services at home.  Spoke to husband and patient verifying there was no co pay for equipment.   Ordered rolling walker with seat from [employer] . No other CM needs at this time

## 2015-11-03 NOTE — Discharge Summary (Signed)
Physician Discharge Summary  [name] [name]:[id number] DOB: [birth year] DOA: 10/31/2015  PCP: Harle Battiest, MD  Admit date: 10/31/2015 Discharge date: 11/03/2015  Time spent: >45 minutes  Recommendations for Outpatient Follow-up:  Recommended to f/u with PCP in 7-10 days   Discharge Diagnoses:  Principal Problem:   HCAP (healthcare-associated pneumonia) Active Problems:   Essential hypertension, benign   Iron deficiency anemia   Discharge Condition: stable   Diet recommendation: low sodium   Filed Weights   11/01/15 0208 11/02/15 0551 11/03/15 0500  Weight: 77.7 kg (171 lb 3.2 oz) 77.3 kg (170 lb 6.7 oz) 75.8 kg (167 lb 3.2 oz)    History of present illness:  82 y.o.femalewith HTN, benign tremors, anemia, presented to [facility] ED with main concern of several days duration of progressively worsening dyspnea, subjective fevers, chills, poor oral intake. Temp at home was 102 F. In ED, imaging studies notable for left lobe PNA. Pt started on vancomycin and aztreonam and TRH asked to admit for further evaluation and treatment.    Hospital Course:   Sepsis secondary to HCAP (healthcare-associated pneumonia), ? UTI.  pt met criteria for sepsis with T 101.17F, WBC 17 K, source PNA and possibly UTI -sepsis resolved with antibiotic treatment, she remained hemodynamically stable, WBC trended down from 17K --> 10 K --> 5K. Blood cultures: NGTD -patient received vanc and aztreonam then transitioned to oral doxycycline to complete the treatment course    Essential hypertension, benign.  reasonable inpatient control     Iron deficiency anemia.  Hg 9.8 this AM, no signs of active bleeding   Procedures:  none (i.e. Studies not automatically included, echos, thoracentesis, etc; not x-rays)  Consultations:  none  Discharge Exam: Vitals:   11/03/15 0639 11/03/15 1001  BP: (!) 139/57 130/76  Pulse: 86 (!) 103  Resp: 20 18  Temp: 98.2 F (36.8 C) 98.6 F (37 C)    General: no  distress Cardiovascular: s1,s2 rrr Respiratory: CTA BL  Discharge Instructions  Discharge Instructions    Call MD for:  temperature >100.4    Complete by:  As directed    Diet - low sodium heart healthy    Complete by:  As directed    Discharge instructions    Complete by:  As directed    Please follow up with primary care doctor in 7-10 days   Increase activity slowly    Complete by:  As directed        Medication List    STOP taking these medications   ciprofloxacin 500 MG tablet Commonly known as:  CIPRO     TAKE these medications   amLODipine 10 MG tablet Commonly known as:  NORVASC Take 1 tablet (10 mg total) by mouth daily.   cloNIDine 0.2 MG tablet Commonly known as:  CATAPRES Pt takes 1 tablet in the morning and 1.5 mg at bedtime   doxycycline 100 MG tablet Commonly known as:  VIBRA-TABS Take 1 tablet (100 mg total) by mouth every 12 (twelve) hours.   ferrous sulfate 325 (65 FE) MG tablet Take 1 tablet (325 mg total) by mouth 2 (two) times daily with a meal.   metoprolol tartrate 25 MG tablet Commonly known as:  LOPRESSOR Take 0.5 tablets (12.5 mg total) by mouth 2 (two) times daily.   polyethylene glycol packet Commonly known as:  MIRALAX / GLYCOLAX Take 17 g by mouth daily as needed for mild constipation.   spironolactone 50 MG tablet Commonly known as:  ALDACTONE Take 1  tablet (50 mg total) by mouth daily.      Allergies  Allergen Reactions  . Aspirin Hives  . Codeine Other (See Comments)    REACTION: GI upset  . Hydrochlorothiazide Rash  . Penicillins Hives, Shortness Of Breath and Rash    Has patient had a PCN reaction causing immediate rash, facial/tongue/throat swelling, SOB or lightheadedness with hypotension: yes Has patient had a PCN reaction causing severe rash involving mucus membranes or skin necrosis: unknown Has patient had a PCN reaction that required hospitalization : unknown Has patient had a PCN reaction occurring within the  last 10 years: no If all of the above answers are "NO", then may proceed with Cephalosporin use.   . Sulfonamide Derivatives Rash  . Levofloxacin Other (See Comments)    Joint aches and weakness  . Macrodantin [Nitrofurantoin] Other (See Comments)    Unknown reaction      The results of significant diagnostics from this hospitalization (including imaging, microbiology, ancillary and laboratory) are listed below for reference.    Significant Diagnostic Studies: Dg Chest 2 View  Result Date: 10/31/2015 CLINICAL DATA:  Acute onset of congested cough and fever. Left-sided chest pain and shortness of breath. Initial encounter. EXAM: CHEST  2 VIEW COMPARISON:  None. FINDINGS: The lungs are well-aerated. Mild vascular congestion is noted. Mild left basilar opacity may reflect atelectasis or possibly mild infection. There is no evidence of pleural effusion or pneumothorax. The heart is enlarged. A moderate to large hiatal hernia is noted. No acute osseous abnormalities are seen. IMPRESSION: 1. Mild vascular congestion and cardiomegaly. Mild left basilar airspace opacity may reflect atelectasis or possibly mild infection. 2. Moderate to large hiatal hernia noted. Electronically Signed   By: Garald Balding M.D.   On: 10/31/2015 18:38   Dg Chest 2 View  Result Date: 10/19/2015 CLINICAL DATA:  Anemia EXAM: CHEST  2 VIEW COMPARISON:  None. FINDINGS: Cardiomegaly. Moderate-sized hiatal hernia. No confluent airspace opacities or effusions. No edema. Mild hyperinflation. No acute bony abnormality. IMPRESSION: Moderate-sized hiatal hernia. Cardiomegaly, mild hyperinflation. No active disease. Electronically Signed   By: Rolm Baptise M.D.   On: 10/19/2015 16:29    Microbiology: Recent Results (from the past 240 hour(s))  Blood Culture (routine x 2)     Status: None (Preliminary result)   Collection Time: 10/31/15  8:09 PM  Result Value Ref Range Status   Specimen Description BLOOD LEFT FOREARM  Final    Special Requests BOTTLES DRAWN AEROBIC AND ANAEROBIC 5CC  Final   Culture   Final    NO GROWTH 2 DAYS Performed at [facility]    Report Status PENDING  Incomplete  Blood Culture (routine x 2)     Status: None (Preliminary result)   Collection Time: 10/31/15  8:40 PM  Result Value Ref Range Status   Specimen Description BLOOD RIGHT ANTECUBITAL  Final   Special Requests BOTTLES DRAWN AEROBIC AND ANAEROBIC 5CC  Final   Culture   Final    NO GROWTH 2 DAYS Performed at [facility]    Report Status PENDING  Incomplete  Urine culture     Status: Abnormal   Collection Time: 10/31/15 10:02 PM  Result Value Ref Range Status   Specimen Description URINE, RANDOM  Final   Special Requests NONE  Final   Culture MULTIPLE SPECIES PRESENT, SUGGEST RECOLLECTION (A)  Final   Report Status 11/02/2015 FINAL  Final  Culture, Urine     Status: None   Collection Time:  11/02/15 10:43 AM  Result Value Ref Range Status   Specimen Description URINE, RANDOM  Final   Special Requests NONE  Final   Culture NO GROWTH Performed at [facility]   Final   Report Status 11/03/2015 FINAL  Final     Labs: Basic Metabolic Panel:  Recent Labs Lab 10/31/15 2009 11/01/15 0518 11/02/15 0436 11/03/15 0522  NA 135 139 139 140  K 3.4* 3.3* 3.5 4.4  CL 105 110 110 109  CO2 20* _0 GLUCOSE 131* 106* 122* 107*  BUN _1 CREATININE 0.72 0.60 0.57 0.64  CALCIUM 8.5* 8.2* 8.4* 8.9  MG  --  2.0  --   --    Liver Function Tests:  Recent Labs Lab 10/31/15 2009 11/01/15 0518  AST 15 13*  ALT 11* 10*  ALKPHOS 77 64  BILITOT 0.9 0.5  PROT 7.0 6.4*  ALBUMIN 3.8 3.2*   No results for input(s): LIPASE, AMYLASE in the last 168 hours. No results for input(s): AMMONIA in the last 168 hours. CBC:  Recent Labs Lab 10/31/15 2009 11/01/15 0518 11/02/15 0436 11/03/15 0522  WBC 17.7* 10.6* 5.2 4.8  NEUTROABS 16.1* 8.2*  --   --   HGB 9.5* 9.0* 9.8* 10.1*  HCT 32.4*  31.1* 33.4* 34.9*  MCV 71.1* 71.8* 72.8* 71.8*  PLT 400 371 381 402*   Cardiac Enzymes: No results for input(s): CKTOTAL, CKMB, CKMBINDEX, TROPONINI in the last 168 hours. BNP: BNP (last 3 results) No results for input(s): BNP in the last 8760 hours.  ProBNP (last 3 results) No results for input(s): PROBNP in the last 8760 hours.  CBG: No results for input(s): GLUCAP in the last 168 hours.     SignedKinnie Feil  [facility] 11/03/2015, 12:00 PM

## 2015-11-05 LAB — CULTURE, BLOOD (ROUTINE X 2)
CULTURE: NO GROWTH
Culture: NO GROWTH

## 2015-11-09 DIAGNOSIS — N8111 Cystocele, midline: Secondary | ICD-10-CM | POA: Diagnosis not present

## 2015-11-09 DIAGNOSIS — N816 Rectocele: Secondary | ICD-10-CM | POA: Diagnosis not present

## 2015-11-12 DIAGNOSIS — J189 Pneumonia, unspecified organism: Secondary | ICD-10-CM | POA: Diagnosis not present

## 2015-11-12 DIAGNOSIS — R29890 Loss of height: Secondary | ICD-10-CM | POA: Diagnosis not present

## 2015-11-12 DIAGNOSIS — D649 Anemia, unspecified: Secondary | ICD-10-CM | POA: Diagnosis not present

## 2015-11-12 DIAGNOSIS — R634 Abnormal weight loss: Secondary | ICD-10-CM | POA: Diagnosis not present

## 2016-01-21 DIAGNOSIS — I10 Essential (primary) hypertension: Secondary | ICD-10-CM | POA: Diagnosis not present

## 2016-01-21 DIAGNOSIS — Z136 Encounter for screening for cardiovascular disorders: Secondary | ICD-10-CM | POA: Diagnosis not present

## 2016-01-21 DIAGNOSIS — D649 Anemia, unspecified: Secondary | ICD-10-CM | POA: Diagnosis not present

## 2016-01-21 DIAGNOSIS — D473 Essential (hemorrhagic) thrombocythemia: Secondary | ICD-10-CM | POA: Diagnosis not present

## 2016-01-21 DIAGNOSIS — Z0000 Encounter for general adult medical examination without abnormal findings: Secondary | ICD-10-CM | POA: Diagnosis not present

## 2016-02-13 NOTE — Progress Notes (Signed)
HPI The patient presents for follow up of HTN.    At the last visit she had a chronic anemia.  Since I last saw her she feels better. Her gait which was an issue and fatigue which were issues when she was profoundly anemic is improved. Her blood pressures are well controlled. She has had some bradycardia but she's not symptomatic with this. She's not having any presyncope or syncope. She denies any chest pressure, neck or arm discomfort. She's had no weight gain or edema. She gets around slowly but she gets around to the degree that she thinks is appropriate for her age.   Allergies  Allergen Reactions  . Aspirin Hives  . Codeine Other (See Comments)    REACTION: GI upset  . Hydrochlorothiazide Rash  . Penicillins Hives, Shortness Of Breath and Rash    Has patient had a PCN reaction causing immediate rash, facial/tongue/throat swelling, SOB or lightheadedness with hypotension: yes Has patient had a PCN reaction causing severe rash involving mucus membranes or skin necrosis: unknown Has patient had a PCN reaction that required hospitalization : unknown Has patient had a PCN reaction occurring within the last 10 years: no If all of the above answers are "NO", then may proceed with Cephalosporin use.   . Sulfonamide Derivatives Rash  . Levofloxacin Other (See Comments)    Joint aches and weakness  . Macrodantin [Nitrofurantoin] Other (See Comments)    Unknown reaction    Current Outpatient Prescriptions  Medication Sig Dispense Refill  . amLODipine (NORVASC) 10 MG tablet Take 1 tablet (10 mg total) by mouth daily. 90 tablet 3  . cloNIDine (CATAPRES) 0.2 MG tablet Pt takes 1 tablet in the morning and 1.5 mg at bedtime 225 tablet 3  . FERREX 150 150 MG capsule Take 150 mg by mouth daily.  3  . ferrous sulfate 325 (65 FE) MG tablet Take 1 tablet (325 mg total) by mouth 2 (two) times daily with a meal. 60 tablet 0  . metoprolol tartrate (LOPRESSOR) 25 MG tablet Take 0.5 tablets (12.5 mg  total) by mouth 2 (two) times daily. 90 tablet 2  . polyethylene glycol (MIRALAX / GLYCOLAX) packet Take 17 g by mouth daily as needed for mild constipation. 30 each 0  . spironolactone (ALDACTONE) 50 MG tablet Take 1 tablet (50 mg total) by mouth daily. 90 tablet 2   No current facility-administered medications for this visit.     Past Medical History:  Diagnosis Date  . Atrial tachycardia ([place])   . HTN (hypertension)   . Jaundice due to hepatitis    at age 59 or 86  . Transient global amnesia    AMS  . Tremors of nervous system    benign familial    Past Surgical History:  Procedure Laterality Date  . ABDOMINAL HERNIA REPAIR    . BACK SURGERY     years ago  . TONSILLECTOMY     ROS:   As stated in the HPI and negative for all other systems.  PHYSICAL EXAM BP (!) 152/74 (BP Location: Right Arm, Patient Position: Sitting, Cuff Size: Normal)   Pulse 74   Ht 5\' 2"  (1.575 m)   Wt 170 lb 8 oz (77.3 kg)   BMI 31.18 kg/m  GENERAL:  Well appearing NECK:  No jugular venous distention, waveform within normal limits, carotid upstroke brisk and symmetric, no bruits, no thyromegaly LUNGS:  Clear to auscultation bilaterally CHEST:  Unremarkable HEART:  PMI not displaced or sustained,S1  and S2 within normal limits, no S3, no S4, no clicks, no rubs, no murmurs ABD:  Flat, positive bowel sounds normal in frequency in pitch, no bruits, no rebound, no guarding, no midline pulsatile mass, no hepatomegaly, no splenomegaly EXT:  2 plus pulses throughout, right greater than left non pitting swelling, no cyanosis no clubbing NEURO:  Tremor mild   ASSESSMENT AND PLAN   ESSENTIAL HYPERTENSION, BENIGN -  Her BP is controlled.  She will continue the meds as listed.  She does have bradycardia but is not symptomatic. No change in therapy is planned.   FATIGUE -  This is improved.  No change in therapy.

## 2016-02-14 ENCOUNTER — Encounter: Payer: Self-pay | Admitting: Cardiology

## 2016-02-14 ENCOUNTER — Ambulatory Visit (INDEPENDENT_AMBULATORY_CARE_PROVIDER_SITE_OTHER): Payer: MEDICARE | Admitting: Cardiology

## 2016-02-14 VITALS — BP 152/74 | HR 74 | Ht 62.0 in | Wt 170.5 lb

## 2016-02-14 DIAGNOSIS — I10 Essential (primary) hypertension: Secondary | ICD-10-CM

## 2016-02-14 NOTE — Patient Instructions (Signed)
Medication Instructions:  Continue current medications  Labwork: None Ordered  Testing/Procedures: None Ordered  Follow-Up: Your physician wants you to follow-up in: 6 months. You will receive a reminder letter in the mail two months in advance. If you don't receive a letter, please call our office to schedule the follow-up appointment.   Any Other Special Instructions Will Be Listed Below (If Applicable).   If you need a refill on your cardiac medications before your next appointment, please call your pharmacy.

## 2016-02-15 ENCOUNTER — Encounter: Payer: Self-pay | Admitting: Cardiology

## 2016-02-26 DIAGNOSIS — D649 Anemia, unspecified: Secondary | ICD-10-CM | POA: Diagnosis not present

## 2016-03-13 ENCOUNTER — Telehealth: Payer: Self-pay | Admitting: Hematology

## 2016-03-13 ENCOUNTER — Ambulatory Visit (HOSPITAL_BASED_OUTPATIENT_CLINIC_OR_DEPARTMENT_OTHER): Payer: MEDICARE

## 2016-03-13 ENCOUNTER — Encounter: Payer: Self-pay | Admitting: Hematology

## 2016-03-13 ENCOUNTER — Ambulatory Visit (HOSPITAL_BASED_OUTPATIENT_CLINIC_OR_DEPARTMENT_OTHER): Payer: MEDICARE | Admitting: Hematology

## 2016-03-13 VITALS — BP 170/51 | HR 59 | Temp 98.0°F | Resp 18 | Ht 62.0 in | Wt 169.1 lb

## 2016-03-13 DIAGNOSIS — D75839 Thrombocytosis, unspecified: Secondary | ICD-10-CM

## 2016-03-13 DIAGNOSIS — D509 Iron deficiency anemia, unspecified: Secondary | ICD-10-CM

## 2016-03-13 DIAGNOSIS — K449 Diaphragmatic hernia without obstruction or gangrene: Secondary | ICD-10-CM

## 2016-03-13 DIAGNOSIS — E611 Iron deficiency: Secondary | ICD-10-CM

## 2016-03-13 DIAGNOSIS — Z801 Family history of malignant neoplasm of trachea, bronchus and lung: Secondary | ICD-10-CM

## 2016-03-13 DIAGNOSIS — E538 Deficiency of other specified B group vitamins: Secondary | ICD-10-CM

## 2016-03-13 DIAGNOSIS — D473 Essential (hemorrhagic) thrombocythemia: Secondary | ICD-10-CM

## 2016-03-13 LAB — CBC & DIFF AND RETIC
BASO%: 0.5 % (ref 0.0–2.0)
BASOS ABS: 0 10*3/uL (ref 0.0–0.1)
EOS%: 2 % (ref 0.0–7.0)
Eosinophils Absolute: 0.1 10*3/uL (ref 0.0–0.5)
HEMATOCRIT: 31.4 % — AB (ref 34.8–46.6)
HGB: 9.8 g/dL — ABNORMAL LOW (ref 11.6–15.9)
Immature Retic Fract: 13.8 % — ABNORMAL HIGH (ref 1.60–10.00)
LYMPH%: 27.4 % (ref 14.0–49.7)
MCH: 20.3 pg — AB (ref 25.1–34.0)
MCHC: 31.2 g/dL — AB (ref 31.5–36.0)
MCV: 65 fL — ABNORMAL LOW (ref 79.5–101.0)
MONO#: 0.8 10*3/uL (ref 0.1–0.9)
MONO%: 13.1 % (ref 0.0–14.0)
NEUT#: 3.7 10*3/uL (ref 1.5–6.5)
NEUT%: 57 % (ref 38.4–76.8)
PLATELETS: 433 10*3/uL — AB (ref 145–400)
RBC: 4.83 10*6/uL (ref 3.70–5.45)
RDW: 16.7 % — ABNORMAL HIGH (ref 11.2–14.5)
RETIC %: 1.42 % (ref 0.70–2.10)
Retic Ct Abs: 68.59 10*3/uL (ref 33.70–90.70)
WBC: 6.4 10*3/uL (ref 3.9–10.3)
lymph#: 1.8 10*3/uL (ref 0.9–3.3)

## 2016-03-13 LAB — COMPREHENSIVE METABOLIC PANEL
ALT: 11 U/L (ref 0–55)
ANION GAP: 10 meq/L (ref 3–11)
AST: 13 U/L (ref 5–34)
Albumin: 4.1 g/dL (ref 3.5–5.0)
Alkaline Phosphatase: 130 U/L (ref 40–150)
BUN: 14.7 mg/dL (ref 7.0–26.0)
CALCIUM: 9.5 mg/dL (ref 8.4–10.4)
CHLORIDE: 106 meq/L (ref 98–109)
CO2: 23 mEq/L (ref 22–29)
Creatinine: 0.8 mg/dL (ref 0.6–1.1)
EGFR: 69 mL/min/{1.73_m2} — ABNORMAL LOW (ref >90)
Glucose: 94 mg/dl (ref 70–140)
POTASSIUM: 4.2 meq/L (ref 3.5–5.1)
Sodium: 139 mEq/L (ref 136–145)
Total Bilirubin: 0.3 mg/dL (ref 0.20–1.20)
Total Protein: 7.9 g/dL (ref 6.4–8.3)

## 2016-03-13 LAB — TECHNOLOGIST REVIEW

## 2016-03-13 MED ORDER — B COMPLEX VITAMINS PO CAPS: 1.0000 | ORAL_CAPSULE | Freq: Every day | ORAL | 2 refills | Status: DC

## 2016-03-13 MED ORDER — B-12 1000 MCG SL SUBL: 1000.0000 ug | SUBLINGUAL_TABLET | Freq: Every day | SUBLINGUAL | 3 refills | Status: DC

## 2016-03-13 NOTE — Telephone Encounter (Signed)
Gave patient avs report and appointments for February and March.  °

## 2016-03-13 NOTE — Progress Notes (Signed)
Marland Kitchen    HEMATOLOGY/ONCOLOGY CONSULTATION NOTE  Date of Service: 03/13/2016  Patient Care Team: Lawerance Cruel, MD as PCP - General  CHIEF COMPLAINTS/PURPOSE OF CONSULTATION:  Iron deficiency Anemia and B12 deficiency  HISTORY OF PRESENTING ILLNESS:   [name] [name] is a wonderful 83 y.o. female who has been referred to [place] by Dr .Melinda Crutch, MD for evaluation and management of microcytic anemia.  Patient has a h/o HTN, atrial tachycardia, , benign tremor who was noted to have a new severe microcytic anemia and weakness with a drop of hgb down to 7.4 in sept 2017. She was admitted to the hospital and received 1 unit of PRBC. She was noted to have severe new iron deficiency and also noted to have B12 deficiency. Her stool occult blood was apparently neg. Was discharged on PO iron and was taking it for a few months. Rpt stool studies with PCP were again noted to be hemoccult neg. She received B12 IM x 1 in the hospital but has not been on any B12 since then. Patient had refused a GI workup in the hospital and was to be seen by Dr Fuller Plan for GI workup in the outpatient setting but has refused and continues to refuse and GI workup.  Denies any overt GI bleeding. No melena no hematemesis no hematochezia. No nausea/vomiting or abdominal pain. No significant weight loss recently.  Her hgb improved some on Po iron but then started dropping again and so she was referred to [place] for consideration of IV iron replacement.  On CXR in the hospital she was incidentally noted to have a moderate to large hiatal hernia.  MEDICAL HISTORY:  Past Medical History:  Diagnosis Date  . Atrial tachycardia ([place])   . HTN (hypertension)   . Jaundice due to hepatitis    at age 53 or 31  . Transient global amnesia    AMS  . Tremors of nervous system    benign familial  Large Hiatal hernia HCAP 10/2015 Iron deficiency Anemia B12 deficiency.  SURGICAL HISTORY: Past Surgical History:  Procedure Laterality Date  .  ABDOMINAL HERNIA REPAIR    . BACK SURGERY     years ago  . TONSILLECTOMY      SOCIAL HISTORY: Social History   Social History  . Marital status: Married    Spouse name: N/A  . Number of children: N/A  . Years of education: N/A   Occupational History  . Not on file.   Social History Main Topics  . Smoking status: Never Smoker  . Smokeless tobacco: Never Used     Comment: + prior 2nd hand exposure from spouse smoking  . Alcohol use No  . Drug use: Unknown  . Sexual activity: No   Other Topics Concern  . Not on file   Social History Narrative  . No narrative on file    FAMILY HISTORY: Family History  Problem Relation Age of Onset  . Lung cancer Mother   . Parkinson's disease Mother   . Hypertension Mother   . Other Father     blood clot  . Arthritis Sister   . Other Sister     spinal stenosis    ALLERGIES:  is allergic to aspirin; codeine; hydrochlorothiazide; penicillins; sulfonamide derivatives; levofloxacin; and macrodantin [nitrofurantoin].  MEDICATIONS:  Current Outpatient Prescriptions  Medication Sig Dispense Refill  . amLODipine (NORVASC) 10 MG tablet Take 1 tablet (10 mg total) by mouth daily. 90 tablet 3  . cloNIDine (CATAPRES) 0.2 MG  tablet Pt takes 1 tablet in the morning and 1.5 mg at bedtime 225 tablet 3  . metoprolol tartrate (LOPRESSOR) 25 MG tablet Take 0.5 tablets (12.5 mg total) by mouth 2 (two) times daily. 90 tablet 2  . polyethylene glycol (MIRALAX / GLYCOLAX) packet Take 17 g by mouth daily as needed for mild constipation. 30 each 0  . spironolactone (ALDACTONE) 50 MG tablet Take 1 tablet (50 mg total) by mouth daily. 90 tablet 2  . b complex vitamins capsule Take 1 capsule by mouth daily. 30 capsule 2  . Cyanocobalamin (B-12) 1000 MCG SUBL Place 1,000 mcg under the tongue daily. 30 each 3   No current facility-administered medications for this visit.     REVIEW OF SYSTEMS:    10 Point review of Systems was done is negative except as  noted above.  PHYSICAL EXAMINATION: ECOG PERFORMANCE STATUS: 2 - Symptomatic, <50% confined to bed  . Vitals:   03/13/16 1355  BP: (!) 170/51  Pulse: (!) 59  Resp: 18  Temp: 98 F (36.7 C)   Filed Weights   03/13/16 1355  Weight: 169 lb 1.6 oz (76.7 kg)   .Body mass index is 30.93 kg/m.  GENERAL:alert, in no acute distress and comfortable SKIN: skin color, texture, turgor are normal, no rashes or significant lesions EYES: normal, conjunctiva are pink and non-injected, sclera clear OROPHARYNX:no exudate, no erythema and lips, buccal mucosa, and tongue normal  NECK: supple, no JVD, thyroid normal size, non-tender, without nodularity LYMPH:  no palpable lymphadenopathy in the cervical, axillary or inguinal LUNGS: clear to auscultation with normal respiratory effort HEART: regular rate & rhythm,  no murmurs and no lower extremity edema ABDOMEN: abdomen soft, non-tender, normoactive bowel sounds  Musculoskeletal: no cyanosis of digits and no clubbing  PSYCH: alert & oriented x 3 with fluent speech NEURO: no focal motor/sensory deficits  LABORATORY DATA:  I have reviewed the data as listed  . CBC Latest Ref Rng & Units 03/13/2016 11/03/2015 11/02/2015  WBC 3.9 - 10.3 10e3/uL 6.4 4.8 5.2  Hemoglobin 11.6 - 15.9 g/dL 9.8(L) 10.1(L) 9.8(L)  Hematocrit 34.8 - 46.6 % 31.4(L) 34.9(L) 33.4(L)  Platelets 145 - 400 10e3/uL 433(H) 402(H) 381    . CMP Latest Ref Rng & Units 03/13/2016 11/03/2015 11/02/2015  Glucose 70 - 140 mg/dl 94 107(H) 122(H)  BUN 7.0 - 26.0 mg/dL 14.7 10 10   Creatinine 0.6 - 1.1 mg/dL 0.8 0.64 0.57  Sodium 136 - 145 mEq/L 139 140 139  Potassium 3.5 - 5.1 mEq/L 4.2 4.4 3.5  Chloride 101 - 111 mmol/L - 109 110  CO2 22 - 29 mEq/L 23 24 22   Calcium 8.4 - 10.4 mg/dL 9.5 8.9 8.4(L)  Total Protein 6.4 - 8.3 g/dL 7.9 - -  Total Bilirubin 0.20 - 1.20 mg/dL 0.30 - -  Alkaline Phos 40 - 150 U/L 130 - -  AST 5 - 34 U/L 13 - -  ALT 0 - 55 U/L 11 - -     RADIOGRAPHIC  STUDIES: I have personally reviewed the radiological images as listed and agreed with the findings in the report. No results found.  ASSESSMENT & PLAN:   83 yo caucasian female with   1) Severe Microcytic Anemia s/p PRBc transfusion in sept 2017.  This appears to be likely related to severe iron deficiency  2) Severe Iron deficiency No overt source of bleeding Statistically more likely to be due to slow ongoing GI losses ?HH with cameron ulcers vs AVMs vs  diverticular bleeding vs PUD vs tumors  Discussed this understanding with patient and she is definitively opposed to any idea of a GI workup  3) B12 deficiency PLAN -discussed all the available results and likely causes of the patient iron deficiency and anemia. -will recheck labs today -patient recommended and refused GI workup to determine etiology of iron deficiency. -will replace iron IV with IV injectafer 750mg  weekly x 2 doses (discussed pros vs cons of IV iron and patient is agreeable) -can hold po iron at this time -B12 [place] 102mcg x 2 doses with IV iron and then SL B12 1045mcg daily -vit B complex 1 tab po daily to support accelerated hematopoiesis. -will check anti parietal and anti IF antibodies to r/o pernicous anemia -no gluten intolerance noted clinically.  4) . Patient Active Problem List   Diagnosis Date Noted  . HCAP (healthcare-associated pneumonia) 10/31/2015  . Iron deficiency anemia 10/20/2015  . B12 deficiency anemia 10/20/2015  . Symptomatic anemia 10/19/2015  . UTI (urinary tract infection) 07/26/2014  . TGA (transient global amnesia) 07/25/2014  . Cardiac dysrhythmia 02/06/2010  . PALPITATIONS 02/06/2010  . CHICKENPOX, HX OF 09/07/2008  . Essential hypertension, benign 05/22/2008  . PSVT 03/27/2008  . OBESITY, UNSPECIFIED 03/26/2008  . Abnormal involuntary movement 03/26/2008  . AMNESIA, TRANSIENT GLOBAL 01/10/2007   -f/u with PCP for management of her other medical co-morbids   Labs today IV  Iron (Injectafer) qweekly x 2 doses and B12 [place] x 2 doses RTC with Dr Irene Limbo in 8 weeks with rpt labs  All of the patients questions were answered with apparent satisfaction. The patient knows to call the clinic with any problems, questions or concerns.  I spent 50 minutes counseling the patient face to face. The total time spent in the appointment was 60 minutes and more than 50% was on counseling and direct patient cares.    Sullivan Lone MD [place] AAHIVMS [facility] Hematology/Oncology Physician [facility]  (Office):       [phone] (Work cell):  [phone] (Fax):           [phone]  03/13/2016 1:56 PM

## 2016-03-14 LAB — IRON AND TIBC
%SAT: 2 % — AB (ref 21–57)
IRON: 13 ug/dL — AB (ref 41–142)
TIBC: 580 ug/dL — ABNORMAL HIGH (ref 236–444)
UIBC: 567 ug/dL — ABNORMAL HIGH (ref 120–384)

## 2016-03-14 LAB — FERRITIN: Ferritin: <4 ng/ml — ABNORMAL LOW (ref 9–269)

## 2016-03-17 LAB — MULTIPLE MYELOMA PANEL, SERUM
ALBUMIN SERPL ELPH-MCNC: 3.8 g/dL (ref 2.9–4.4)
ALBUMIN/GLOB SERPL: 1.1 (ref 0.7–1.7)
ALPHA 1: 0.3 g/dL (ref 0.0–0.4)
ALPHA2 GLOB SERPL ELPH-MCNC: 1 g/dL (ref 0.4–1.0)
B-GLOBULIN SERPL ELPH-MCNC: 1.4 g/dL — AB (ref 0.7–1.3)
GAMMA GLOB SERPL ELPH-MCNC: 1 g/dL (ref 0.4–1.8)
GLOBULIN, TOTAL: 3.7 g/dL (ref 2.2–3.9)
IGA/IMMUNOGLOBULIN A, SERUM: 354 mg/dL (ref 64–422)
IgM, Qn, Serum: 46 mg/dL (ref 26–217)
Total Protein: 7.5 g/dL (ref 6.0–8.5)

## 2016-03-21 ENCOUNTER — Ambulatory Visit (HOSPITAL_BASED_OUTPATIENT_CLINIC_OR_DEPARTMENT_OTHER): Payer: MEDICARE

## 2016-03-21 ENCOUNTER — Ambulatory Visit: Payer: MEDICARE

## 2016-03-21 VITALS — BP 118/84 | HR 50 | Temp 97.7°F | Resp 18

## 2016-03-21 DIAGNOSIS — D509 Iron deficiency anemia, unspecified: Secondary | ICD-10-CM | POA: Diagnosis present

## 2016-03-21 DIAGNOSIS — E538 Deficiency of other specified B group vitamins: Secondary | ICD-10-CM

## 2016-03-21 DIAGNOSIS — D500 Iron deficiency anemia secondary to blood loss (chronic): Secondary | ICD-10-CM

## 2016-03-21 MED ORDER — ACETAMINOPHEN 325 MG PO TABS
650.0000 mg | ORAL_TABLET | Freq: Once | ORAL | Status: AC
  Administered 2016-03-21: 650 mg via ORAL

## 2016-03-21 MED ORDER — SODIUM CHLORIDE 0.9 % IV SOLN
INTRAVENOUS | Status: DC
  Administered 2016-03-21: 12:00:00 via INTRAVENOUS

## 2016-03-21 MED ORDER — DIPHENHYDRAMINE HCL 25 MG PO TABS
25.0000 mg | ORAL_TABLET | Freq: Once | ORAL | Status: AC
  Administered 2016-03-21: 25 mg via ORAL
  Filled 2016-03-21: qty 1

## 2016-03-21 MED ORDER — ACETAMINOPHEN 325 MG PO TABS
ORAL_TABLET | ORAL | Status: AC
  Filled 2016-03-21: qty 2

## 2016-03-21 MED ORDER — CYANOCOBALAMIN 1000 MCG/ML IJ SOLN
1000.0000 ug | Freq: Once | INTRAMUSCULAR | Status: AC
  Administered 2016-03-21: 1000 ug via SUBCUTANEOUS

## 2016-03-21 MED ORDER — DIPHENHYDRAMINE HCL 25 MG PO CAPS
ORAL_CAPSULE | ORAL | Status: AC
  Filled 2016-03-21: qty 1

## 2016-03-21 MED ORDER — SODIUM CHLORIDE 0.9 % IV SOLN
750.0000 mg | Freq: Once | INTRAVENOUS | Status: AC
  Administered 2016-03-21: 750 mg via INTRAVENOUS
  Filled 2016-03-21: qty 15

## 2016-03-21 MED ORDER — CYANOCOBALAMIN 1000 MCG/ML IJ SOLN
INTRAMUSCULAR | Status: AC
  Filled 2016-03-21: qty 1

## 2016-03-21 NOTE — Patient Instructions (Signed)
Ferric carboxymaltose injection What is this medicine? FERRIC CARBOXYMALTOSE (ferr-ik car-box-ee-mol-toes) is an iron complex. Iron is used to make healthy red blood cells, which carry oxygen and nutrients throughout the body. This medicine is used to treat anemia in people with chronic kidney disease or people who cannot take iron by mouth. COMMON BRAND NAME(S): Injectafer What should I tell my health care provider before I take this medicine? They need to know if you have any of these conditions: -anemia not caused by low iron levels -high levels of iron in the blood -liver disease -an unusual or allergic reaction to iron, other medicines, foods, dyes, or preservatives -pregnant or trying to get pregnant -breast-feeding How should I use this medicine? This medicine is for infusion into a vein. It is given by a health care professional in a hospital or clinic setting. Talk to your pediatrician regarding the use of this medicine in children. Special care may be needed. What if I miss a dose? It is important not to miss your dose. Call your doctor or health care professional if you are unable to keep an appointment. What may interact with this medicine? Do not take this medicine with any of the following medications: -deferoxamine -dimercaprol -other iron products This medicine may also interact with the following medications: -chloramphenicol -deferasirox What should I watch for while using this medicine? Visit your doctor or health care professional regularly. Tell your doctor if your symptoms do not start to get better or if they get worse. You may need blood work done while you are taking this medicine. You may need to follow a special diet. Talk to your doctor. Foods that contain iron include: whole grains/cereals, dried fruits, beans, or peas, leafy green vegetables, and organ meats (liver, kidney). What side effects may I notice from receiving this medicine? Side effects that you  should report to your doctor or health care professional as soon as possible: -allergic reactions like skin rash, itching or hives, swelling of the face, lips, or tongue -breathing problems -changes in blood pressure -feeling faint or lightheaded, falls -flushing, sweating, or hot feelings Side effects that usually do not require medical attention (report to your doctor or health care professional if they continue or are bothersome): -changes in taste -constipation -dizziness -headache -nausea -pain, redness, or irritation at site where injected -vomiting Where should I keep my medicine? This drug is given in a hospital or clinic and will not be stored at home.  2017 Elsevier/Gold Standard (2015-03-01 11:20:47)  

## 2016-03-24 DIAGNOSIS — N8111 Cystocele, midline: Secondary | ICD-10-CM | POA: Diagnosis not present

## 2016-03-24 DIAGNOSIS — N816 Rectocele: Secondary | ICD-10-CM | POA: Diagnosis not present

## 2016-03-28 ENCOUNTER — Ambulatory Visit: Payer: MEDICARE

## 2016-03-28 ENCOUNTER — Ambulatory Visit (HOSPITAL_BASED_OUTPATIENT_CLINIC_OR_DEPARTMENT_OTHER): Payer: MEDICARE

## 2016-03-28 VITALS — BP 135/73 | HR 50 | Temp 97.7°F | Resp 20

## 2016-03-28 DIAGNOSIS — D538 Other specified nutritional anemias: Secondary | ICD-10-CM

## 2016-03-28 DIAGNOSIS — D509 Iron deficiency anemia, unspecified: Secondary | ICD-10-CM

## 2016-03-28 DIAGNOSIS — D500 Iron deficiency anemia secondary to blood loss (chronic): Secondary | ICD-10-CM

## 2016-03-28 MED ORDER — CYANOCOBALAMIN 1000 MCG/ML IJ SOLN
1000.0000 ug | Freq: Once | INTRAMUSCULAR | Status: AC
  Administered 2016-03-28: 1000 ug via SUBCUTANEOUS

## 2016-03-28 MED ORDER — ACETAMINOPHEN 325 MG PO TABS
ORAL_TABLET | ORAL | Status: AC
  Filled 2016-03-28: qty 2

## 2016-03-28 MED ORDER — DIPHENHYDRAMINE HCL 25 MG PO TABS
25.0000 mg | ORAL_TABLET | Freq: Once | ORAL | Status: AC
  Administered 2016-03-28: 25 mg via ORAL
  Filled 2016-03-28: qty 1

## 2016-03-28 MED ORDER — DIPHENHYDRAMINE HCL 25 MG PO CAPS
ORAL_CAPSULE | ORAL | Status: AC
  Filled 2016-03-28: qty 1

## 2016-03-28 MED ORDER — CYANOCOBALAMIN 1000 MCG/ML IJ SOLN
INTRAMUSCULAR | Status: AC
  Filled 2016-03-28: qty 1

## 2016-03-28 MED ORDER — ACETAMINOPHEN 325 MG PO TABS
650.0000 mg | ORAL_TABLET | Freq: Once | ORAL | Status: AC
  Administered 2016-03-28: 650 mg via ORAL

## 2016-03-28 MED ORDER — SODIUM CHLORIDE 0.9 % IV SOLN
750.0000 mg | Freq: Once | INTRAVENOUS | Status: AC
  Administered 2016-03-28: 750 mg via INTRAVENOUS
  Filled 2016-03-28: qty 15

## 2016-03-28 NOTE — Patient Instructions (Signed)
Ferric carboxymaltose injection What is this medicine? FERRIC CARBOXYMALTOSE (ferr-ik car-box-ee-mol-toes) is an iron complex. Iron is used to make healthy red blood cells, which carry oxygen and nutrients throughout the body. This medicine is used to treat anemia in people with chronic kidney disease or people who cannot take iron by mouth. COMMON BRAND NAME(S): Injectafer What should I tell my health care provider before I take this medicine? They need to know if you have any of these conditions: -anemia not caused by low iron levels -high levels of iron in the blood -liver disease -an unusual or allergic reaction to iron, other medicines, foods, dyes, or preservatives -pregnant or trying to get pregnant -breast-feeding How should I use this medicine? This medicine is for infusion into a vein. It is given by a health care professional in a hospital or clinic setting. Talk to your pediatrician regarding the use of this medicine in children. Special care may be needed. What if I miss a dose? It is important not to miss your dose. Call your doctor or health care professional if you are unable to keep an appointment. What may interact with this medicine? Do not take this medicine with any of the following medications: -deferoxamine -dimercaprol -other iron products This medicine may also interact with the following medications: -chloramphenicol -deferasirox What should I watch for while using this medicine? Visit your doctor or health care professional regularly. Tell your doctor if your symptoms do not start to get better or if they get worse. You may need blood work done while you are taking this medicine. You may need to follow a special diet. Talk to your doctor. Foods that contain iron include: whole grains/cereals, dried fruits, beans, or peas, leafy green vegetables, and organ meats (liver, kidney). What side effects may I notice from receiving this medicine? Side effects that you  should report to your doctor or health care professional as soon as possible: -allergic reactions like skin rash, itching or hives, swelling of the face, lips, or tongue -breathing problems -changes in blood pressure -feeling faint or lightheaded, falls -flushing, sweating, or hot feelings Side effects that usually do not require medical attention (report to your doctor or health care professional if they continue or are bothersome): -changes in taste -constipation -dizziness -headache -nausea -pain, redness, or irritation at site where injected -vomiting Where should I keep my medicine? This drug is given in a hospital or clinic and will not be stored at home.  2017 Elsevier/Gold Standard (2015-03-01 11:20:47)  

## 2016-03-28 NOTE — Progress Notes (Signed)
B 12 Injection given with infusion visit today.

## 2016-05-08 ENCOUNTER — Ambulatory Visit (HOSPITAL_BASED_OUTPATIENT_CLINIC_OR_DEPARTMENT_OTHER): Payer: MEDICARE | Admitting: Hematology

## 2016-05-08 ENCOUNTER — Other Ambulatory Visit (HOSPITAL_BASED_OUTPATIENT_CLINIC_OR_DEPARTMENT_OTHER): Payer: MEDICARE

## 2016-05-08 VITALS — BP 145/77 | HR 70 | Temp 97.9°F | Resp 18 | Ht 62.0 in | Wt 164.7 lb

## 2016-05-08 DIAGNOSIS — E611 Iron deficiency: Secondary | ICD-10-CM

## 2016-05-08 DIAGNOSIS — D500 Iron deficiency anemia secondary to blood loss (chronic): Secondary | ICD-10-CM

## 2016-05-08 DIAGNOSIS — E538 Deficiency of other specified B group vitamins: Secondary | ICD-10-CM

## 2016-05-08 DIAGNOSIS — D509 Iron deficiency anemia, unspecified: Secondary | ICD-10-CM | POA: Diagnosis not present

## 2016-05-08 LAB — CBC & DIFF AND RETIC
BASO%: 0.3 % (ref 0.0–2.0)
Basophils Absolute: 0 10*3/uL (ref 0.0–0.1)
EOS%: 1.2 % (ref 0.0–7.0)
Eosinophils Absolute: 0.1 10*3/uL (ref 0.0–0.5)
HEMATOCRIT: 40.8 % (ref 34.8–46.6)
HEMOGLOBIN: 13.3 g/dL (ref 11.6–15.9)
IMMATURE RETIC FRACT: 3.9 % (ref 1.60–10.00)
LYMPH%: 20.9 % (ref 14.0–49.7)
MCH: 27 pg (ref 25.1–34.0)
MCHC: 32.6 g/dL (ref 31.5–36.0)
MCV: 82.9 fL (ref 79.5–101.0)
MONO#: 0.6 10*3/uL (ref 0.1–0.9)
MONO%: 8 % (ref 0.0–14.0)
NEUT#: 5.4 10*3/uL (ref 1.5–6.5)
NEUT%: 69.6 % (ref 38.4–76.8)
PLATELETS: 338 10*3/uL (ref 145–400)
RBC: 4.92 10*6/uL (ref 3.70–5.45)
Retic %: 1.27 % (ref 0.70–2.10)
Retic Ct Abs: 62.48 10*3/uL (ref 33.70–90.70)
WBC: 7.7 10*3/uL (ref 3.9–10.3)
lymph#: 1.6 10*3/uL (ref 0.9–3.3)
nRBC: 0 % (ref 0–0)

## 2016-05-08 LAB — FERRITIN: Ferritin: 93 ng/ml (ref 9–269)

## 2016-05-09 LAB — ANTI-PARIETAL ANTIBODY: Parietal Cell Ab: 3.5 Units (ref 0.0–20.0)

## 2016-05-09 LAB — VITAMIN B12

## 2016-05-09 LAB — INTRINSIC FACTOR ANTIBODIES: INTRINSIC FACTOR ABS, SERUM: 0.9 [AU]/ml (ref 0.0–1.1)

## 2016-05-15 ENCOUNTER — Telehealth: Payer: Self-pay | Admitting: *Deleted

## 2016-05-15 ENCOUNTER — Telehealth: Payer: Self-pay | Admitting: Hematology

## 2016-05-15 NOTE — Telephone Encounter (Signed)
Per staff message, called pt to review ferritin results.  Informed pt there is no indication for additional IV iron at this time. Advised pt to follow up in about 2 months.  Pt verbalized understanding.  Message sent to schedulers.

## 2016-05-15 NOTE — Telephone Encounter (Signed)
-----   Message from Brunetta Genera, MD sent at 05/15/2016  9:50 AM EDT ----- Plz let patient know her ferritin level are good at 93. No indication for additional IV iron art this time. Plz send scheduler msg to setup f/u in 2 months with labs. Thanks Edroy

## 2016-05-15 NOTE — Progress Notes (Signed)
Marland Kitchen    HEMATOLOGY/ONCOLOGY CLINIC NOTE  Date of Service: .05/08/2016  Patient Care Team: Lawerance Cruel, MD as PCP - General  CHIEF COMPLAINTS/PURPOSE OF CONSULTATION:  Iron deficiency Anemia and B12 deficiency  HISTORY OF PRESENTING ILLNESS:   [name] [name] is a wonderful 83 y.o. female who has been referred to [place] by Dr .Melinda Crutch, MD for evaluation and management of microcytic anemia.  Patient has a h/o HTN, atrial tachycardia, , benign tremor who was noted to have a new severe microcytic anemia and weakness with a drop of hgb down to 7.4 in sept 2017. She was admitted to the hospital and received 1 unit of PRBC. She was noted to have severe new iron deficiency and also noted to have B12 deficiency. Her stool occult blood was apparently neg. Was discharged on PO iron and was taking it for a few months. Rpt stool studies with PCP were again noted to be hemoccult neg. She received B12 IM x 1 in the hospital but has not been on any B12 since then. Patient had refused a GI workup in the hospital and was to be seen by Dr Fuller Plan for GI workup in the outpatient setting but has refused and continues to refuse and GI workup.  Denies any overt GI bleeding. No melena no hematemesis no hematochezia. No nausea/vomiting or abdominal pain. No significant weight loss recently.  Her hgb improved some on Po iron but then started dropping again and so she was referred to [place] for consideration of IV iron replacement.  On CXR in the hospital she was incidentally noted to have a moderate to large hiatal hernia.    INTERVAL HISTORY  Patient  Is here for fu of her Iron deficiency anemia and B12 deficiency. Tolerated IV iron well without any issues and notes improved energy levels. Her hgb has improved from 9.8 up to 13.3. Ferritin much improved. B12 now WNL. No overt GI bleeding reported. No abdominal pain or discomfort.  MEDICAL HISTORY:  Past Medical History:  Diagnosis Date  . Atrial tachycardia  ([place])   . HTN (hypertension)   . Jaundice due to hepatitis    at age 63 or 76  . Transient global amnesia    AMS  . Tremors of nervous system    benign familial  Large Hiatal hernia HCAP 10/2015 Iron deficiency Anemia B12 deficiency.  SURGICAL HISTORY: Past Surgical History:  Procedure Laterality Date  . ABDOMINAL HERNIA REPAIR    . BACK SURGERY     years ago  . TONSILLECTOMY      SOCIAL HISTORY: Social History   Social History  . Marital status: Married    Spouse name: N/A  . Number of children: N/A  . Years of education: N/A   Occupational History  . Not on file.   Social History Main Topics  . Smoking status: Never Smoker  . Smokeless tobacco: Never Used     Comment: + prior 2nd hand exposure from spouse smoking  . Alcohol use No  . Drug use: No  . Sexual activity: No   Other Topics Concern  . Not on file   Social History Narrative  . No narrative on file    FAMILY HISTORY: Family History  Problem Relation Age of Onset  . Lung cancer Mother   . Parkinson's disease Mother   . Hypertension Mother   . Other Father     blood clot  . Arthritis Sister   . Other Sister  spinal stenosis    ALLERGIES:  is allergic to aspirin; codeine; hydrochlorothiazide; penicillins; sulfonamide derivatives; levofloxacin; and macrodantin [nitrofurantoin].  MEDICATIONS:  Current Outpatient Prescriptions  Medication Sig Dispense Refill  . amLODipine (NORVASC) 10 MG tablet Take 1 tablet (10 mg total) by mouth daily. 90 tablet 3  . b complex vitamins capsule Take 1 capsule by mouth daily. 30 capsule 2  . cloNIDine (CATAPRES) 0.2 MG tablet Pt takes 1 tablet in the morning and 1.5 mg at bedtime 225 tablet 3  . Cyanocobalamin (B-12) 1000 MCG SUBL Place 1,000 mcg under the tongue daily. 30 each 3  . metoprolol tartrate (LOPRESSOR) 25 MG tablet Take 0.5 tablets (12.5 mg total) by mouth 2 (two) times daily. 90 tablet 2  . polyethylene glycol (MIRALAX / GLYCOLAX) packet Take  17 g by mouth daily as needed for mild constipation. 30 each 0  . spironolactone (ALDACTONE) 50 MG tablet Take 1 tablet (50 mg total) by mouth daily. 90 tablet 2   No current facility-administered medications for this visit.     REVIEW OF SYSTEMS:    10 Point review of Systems was done is negative except as noted above.  PHYSICAL EXAMINATION: ECOG PERFORMANCE STATUS: 2 - Symptomatic, <50% confined to bed  . Vitals:   05/08/16 1319  BP: (!) 145/77  Pulse: 70  Resp: 18  Temp: 97.9 F (36.6 C)   Filed Weights   05/08/16 1319  Weight: 164 lb 11.2 oz (74.7 kg)   .Body mass index is 30.12 kg/m.  GENERAL:alert, in no acute distress and comfortable SKIN: skin color, texture, turgor are normal, no rashes or significant lesions EYES: normal, conjunctiva are pink and non-injected, sclera clear OROPHARYNX:no exudate, no erythema and lips, buccal mucosa, and tongue normal  NECK: supple, no JVD, thyroid normal size, non-tender, without nodularity LYMPH:  no palpable lymphadenopathy in the cervical, axillary or inguinal LUNGS: clear to auscultation with normal respiratory effort HEART: regular rate & rhythm,  no murmurs and no lower extremity edema ABDOMEN: abdomen soft, non-tender, normoactive bowel sounds  Musculoskeletal: no cyanosis of digits and no clubbing  PSYCH: alert & oriented x 3 with fluent speech NEURO: no focal motor/sensory deficits  LABORATORY DATA:  I have reviewed the data as listed  . CBC Latest Ref Rng & Units 05/08/2016 03/13/2016 11/03/2015  WBC 3.9 - 10.3 10e3/uL 7.7 6.4 4.8  Hemoglobin 11.6 - 15.9 g/dL 13.3 9.8(L) 10.1(L)  Hematocrit 34.8 - 46.6 % 40.8 31.4(L) 34.9(L)  Platelets 145 - 400 10e3/uL 338 433(H) 402(H)    . CMP Latest Ref Rng & Units 03/13/2016 03/13/2016 11/03/2015  Glucose 70 - 140 mg/dl 94 - 107(H)  BUN 7.0 - 26.0 mg/dL 14.7 - 10  Creatinine 0.6 - 1.1 mg/dL 0.8 - 0.64  Sodium 136 - 145 mEq/L 139 - 140  Potassium 3.5 - 5.1 mEq/L 4.2 - 4.4    Chloride 101 - 111 mmol/L - - 109  CO2 22 - 29 mEq/L 23 - 24  Calcium 8.4 - 10.4 mg/dL 9.5 - 8.9  Total Protein 6.0 - 8.5 g/dL 7.9 7.5 -  Total Bilirubin 0.20 - 1.20 mg/dL 0.30 - -  Alkaline Phos 40 - 150 U/L 130 - -  AST 5 - 34 U/L 13 - -  ALT 0 - 55 U/L 11 - -   Component     Latest Ref Rng & Units 05/08/2016  Parietal Cell Ab     0.0 - 20.0 Units 3.5  Intrinsic Factor Abs, Serum  0.0 - 1.1 AU/mL 0.9        RADIOGRAPHIC STUDIES: I have personally reviewed the radiological images as listed and agreed with the findings in the report. No results found.  ASSESSMENT & PLAN:   83 yo caucasian female with   1) Severe Microcytic Anemia s/p PRBc transfusion in sept 2017.  This appears to be likely related to severe iron deficiency  2) Severe Iron deficiency No overt source of bleeding Statistically more likely to be due to slow ongoing GI losses ?HH with cameron ulcers vs AVMs vs diverticular bleeding vs PUD vs tumors  Discussed this understanding with patient and she is definitively opposed to any idea of a GI workup Much improved with IV Iron 3) B12 deficiency - antiparietal cell and anti IF ab neg  PLAN -patient's anemia has resolved with aggressive IV Iron replacement and she feels much better. -will pursue prn IV Iron replacement to maintain ferritin >=100. -current ferritin close to 100...wil monitor. -continue vit B complex 1 tab po daily to support accelerated hematopoiesis.  4) . Patient Active Problem List   Diagnosis Date Noted  . HCAP (healthcare-associated pneumonia) 10/31/2015  . Iron deficiency anemia 10/20/2015  . B12 deficiency anemia 10/20/2015  . Symptomatic anemia 10/19/2015  . UTI (urinary tract infection) 07/26/2014  . TGA (transient global amnesia) 07/25/2014  . Cardiac dysrhythmia 02/06/2010  . PALPITATIONS 02/06/2010  . CHICKENPOX, HX OF 09/07/2008  . Essential hypertension, benign 05/22/2008  . PSVT 03/27/2008  . OBESITY, UNSPECIFIED  03/26/2008  . Abnormal involuntary movement 03/26/2008  . AMNESIA, TRANSIENT GLOBAL 01/10/2007   -f/u with PCP for management of her other medical co-morbids  RTC with Dr Irene Limbo in 2 months with labs  All of the patients questions were answered with apparent satisfaction. The patient knows to call the clinic with any problems, questions or concerns.  I spent 20 minutes counseling the patient face to face. The total time spent in the appointment was 20 minutes and more than 50% was on counseling and direct patient cares.    Sullivan Lone MD [place] AAHIVMS [facility] Hematology/Oncology Physician [facility]  (Office):       [phone] (Work cell):  [phone] (Fax):           [phone]

## 2016-05-15 NOTE — Telephone Encounter (Signed)
Patient is aware of new aptp date and time . Sent reminder letter in the mail.

## 2016-05-16 ENCOUNTER — Telehealth: Payer: Self-pay

## 2016-05-16 NOTE — Telephone Encounter (Signed)
Husband concerned about elevated B12 levels. Explained that b/c she got a B12 shot and is taking oral B12 it went up. It should slowly go back down to normal range. There should not be any adverse affects from the slight elevation. Husband was appreciative.

## 2016-06-09 ENCOUNTER — Encounter: Payer: Self-pay | Admitting: Obstetrics & Gynecology

## 2016-06-09 ENCOUNTER — Ambulatory Visit (INDEPENDENT_AMBULATORY_CARE_PROVIDER_SITE_OTHER): Payer: MEDICARE | Admitting: Obstetrics & Gynecology

## 2016-06-09 VITALS — BP 140/80 | Temp 97.6°F

## 2016-06-09 DIAGNOSIS — N898 Other specified noninflammatory disorders of vagina: Secondary | ICD-10-CM | POA: Diagnosis not present

## 2016-06-09 DIAGNOSIS — Z4689 Encounter for fitting and adjustment of other specified devices: Secondary | ICD-10-CM | POA: Diagnosis not present

## 2016-06-09 DIAGNOSIS — R350 Frequency of micturition: Secondary | ICD-10-CM | POA: Diagnosis not present

## 2016-06-09 DIAGNOSIS — R102 Pelvic and perineal pain: Secondary | ICD-10-CM | POA: Diagnosis not present

## 2016-06-09 LAB — URINALYSIS W MICROSCOPIC + REFLEX CULTURE
Bilirubin Urine: NEGATIVE
CASTS: NONE SEEN [LPF]
Crystals: NONE SEEN [HPF]
Glucose, UA: NEGATIVE
Nitrite: NEGATIVE
PH: 5 (ref 5.0–8.0)
YEAST: NONE SEEN [HPF]

## 2016-06-09 LAB — WET PREP FOR TRICH, YEAST, CLUE
Trich, Wet Prep: NONE SEEN
Yeast Wet Prep HPF POC: NONE SEEN

## 2016-06-09 MED ORDER — CIPROFLOXACIN HCL 500 MG PO TABS: 500.0000 mg | ORAL_TABLET | Freq: Two times a day (BID) | ORAL | 0 refills | Status: DC

## 2016-06-09 NOTE — Patient Instructions (Signed)
1. Urinary frequency  U/A abnormal.  U. Culture pending.  Treat with Ciprofloxacin 500 mg BID x 5 days.  2. Female pelvic pain Normal Gyn exam, no adnexal mass felt.  Vaginal d/c associated with Pessary/inflammation/Superficial erosion.  Wet prep Mild BV  3. Pessary maintenance Inflammation/superficial erosion secondary to Pessary.  Pessary removed, not put back in place.  F/U May 11th to reassess Vaginal inflammation/superficial erosions.   [name] [name], it was a pleasure to see you today!

## 2016-06-09 NOTE — Progress Notes (Signed)
    [name] [name] January 30, 1928 [id number]        83 y.o.  G4P4   RP:  Urinary frequency and pelvic discomfort x a few days  Report chills without fever last week, resolved since.  Urinary frequency with pelvic discomfort x a couple of days.  No blood seen in urine or from vagina.  Patient has Uterine Prolapse controled with a #7 Milex ring with support.  Last cleaning or pessary 03/2016. Denies vaginal d/c.  BMs normal this am.  Had a Cystitis treated with Cipro successfully 10/2015 during hospitalization.  No other episodes since.  Past medical history,surgical history, problem list, medications, allergies, family history and social history were all reviewed and documented in the EPIC chart.  Directed ROS with pertinent positives and negatives documented in the history of present illness/assessment and plan.  Exam:  Vitals:   06/09/16 1040  BP: 140/80   General appearance:  Normal  Abdominal exam:  Soft, NT, no distension  CVAT neg bilaterally  Gyn exam:  Vulva normal with atrophy.  Pessary removed.  Mild to moderate dark beige d/c on pessary.                     Speculum exam:  Inflammation/superficial erosion deep in vagina from pessary.    U/A:  Blood present, Prtn +, Leuko present.   Wet prep:  Few Clue Cells, Mod WBCs.  Assessment/Plan:  83 y.o. G4P4  1. Urinary frequency  U/A abnormal.  U. Culture pending.  Treat with Ciprofloxacin 500 mg BID x 5 days.  2. Female pelvic pain Normal Gyn exam, no adnexal mass felt.  Vaginal d/c associated with Pessary/inflammation/Superficial erosion.  Wet prep Mild BV  3. Pessary maintenance Inflammation/superficial erosion secondary to Pessary.  Pessary removed, not put back in place.  F/U May 11th to reassess Vaginal inflammation/superficial erosions.    Counseling >50% x 15 minutes.  Princess Bruins MD, 10:58 AM 06/09/2016

## 2016-06-10 LAB — URINE CULTURE

## 2016-06-20 ENCOUNTER — Ambulatory Visit (INDEPENDENT_AMBULATORY_CARE_PROVIDER_SITE_OTHER): Payer: MEDICARE | Admitting: Obstetrics & Gynecology

## 2016-06-20 ENCOUNTER — Encounter: Payer: Self-pay | Admitting: Obstetrics & Gynecology

## 2016-06-20 VITALS — BP 124/78 | Ht 62.0 in | Wt 161.0 lb

## 2016-06-20 DIAGNOSIS — T8389XD Other specified complication of genitourinary prosthetic devices, implants and grafts, subsequent encounter: Principal | ICD-10-CM

## 2016-06-20 DIAGNOSIS — Z4689 Encounter for fitting and adjustment of other specified devices: Secondary | ICD-10-CM | POA: Diagnosis not present

## 2016-06-20 DIAGNOSIS — N819 Female genital prolapse, unspecified: Secondary | ICD-10-CM

## 2016-06-20 DIAGNOSIS — N898 Other specified noninflammatory disorders of vagina: Secondary | ICD-10-CM

## 2016-06-20 NOTE — Patient Instructions (Signed)
1. Vaginal erosion secondary to pessary use, subsequent encounter Milex ring with support #8 had been removed last visit.  Improved, but still some inflammation.  Decision not to use #8 anymore.  Will order a smaller ring and f/u for insertion.  2. Fitting and adjustment of pessary Milex ring with support #6 fits better.  Less risk of inflammation/erosion in the future.  Stayed in place with walking/moving around.  Ordered.  Will f/u for insertion.  3. Vaginal vault prolapse  Pessary.  4. Inflammed external hemorrhoid Continue with CS cream.  Push it in as often as possible.  Mineral oil to help stools move out.  [name] [name], it was a pleasure to see you today!  See you next week with the new pessary.  My staff will call you when it arrives.

## 2016-06-20 NOTE — Progress Notes (Signed)
    [name] [name] 27-Jun-1927 [id number]        83 y.o.  G4P4   RP:  Vaginal inflammation and erosion secondary to pessary.  Milex ring with support #8 was removed at last visit at [facility] about 6 weeks ago because of vaginal mucosa inflammation/erosion.  Patient has chronic constipation which is under control with a high fiber diet and Dulcolax.  But without the pessary, difficult to push for her BMs.  No abnormal vaginal d/c or bleeding.  Past medical history,surgical history, problem list, medications, allergies, family history and social history were all reviewed and documented in the EPIC chart.  Directed ROS with pertinent positives and negatives documented in the history of present illness/assessment and plan.  Exam:  Vitals:   06/20/16 1429  Height: 5\' 2"  (1.575 m)   General appearance:  Normal  Gyn exam:  Complete vaginal vault prolapse.  Mucosa healing with no erosion, but still inflammation.                     Milex ring with support #8 inserted, but feels too large, worried it will irritate her again.                     Milex ring with support #6 fitted in.  Patient walked and moved around without expulsing the pessary.   Perianal area:  Inflamed external hemorrhoids, non-thrombosed.   Assessment/Plan:  83 y.o. G4P4   1. Vaginal erosion secondary to pessary use, subsequent encounter Milex ring with support #8 had been removed last visit.  Improved, but still some inflammation.  Decision not to use #8 anymore.  Will order a smaller ring and f/u for insertion.  2. Fitting and adjustment of pessary Milex ring with support #6 fits better.  Less risk of inflammation/erosion in the future.  Stayed in place with walking/moving around.  Ordered.  Will f/u for insertion.  3. Vaginal vault prolapse  Pessary.  4. Inflammed external hemorrhoid Continue with CS cream.  Push it in as often as possible.  Mineral oil to help stools move out.  Counseling on about issues  >50% x 25 minutes  Princess Bruins MD, 2:30 PM 06/20/2016

## 2016-06-22 ENCOUNTER — Telehealth: Payer: Self-pay | Admitting: Gynecology

## 2016-06-22 NOTE — Telephone Encounter (Signed)
On Call Note:  Patient's daughter calls noting over the last day or so alternating constipation and diarrhea with some fecal incontinence.  Had been using a ring pessary for years for vaginal vault prolapse.  Removed 06/09/2016 secondary to inflammation and was subsequently fitted with a smaller size 06/20/2016 which was ordered.  Patient currently without pessary since her 06/09/2016 visit. No fever, chills, abdominal pain, UTI symptoms, voiding without difficulty, eating, drinking.  Reviewed with daughter does not sound pessary/gyn related. Sounds GI i.e. colitis possibly.  Decision for today is whether ER eval or not. Daughter relates mother refuses ER.  Recommended follow up with her primary MD to eval and triage in the AM, ER visit tonight if more worrisome symptoms develop.  Daughter comfortable with the plan.

## 2016-06-23 DIAGNOSIS — R109 Unspecified abdominal pain: Secondary | ICD-10-CM | POA: Diagnosis not present

## 2016-06-23 DIAGNOSIS — K5900 Constipation, unspecified: Secondary | ICD-10-CM | POA: Diagnosis not present

## 2016-07-02 ENCOUNTER — Ambulatory Visit (INDEPENDENT_AMBULATORY_CARE_PROVIDER_SITE_OTHER): Payer: MEDICARE | Admitting: Obstetrics & Gynecology

## 2016-07-02 ENCOUNTER — Encounter: Payer: Self-pay | Admitting: Obstetrics & Gynecology

## 2016-07-02 VITALS — BP 136/72

## 2016-07-02 DIAGNOSIS — Z4689 Encounter for fitting and adjustment of other specified devices: Secondary | ICD-10-CM

## 2016-07-02 DIAGNOSIS — N898 Other specified noninflammatory disorders of vagina: Secondary | ICD-10-CM

## 2016-07-02 DIAGNOSIS — N813 Complete uterovaginal prolapse: Secondary | ICD-10-CM

## 2016-07-02 DIAGNOSIS — T8389XD Other specified complication of genitourinary prosthetic devices, implants and grafts, subsequent encounter: Principal | ICD-10-CM

## 2016-07-02 NOTE — Progress Notes (Signed)
    [name] [name] [birth year] [id number]        83 y.o.  G4P4   RP:  Vaginal erosions healing off Pessary  New smaller pessary fitted last visit and ordered.  Since last visit, had constipation issues, now better controled with Magnesium Citrate.  Prolapse very uncomfortable.  No vaginal d/c.  No UTI Sx currently.    Past medical history,surgical history, problem list, medications, allergies, family history and social history were all reviewed and documented in the EPIC chart.  Directed ROS with pertinent positives and negatives documented in the history of present illness/assessment and plan.  Exam:  Vitals:   07/02/16 1356  BP: 136/72   General appearance:  Normal  Gyn exam:  Vulva normal                     Speculum:  No vaginal erosion/ulcer anymore.  Normal vaginal secretions.  Complete uterine prolapse.                     Bimanual exam:  Complete prolapse.  Vaginal mucosa feels normal.  Assessment/Plan:  83 y.o. G4P4   1. Vaginal erosion secondary to pessary use, subsequent encounter Resolved off Pessary.    2. Fitting and adjustment of pessary Milex Ring with support #6 put in place.  Good fit.  Patient will f/u in 1 month to assure no recurrence of erosion/ulcers.  3. Complete uterine prolapse Very Sxic, will try controlling Sxs with a smaller Milex Ring pessary with support.  Counseling >50% x 10 minutes on erosions/ulcers secondary to larger pessary.  Princess Bruins MD, 2:16 PM 07/02/2016

## 2016-07-02 NOTE — Patient Instructions (Signed)
1. Vaginal erosion secondary to pessary use, subsequent encounter Resolved off Pessary.    2. Fitting and adjustment of pessary Milex Ring with support #6 put in place.  Good fit.  Patient will f/u in 1 month to assure no recurrence of erosion/ulcers.  3. Complete uterine prolapse Very Sxic, will try controlling Sxs with a smaller Milex Ring pessary with support.   Shawnell, as always, very nice to see you today!  Hope you do better with this smaller size pessary.  Will see you in 1 month.

## 2016-07-15 ENCOUNTER — Telehealth: Payer: Self-pay | Admitting: Hematology

## 2016-07-15 ENCOUNTER — Encounter: Payer: Self-pay | Admitting: Hematology

## 2016-07-15 ENCOUNTER — Other Ambulatory Visit (HOSPITAL_BASED_OUTPATIENT_CLINIC_OR_DEPARTMENT_OTHER): Payer: MEDICARE

## 2016-07-15 ENCOUNTER — Ambulatory Visit (HOSPITAL_BASED_OUTPATIENT_CLINIC_OR_DEPARTMENT_OTHER): Payer: MEDICARE | Admitting: Hematology

## 2016-07-15 VITALS — BP 174/61 | HR 67 | Temp 97.7°F | Resp 18 | Ht 62.0 in | Wt 161.8 lb

## 2016-07-15 DIAGNOSIS — D500 Iron deficiency anemia secondary to blood loss (chronic): Secondary | ICD-10-CM

## 2016-07-15 DIAGNOSIS — D509 Iron deficiency anemia, unspecified: Secondary | ICD-10-CM | POA: Diagnosis not present

## 2016-07-15 DIAGNOSIS — E538 Deficiency of other specified B group vitamins: Secondary | ICD-10-CM | POA: Diagnosis not present

## 2016-07-15 DIAGNOSIS — E611 Iron deficiency: Secondary | ICD-10-CM | POA: Diagnosis not present

## 2016-07-15 LAB — CBC & DIFF AND RETIC
BASO%: 0.4 % (ref 0.0–2.0)
Basophils Absolute: 0 10*3/uL (ref 0.0–0.1)
EOS%: 1.8 % (ref 0.0–7.0)
Eosinophils Absolute: 0.1 10*3/uL (ref 0.0–0.5)
HCT: 40.6 % (ref 34.8–46.6)
HGB: 12.9 g/dL (ref 11.6–15.9)
Immature Retic Fract: 7.2 % (ref 1.60–10.00)
LYMPH#: 2 10*3/uL (ref 0.9–3.3)
LYMPH%: 26.3 % (ref 14.0–49.7)
MCH: 27.7 pg (ref 25.1–34.0)
MCHC: 31.8 g/dL (ref 31.5–36.0)
MCV: 87.3 fL (ref 79.5–101.0)
MONO#: 0.6 10*3/uL (ref 0.1–0.9)
MONO%: 7.5 % (ref 0.0–14.0)
NEUT%: 64 % (ref 38.4–76.8)
NEUTROS ABS: 4.9 10*3/uL (ref 1.5–6.5)
Platelets: 413 10*3/uL — ABNORMAL HIGH (ref 145–400)
RBC: 4.65 10*6/uL (ref 3.70–5.45)
RDW: 13.2 % (ref 11.2–14.5)
RETIC %: 1.39 % (ref 0.70–2.10)
RETIC CT ABS: 64.64 10*3/uL (ref 33.70–90.70)
WBC: 7.6 10*3/uL (ref 3.9–10.3)

## 2016-07-15 LAB — COMPREHENSIVE METABOLIC PANEL
ALT: 10 U/L (ref 0–55)
AST: 16 U/L (ref 5–34)
Albumin: 4 g/dL (ref 3.5–5.0)
Alkaline Phosphatase: 118 U/L (ref 40–150)
Anion Gap: 10 mEq/L (ref 3–11)
BILIRUBIN TOTAL: 0.34 mg/dL (ref 0.20–1.20)
BUN: 13.5 mg/dL (ref 7.0–26.0)
CHLORIDE: 105 meq/L (ref 98–109)
CO2: 24 meq/L (ref 22–29)
Calcium: 9.8 mg/dL (ref 8.4–10.4)
Creatinine: 0.8 mg/dL (ref 0.6–1.1)
EGFR: 64 mL/min/{1.73_m2} — ABNORMAL LOW (ref >90)
GLUCOSE: 110 mg/dL (ref 70–140)
Potassium: 3.8 mEq/L (ref 3.5–5.1)
SODIUM: 140 meq/L (ref 136–145)
TOTAL PROTEIN: 7.9 g/dL (ref 6.4–8.3)

## 2016-07-15 LAB — IRON AND TIBC
%SAT: 8 % — AB (ref 21–57)
Iron: 36 ug/dL — ABNORMAL LOW (ref 41–142)
TIBC: 469 ug/dL — ABNORMAL HIGH (ref 236–444)
UIBC: 433 ug/dL — ABNORMAL HIGH (ref 120–384)

## 2016-07-15 LAB — FERRITIN: Ferritin: 10 ng/ml (ref 9–269)

## 2016-07-15 NOTE — Telephone Encounter (Signed)
Per 6/5 LOS - appts already scheduled per 6/5 LOS

## 2016-07-15 NOTE — Patient Instructions (Signed)
Thank you for choosing [facility] to provide your oncology and hematology care.  To afford each patient quality time with our providers, please arrive 30 minutes before your scheduled appointment time.  If you arrive late for your appointment, you may be asked to reschedule.  We strive to give you quality time with our providers, and arriving late affects you and other patients whose appointments are after yours.   If you are a no show for multiple scheduled visits, you may be dismissed from the clinic at the providers discretion.    Again, thank you for choosing [facility], our hope is that these requests will decrease the amount of time that you wait before being seen by our physicians.  ______________________________________________________________________  Should you have questions after your visit to the [facility], please contact our office at [phone] between the hours of 8:30 and 4:30 p.m.    Voicemails left after 4:30p.m will not be returned until the following business day.    For prescription refill requests, please have your pharmacy contact us directly.  Please also try to allow 48 hours for prescription requests.    Please contact the scheduling department for questions regarding scheduling.  For scheduling of procedures such as PET scans, CT scans, MRI, Ultrasound, etc please contact central scheduling at [phone].    Resources For Cancer Patients and Caregivers:   Oncolink.org:  A wonderful resource for patients and healthcare providers for information regarding your disease, ways to tract your treatment, what to expect, etc.     American Cancer Society:  800-227-2345  Can help patients locate various types of support and financial assistance  Cancer Care: 1-800-813-HOPE (4673) Provides financial assistance, online support groups, medication/co-pay assistance.    [place] DSS:  336-641-3447 Where to apply for food  stamps, Medicaid, and utility assistance  Medicare Rights Center: 800-333-4114 Helps people with Medicare understand their rights and benefits, navigate the Medicare system, and secure the quality healthcare they deserve  SCAT: 336-333-6589 [place] [employer]'s shared-ride transportation service for eligible riders who have a disability that prevents them from riding the fixed route bus.    For additional information on assistance programs please contact our social worker:   Grier Hock/Abigail Elmore:  [phone]            

## 2016-07-17 ENCOUNTER — Other Ambulatory Visit: Payer: Self-pay | Admitting: Cardiology

## 2016-07-24 NOTE — Progress Notes (Signed)
HPI The patient presents for follow up of HTN.  She did have a problem with vaginal infection related to have surgery recently. She ended up as a complication of this with constipation. She took herself off of spironolactone for several weeks. She thought she might get dehydrated. Blood pressures did go up. There were no 409 systolic and I reviewed the diary. However, they come back down. She's been transfused with iron and her hemoglobin has been going up. She actually has less fatigue because of this.  Allergies  Allergen Reactions  . Aspirin Hives  . Codeine Other (See Comments)    REACTION: GI upset  . Hydrochlorothiazide Rash  . Penicillins Hives, Shortness Of Breath and Rash    Has patient had a PCN reaction causing immediate rash, facial/tongue/throat swelling, SOB or lightheadedness with hypotension: yes Has patient had a PCN reaction causing severe rash involving mucus membranes or skin necrosis: unknown Has patient had a PCN reaction that required hospitalization : unknown Has patient had a PCN reaction occurring within the last 10 years: no If all of the above answers are "NO", then may proceed with Cephalosporin use.   . Sulfonamide Derivatives Rash  . Levofloxacin Other (See Comments)    Joint aches and weakness  . Macrodantin [Nitrofurantoin] Other (See Comments)    Unknown reaction    Current Outpatient Prescriptions  Medication Sig Dispense Refill  . amLODipine (NORVASC) 10 MG tablet Take 1 tablet (10 mg total) by mouth daily. 90 tablet 3  . cloNIDine (CATAPRES) 0.2 MG tablet Pt takes 1 tablet in the morning and 1.5 mg at bedtime 225 tablet 3  . iron dextran complex in sodium chloride 0.9 % 500 mL Inject into the vein once.    . magnesium citrate SOLN Take 1 Bottle by mouth once.    . metoprolol tartrate (LOPRESSOR) 25 MG tablet TAKE 1/2 TABLET TWICE A DAY 90 tablet 2  . spironolactone (ALDACTONE) 50 MG tablet TAKE 1 TABLET DAILY 90 tablet 2   No current  facility-administered medications for this visit.     Past Medical History:  Diagnosis Date  . Atrial tachycardia ([place])   . HTN (hypertension)   . Jaundice due to hepatitis    at age 61 or 80  . Transient global amnesia    AMS  . Tremors of nervous system    benign familial    Past Surgical History:  Procedure Laterality Date  . ABDOMINAL HERNIA REPAIR    . BACK SURGERY     years ago  . TONSILLECTOMY     ROS:   As stated in the HPI and negative for all other systems.  PHYSICAL EXAM BP 140/76   Pulse 76   Ht 5\' 2"  (1.575 m)   Wt 159 lb (72.1 kg)   BMI 29.08 kg/m   GENERAL:  Well appearing NECK:  No jugular venous distention, waveform within normal limits, carotid upstroke brisk and symmetric, no bruits, no thyromegaly LUNGS:  Clear to auscultation bilaterally CHEST:  Unremarkable HEART:  PMI not displaced or sustained,S1 and S2 within normal limits, no S3, no S4, no clicks, no rubs, no murmurs ABD:  Flat, positive bowel sounds normal in frequency in pitch, no bruits, no rebound, no guarding, no midline pulsatile mass, no hepatomegaly, no splenomegaly EXT:  2 plus pulses throughout, mild right ankle edema, no cyanosis no clubbing NEURO:  Mild tremor.     ASSESSMENT AND PLAN   ESSENTIAL HYPERTENSION, BENIGN -  Her BP  is cont on current therapy. No change in medications is planned.  FATIGUE -  This is  improved.  No further testing is planned.

## 2016-07-25 ENCOUNTER — Ambulatory Visit (INDEPENDENT_AMBULATORY_CARE_PROVIDER_SITE_OTHER): Payer: MEDICARE | Admitting: Cardiology

## 2016-07-25 ENCOUNTER — Encounter: Payer: Self-pay | Admitting: Cardiology

## 2016-07-25 VITALS — BP 140/76 | HR 76 | Ht 62.0 in | Wt 159.0 lb

## 2016-07-25 DIAGNOSIS — I10 Essential (primary) hypertension: Secondary | ICD-10-CM | POA: Diagnosis not present

## 2016-07-25 DIAGNOSIS — R5383 Other fatigue: Secondary | ICD-10-CM

## 2016-07-25 NOTE — Patient Instructions (Signed)

## 2016-07-30 ENCOUNTER — Encounter: Payer: Self-pay | Admitting: Obstetrics & Gynecology

## 2016-07-30 ENCOUNTER — Ambulatory Visit (INDEPENDENT_AMBULATORY_CARE_PROVIDER_SITE_OTHER): Payer: MEDICARE | Admitting: Obstetrics & Gynecology

## 2016-07-30 VITALS — BP 150/86 | Ht 62.0 in | Wt 159.0 lb

## 2016-07-30 DIAGNOSIS — T8389XD Other specified complication of genitourinary prosthetic devices, implants and grafts, subsequent encounter: Secondary | ICD-10-CM

## 2016-07-30 DIAGNOSIS — Z4689 Encounter for fitting and adjustment of other specified devices: Secondary | ICD-10-CM

## 2016-07-30 DIAGNOSIS — N814 Uterovaginal prolapse, unspecified: Secondary | ICD-10-CM | POA: Diagnosis not present

## 2016-07-30 DIAGNOSIS — N898 Other specified noninflammatory disorders of vagina: Secondary | ICD-10-CM

## 2016-07-30 NOTE — Patient Instructions (Signed)
  1. Pessary maintenance Excellent fit with smaller Milex Ring #5 with support.  Pessary cleaned and put back in place after speculum evaluation of the vaginal mucosa.  Will f/u Pessary check in 3 months.  2. Uterine prolapse Well with smaller pessary.  No urinary incontinence.  Normal BMs.  3. Vaginal erosion secondary to pessary use, subsequent encounter Resolved erosions and ulcers with the smaller Milex Ring #5 with support.  [name] [name], very nice to see you as always!

## 2016-07-30 NOTE — Progress Notes (Signed)
lab

## 2016-07-30 NOTE — Progress Notes (Signed)
    [name] [name] 02-13-1927 [id number]        83 y.o.  G4P4   RP:  Pessary check post change to a smaller size d/t vaginal ulcers  HPI:  Very comfortable with the new pessary.  Didn't fall out.  No vaginal bleeding.  No vaginal d/c.  No urinary incontinence.  Good normal BMs.  Past medical history,surgical history, problem list, medications, allergies, family history and social history were all reviewed and documented in the EPIC chart.  Directed ROS with pertinent positives and negatives documented in the history of present illness/assessment and plan.  Exam:  Vitals:   07/30/16 1202  BP: (!) 150/86  Weight: 159 lb (72.1 kg)  Height: 5\' 2"  (1.575 m)   General appearance:  Normal  Gyn exam:  Vulva normal                    Pessary easily removed and cleaned.                    Speculum:  Normal cervix and vaginal mucosa.  No erosion, no ulceration, no bleeding, normal secretions.                    Pessary put back in place, good fit.   Assessment/Plan:  83 y.o. G4P4   1. Pessary maintenance Excellent fit with smaller Milex Ring #5 with support.  Pessary cleaned and put back in place after speculum evaluation of the vaginal mucosa.  Will f/u Pessary check in 3 months.  2. Uterine prolapse Well with smaller pessary.  No urinary incontinence.  Normal BMs.  3. Vaginal erosion secondary to pessary use, subsequent encounter Resolved erosions and ulcers with the smaller Milex Ring #5 with support.  Counseling on above issues >50% x 15 minutes.  Princess Bruins MD, 12:22 PM 07/30/2016

## 2016-08-25 ENCOUNTER — Encounter: Payer: Self-pay | Admitting: Gynecology

## 2016-08-25 ENCOUNTER — Ambulatory Visit (INDEPENDENT_AMBULATORY_CARE_PROVIDER_SITE_OTHER): Payer: MEDICARE | Admitting: Gynecology

## 2016-08-25 ENCOUNTER — Other Ambulatory Visit: Payer: Self-pay | Admitting: Gynecology

## 2016-08-25 VITALS — BP 138/80 | Temp 98.3°F

## 2016-08-25 DIAGNOSIS — R05 Cough: Secondary | ICD-10-CM

## 2016-08-25 DIAGNOSIS — N3000 Acute cystitis without hematuria: Secondary | ICD-10-CM | POA: Diagnosis not present

## 2016-08-25 DIAGNOSIS — R059 Cough, unspecified: Secondary | ICD-10-CM

## 2016-08-25 DIAGNOSIS — R110 Nausea: Secondary | ICD-10-CM

## 2016-08-25 DIAGNOSIS — R0981 Nasal congestion: Secondary | ICD-10-CM

## 2016-08-25 DIAGNOSIS — R6883 Chills (without fever): Secondary | ICD-10-CM | POA: Diagnosis not present

## 2016-08-25 LAB — URINALYSIS W MICROSCOPIC + REFLEX CULTURE
BILIRUBIN URINE: NEGATIVE
Casts: NONE SEEN [LPF]
Crystals: NONE SEEN [HPF]
Glucose, UA: NEGATIVE
Hgb urine dipstick: NEGATIVE
Ketones, ur: NEGATIVE
NITRITE: NEGATIVE
PH: 6.5 (ref 5.0–8.0)
Protein, ur: NEGATIVE
RBC / HPF: NONE SEEN RBC/HPF (ref <=2)
Yeast: NONE SEEN [HPF]

## 2016-08-25 NOTE — Patient Instructions (Signed)
Ciprofloxacin tablets What is this medicine? CIPROFLOXACIN (sip roe FLOX a sin) is a quinolone antibiotic. It is used to treat certain kinds of bacterial infections. It will not work for colds, flu, or other viral infections. This medicine may be used for other purposes; ask your health care provider or pharmacist if you have questions. COMMON BRAND NAME(S): Cipro What should I tell my health care provider before I take this medicine? They need to know if you have any of these conditions: -bone problems -history of low levels of potassium in the blood -joint problems -irregular heartbeat -kidney disease -myasthenia gravis -seizures -tendon problems -tingling of the fingers or toes, or other nerve disorder -an unusual or allergic reaction to ciprofloxacin, other antibiotics or medicines, foods, dyes, or preservatives -pregnant or trying to get pregnant -breast-feeding How should I use this medicine? Take this medicine by mouth with a glass of water. Follow the directions on the prescription label. Take your medicine at regular intervals. Do not take your medicine more often than directed. Take all of your medicine as directed even if you think your are better. Do not skip doses or stop your medicine early. You can take this medicine with food or on an empty stomach. It can be taken with a meal that contains dairy or calcium, but do not take it alone with a dairy product, like milk or yogurt or calcium-fortified juice. A special MedGuide will be given to you by the pharmacist with each prescription and refill. Be sure to read this information carefully each time. Talk to your pediatrician regarding the use of this medicine in children. Special care may be needed. Overdosage: If you think you have taken too much of this medicine contact a poison control center or emergency room at once. NOTE: This medicine is only for you. Do not share this medicine with others. What if I miss a dose? If you  miss a dose, take it as soon as you can. If it is almost time for your next dose, take only that dose. Do not take double or extra doses. What may interact with this medicine? Do not take this medicine with any of the following medications: -cisapride -dofetilide -dronedarone -flibanserin -lomitapide -pimozide -thioridazine -tizanidine -ziprasidone This medicine may also interact with the following medications: -antacids -birth control pills -caffeine -certain medicines for diabetes, like glipizide or glyburide -certain medicines that treat or prevent blood clots like warfarin -clozapine -cyclosporine -didanosine (ddI) buffered tablets or powder -duloxetine -lanthanum carbonate -lidocaine -methotrexate -multivitamins -NSAIDS, medicines for pain and inflammation, like ibuprofen or naproxen -olanzapine -omeprazole -other medicines that prolong the QT interval (cause an abnormal heart rhythm) -phenytoin -probenecid -ropinirole -sevelamer -sildenafil -sucralfate -theophylline -zolpidem This list may not describe all possible interactions. Give your health care provider a list of all the medicines, herbs, non-prescription drugs, or dietary supplements you use. Also tell them if you smoke, drink alcohol, or use illegal drugs. Some items may interact with your medicine. What should I watch for while using this medicine? Tell your doctor or health care professional if your symptoms do not improve. Do not treat diarrhea with over the counter products. Contact your doctor if you have diarrhea that lasts more than 2 days or if it is severe and watery. You may get drowsy or dizzy. Do not drive, use machinery, or do anything that needs mental alertness until you know how this medicine affects you. Do not stand or sit up quickly, especially if you are an older patient. This reduces  the risk of dizzy or fainting spells. This medicine can make you more sensitive to the sun. Keep out of the  sun. If you cannot avoid being in the sun, wear protective clothing and use sunscreen. Do not use sun lamps or tanning beds/booths. Avoid antacids, aluminum, calcium, iron, magnesium, and zinc products for 6 hours before and 2 hours after taking a dose of this medicine. What side effects may I notice from receiving this medicine? Side effects that you should report to your doctor or health care professional as soon as possible: -allergic reactions like skin rash or hives, swelling of the face, lips, or tongue -anxious -confusion -depressed mood -diarrhea -fast, irregular heartbeat -hallucination, loss of contact with reality -joint, muscle, or tendon pain or swelling -pain, tingling, numbness in the hands or feet -suicidal thoughts or other mood changes -sunburn -unusually weak or tired Side effects that usually do not require medical attention (report to your doctor or health care professional if they continue or are bothersome): -dry mouth -headache -nausea -trouble sleeping This list may not describe all possible side effects. Call your doctor for medical advice about side effects. You may report side effects to FDA at 1-800-FDA-1088. Where should I keep my medicine? Keep out of the reach of children. Store at room temperature below 30 degrees C (86 degrees F). Keep container tightly closed. Throw away any unused medicine after the expiration date. NOTE: This sheet is a summary. It may not cover all possible information. If you have questions about this medicine, talk to your doctor, pharmacist, or health care provider.  2018 Elsevier/Gold Standard (2015-09-07 14:42:02) Urinary Tract Infection, Adult A urinary tract infection (UTI) is an infection of any part of the urinary tract, which includes the kidneys, ureters, bladder, and urethra. These organs make, store, and get rid of urine in the body. UTI can be a bladder infection (cystitis) or kidney infection (pyelonephritis). What  are the causes? This infection may be caused by fungi, viruses, or bacteria. Bacteria are the most common cause of UTIs. This condition can also be caused by repeated incomplete emptying of the bladder during urination. What increases the risk? This condition is more likely to develop if:  You ignore your need to urinate or hold urine for long periods of time.  You do not empty your bladder completely during urination.  You wipe back to front after urinating or having a bowel movement, if you are female.  You are uncircumcised, if you are female.  You are constipated.  You have a urinary catheter that stays in place (indwelling).  You have a weak defense (immune) system.  You have a medical condition that affects your bowels, kidneys, or bladder.  You have diabetes.  You take antibiotic medicines frequently or for long periods of time, and the antibiotics no longer work well against certain types of infections (antibiotic resistance).  You take medicines that irritate your urinary tract.  You are exposed to chemicals that irritate your urinary tract.  You are female.  What are the signs or symptoms? Symptoms of this condition include:  Fever.  Frequent urination or passing small amounts of urine frequently.  Needing to urinate urgently.  Pain or burning with urination.  Urine that smells bad or unusual.  Cloudy urine.  Pain in the lower abdomen or back.  Trouble urinating.  Blood in the urine.  Vomiting or being less hungry than normal.  Diarrhea or abdominal pain.  Vaginal discharge, if you are female.  How  is this diagnosed? This condition is diagnosed with a medical history and physical exam. You will also need to provide a urine sample to test your urine. Other tests may be done, including:  Blood tests.  Sexually transmitted disease (STD) testing.  If you have had more than one UTI, a cystoscopy or imaging studies may be done to determine the cause  of the infections. How is this treated? Treatment for this condition often includes a combination of two or more of the following:  Antibiotic medicine.  Other medicines to treat less common causes of UTI.  Over-the-counter medicines to treat pain.  Drinking enough water to stay hydrated.  Follow these instructions at home:  Take over-the-counter and prescription medicines only as told by your health care provider.  If you were prescribed an antibiotic, take it as told by your health care provider. Do not stop taking the antibiotic even if you start to feel better.  Avoid alcohol, caffeine, tea, and carbonated beverages. They can irritate your bladder.  Drink enough fluid to keep your urine clear or pale yellow.  Keep all follow-up visits as told by your health care provider. This is important.  Make sure to: ? Empty your bladder often and completely. Do not hold urine for long periods of time. ? Empty your bladder before and after sex. ? Wipe from front to back after a bowel movement if you are female. Use each tissue one time when you wipe. Contact a health care provider if:  You have back pain.  You have a fever.  You feel nauseous or vomit.  Your symptoms do not get better after 3 days.  Your symptoms go away and then return. Get help right away if:  You have severe back pain or lower abdominal pain.  You are vomiting and cannot keep down any medicines or water. This information is not intended to replace advice given to you by your health care provider. Make sure you discuss any questions you have with your health care provider. Document Released: 11/06/2004 Document Revised: 07/11/2015 Document Reviewed: 12/18/2014 Elsevier Interactive Patient Education  2017 [employer].

## 2016-08-25 NOTE — Progress Notes (Signed)
   Patient is an 83 year old that presented to the office today with her daughter stating that for the past couple of days she had some sinus tenderness a cough which she claims it was productive and had some chills and nausea. She has urinary frequency but no dysuria. She has no difficulty swallowing and no GI complaints.patient had been seen the office approximate 3 weeks ago for pessary maintenance and has done well with the pessary. She denied any vaginal discharge.  Exam: HEENT: Tender right maxillary sinus Lungs: Clear to auscultation no rhonchi's or wheezes Heart: Regular rate and rhythm no murmurs or gallops Abdomen soft nontender no rebound or guarding Back: No CVA tenderness Pelvic exam not done recently done less than 3 weeks ago. Extremities: No edema  Urinalysis dipstick 2+ leukocyte, 40-60 white blood count cell, many bacteria  Assessment/plan: Patient with apparent urinary tract infection review of patient's urinalysis and culture from September 2017 indicated in one of the microorganisms identified was Proteus and was sensitive to Cipro. She will be placed on Cipro 500 mg twice a day for 5 days which would also help with her sinusitis. And there is no improvement her symptoms in the next 72 hours she was instructed to return back to the office for reevaluation of her PCP or the emergency room.

## 2016-08-26 NOTE — Telephone Encounter (Signed)
Dr Warren Lacy did compltee this encounter

## 2016-08-27 LAB — URINE CULTURE

## 2016-09-01 ENCOUNTER — Telehealth: Payer: Self-pay | Admitting: *Deleted

## 2016-09-01 NOTE — Telephone Encounter (Signed)
Pt daughter Wells Guiles called (has DPR access) daughter asked if mother can have Rx for Lizness to help with bowel movements, has used a couple of daughter pills and done well with them. Pt has tried stool softens, magnesium citrate which causes diarrhea. Please advise

## 2016-09-02 NOTE — Telephone Encounter (Signed)
Left a detailed message on daughter cell [phone]

## 2016-09-02 NOTE — Telephone Encounter (Signed)
Patient is 83 yo and has other medical conditions on medications.  I recommend a more complete evaluation with her family MD to decide on the best treatment for her constipation.  Alternatively, I can refer her to a Gastro-enterologist.

## 2016-09-14 NOTE — Progress Notes (Signed)
[facility]    HEMATOLOGY/ONCOLOGY CLINIC NOTE  Date of Service: .07/15/2016  Patient Care Team: Lawerance Cruel, MD as PCP - General  CHIEF COMPLAINTS/PURPOSE OF CONSULTATION:  Iron deficiency Anemia and B12 deficiency  HISTORY OF PRESENTING ILLNESS:   [name] [name] is a wonderful 83 y.o. female who has been referred to [place] by Dr .Harrington Challenger, Dwyane Luo, MD for evaluation and management of microcytic anemia.  Patient has a h/o HTN, atrial tachycardia, , benign tremor who was noted to have a new severe microcytic anemia and weakness with a drop of hgb down to 7.4 in sept 2017. She was admitted to the hospital and received 1 unit of PRBC. She was noted to have severe new iron deficiency and also noted to have B12 deficiency. Her stool occult blood was apparently neg. Was discharged on PO iron and was taking it for a few months. Rpt stool studies with PCP were again noted to be hemoccult neg. She received B12 IM x 1 in the hospital but has not been on any B12 since then. Patient had refused a GI workup in the hospital and was to be seen by Dr Fuller Plan for GI workup in the outpatient setting but has refused and continues to refuse and GI workup.  Denies any overt GI bleeding. No melena no hematemesis no hematochezia. No nausea/vomiting or abdominal pain. No significant weight loss recently.  Her hgb improved some on Po iron but then started dropping again and so she was referred to [place] for consideration of IV iron replacement.  On CXR in the hospital she was incidentally noted to have a moderate to large hiatal hernia.    INTERVAL HISTORY  Patient  Is here for fu of her Iron deficiency anemia and B12 deficiency. Denies anyovert GI bleeding. No abdominal pain or discomfort. Energy levels are good. Notes some mild constipation that has resolved with magnesium citrate. Recent UTI treated with ciprofloxacin.  MEDICAL HISTORY:  Past Medical History:  Diagnosis Date  . Atrial tachycardia ([place])   . HTN  (hypertension)   . Jaundice due to hepatitis    at age 16 or 26  . Transient global amnesia    AMS  . Tremors of nervous system    benign familial  Large Hiatal hernia HCAP 10/2015 Iron deficiency Anemia B12 deficiency.  SURGICAL HISTORY: Past Surgical History:  Procedure Laterality Date  . ABDOMINAL HERNIA REPAIR    . BACK SURGERY     years ago  . TONSILLECTOMY      SOCIAL HISTORY: Social History   Social History  . Marital status: Married    Spouse name: N/A  . Number of children: N/A  . Years of education: N/A   Occupational History  . Not on file.   Social History Main Topics  . Smoking status: Never Smoker  . Smokeless tobacco: Never Used     Comment: + prior 2nd hand exposure from spouse smoking  . Alcohol use No  . Drug use: No  . Sexual activity: No   Other Topics Concern  . Not on file   Social History Narrative  . No narrative on file    FAMILY HISTORY: Family History  Problem Relation Age of Onset  . Lung cancer Mother   . Parkinson's disease Mother   . Hypertension Mother   . Other Father        blood clot  . Arthritis Sister   . Other Sister        spinal  stenosis    ALLERGIES:  is allergic to aspirin; codeine; hydrochlorothiazide; penicillins; sulfonamide derivatives; levofloxacin; and macrodantin [nitrofurantoin].  MEDICATIONS:  Current Outpatient Prescriptions  Medication Sig Dispense Refill  . amLODipine (NORVASC) 10 MG tablet Take 1 tablet (10 mg total) by mouth daily. 90 tablet 3  . cloNIDine (CATAPRES) 0.2 MG tablet Pt takes 1 tablet in the morning and 1.5 mg at bedtime 225 tablet 3  . iron dextran complex in sodium chloride 0.9 % 500 mL Inject into the vein once.    . magnesium citrate SOLN Take 1 Bottle by mouth once.    . metoprolol tartrate (LOPRESSOR) 25 MG tablet TAKE 1/2 TABLET TWICE A DAY 90 tablet 2  . spironolactone (ALDACTONE) 50 MG tablet TAKE 1 TABLET DAILY 90 tablet 2   No current facility-administered  medications for this visit.     REVIEW OF SYSTEMS:    10 Point review of Systems was done is negative except as noted above.  PHYSICAL EXAMINATION: ECOG PERFORMANCE STATUS: 2 - Symptomatic, <50% confined to bed  . Vitals:   07/15/16 1325  BP: (!) 174/61  Pulse: 67  Resp: 18  Temp: 97.7 F (36.5 C)   Filed Weights   07/15/16 1325  Weight: 161 lb 12.8 oz (73.4 kg)   .Body mass index is 29.59 kg/m.  GENERAL:alert, in no acute distress and comfortable SKIN: skin color, texture, turgor are normal, no rashes or significant lesions EYES: normal, conjunctiva are pink and non-injected, sclera clear OROPHARYNX:no exudate, no erythema and lips, buccal mucosa, and tongue normal  NECK: supple, no JVD, thyroid normal size, non-tender, without nodularity LYMPH:  no palpable lymphadenopathy in the cervical, axillary or inguinal LUNGS: clear to auscultation with normal respiratory effort HEART: regular rate & rhythm,  no murmurs and no lower extremity edema ABDOMEN: abdomen soft, non-tender, normoactive bowel sounds  Musculoskeletal: no cyanosis of digits and no clubbing  PSYCH: alert & oriented x 3 with fluent speech NEURO: no focal motor/sensory deficits  LABORATORY DATA:  I have reviewed the data as listed  . CBC Latest Ref Rng & Units 07/15/2016 05/08/2016 03/13/2016  WBC 3.9 - 10.3 10e3/uL 7.6 7.7 6.4  Hemoglobin 11.6 - 15.9 g/dL 12.9 13.3 9.8(L)  Hematocrit 34.8 - 46.6 % 40.6 40.8 31.4(L)  Platelets 145 - 400 10e3/uL 413(H) 338 433(H)    . CMP Latest Ref Rng & Units 07/15/2016 03/13/2016 03/13/2016  Glucose 70 - 140 mg/dl 110 94 -  BUN 7.0 - 26.0 mg/dL 13.5 14.7 -  Creatinine 0.6 - 1.1 mg/dL 0.8 0.8 -  Sodium 136 - 145 mEq/L 140 139 -  Potassium 3.5 - 5.1 mEq/L 3.8 4.2 -  Chloride 101 - 111 mmol/L - - -  CO2 22 - 29 mEq/L 24 23 -  Calcium 8.4 - 10.4 mg/dL 9.8 9.5 -  Total Protein 6.4 - 8.3 g/dL 7.9 7.9 7.5  Total Bilirubin 0.20 - 1.20 mg/dL 0.34 0.30 -  Alkaline Phos 40 - 150  U/L 118 130 -  AST 5 - 34 U/L 16 13 -  ALT 0 - 55 U/L 10 11 -   Component     Latest Ref Rng & Units 05/08/2016  Parietal Cell Ab     0.0 - 20.0 Units 3.5  Intrinsic Factor Abs, Serum     0.0 - 1.1 AU/mL 0.9   . Lab Results  Component Value Date   IRON 36 (L) 07/15/2016   TIBC 469 (H) 07/15/2016   IRONPCTSAT 8 (L) 07/15/2016   (  Iron and TIBC)  Lab Results  Component Value Date   FERRITIN 10 07/15/2016         RADIOGRAPHIC STUDIES: I have personally reviewed the radiological images as listed and agreed with the findings in the report. No results found.  ASSESSMENT & PLAN:   83 yo caucasian female with   1) Severe Microcytic Anemia s/p PRBc transfusion in sept 2017.  This appears to be likely related to severe iron deficiency  2) Severe Iron deficiency No overt source of bleeding Statistically more likely to be due to slow ongoing GI losses ?HH with cameron ulcers vs AVMs vs diverticular bleeding vs PUD vs tumors  Discussed this understanding with patient and she is definitively opposed to any idea of a GI workup Much improved with IV Iron . Hgb today is down from 13.3 and  is still okay at 12.9 but the patient's ferritin level has dropped to 10 with an iron saturation of 8%. 3) B12 deficiency - antiparietal cell and anti IF ab neg  PLAN -patient's anemia is still stable however her ferritin levels have dropped. -We will are offered her additional IV injectafer weekly x 2 doses  -will pursue prn IV Iron replacement to maintain ferritin >=100. -current ferritin close to 100...wil monitor. -continue vit B complex 1 tab po daily to support accelerated hematopoiesis. -she was encouraged to pursue a GI workup with her primary care physician if she does change her mind. 4) . Patient Active Problem List   Diagnosis Date Noted  . HCAP (healthcare-associated pneumonia) 10/31/2015  . Iron deficiency anemia 10/20/2015  . B12 deficiency anemia 10/20/2015  . Symptomatic  anemia 10/19/2015  . UTI (urinary tract infection) 07/26/2014  . TGA (transient global amnesia) 07/25/2014  . Cardiac dysrhythmia 02/06/2010  . PALPITATIONS 02/06/2010  . CHICKENPOX, HX OF 09/07/2008  . Essential hypertension, benign 05/22/2008  . PSVT 03/27/2008  . OBESITY, UNSPECIFIED 03/26/2008  . Abnormal involuntary movement 03/26/2008  . AMNESIA, TRANSIENT GLOBAL 01/10/2007   -f/u with PCP for management of her other medical co-morbids  RTC with Dr Irene Limbo in 4 months with labs  All of the patients questions were answered with apparent satisfaction. The patient knows to call the clinic with any problems, questions or concerns.  I spent 20 minutes counseling the patient face to face. The total time spent in the appointment was 20 minutes and more than 50% was on counseling and direct patient cares.    Sullivan Lone MD [place] AAHIVMS [facility] Hematology/Oncology Physician [facility]  (Office):       [phone] (Work cell):  [phone] (Fax):           [phone]

## 2016-09-15 ENCOUNTER — Telehealth: Payer: Self-pay | Admitting: *Deleted

## 2016-09-15 ENCOUNTER — Telehealth: Payer: Self-pay

## 2016-09-15 ENCOUNTER — Telehealth: Payer: Self-pay | Admitting: Hematology

## 2016-09-15 NOTE — Telephone Encounter (Signed)
Per Dr. Irene Limbo, sw pt regarding low iron levels.  IV iron suggested weekly x 2.  Pt agreeable.  Scheduling message sent.

## 2016-09-15 NOTE — Telephone Encounter (Signed)
Called patient and scheduled two iron infusions for the next two weeks.

## 2016-09-15 NOTE — Telephone Encounter (Signed)
Husband called asking that labs were drawn on June 5 and Dr Irene Limbo called today about scheduling 2 iron appts.  What precipitated the call today, 2 months is a long time to respond to a blood draw?  Last week pt was tired and lacking stamina, husband was going to call and discuss with Dr Irene Limbo. Before he did call,  these 2 infusions were scheduled.

## 2016-09-16 ENCOUNTER — Other Ambulatory Visit: Payer: Self-pay | Admitting: Cardiology

## 2016-09-17 NOTE — Telephone Encounter (Signed)
Rx(s) sent to pharmacy electronically.  

## 2016-09-18 ENCOUNTER — Ambulatory Visit (HOSPITAL_BASED_OUTPATIENT_CLINIC_OR_DEPARTMENT_OTHER): Payer: MEDICARE

## 2016-09-18 ENCOUNTER — Other Ambulatory Visit: Payer: Self-pay | Admitting: Hematology

## 2016-09-18 ENCOUNTER — Telehealth: Payer: Self-pay | Admitting: Hematology

## 2016-09-18 VITALS — BP 122/52 | HR 59 | Temp 98.1°F | Resp 16

## 2016-09-18 DIAGNOSIS — D500 Iron deficiency anemia secondary to blood loss (chronic): Secondary | ICD-10-CM

## 2016-09-18 DIAGNOSIS — D509 Iron deficiency anemia, unspecified: Secondary | ICD-10-CM

## 2016-09-18 LAB — CBC & DIFF AND RETIC
BASO%: 0.2 % (ref 0.0–2.0)
Basophils Absolute: 0 10*3/uL (ref 0.0–0.1)
EOS ABS: 0.1 10*3/uL (ref 0.0–0.5)
EOS%: 1.4 % (ref 0.0–7.0)
HEMATOCRIT: 31.7 % — AB (ref 34.8–46.6)
HGB: 9.5 g/dL — ABNORMAL LOW (ref 11.6–15.9)
IMMATURE RETIC FRACT: 13.1 % — AB (ref 1.60–10.00)
LYMPH#: 1.8 10*3/uL (ref 0.9–3.3)
LYMPH%: 19.1 % (ref 14.0–49.7)
MCH: 22.3 pg — AB (ref 25.1–34.0)
MCHC: 30 g/dL — ABNORMAL LOW (ref 31.5–36.0)
MCV: 74.4 fL — ABNORMAL LOW (ref 79.5–101.0)
MONO#: 0.7 10*3/uL (ref 0.1–0.9)
MONO%: 7.4 % (ref 0.0–14.0)
NEUT%: 71.9 % (ref 38.4–76.8)
NEUTROS ABS: 6.8 10*3/uL — AB (ref 1.5–6.5)
PLATELETS: 436 10*3/uL — AB (ref 145–400)
RBC: 4.26 10*6/uL (ref 3.70–5.45)
RDW: 15.1 % — AB (ref 11.2–14.5)
RETIC %: 1.54 % (ref 0.70–2.10)
Retic Ct Abs: 65.6 10*3/uL (ref 33.70–90.70)
WBC: 9.4 10*3/uL (ref 3.9–10.3)
nRBC: 0 % (ref 0–0)

## 2016-09-18 LAB — FERRITIN

## 2016-09-18 LAB — IRON AND TIBC
%SAT: 4 % — AB (ref 21–57)
Iron: 18 ug/dL — ABNORMAL LOW (ref 41–142)
TIBC: 491 ug/dL — AB (ref 236–444)
UIBC: 474 ug/dL — AB (ref 120–384)

## 2016-09-18 MED ORDER — ACETAMINOPHEN 325 MG PO TABS
650.0000 mg | ORAL_TABLET | Freq: Once | ORAL | Status: AC
  Administered 2016-09-18: 650 mg via ORAL

## 2016-09-18 MED ORDER — DIPHENHYDRAMINE HCL 25 MG PO CAPS
ORAL_CAPSULE | ORAL | Status: AC
  Filled 2016-09-18: qty 1

## 2016-09-18 MED ORDER — FERRIC CARBOXYMALTOSE 750 MG/15ML IV SOLN
750.0000 mg | Freq: Once | INTRAVENOUS | Status: AC
  Administered 2016-09-18: 750 mg via INTRAVENOUS
  Filled 2016-09-18: qty 15

## 2016-09-18 MED ORDER — ACETAMINOPHEN 325 MG PO TABS
ORAL_TABLET | ORAL | Status: AC
  Filled 2016-09-18: qty 2

## 2016-09-18 MED ORDER — DIPHENHYDRAMINE HCL 25 MG PO TABS
25.0000 mg | ORAL_TABLET | Freq: Once | ORAL | Status: AC
  Administered 2016-09-18: 25 mg via ORAL
  Filled 2016-09-18: qty 1

## 2016-09-18 NOTE — Patient Instructions (Signed)
Ferric carboxymaltose injection What is this medicine? FERRIC CARBOXYMALTOSE (ferr-ik car-box-ee-mol-toes) is an iron complex. Iron is used to make healthy red blood cells, which carry oxygen and nutrients throughout the body. This medicine is used to treat anemia in people with chronic kidney disease or people who cannot take iron by mouth. This medicine may be used for other purposes; ask your health care provider or pharmacist if you have questions. COMMON BRAND NAME(S): Injectafer What should I tell my health care provider before I take this medicine? They need to know if you have any of these conditions: -anemia not caused by low iron levels -high levels of iron in the blood -liver disease -an unusual or allergic reaction to iron, other medicines, foods, dyes, or preservatives -pregnant or trying to get pregnant -breast-feeding How should I use this medicine? This medicine is for infusion into a vein. It is given by a health care professional in a hospital or clinic setting. Talk to your pediatrician regarding the use of this medicine in children. Special care may be needed. Overdosage: If you think you have taken too much of this medicine contact a poison control center or emergency room at once. NOTE: This medicine is only for you. Do not share this medicine with others. What if I miss a dose? It is important not to miss your dose. Call your doctor or health care professional if you are unable to keep an appointment. What may interact with this medicine? Do not take this medicine with any of the following medications: -deferoxamine -dimercaprol -other iron products This medicine may also interact with the following medications: -chloramphenicol -deferasirox This list may not describe all possible interactions. Give your health care provider a list of all the medicines, herbs, non-prescription drugs, or dietary supplements you use. Also tell them if you smoke, drink alcohol, or use  illegal drugs. Some items may interact with your medicine. What should I watch for while using this medicine? Visit your doctor or health care professional regularly. Tell your doctor if your symptoms do not start to get better or if they get worse. You may need blood work done while you are taking this medicine. You may need to follow a special diet. Talk to your doctor. Foods that contain iron include: whole grains/cereals, dried fruits, beans, or peas, leafy green vegetables, and organ meats (liver, kidney). What side effects may I notice from receiving this medicine? Side effects that you should report to your doctor or health care professional as soon as possible: -allergic reactions like skin rash, itching or hives, swelling of the face, lips, or tongue -breathing problems -changes in blood pressure -feeling faint or lightheaded, falls -flushing, sweating, or hot feelings Side effects that usually do not require medical attention (report to your doctor or health care professional if they continue or are bothersome): -changes in taste -constipation -dizziness -headache -nausea -pain, redness, or irritation at site where injected -vomiting This list may not describe all possible side effects. Call your doctor for medical advice about side effects. You may report side effects to FDA at 1-800-FDA-1088. Where should I keep my medicine? This drug is given in a hospital or clinic and will not be stored at home. NOTE: This sheet is a summary. It may not cover all possible information. If you have questions about this medicine, talk to your doctor, pharmacist, or health care provider.  2018 Elsevier/Gold Standard (2015-03-01 11:20:47)  

## 2016-09-18 NOTE — Telephone Encounter (Signed)
I returned a call from Mr [name] [name] and his daughter regarding questions about rpt Iron infusion and answered all their questions to their apparent satisfaction.  Labs today prior to planned Iron infusion. RTC with me in 2 months after IV Iron.  Brunetta Genera  MD MS

## 2016-09-25 ENCOUNTER — Ambulatory Visit (HOSPITAL_BASED_OUTPATIENT_CLINIC_OR_DEPARTMENT_OTHER): Payer: MEDICARE

## 2016-09-25 VITALS — BP 148/58 | HR 59 | Temp 98.0°F | Resp 18

## 2016-09-25 DIAGNOSIS — D500 Iron deficiency anemia secondary to blood loss (chronic): Secondary | ICD-10-CM

## 2016-09-25 DIAGNOSIS — D509 Iron deficiency anemia, unspecified: Secondary | ICD-10-CM

## 2016-09-25 MED ORDER — SODIUM CHLORIDE 0.9 % IV SOLN
750.0000 mg | Freq: Once | INTRAVENOUS | Status: AC
  Administered 2016-09-25: 750 mg via INTRAVENOUS
  Filled 2016-09-25: qty 15

## 2016-09-25 MED ORDER — DIPHENHYDRAMINE HCL 25 MG PO CAPS
ORAL_CAPSULE | ORAL | Status: AC
  Filled 2016-09-25: qty 1

## 2016-09-25 MED ORDER — DIPHENHYDRAMINE HCL 25 MG PO TABS
25.0000 mg | ORAL_TABLET | Freq: Once | ORAL | Status: AC
  Administered 2016-09-25: 25 mg via ORAL
  Filled 2016-09-25: qty 1

## 2016-09-25 MED ORDER — ACETAMINOPHEN 325 MG PO TABS
ORAL_TABLET | ORAL | Status: AC
  Filled 2016-09-25: qty 2

## 2016-09-25 MED ORDER — ACETAMINOPHEN 325 MG PO TABS
650.0000 mg | ORAL_TABLET | Freq: Once | ORAL | Status: AC
  Administered 2016-09-25: 650 mg via ORAL

## 2016-09-25 NOTE — Patient Instructions (Signed)
Ferric carboxymaltose injection What is this medicine? FERRIC CARBOXYMALTOSE (ferr-ik car-box-ee-mol-toes) is an iron complex. Iron is used to make healthy red blood cells, which carry oxygen and nutrients throughout the body. This medicine is used to treat anemia in people with chronic kidney disease or people who cannot take iron by mouth. This medicine may be used for other purposes; ask your health care provider or pharmacist if you have questions. COMMON BRAND NAME(S): Injectafer What should I tell my health care provider before I take this medicine? They need to know if you have any of these conditions: -anemia not caused by low iron levels -high levels of iron in the blood -liver disease -an unusual or allergic reaction to iron, other medicines, foods, dyes, or preservatives -pregnant or trying to get pregnant -breast-feeding How should I use this medicine? This medicine is for infusion into a vein. It is given by a health care professional in a hospital or clinic setting. Talk to your pediatrician regarding the use of this medicine in children. Special care may be needed. Overdosage: If you think you have taken too much of this medicine contact a poison control center or emergency room at once. NOTE: This medicine is only for you. Do not share this medicine with others. What if I miss a dose? It is important not to miss your dose. Call your doctor or health care professional if you are unable to keep an appointment. What may interact with this medicine? Do not take this medicine with any of the following medications: -deferoxamine -dimercaprol -other iron products This medicine may also interact with the following medications: -chloramphenicol -deferasirox This list may not describe all possible interactions. Give your health care provider a list of all the medicines, herbs, non-prescription drugs, or dietary supplements you use. Also tell them if you smoke, drink alcohol, or use  illegal drugs. Some items may interact with your medicine. What should I watch for while using this medicine? Visit your doctor or health care professional regularly. Tell your doctor if your symptoms do not start to get better or if they get worse. You may need blood work done while you are taking this medicine. You may need to follow a special diet. Talk to your doctor. Foods that contain iron include: whole grains/cereals, dried fruits, beans, or peas, leafy green vegetables, and organ meats (liver, kidney). What side effects may I notice from receiving this medicine? Side effects that you should report to your doctor or health care professional as soon as possible: -allergic reactions like skin rash, itching or hives, swelling of the face, lips, or tongue -breathing problems -changes in blood pressure -feeling faint or lightheaded, falls -flushing, sweating, or hot feelings Side effects that usually do not require medical attention (report to your doctor or health care professional if they continue or are bothersome): -changes in taste -constipation -dizziness -headache -nausea -pain, redness, or irritation at site where injected -vomiting This list may not describe all possible side effects. Call your doctor for medical advice about side effects. You may report side effects to FDA at 1-800-FDA-1088. Where should I keep my medicine? This drug is given in a hospital or clinic and will not be stored at home. NOTE: This sheet is a summary. It may not cover all possible information. If you have questions about this medicine, talk to your doctor, pharmacist, or health care provider.  2018 Elsevier/Gold Standard (2015-03-01 11:20:47)  

## 2016-10-23 DIAGNOSIS — R350 Frequency of micturition: Secondary | ICD-10-CM | POA: Diagnosis not present

## 2016-10-23 DIAGNOSIS — D649 Anemia, unspecified: Secondary | ICD-10-CM | POA: Diagnosis not present

## 2016-10-23 DIAGNOSIS — B370 Candidal stomatitis: Secondary | ICD-10-CM | POA: Diagnosis not present

## 2016-10-23 DIAGNOSIS — R531 Weakness: Secondary | ICD-10-CM | POA: Diagnosis not present

## 2016-10-26 ENCOUNTER — Other Ambulatory Visit: Payer: Self-pay | Admitting: Cardiology

## 2016-10-31 ENCOUNTER — Ambulatory Visit (INDEPENDENT_AMBULATORY_CARE_PROVIDER_SITE_OTHER): Payer: MEDICARE | Admitting: Obstetrics & Gynecology

## 2016-10-31 ENCOUNTER — Encounter: Payer: Self-pay | Admitting: Obstetrics & Gynecology

## 2016-10-31 DIAGNOSIS — N814 Uterovaginal prolapse, unspecified: Secondary | ICD-10-CM

## 2016-10-31 DIAGNOSIS — N3090 Cystitis, unspecified without hematuria: Secondary | ICD-10-CM | POA: Diagnosis not present

## 2016-10-31 DIAGNOSIS — Z4689 Encounter for fitting and adjustment of other specified devices: Secondary | ICD-10-CM

## 2016-10-31 MED ORDER — NITROFURANTOIN MONOHYD MACRO 100 MG PO CAPS: 100.0000 mg | ORAL_CAPSULE | Freq: Every day | ORAL | 12 refills | Status: DC

## 2016-10-31 NOTE — Progress Notes (Signed)
    [name] [name] 07-Aug-1927 [id number]        83 y.o.  G4P4  Daughter present at visit  RP:  F/U pessary check  HPI:  Pessary well tolerated, good size.  No vaginal/pelvic pain.  Frequent recurrent UTIs.  Last episode patient was almost hospitalized.  Responded well to MacroBID.  Also had Dental work/Abcess on ABTx.  Developed trush on Nystatin mouth wash.  No UTI Sx currently.  No vaginal d/c.  No fever.  No SUI.  BMs treating constipation efficiently.  Past medical history,surgical history, problem list, medications, allergies, family history and social history were all reviewed and documented in the EPIC chart.  Directed ROS with pertinent positives and negatives documented in the history of present illness/assessment and plan.  Exam:  There were no vitals filed for this visit. General appearance:  Normal  Gyn exam:  Vulva normal                    Pessary easily removed and cleaned.  Milex ring with support #5.                    Bimanual exam:  Normal cervix and vaginal mucosa.  No erosion, no ulceration, no bleeding, normal secretions.                    Pessary put back in place, good fit.    Assessment/Plan:  83 y.o. G4P4   1. Pessary maintenance Very well tolerated.  Good fit.  Cleaned and put back in place.  2. Uterine prolapse As above.  3. Recurrent cystitis Given the severity of the symptoms and the frequency of occurrence, decision to start prophylaxis with MacroBID 100 mg PO daily.  Indication, benefits/risks reviewed.  Will call if develops a Yeast vaginitis infection.  Counseling on above issues >50% x 15 minutes.  Princess Bruins MD, 3:11 PM 10/31/2016

## 2016-10-31 NOTE — Patient Instructions (Signed)
  1. Pessary maintenance Very well tolerated.  Good fit.  Cleaned and put back in place.  2. Uterine prolapse As above.  3. Recurrent cystitis Given the severity of the symptoms and the frequency of occurrence, decision to start prophylaxis with MacroBID 100 mg PO daily.  Indication, benefits/risks reviewed.  Will call if develops a Yeast vaginitis infection.  [name] [name], it was good to see you today!

## 2016-11-11 NOTE — Progress Notes (Signed)
HEMATOLOGY/ONCOLOGY CLINIC NOTE  Date of Service: 11/12/16   Patient Care Team: Lawerance Cruel, MD as PCP - General  CHIEF COMPLAINTS/PURPOSE OF CONSULTATION:  Iron deficiency Anemia and B12 deficiency  HISTORY OF PRESENTING ILLNESS:   [name] [name] is a wonderful 83 y.o. female who has been referred to [place] by Dr .Harrington Challenger, Dwyane Luo, MD for evaluation and management of microcytic anemia.  Patient has a h/o HTN, atrial tachycardia, , benign tremor who was noted to have a new severe microcytic anemia and weakness with a drop of hgb down to 7.4 in sept 2017. She was admitted to the hospital and received 1 unit of PRBC. She was noted to have severe new iron deficiency and also noted to have B12 deficiency. Her stool occult blood was apparently neg. Was discharged on PO iron and was taking it for a few months. Rpt stool studies with PCP were again noted to be hemoccult neg. She received B12 IM x 1 in the hospital but has not been on any B12 since then. Patient had refused a GI workup in the hospital and was to be seen by Dr Fuller Plan for GI workup in the outpatient setting but has refused and continues to refuse and GI workup.  Denies any overt GI bleeding. No melena no hematemesis no hematochezia. No nausea/vomiting or abdominal pain. No significant weight loss recently.  Her hgb improved some on Po iron but then started dropping again and so she was referred to [place] for consideration of IV iron replacement.  On CXR in the hospital she was incidentally noted to have a moderate to large hiatal hernia.    INTERVAL HISTORY  Patient  Is here for fu of her Iron deficiency anemia and B12 deficiency. Pt presents to the office today accompanied by her husband. she reports that she is doing well overall. Husband voices concern that IV iron replacements are not helping iron levels for long periods of time. Pt reports no overt evidence of GI bleeding. We discussed the concern with ongoing blood  loss likely from GI tract. She states she does not want to get gastrointestinal workup at this time. She is agreeable to pursue an IV Iron as needed plan. She notes that her hgb was about 12.8 a few weeks ago with her PCP and is trending down to 11.4 today.  On review of systems, pt reports fatigue. She denies bloody stools, indigestion, or decreased appetite.    MEDICAL HISTORY:  Past Medical History:  Diagnosis Date  . Atrial tachycardia ([place])   . HTN (hypertension)   . Jaundice due to hepatitis    at age 62 or 37  . Transient global amnesia    AMS  . Tremors of nervous system    benign familial  Large Hiatal hernia HCAP 10/2015 Iron deficiency Anemia B12 deficiency.  SURGICAL HISTORY: Past Surgical History:  Procedure Laterality Date  . ABDOMINAL HERNIA REPAIR    . BACK SURGERY     years ago  . TONSILLECTOMY      SOCIAL HISTORY: Social History   Social History  . Marital status: Married    Spouse name: N/A  . Number of children: N/A  . Years of education: N/A   Occupational History  . Not on file.   Social History Main Topics  . Smoking status: Never Smoker  . Smokeless tobacco: Never Used     Comment: + prior 2nd hand exposure from spouse smoking  . Alcohol use No  .  Drug use: No  . Sexual activity: No   Other Topics Concern  . Not on file   Social History Narrative  . No narrative on file    FAMILY HISTORY: Family History  Problem Relation Age of Onset  . Lung cancer Mother   . Parkinson's disease Mother   . Hypertension Mother   . Other Father        blood clot  . Arthritis Sister   . Other Sister        spinal stenosis    ALLERGIES:  is allergic to aspirin; codeine; hydrochlorothiazide; penicillins; sulfonamide derivatives; levofloxacin; and macrodantin [nitrofurantoin].  MEDICATIONS:  Current Outpatient Prescriptions  Medication Sig Dispense Refill  . amLODipine (NORVASC) 10 MG tablet TAKE 1 TABLET DAILY 90 tablet 3  . cloNIDine  (CATAPRES) 0.2 MG tablet TAKE 1 TABLET EVERY MORNINGAND 1 AND 1/2 TABLETS AT   BEDTIME 225 tablet 3  . magnesium citrate SOLN Take 1 Bottle by mouth once.    . metoprolol tartrate (LOPRESSOR) 25 MG tablet TAKE 1/2 TABLET TWICE A DAY 90 tablet 2  . nitrofurantoin, macrocrystal-monohydrate, (MACROBID) 100 MG capsule Take 1 capsule (100 mg total) by mouth daily. Prevention of bladder infection 30 capsule 12  . spironolactone (ALDACTONE) 50 MG tablet TAKE 1 TABLET DAILY 90 tablet 2   No current facility-administered medications for this visit.     REVIEW OF SYSTEMS:    10 Point review of Systems was done is negative except as noted above.  PHYSICAL EXAMINATION:  ECOG PERFORMANCE STATUS: 2 - Symptomatic, <50% confined to bed  . Vitals:   11/12/16 1336  BP: (!) 142/57  Pulse: (!) 57  Resp: 19  Temp: 98.1 F (36.7 C)  SpO2: 98%   Filed Weights   11/12/16 1336  Weight: 161 lb 6.4 oz (73.2 kg)   .Body mass index is 29.52 kg/m.  GENERAL:alert, in no acute distress and comfortable SKIN: skin color, texture, turgor are normal, no rashes or significant lesions EYES: normal, conjunctiva are pink and non-injected, sclera clear OROPHARYNX:no exudate, no erythema and lips, buccal mucosa, and tongue normal  NECK: supple, no JVD, thyroid normal size, non-tender, without nodularity LYMPH:  no palpable lymphadenopathy in the cervical, axillary or inguinal LUNGS: clear to auscultation with normal respiratory effort HEART: regular rate & rhythm,  no murmurs and no lower extremity edema ABDOMEN: abdomen soft, non-tender, normoactive bowel sounds  Musculoskeletal: no cyanosis of digits and no clubbing  PSYCH: alert & oriented x 3 with fluent speech NEURO: no focal motor/sensory deficits  LABORATORY DATA:  I have reviewed the data as listed  . CBC Latest Ref Rng & Units 11/12/2016 09/18/2016 07/15/2016  WBC 3.9 - 10.3 10e3/uL 5.1 9.4 7.6  Hemoglobin 11.6 - 15.9 g/dL 11.4(L) 9.5(L) 12.9    Hematocrit 34.8 - 46.6 % 36.1 31.7(L) 40.6  Platelets 145 - 400 10e3/uL 300 436(H) 413(H)    . CMP Latest Ref Rng & Units 11/12/2016 07/15/2016 03/13/2016  Glucose 70 - 140 mg/dl 118 110 94  BUN 7.0 - 26.0 mg/dL 16.5 13.5 14.7  Creatinine 0.6 - 1.1 mg/dL 1.0 0.8 0.8  Sodium 136 - 145 mEq/L 139 140 139  Potassium 3.5 - 5.1 mEq/L 4.0 3.8 4.2  Chloride 101 - 111 mmol/L - - -  CO2 22 - 29 mEq/L 23 24 23   Calcium 8.4 - 10.4 mg/dL 9.0 9.8 9.5  Total Protein 6.4 - 8.3 g/dL 6.9 7.9 7.9  Total Bilirubin 0.20 - 1.20 mg/dL <0.22 0.34  0.30  Alkaline Phos 40 - 150 U/L 110 118 130  AST 5 - 34 U/L 14 16 13   ALT 0 - 55 U/L 8 10 11    Component     Latest Ref Rng & Units 05/08/2016  Parietal Cell Ab     0.0 - 20.0 Units 3.5  Intrinsic Factor Abs, Serum     0.0 - 1.1 AU/mL 0.9   . Lab Results  Component Value Date   IRON 27 (L) 11/12/2016   TIBC 341 11/12/2016   IRONPCTSAT 8 (L) 11/12/2016   (Iron and TIBC)  Lab Results  Component Value Date   FERRITIN 83 11/12/2016        RADIOGRAPHIC STUDIES: I have personally reviewed the radiological images as listed and agreed with the findings in the report. No results found.  ASSESSMENT & PLAN:   83 yo caucasian female with   1) Severe Microcytic Anemia s/p PRBc transfusion in sept 2017.  This appears to be likely related to severe iron deficiency  2) Severe Iron deficiency No overt source of bleeding Statistically more likely to be due to slow ongoing GI losses ?HH with cameron ulcers vs AVMs vs diverticular bleeding vs PUD vs tumors  Discussed this understanding with patient and she is definitively opposed to any idea of a GI workup again today. Hemoglobin is at 11.4 as of today 11/12/2016 better than on last visit but trending dow from 12.8 ( a couple of weeks ago with PCP -- per patients report) Ferritin improved but not a goal of ferritin >100.  3) B12 deficiency - antiparietal cell and anti IF ab neg. B12 levels adequate with  current replacement.  PLAN -Discussed and recommended that the pt be referred to a gastroenterologist for recommended GI workups for further evaluation and management, to which the pt declined at this time.  -will setup for additional IV Injectafer 750mg  x 1 doses since ferritin levels < 100.  (Tylenol/Benadryl- premedications) -previously advised the patient to continue vit B complex 1 tab po daily to support accelerated hematopoiesis.  4) . Patient Active Problem List   Diagnosis Date Noted  . HCAP (healthcare-associated pneumonia) 10/31/2015  . Iron deficiency anemia 10/20/2015  . B12 deficiency anemia 10/20/2015  . Symptomatic anemia 10/19/2015  . UTI (urinary tract infection) 07/26/2014  . TGA (transient global amnesia) 07/25/2014  . Cardiac dysrhythmia 02/06/2010  . PALPITATIONS 02/06/2010  . CHICKENPOX, HX OF 09/07/2008  . Essential hypertension, benign 05/22/2008  . PSVT 03/27/2008  . OBESITY, UNSPECIFIED 03/26/2008  . Abnormal involuntary movement 03/26/2008  . AMNESIA, TRANSIENT GLOBAL 01/10/2007   -advised continued f/u with PCP for management of her other medical co-morbids  Additional IV Injectafer x 1 doses in aout 1 week RTC with Dr Irene Limbo in 2 months with labs  All of the patients questions were answered with apparent satisfaction. The patient knows to call the clinic with any problems, questions or concerns.  I spent 20 minutes counseling the patient face to face. The total time spent in the appointment was 25 minutes and more than 50% was on counseling and direct patient cares.    Sullivan Lone MD [place] AAHIVMS [facility] Hematology/Oncology Physician [facility]  (Office):       [phone] (Work cell):  [phone] (Fax):           [phone]  This document serves as a record of services personally performed by Sullivan Lone, MD. It was created on her behalf by Alean Rinne, a  trained medical scribe. The creation of this record is based on the  scribe's personal observations and the provider's statements to them. This document has been checked and approved by the attending provider.

## 2016-11-12 ENCOUNTER — Telehealth: Payer: Self-pay | Admitting: Hematology

## 2016-11-12 ENCOUNTER — Ambulatory Visit (HOSPITAL_BASED_OUTPATIENT_CLINIC_OR_DEPARTMENT_OTHER): Payer: MEDICARE | Admitting: Hematology

## 2016-11-12 ENCOUNTER — Other Ambulatory Visit (HOSPITAL_BASED_OUTPATIENT_CLINIC_OR_DEPARTMENT_OTHER): Payer: MEDICARE

## 2016-11-12 ENCOUNTER — Encounter: Payer: Self-pay | Admitting: Hematology

## 2016-11-12 VITALS — BP 142/57 | HR 57 | Temp 98.1°F | Resp 19 | Ht 62.0 in | Wt 161.4 lb

## 2016-11-12 DIAGNOSIS — D500 Iron deficiency anemia secondary to blood loss (chronic): Secondary | ICD-10-CM

## 2016-11-12 DIAGNOSIS — E538 Deficiency of other specified B group vitamins: Secondary | ICD-10-CM

## 2016-11-12 DIAGNOSIS — D509 Iron deficiency anemia, unspecified: Secondary | ICD-10-CM

## 2016-11-12 DIAGNOSIS — R5383 Other fatigue: Secondary | ICD-10-CM

## 2016-11-12 DIAGNOSIS — E611 Iron deficiency: Secondary | ICD-10-CM

## 2016-11-12 LAB — CBC & DIFF AND RETIC
BASO%: 0.2 % (ref 0.0–2.0)
BASOS ABS: 0 10*3/uL (ref 0.0–0.1)
EOS ABS: 0.3 10*3/uL (ref 0.0–0.5)
EOS%: 5.7 % (ref 0.0–7.0)
HEMATOCRIT: 36.1 % (ref 34.8–46.6)
HEMOGLOBIN: 11.4 g/dL — AB (ref 11.6–15.9)
IMMATURE RETIC FRACT: 4.3 % (ref 1.60–10.00)
LYMPH#: 1.3 10*3/uL (ref 0.9–3.3)
LYMPH%: 26.4 % (ref 14.0–49.7)
MCH: 27.9 pg (ref 25.1–34.0)
MCHC: 31.6 g/dL (ref 31.5–36.0)
MCV: 88.5 fL (ref 79.5–101.0)
MONO#: 0.3 10*3/uL (ref 0.1–0.9)
MONO%: 6.5 % (ref 0.0–14.0)
NEUT#: 3.1 10*3/uL (ref 1.5–6.5)
NEUT%: 61.2 % (ref 38.4–76.8)
NRBC: 0 % (ref 0–0)
Platelets: 300 10*3/uL (ref 145–400)
RBC: 4.08 10*6/uL (ref 3.70–5.45)
RDW: 21.7 % — AB (ref 11.2–14.5)
RETIC %: 1.43 % (ref 0.70–2.10)
RETIC CT ABS: 58.34 10*3/uL (ref 33.70–90.70)
WBC: 5.1 10*3/uL (ref 3.9–10.3)

## 2016-11-12 LAB — COMPREHENSIVE METABOLIC PANEL
ALT: 8 U/L (ref 0–55)
ANION GAP: 8 meq/L (ref 3–11)
AST: 14 U/L (ref 5–34)
Albumin: 3.4 g/dL — ABNORMAL LOW (ref 3.5–5.0)
Alkaline Phosphatase: 110 U/L (ref 40–150)
BUN: 16.5 mg/dL (ref 7.0–26.0)
CALCIUM: 9 mg/dL (ref 8.4–10.4)
CHLORIDE: 108 meq/L (ref 98–109)
CO2: 23 mEq/L (ref 22–29)
CREATININE: 1 mg/dL (ref 0.6–1.1)
EGFR: 52 mL/min/{1.73_m2} — ABNORMAL LOW (ref >90)
Glucose: 118 mg/dl (ref 70–140)
POTASSIUM: 4 meq/L (ref 3.5–5.1)
Sodium: 139 mEq/L (ref 136–145)
Total Bilirubin: <0.22 mg/dL (ref 0.20–1.20)
Total Protein: 6.9 g/dL (ref 6.4–8.3)

## 2016-11-12 LAB — IRON AND TIBC
%SAT: 8 % — AB (ref 21–57)
IRON: 27 ug/dL — AB (ref 41–142)
TIBC: 341 ug/dL (ref 236–444)
UIBC: 315 ug/dL (ref 120–384)

## 2016-11-12 LAB — FERRITIN: FERRITIN: 83 ng/mL (ref 9–269)

## 2016-11-12 NOTE — Telephone Encounter (Signed)
Gave avs and calendar for December  °

## 2016-11-12 NOTE — Patient Instructions (Signed)
Thank you for choosing [facility] to provide your oncology and hematology care.  To afford each patient quality time with our providers, please arrive 30 minutes before your scheduled appointment time.  If you arrive late for your appointment, you may be asked to reschedule.  We strive to give you quality time with our providers, and arriving late affects you and other patients whose appointments are after yours.   If you are a no show for multiple scheduled visits, you may be dismissed from the clinic at the providers discretion.    Again, thank you for choosing [facility], our hope is that these requests will decrease the amount of time that you wait before being seen by our physicians.  ______________________________________________________________________  Should you have questions after your visit to the [facility], please contact our office at [phone] between the hours of 8:30 and 4:30 p.m.    Voicemails left after 4:30p.m will not be returned until the following business day.    For prescription refill requests, please have your pharmacy contact us directly.  Please also try to allow 48 hours for prescription requests.    Please contact the scheduling department for questions regarding scheduling.  For scheduling of procedures such as PET scans, CT scans, MRI, Ultrasound, etc please contact central scheduling at [phone].    Resources For Cancer Patients and Caregivers:   Oncolink.org:  A wonderful resource for patients and healthcare providers for information regarding your disease, ways to tract your treatment, what to expect, etc.     American Cancer Society:  800-227-2345  Can help patients locate various types of support and financial assistance  Cancer Care: 1-800-813-HOPE (4673) Provides financial assistance, online support groups, medication/co-pay assistance.    [place] DSS:  336-641-3447 Where to apply for food  stamps, Medicaid, and utility assistance  Medicare Rights Center: 800-333-4114 Helps people with Medicare understand their rights and benefits, navigate the Medicare system, and secure the quality healthcare they deserve  SCAT: 336-333-6589 [place] [employer]'s shared-ride transportation service for eligible riders who have a disability that prevents them from riding the fixed route bus.    For additional information on assistance programs please contact our social worker:   Grier Hock/Abigail Elmore:  [phone]            

## 2016-11-13 LAB — VITAMIN B12

## 2016-11-14 ENCOUNTER — Telehealth: Payer: Self-pay

## 2016-11-14 ENCOUNTER — Telehealth: Payer: Self-pay | Admitting: *Deleted

## 2016-11-14 NOTE — Telephone Encounter (Signed)
Called patient

## 2016-11-14 NOTE — Telephone Encounter (Signed)
Call patient with rescheduled date and time. Per 10/5 sch message

## 2016-11-14 NOTE — Telephone Encounter (Signed)
Per Dr. Irene Limbo, message left with pt informing her of low iron levels.  Pt should be scheduled for one dose injectafer next available.  High priority message sent to scheduling.  Call back number provided in message to patient for questions/concerns.

## 2016-11-15 ENCOUNTER — Ambulatory Visit: Payer: MEDICARE

## 2016-11-25 ENCOUNTER — Telehealth: Payer: Self-pay | Admitting: Hematology

## 2016-11-25 NOTE — Telephone Encounter (Signed)
Patients husband called in to confirm appointment tomorrow

## 2016-11-26 ENCOUNTER — Ambulatory Visit (HOSPITAL_BASED_OUTPATIENT_CLINIC_OR_DEPARTMENT_OTHER): Payer: MEDICARE

## 2016-11-26 VITALS — BP 120/57 | HR 53 | Temp 97.8°F | Resp 19

## 2016-11-26 DIAGNOSIS — D500 Iron deficiency anemia secondary to blood loss (chronic): Secondary | ICD-10-CM

## 2016-11-26 DIAGNOSIS — D509 Iron deficiency anemia, unspecified: Secondary | ICD-10-CM | POA: Diagnosis present

## 2016-11-26 MED ORDER — ACETAMINOPHEN 325 MG PO TABS
650.0000 mg | ORAL_TABLET | Freq: Once | ORAL | Status: AC
  Administered 2016-11-26: 650 mg via ORAL

## 2016-11-26 MED ORDER — ACETAMINOPHEN 325 MG PO TABS
ORAL_TABLET | ORAL | Status: AC
  Filled 2016-11-26: qty 2

## 2016-11-26 MED ORDER — SODIUM CHLORIDE 0.9 % IV SOLN
750.0000 mg | Freq: Once | INTRAVENOUS | Status: AC
  Administered 2016-11-26: 750 mg via INTRAVENOUS
  Filled 2016-11-26: qty 15

## 2016-11-26 MED ORDER — DIPHENHYDRAMINE HCL 25 MG PO CAPS
ORAL_CAPSULE | ORAL | Status: AC
Start: 2016-11-26 — End: 2016-11-26
  Filled 2016-11-26: qty 1

## 2016-11-26 MED ORDER — DIPHENHYDRAMINE HCL 25 MG PO TABS
25.0000 mg | ORAL_TABLET | Freq: Once | ORAL | Status: AC
  Administered 2016-11-26: 25 mg via ORAL
  Filled 2016-11-26: qty 1

## 2016-11-26 NOTE — Patient Instructions (Signed)
Ferric carboxymaltose injection What is this medicine? FERRIC CARBOXYMALTOSE (ferr-ik car-box-ee-mol-toes) is an iron complex. Iron is used to make healthy red blood cells, which carry oxygen and nutrients throughout the body. This medicine is used to treat anemia in people with chronic kidney disease or people who cannot take iron by mouth. This medicine may be used for other purposes; ask your health care provider or pharmacist if you have questions. COMMON BRAND NAME(S): Injectafer What should I tell my health care provider before I take this medicine? They need to know if you have any of these conditions: -anemia not caused by low iron levels -high levels of iron in the blood -liver disease -an unusual or allergic reaction to iron, other medicines, foods, dyes, or preservatives -pregnant or trying to get pregnant -breast-feeding How should I use this medicine? This medicine is for infusion into a vein. It is given by a health care professional in a hospital or clinic setting. Talk to your pediatrician regarding the use of this medicine in children. Special care may be needed. Overdosage: If you think you have taken too much of this medicine contact a poison control center or emergency room at once. NOTE: This medicine is only for you. Do not share this medicine with others. What if I miss a dose? It is important not to miss your dose. Call your doctor or health care professional if you are unable to keep an appointment. What may interact with this medicine? Do not take this medicine with any of the following medications: -deferoxamine -dimercaprol -other iron products This medicine may also interact with the following medications: -chloramphenicol -deferasirox This list may not describe all possible interactions. Give your health care provider a list of all the medicines, herbs, non-prescription drugs, or dietary supplements you use. Also tell them if you smoke, drink alcohol, or use  illegal drugs. Some items may interact with your medicine. What should I watch for while using this medicine? Visit your doctor or health care professional regularly. Tell your doctor if your symptoms do not start to get better or if they get worse. You may need blood work done while you are taking this medicine. You may need to follow a special diet. Talk to your doctor. Foods that contain iron include: whole grains/cereals, dried fruits, beans, or peas, leafy green vegetables, and organ meats (liver, kidney). What side effects may I notice from receiving this medicine? Side effects that you should report to your doctor or health care professional as soon as possible: -allergic reactions like skin rash, itching or hives, swelling of the face, lips, or tongue -breathing problems -changes in blood pressure -feeling faint or lightheaded, falls -flushing, sweating, or hot feelings Side effects that usually do not require medical attention (report to your doctor or health care professional if they continue or are bothersome): -changes in taste -constipation -dizziness -headache -nausea -pain, redness, or irritation at site where injected -vomiting This list may not describe all possible side effects. Call your doctor for medical advice about side effects. You may report side effects to FDA at 1-800-FDA-1088. Where should I keep my medicine? This drug is given in a hospital or clinic and will not be stored at home. NOTE: This sheet is a summary. It may not cover all possible information. If you have questions about this medicine, talk to your doctor, pharmacist, or health care provider.  2018 Elsevier/Gold Standard (2015-03-01 11:20:47)  

## 2016-12-29 ENCOUNTER — Telehealth: Payer: Self-pay | Admitting: *Deleted

## 2016-12-29 MED ORDER — NITROFURANTOIN MONOHYD MACRO 100 MG PO CAPS: 100.0000 mg | ORAL_CAPSULE | Freq: Every day | ORAL | 2 refills | Status: DC

## 2016-12-29 NOTE — Telephone Encounter (Signed)
Pharmacy requesting 90 day supply on Rx for Macrobid 100 prescribed on 10/31/16 uses per note to prevent bladder infection. New Rx sent for 90 day supply

## 2016-12-30 ENCOUNTER — Telehealth: Payer: Self-pay | Admitting: *Deleted

## 2016-12-30 NOTE — Telephone Encounter (Signed)
Prior authorization approved for Macrobid 100 mg 1 po daily due to recurrent cystitis. Pharmacy informed as well.

## 2017-01-07 ENCOUNTER — Telehealth: Payer: Self-pay | Admitting: *Deleted

## 2017-01-07 MED ORDER — NITROFURANTOIN MONOHYD MACRO 100 MG PO CAPS: 100.0000 mg | ORAL_CAPSULE | Freq: Every day | ORAL | 2 refills | Status: DC

## 2017-01-07 MED ORDER — NITROFURANTOIN MONOHYD MACRO 100 MG PO CAPS: 100.0000 mg | ORAL_CAPSULE | Freq: Every day | ORAL | 0 refills | Status: DC

## 2017-01-07 NOTE — Telephone Encounter (Signed)
Patient husband called stating pt is having a problem trying to refill Macrobid 100 mg capsule. I called [facility], who then switched me to [facility] [phone] as prior authorization was completed and approved on 12/30/16. Spoke with rep and was informed that the doctor's NPI was not active and I should try to resend Rx. I will re-send Rx and sent a 30 day supply of Macrobid to local pharmacy so pt will have medication as she only has 1 pill left. Patient husband aware as well.

## 2017-01-08 ENCOUNTER — Telehealth: Payer: Self-pay | Admitting: Hematology

## 2017-01-08 NOTE — Telephone Encounter (Signed)
[facility] - moved 12/3 appointments to 12/10. Spoke with patient she is aware.

## 2017-01-12 ENCOUNTER — Other Ambulatory Visit: Payer: MEDICARE

## 2017-01-12 ENCOUNTER — Ambulatory Visit: Payer: MEDICARE | Admitting: Hematology

## 2017-01-16 ENCOUNTER — Telehealth: Payer: Self-pay

## 2017-01-16 NOTE — Telephone Encounter (Signed)
Pt would like to reschedule. Scheduling message sent and appointments for 01/19/17 cancelled.

## 2017-01-17 ENCOUNTER — Telehealth: Payer: Self-pay | Admitting: Hematology

## 2017-01-17 NOTE — Telephone Encounter (Signed)
Called patient regarding appointment °

## 2017-01-19 ENCOUNTER — Other Ambulatory Visit: Payer: MEDICARE

## 2017-01-19 ENCOUNTER — Ambulatory Visit: Payer: MEDICARE | Admitting: Hematology

## 2017-01-26 ENCOUNTER — Ambulatory Visit: Payer: MEDICARE | Admitting: Obstetrics & Gynecology

## 2017-01-28 ENCOUNTER — Encounter: Payer: Self-pay | Admitting: Obstetrics & Gynecology

## 2017-01-28 ENCOUNTER — Ambulatory Visit (INDEPENDENT_AMBULATORY_CARE_PROVIDER_SITE_OTHER): Payer: MEDICARE | Admitting: Obstetrics & Gynecology

## 2017-01-28 VITALS — BP 140/80

## 2017-01-28 DIAGNOSIS — Z4689 Encounter for fitting and adjustment of other specified devices: Secondary | ICD-10-CM | POA: Diagnosis not present

## 2017-01-28 NOTE — Progress Notes (Signed)
    [name] [name] 1927-09-18 [id number]        83 y.o. G4P4  Daughter present at visit  RP:  F/U pessary check  HPI:  Pessary well tolerated, good size.  No vaginal/pelvic pain.  No recurrence of UTIs on Nitrofurantoin prophylaxis x last visit.  No yeast vaginitis on it.  No vaginal d/c.  No fever. No SUI.  BMs normal, treating constipation more efficiently with Mg++.  Past medical history,surgical history, problem list, medications, allergies, family history and social history were all reviewed and documented in the EPIC chart.  Directed ROS with pertinent positives and negatives documented in the history of present illness/assessment and plan.  Exam:  Vitals:   01/28/17 1156  BP: 140/80   General appearance:  Normal  Gyn exam: Vulva normal Pessary easily removed and cleaned.  Milex ring with support #5. Bimanual exam:  Normal cervix and vaginal mucosa. No erosion, no ulceration, no bleeding, normal secretions.  Had minimal blood on glove from stretching with removal of the pessary. Pessary put back in place, good fit.   Assessment/Plan:  82 y.o. G4P4   1. Encounter for pessary maintenance Very well tolerated.  Good fit.  Cleaned and put back in place.  Continue Nitrofurantoin prophylaxis, no recurrence of UTI on it.  No yeast vaginitis.   Princess Bruins MD, 12:19 PM 01/28/2017

## 2017-01-28 NOTE — Patient Instructions (Signed)
1. Encounter for pessary maintenance Very well tolerated.  Good fit.  Cleaned and put back in place.  Continue Nitrofurantoin prophylaxis, no recurrence of UTI on it.  No yeast vaginitis.  Dollene, it was good seeing you today!  Enjoy the [facility]!

## 2017-01-29 NOTE — Telephone Encounter (Signed)
Would you please sign this encounter

## 2017-02-04 NOTE — Telephone Encounter (Signed)
Close Encounter 

## 2017-02-17 NOTE — Progress Notes (Signed)
HEMATOLOGY/ONCOLOGY CLINIC NOTE  Date of Service: 02/17/17   Patient Care Team: Lawerance Cruel, MD as PCP - General  CHIEF COMPLAINTS/PURPOSE OF CONSULTATION:  Iron deficiency Anemia and B12 deficiency  HISTORY OF PRESENTING ILLNESS:   [name] [name] is a wonderful 84 y.o. female who has been referred to [place] by Dr .Harrington Challenger, Dwyane Luo, MD for evaluation and management of microcytic anemia.  Patient has a h/o HTN, atrial tachycardia, , benign tremor who was noted to have a new severe microcytic anemia and weakness with a drop of hgb down to 7.4 in sept 2017. She was admitted to the hospital and received 1 unit of PRBC. She was noted to have severe new iron deficiency and also noted to have B12 deficiency. Her stool occult blood was apparently neg. Was discharged on PO iron and was taking it for a few months. Rpt stool studies with PCP were again noted to be hemoccult neg. She received B12 IM x 1 in the hospital but has not been on any B12 since then. Patient had refused a GI workup in the hospital and was to be seen by Dr Fuller Plan for GI workup in the outpatient setting but has refused and continues to refuse and GI workup.  Denies any overt GI bleeding. No melena no hematemesis no hematochezia. No nausea/vomiting or abdominal pain. No significant weight loss recently.  Her hgb improved some on Po iron but then started dropping again and so she was referred to [place] for consideration of IV iron replacement.  On CXR in the hospital she was incidentally noted to have a moderate to large hiatal hernia.    INTERVAL HISTORY   [name] [name] is here for fu of her Iron deficiency anemia and B12 deficiency. Pt presents to the office today accompanied by her husband.   She notes no new changes since her last visit. She and her husband wonder where her Ferritin needs to be to not need treatment. She notes she does not want to do any GI workup if iron continues to drop. She takes magnesium  citrate often for her BMs. She is able to be active the way she wants given her age.  Labs today show her hgb is stable at 11.3 but ferritin is down to 11 with an iron saturation of 5%. On review of symptoms, pt notes her energy is fair given her age. She notes having swelling of her feet, but wears compression socks regularly. Her diet and appetite is well. She denies any bleeding, abdominal pain or trouble passing urine.     MEDICAL HISTORY:  Past Medical History:  Diagnosis Date  . Atrial tachycardia ([place])   . HTN (hypertension)   . Jaundice due to hepatitis    at age 89 or 54  . Transient global amnesia    AMS  . Tremors of nervous system    benign familial  Large Hiatal hernia HCAP 10/2015 Iron deficiency Anemia B12 deficiency.  SURGICAL HISTORY: Past Surgical History:  Procedure Laterality Date  . ABDOMINAL HERNIA REPAIR    . BACK SURGERY     years ago  . TONSILLECTOMY      SOCIAL HISTORY: Social History   Socioeconomic History  . Marital status: Married    Spouse name: Not on file  . Number of children: Not on file  . Years of education: Not on file  . Highest education level: Not on file  Social Needs  . Financial resource strain: Not  on file  . Food insecurity - worry: Not on file  . Food insecurity - inability: Not on file  . Transportation needs - medical: Not on file  . Transportation needs - non-medical: Not on file  Occupational History  . Not on file  Tobacco Use  . Smoking status: Never Smoker  . Smokeless tobacco: Never Used  . Tobacco comment: + prior 2nd hand exposure from spouse smoking  Substance and Sexual Activity  . Alcohol use: No    Alcohol/week: 0.0 oz  . Drug use: No  . Sexual activity: No  Other Topics Concern  . Not on file  Social History Narrative  . Not on file    FAMILY HISTORY: Family History  Problem Relation Age of Onset  . Lung cancer Mother   . Parkinson's disease Mother   . Hypertension Mother   . Other Father         blood clot  . Arthritis Sister   . Other Sister        spinal stenosis    ALLERGIES:  is allergic to aspirin; codeine; hydrochlorothiazide; penicillins; sulfonamide derivatives; levofloxacin; and macrodantin [nitrofurantoin].  MEDICATIONS:  Current Outpatient Medications  Medication Sig Dispense Refill  . amLODipine (NORVASC) 10 MG tablet TAKE 1 TABLET DAILY 90 tablet 3  . cloNIDine (CATAPRES) 0.2 MG tablet TAKE 1 TABLET EVERY MORNINGAND 1 AND 1/2 TABLETS AT   BEDTIME 225 tablet 3  . magnesium citrate SOLN Take 1 Bottle by mouth once.    . metoprolol tartrate (LOPRESSOR) 25 MG tablet TAKE 1/2 TABLET TWICE A DAY 90 tablet 2  . nitrofurantoin, macrocrystal-monohydrate, (MACROBID) 100 MG capsule Take 1 capsule (100 mg total) by mouth daily. Prevention of bladder infection 90 capsule 2  . nitrofurantoin, macrocrystal-monohydrate, (MACROBID) 100 MG capsule Take 1 capsule (100 mg total) by mouth at bedtime. 30 capsule 0  . spironolactone (ALDACTONE) 50 MG tablet TAKE 1 TABLET DAILY 90 tablet 2   No current facility-administered medications for this visit.     REVIEW OF SYSTEMS:    10 Point review of Systems was done is negative except as noted above.  PHYSICAL EXAMINATION:  ECOG PERFORMANCE STATUS: 2 - Symptomatic, <50% confined to bed  . There were no vitals filed for this visit. There were no vitals filed for this visit. .There is no height or weight on file to calculate BMI.  GENERAL:alert, in no acute distress and comfortable SKIN: skin color, texture, turgor are normal, no rashes or significant lesions EYES: normal, conjunctiva are pink and non-injected, sclera clear OROPHARYNX:no exudate, no erythema and lips, buccal mucosa, and tongue normal  NECK: supple, no JVD, thyroid normal size, non-tender, without nodularity LYMPH:  no palpable lymphadenopathy in the cervical, axillary or inguinal LUNGS: clear to auscultation with normal respiratory effort HEART: regular rate &  rhythm,  no murmurs (+) mild bilateral ankle swelling ABDOMEN: abdomen soft, non-tender, normoactive bowel sounds  Musculoskeletal: no cyanosis of digits and no clubbing  PSYCH: alert & oriented x 3 with fluent speech NEURO: no focal motor/sensory deficits  LABORATORY DATA:  I have reviewed the data as listed  . CBC Latest Ref Rng & Units 02/18/2017 11/12/2016 09/18/2016  WBC 3.9 - 10.3 10e3/uL - 5.1 9.4  Hemoglobin 11.6 - 15.9 g/dL - 11.4(L) 9.5(L)  Hematocrit 34.8 - 46.6 % 34.9 36.1 31.7(L)  Platelets 145 - 400 10e3/uL - 300 436(H)    . CMP Latest Ref Rng & Units 11/12/2016 07/15/2016 03/13/2016  Glucose 70 - 140  mg/dl 118 110 94  BUN 7.0 - 26.0 mg/dL 16.5 13.5 14.7  Creatinine 0.6 - 1.1 mg/dL 1.0 0.8 0.8  Sodium 136 - 145 mEq/L 139 140 139  Potassium 3.5 - 5.1 mEq/L 4.0 3.8 4.2  Chloride 101 - 111 mmol/L - - -  CO2 22 - 29 mEq/L 23 24 23   Calcium 8.4 - 10.4 mg/dL 9.0 9.8 9.5  Total Protein 6.4 - 8.3 g/dL 6.9 7.9 7.9  Total Bilirubin 0.20 - 1.20 mg/dL <0.22 0.34 0.30  Alkaline Phos 40 - 150 U/L 110 118 130  AST 5 - 34 U/L 14 16 13   ALT 0 - 55 U/L 8 10 11    Component     Latest Ref Rng & Units 02/18/2017  WBC Count     4.0 - 10.3 K/uL 5.9  RBC     3.70 - 5.45 MIL/uL 4.12  Hemoglobin     11.6 - 15.9 g/dL 11.3 (L)  HCT     34.8 - 46.6 % 34.9  MCV     79.5 - 101.0 fL 84.5  MCH     25.1 - 34.0 pg 27.3  MCHC     31.5 - 36.0 g/dL 32.4  RDW     11.2 - 16.1 % 13.3  Platelet Count     145 - 400 K/uL 364  Neutrophils     % 62  NEUT#     1.5 - 6.5 K/uL 3.6  Abs Granulocyte     1.5 - 6.5 K/uL 3.6  Lymphocytes     % 23  Lymphocyte #     0.9 - 3.3 K/uL 1.4  Monocytes Relative     % 10  Monocyte #     0.1 - 0.9 K/uL 0.6  Eosinophil     % 4  Eosinophils Absolute     0.0 - 0.5 K/uL 0.3  Basophil     % 1  Basophils Absolute     0.0 - 0.1 K/uL 0.0  Sodium     136 - 145 mmol/L 137  Potassium     3.3 - 4.7 mmol/L 4.1  Chloride     98 - 109 mmol/L 105  CO2     22 - 29  mmol/L 23  Glucose     70 - 140 mg/dL 148 (H)  BUN     7 - 26 mg/dL 17  Creatinine     0.60 - 1.10 mg/dL 0.82  Calcium     8.4 - 10.4 mg/dL 8.9  Total Protein     6.4 - 8.3 g/dL 7.0  Albumin     3.5 - 5.0 g/dL 3.5  AST     5 - 34 U/L 14  ALT     0 - 55 U/L 11  Alkaline Phosphatase     40 - 150 U/L 141  Total Bilirubin     0.2 - 1.2 mg/dL <0.2 (L)  GFR, Est Non African American     >60 mL/min >60  GFR, Est African American     >60 mL/min >60  Anion gap     3 - 11 9  Iron     41 - 142 ug/dL 23 (L)  TIBC     236 - 444 ug/dL 419  Saturation Ratios     21 - 57 % 5 (L)  UIBC     ug/dL 396  Vitamin B12     232 - 1245 pg/mL   Ferritin  9 - 269 ng/mL 11    Component     Latest Ref Rng & Units 05/08/2016  Parietal Cell Ab     0.0 - 20.0 Units 3.5  Intrinsic Factor Abs, Serum     0.0 - 1.1 AU/mL 0.9   . Lab Results  Component Value Date   IRON 27 (L) 11/12/2016   TIBC 341 11/12/2016   IRONPCTSAT 8 (L) 11/12/2016   (Iron and TIBC)  Lab Results  Component Value Date   FERRITIN 83 11/12/2016        RADIOGRAPHIC STUDIES: I have personally reviewed the radiological images as listed and agreed with the findings in the report. No results found.  ASSESSMENT & PLAN:   84 yo caucasian female with   1) Severe Microcytic Anemia s/p PRBc transfusion in sept 2017.  This appears to be likely related to severe iron deficiency  2) Severe Iron deficiency No overt source of bleeding Statistically more likely to be due to slow ongoing GI losses ?HH with cameron ulcers vs AVMs vs diverticular bleeding vs PUD vs tumors  Discussed this understanding with patient and she is definitively opposed to any idea of a GI workup at this time. Hemoglobin is stable at 11.3 as of today 02/18/17  However the ferritin has dropped to 11 with 5% iron saturation.  3) B12 deficiency - antiparietal cell and anti IF ab neg. B12 levels adequate with current  replacement.  PLAN -Previously discussed and recommended that the pt be referred to a gastroenterologist for recommended GI workups for further evaluation and management, to which the pt declined again at this time.  -will setup for additional IV Injectafer 750mg  x 2 doses due to significant drop in ferritin levels.  (Tylenol/Benadryl- premedications) -continue vit B complex 1 tab po daily to support accelerated hematopoiesis.  4) . Patient Active Problem List   Diagnosis Date Noted  . HCAP (healthcare-associated pneumonia) 10/31/2015  . Iron deficiency anemia 10/20/2015  . B12 deficiency anemia 10/20/2015  . Symptomatic anemia 10/19/2015  . UTI (urinary tract infection) 07/26/2014  . TGA (transient global amnesia) 07/25/2014  . Cardiac dysrhythmia 02/06/2010  . PALPITATIONS 02/06/2010  . CHICKENPOX, HX OF 09/07/2008  . Essential hypertension, benign 05/22/2008  . PSVT 03/27/2008  . OBESITY, UNSPECIFIED 03/26/2008  . Abnormal involuntary movement 03/26/2008  . AMNESIA, TRANSIENT GLOBAL 01/10/2007  -advised continued f/u with PCP for management of her other medical co-morbids  IV Injectafer weekly x 2 doses RTC with Dr Irene Limbo in 3 months with labs  All of the patients questions were answered with apparent satisfaction. The patient knows to call the clinic with any problems, questions or concerns.  I spent 20 minutes counseling the patient face to face. The total time spent in the appointment was 25 minutes and more than 50% was on counseling and direct patient cares.    Sullivan Lone MD [place] AAHIVMS [facility] Hematology/Oncology Physician [facility]  (Office):       [phone] (Work cell):  [phone] (Fax):           [phone]  This document serves as a record of services personally performed by Sullivan Lone, MD. It was created on his behalf by Joslyn Devon, a trained medical scribe. The creation of this record is based on the scribe's personal observations and  the provider's statements to them.    .I have reviewed the above documentation for accuracy and completeness, and I agree with the above. Brunetta Genera MD MS

## 2017-02-18 ENCOUNTER — Encounter: Payer: Self-pay | Admitting: Hematology

## 2017-02-18 ENCOUNTER — Inpatient Hospital Stay: Payer: MEDICARE | Attending: Hematology

## 2017-02-18 ENCOUNTER — Inpatient Hospital Stay (HOSPITAL_BASED_OUTPATIENT_CLINIC_OR_DEPARTMENT_OTHER): Payer: MEDICARE | Admitting: Hematology

## 2017-02-18 ENCOUNTER — Telehealth: Payer: Self-pay | Admitting: Hematology

## 2017-02-18 VITALS — BP 153/66 | HR 56 | Temp 97.7°F | Resp 18 | Ht 62.0 in | Wt 153.6 lb

## 2017-02-18 DIAGNOSIS — K449 Diaphragmatic hernia without obstruction or gangrene: Secondary | ICD-10-CM | POA: Insufficient documentation

## 2017-02-18 DIAGNOSIS — D500 Iron deficiency anemia secondary to blood loss (chronic): Secondary | ICD-10-CM

## 2017-02-18 DIAGNOSIS — M7989 Other specified soft tissue disorders: Secondary | ICD-10-CM

## 2017-02-18 DIAGNOSIS — R251 Tremor, unspecified: Secondary | ICD-10-CM | POA: Diagnosis not present

## 2017-02-18 DIAGNOSIS — Z87440 Personal history of urinary (tract) infections: Secondary | ICD-10-CM

## 2017-02-18 DIAGNOSIS — E538 Deficiency of other specified B group vitamins: Secondary | ICD-10-CM | POA: Insufficient documentation

## 2017-02-18 DIAGNOSIS — R4182 Altered mental status, unspecified: Secondary | ICD-10-CM | POA: Diagnosis not present

## 2017-02-18 DIAGNOSIS — Z8701 Personal history of pneumonia (recurrent): Secondary | ICD-10-CM | POA: Diagnosis not present

## 2017-02-18 DIAGNOSIS — R000 Tachycardia, unspecified: Secondary | ICD-10-CM | POA: Insufficient documentation

## 2017-02-18 DIAGNOSIS — I10 Essential (primary) hypertension: Secondary | ICD-10-CM | POA: Diagnosis not present

## 2017-02-18 DIAGNOSIS — R413 Other amnesia: Secondary | ICD-10-CM

## 2017-02-18 DIAGNOSIS — E669 Obesity, unspecified: Secondary | ICD-10-CM

## 2017-02-18 DIAGNOSIS — Z79899 Other long term (current) drug therapy: Secondary | ICD-10-CM | POA: Diagnosis not present

## 2017-02-18 DIAGNOSIS — Z9049 Acquired absence of other specified parts of digestive tract: Secondary | ICD-10-CM

## 2017-02-18 DIAGNOSIS — D509 Iron deficiency anemia, unspecified: Secondary | ICD-10-CM | POA: Diagnosis not present

## 2017-02-18 LAB — COMPREHENSIVE METABOLIC PANEL
ALT: 11 U/L (ref 0–55)
ANION GAP: 9 (ref 3–11)
AST: 14 U/L (ref 5–34)
Albumin: 3.5 g/dL (ref 3.5–5.0)
Alkaline Phosphatase: 141 U/L (ref 40–150)
BUN: 17 mg/dL (ref 7–26)
CALCIUM: 8.9 mg/dL (ref 8.4–10.4)
CO2: 23 mmol/L (ref 22–29)
Chloride: 105 mmol/L (ref 98–109)
Creatinine, Ser: 0.82 mg/dL (ref 0.60–1.10)
GLUCOSE: 148 mg/dL — AB (ref 70–140)
POTASSIUM: 4.1 mmol/L (ref 3.3–4.7)
Sodium: 137 mmol/L (ref 136–145)
TOTAL PROTEIN: 7 g/dL (ref 6.4–8.3)

## 2017-02-18 LAB — IRON AND TIBC
IRON: 23 ug/dL — AB (ref 41–142)
Saturation Ratios: 5 % — ABNORMAL LOW (ref 21–57)
TIBC: 419 ug/dL (ref 236–444)
UIBC: 396 ug/dL

## 2017-02-18 LAB — CBC WITH DIFFERENTIAL (CANCER CENTER ONLY)
Abs Granulocyte: 3.6 10*3/uL (ref 1.5–6.5)
BASOS PCT: 1 %
Basophils Absolute: 0 10*3/uL (ref 0.0–0.1)
Eosinophils Absolute: 0.3 10*3/uL (ref 0.0–0.5)
Eosinophils Relative: 4 %
HEMATOCRIT: 34.9 % (ref 34.8–46.6)
HEMOGLOBIN: 11.3 g/dL — AB (ref 11.6–15.9)
LYMPHS PCT: 23 %
Lymphs Abs: 1.4 10*3/uL (ref 0.9–3.3)
MCH: 27.3 pg (ref 25.1–34.0)
MCHC: 32.4 g/dL (ref 31.5–36.0)
MCV: 84.5 fL (ref 79.5–101.0)
MONO ABS: 0.6 10*3/uL (ref 0.1–0.9)
MONOS PCT: 10 %
NEUTROS ABS: 3.6 10*3/uL (ref 1.5–6.5)
Neutrophils Relative %: 62 %
Platelet Count: 364 10*3/uL (ref 145–400)
RBC: 4.12 MIL/uL (ref 3.70–5.45)
RDW: 13.3 % (ref 11.2–16.1)
WBC Count: 5.9 10*3/uL (ref 4.0–10.3)

## 2017-02-18 LAB — FERRITIN: FERRITIN: 11 ng/mL (ref 9–269)

## 2017-02-18 NOTE — Patient Instructions (Signed)
Thank you for choosing [facility] to provide your oncology and hematology care.  To afford each patient quality time with our providers, please arrive 30 minutes before your scheduled appointment time.  If you arrive late for your appointment, you may be asked to reschedule.  We strive to give you quality time with our providers, and arriving late affects you and other patients whose appointments are after yours.   If you are a no show for multiple scheduled visits, you may be dismissed from the clinic at the providers discretion.    Again, thank you for choosing [facility], our hope is that these requests will decrease the amount of time that you wait before being seen by our physicians.  ______________________________________________________________________  Should you have questions after your visit to the [facility], please contact our office at [phone] between the hours of 8:30 and 4:30 p.m.    Voicemails left after 4:30p.m will not be returned until the following business day.    For prescription refill requests, please have your pharmacy contact us directly.  Please also try to allow 48 hours for prescription requests.    Please contact the scheduling department for questions regarding scheduling.  For scheduling of procedures such as PET scans, CT scans, MRI, Ultrasound, etc please contact central scheduling at [phone].    Resources For Cancer Patients and Caregivers:   Oncolink.org:  A wonderful resource for patients and healthcare providers for information regarding your disease, ways to tract your treatment, what to expect, etc.     American Cancer Society:  800-227-2345  Can help patients locate various types of support and financial assistance  Cancer Care: 1-800-813-HOPE (4673) Provides financial assistance, online support groups, medication/co-pay assistance.    [place] DSS:  336-641-3447 Where to apply for food  stamps, Medicaid, and utility assistance  Medicare Rights Center: 800-333-4114 Helps people with Medicare understand their rights and benefits, navigate the Medicare system, and secure the quality healthcare they deserve  SCAT: 336-333-6589 [place] [employer]'s shared-ride transportation service for eligible riders who have a disability that prevents them from riding the fixed route bus.    For additional information on assistance programs please contact our social worker:   Grier Hock/Abigail Elmore:  [phone]            

## 2017-02-18 NOTE — Telephone Encounter (Signed)
Scheduled appt per 1/9 los - Gave patient AVS and calender per los.  

## 2017-02-23 ENCOUNTER — Telehealth: Payer: Self-pay

## 2017-02-23 NOTE — Telephone Encounter (Signed)
Spoke with pt regarding lab work and recommended treatment per Dr. Irene Limbo. Ferritin dropped from 83, 3 months ago, to 11, drawn last week. Dr. Irene Limbo would like for the pt to get two doses of IV injectafer to improve ferritin levels. Pt in agreement with plan. Scheduling message sent. Pt okay for VM to be left on mobile phone with appt dates and times.

## 2017-02-24 ENCOUNTER — Telehealth: Payer: Self-pay | Admitting: Hematology

## 2017-02-24 NOTE — Telephone Encounter (Signed)
Scheduled appt per 1/14 sch message - Patient is aware of appt date and time.

## 2017-03-04 ENCOUNTER — Inpatient Hospital Stay: Payer: MEDICARE

## 2017-03-04 VITALS — BP 112/50 | HR 55 | Temp 97.7°F | Resp 18

## 2017-03-04 DIAGNOSIS — R000 Tachycardia, unspecified: Secondary | ICD-10-CM | POA: Diagnosis not present

## 2017-03-04 DIAGNOSIS — D509 Iron deficiency anemia, unspecified: Secondary | ICD-10-CM | POA: Diagnosis not present

## 2017-03-04 DIAGNOSIS — D500 Iron deficiency anemia secondary to blood loss (chronic): Secondary | ICD-10-CM

## 2017-03-04 DIAGNOSIS — R251 Tremor, unspecified: Secondary | ICD-10-CM | POA: Diagnosis not present

## 2017-03-04 DIAGNOSIS — M7989 Other specified soft tissue disorders: Secondary | ICD-10-CM | POA: Diagnosis not present

## 2017-03-04 DIAGNOSIS — E538 Deficiency of other specified B group vitamins: Secondary | ICD-10-CM | POA: Diagnosis not present

## 2017-03-04 DIAGNOSIS — I10 Essential (primary) hypertension: Secondary | ICD-10-CM | POA: Diagnosis not present

## 2017-03-04 MED ORDER — SODIUM CHLORIDE 0.9 % IV SOLN
750.0000 mg | Freq: Once | INTRAVENOUS | Status: AC
  Administered 2017-03-04: 750 mg via INTRAVENOUS
  Filled 2017-03-04: qty 15

## 2017-03-04 MED ORDER — SODIUM CHLORIDE 0.9 % IV SOLN
INTRAVENOUS | Status: DC
  Administered 2017-03-04: 14:00:00 via INTRAVENOUS

## 2017-03-04 MED ORDER — DIPHENHYDRAMINE HCL 25 MG PO TABS
25.0000 mg | ORAL_TABLET | Freq: Once | ORAL | Status: AC
  Administered 2017-03-04: 25 mg via ORAL
  Filled 2017-03-04: qty 1

## 2017-03-04 MED ORDER — ACETAMINOPHEN 325 MG PO TABS
650.0000 mg | ORAL_TABLET | Freq: Once | ORAL | Status: AC
  Administered 2017-03-04: 650 mg via ORAL

## 2017-03-04 NOTE — Patient Instructions (Signed)
Ferric carboxymaltose injection What is this medicine? FERRIC CARBOXYMALTOSE (ferr-ik car-box-ee-mol-toes) is an iron complex. Iron is used to make healthy red blood cells, which carry oxygen and nutrients throughout the body. This medicine is used to treat anemia in people with chronic kidney disease or people who cannot take iron by mouth. This medicine may be used for other purposes; ask your health care provider or pharmacist if you have questions. COMMON BRAND NAME(S): Injectafer What should I tell my health care provider before I take this medicine? They need to know if you have any of these conditions: -anemia not caused by low iron levels -high levels of iron in the blood -liver disease -an unusual or allergic reaction to iron, other medicines, foods, dyes, or preservatives -pregnant or trying to get pregnant -breast-feeding How should I use this medicine? This medicine is for infusion into a vein. It is given by a health care professional in a hospital or clinic setting. Talk to your pediatrician regarding the use of this medicine in children. Special care may be needed. Overdosage: If you think you have taken too much of this medicine contact a poison control center or emergency room at once. NOTE: This medicine is only for you. Do not share this medicine with others. What if I miss a dose? It is important not to miss your dose. Call your doctor or health care professional if you are unable to keep an appointment. What may interact with this medicine? Do not take this medicine with any of the following medications: -deferoxamine -dimercaprol -other iron products This medicine may also interact with the following medications: -chloramphenicol -deferasirox This list may not describe all possible interactions. Give your health care provider a list of all the medicines, herbs, non-prescription drugs, or dietary supplements you use. Also tell them if you smoke, drink alcohol, or use  illegal drugs. Some items may interact with your medicine. What should I watch for while using this medicine? Visit your doctor or health care professional regularly. Tell your doctor if your symptoms do not start to get better or if they get worse. You may need blood work done while you are taking this medicine. You may need to follow a special diet. Talk to your doctor. Foods that contain iron include: whole grains/cereals, dried fruits, beans, or peas, leafy green vegetables, and organ meats (liver, kidney). What side effects may I notice from receiving this medicine? Side effects that you should report to your doctor or health care professional as soon as possible: -allergic reactions like skin rash, itching or hives, swelling of the face, lips, or tongue -breathing problems -changes in blood pressure -feeling faint or lightheaded, falls -flushing, sweating, or hot feelings Side effects that usually do not require medical attention (report to your doctor or health care professional if they continue or are bothersome): -changes in taste -constipation -dizziness -headache -nausea -pain, redness, or irritation at site where injected -vomiting This list may not describe all possible side effects. Call your doctor for medical advice about side effects. You may report side effects to FDA at 1-800-FDA-1088. Where should I keep my medicine? This drug is given in a hospital or clinic and will not be stored at home. NOTE: This sheet is a summary. It may not cover all possible information. If you have questions about this medicine, talk to your doctor, pharmacist, or health care provider.  2018 Elsevier/Gold Standard (2015-03-01 11:20:47)  

## 2017-03-11 ENCOUNTER — Inpatient Hospital Stay: Payer: MEDICARE

## 2017-03-11 VITALS — BP 134/53 | HR 59 | Temp 98.0°F | Resp 16

## 2017-03-11 DIAGNOSIS — R251 Tremor, unspecified: Secondary | ICD-10-CM | POA: Diagnosis not present

## 2017-03-11 DIAGNOSIS — E538 Deficiency of other specified B group vitamins: Secondary | ICD-10-CM | POA: Diagnosis not present

## 2017-03-11 DIAGNOSIS — R000 Tachycardia, unspecified: Secondary | ICD-10-CM | POA: Diagnosis not present

## 2017-03-11 DIAGNOSIS — D509 Iron deficiency anemia, unspecified: Secondary | ICD-10-CM | POA: Diagnosis not present

## 2017-03-11 DIAGNOSIS — M7989 Other specified soft tissue disorders: Secondary | ICD-10-CM | POA: Diagnosis not present

## 2017-03-11 DIAGNOSIS — I10 Essential (primary) hypertension: Secondary | ICD-10-CM | POA: Diagnosis not present

## 2017-03-11 DIAGNOSIS — D500 Iron deficiency anemia secondary to blood loss (chronic): Secondary | ICD-10-CM

## 2017-03-11 MED ORDER — SODIUM CHLORIDE 0.9 % IV SOLN
750.0000 mg | Freq: Once | INTRAVENOUS | Status: AC
  Administered 2017-03-11: 750 mg via INTRAVENOUS
  Filled 2017-03-11: qty 15

## 2017-03-11 MED ORDER — DIPHENHYDRAMINE HCL 25 MG PO TABS
25.0000 mg | ORAL_TABLET | Freq: Once | ORAL | Status: AC
  Administered 2017-03-11: 25 mg via ORAL
  Filled 2017-03-11: qty 1

## 2017-03-11 MED ORDER — ACETAMINOPHEN 325 MG PO TABS
650.0000 mg | ORAL_TABLET | Freq: Once | ORAL | Status: AC
  Administered 2017-03-11: 650 mg via ORAL

## 2017-03-11 NOTE — Telephone Encounter (Signed)
Close this encounter

## 2017-03-11 NOTE — Patient Instructions (Signed)
Ferric carboxymaltose injection What is this medicine? FERRIC CARBOXYMALTOSE (ferr-ik car-box-ee-mol-toes) is an iron complex. Iron is used to make healthy red blood cells, which carry oxygen and nutrients throughout the body. This medicine is used to treat anemia in people with chronic kidney disease or people who cannot take iron by mouth. This medicine may be used for other purposes; ask your health care provider or pharmacist if you have questions. COMMON BRAND NAME(S): Injectafer What should I tell my health care provider before I take this medicine? They need to know if you have any of these conditions: -anemia not caused by low iron levels -high levels of iron in the blood -liver disease -an unusual or allergic reaction to iron, other medicines, foods, dyes, or preservatives -pregnant or trying to get pregnant -breast-feeding How should I use this medicine? This medicine is for infusion into a vein. It is given by a health care professional in a hospital or clinic setting. Talk to your pediatrician regarding the use of this medicine in children. Special care may be needed. Overdosage: If you think you have taken too much of this medicine contact a poison control center or emergency room at once. NOTE: This medicine is only for you. Do not share this medicine with others. What if I miss a dose? It is important not to miss your dose. Call your doctor or health care professional if you are unable to keep an appointment. What may interact with this medicine? Do not take this medicine with any of the following medications: -deferoxamine -dimercaprol -other iron products This medicine may also interact with the following medications: -chloramphenicol -deferasirox This list may not describe all possible interactions. Give your health care provider a list of all the medicines, herbs, non-prescription drugs, or dietary supplements you use. Also tell them if you smoke, drink alcohol, or use  illegal drugs. Some items may interact with your medicine. What should I watch for while using this medicine? Visit your doctor or health care professional regularly. Tell your doctor if your symptoms do not start to get better or if they get worse. You may need blood work done while you are taking this medicine. You may need to follow a special diet. Talk to your doctor. Foods that contain iron include: whole grains/cereals, dried fruits, beans, or peas, leafy green vegetables, and organ meats (liver, kidney). What side effects may I notice from receiving this medicine? Side effects that you should report to your doctor or health care professional as soon as possible: -allergic reactions like skin rash, itching or hives, swelling of the face, lips, or tongue -breathing problems -changes in blood pressure -feeling faint or lightheaded, falls -flushing, sweating, or hot feelings Side effects that usually do not require medical attention (report to your doctor or health care professional if they continue or are bothersome): -changes in taste -constipation -dizziness -headache -nausea -pain, redness, or irritation at site where injected -vomiting This list may not describe all possible side effects. Call your doctor for medical advice about side effects. You may report side effects to FDA at 1-800-FDA-1088. Where should I keep my medicine? This drug is given in a hospital or clinic and will not be stored at home. NOTE: This sheet is a summary. It may not cover all possible information. If you have questions about this medicine, talk to your doctor, pharmacist, or health care provider.  2018 Elsevier/Gold Standard (2015-03-01 11:20:47)  

## 2017-04-22 ENCOUNTER — Encounter: Payer: Self-pay | Admitting: Obstetrics & Gynecology

## 2017-04-22 ENCOUNTER — Ambulatory Visit (INDEPENDENT_AMBULATORY_CARE_PROVIDER_SITE_OTHER): Payer: MEDICARE | Admitting: Obstetrics & Gynecology

## 2017-04-22 VITALS — BP 128/84 | Temp 98.1°F

## 2017-04-22 DIAGNOSIS — N3090 Cystitis, unspecified without hematuria: Secondary | ICD-10-CM

## 2017-04-22 DIAGNOSIS — N899 Noninflammatory disorder of vagina, unspecified: Secondary | ICD-10-CM

## 2017-04-22 DIAGNOSIS — N898 Other specified noninflammatory disorders of vagina: Secondary | ICD-10-CM

## 2017-04-22 MED ORDER — CIPROFLOXACIN HCL 250 MG PO TABS: 250.0000 mg | ORAL_TABLET | Freq: Two times a day (BID) | ORAL | 0 refills | Status: AC

## 2017-04-22 NOTE — Progress Notes (Signed)
    [name] [name] 1927/09/16 [id number]        84 y.o.  G4P4   RP:  Worsening pelvic pain with low back pain  HPI: Patient has a pessary with worries that it is the cause of her pain.  Vaginal discharge present.  No itching.  No vaginal bleeding.  Her urine has a strong odor.  She suffers from constipation.  No fever.   OB History  Gravida Para Term Preterm AB Living  4 4       4   SAB TAB Ectopic Multiple Live Births               # Outcome Date GA Lbr Len/2nd Weight Sex Delivery Anes PTL Lv  4 Para           3 Para           2 Para           1 Para               Past medical history,surgical history, problem list, medications, allergies, family history and social history were all reviewed and documented in the EPIC chart.   Directed ROS with pertinent positives and negatives documented in the history of present illness/assessment and plan.  Exam:  There were no vitals filed for this visit. General appearance:  Normal  Abdomen: Soft  CVAT neg bilaterally  Gynecologic exam: Vulva normal for menopause.  Pessary removed and cleaned.  Discharge yellow/brownish.  Speculum:  Erythema with mild ulceration of the vaginal wall.  Bimanual exam:  Uterus normal size, NT, mobile.  No adnexal mass felt, NT.  U/A: Cloudy, nitrites negative, bacteria many, red blood cells 3-10, white blood cells 20-40.   Assessment/Plan:  84 y.o. G4P4   1. Cystitis Probable cystitis.  Pending urine culture.  Will start on 82 ciprofloxacin.  Usage reviewed.  Prescription sent to pharmacy.  If no improvement in pelvic pain after removal of the pessary and treatment of cystitis, recommend consulting with family physician for further investigation including a CT scan.  2. Vaginal irritation from pessary Erythema and erosion due to pessary.  Pessary removed.  Will reevaluate and probably try a smaller one next visit in 2 weeks.  Other orders - ciprofloxacin (CIPRO) 250 MG tablet; Take 1 tablet (250 mg  total) by mouth 2 (two) times daily for 7 days.  Counseling and coordination of care for more than 50% of 15 minutes.  Princess Bruins MD, 2:40 PM 04/22/2017

## 2017-04-23 ENCOUNTER — Telehealth: Payer: Self-pay | Admitting: *Deleted

## 2017-04-23 ENCOUNTER — Ambulatory Visit: Payer: MEDICARE | Admitting: Obstetrics & Gynecology

## 2017-04-23 NOTE — Telephone Encounter (Signed)
Additional information from below, pt last BM was on 04/21/17

## 2017-04-23 NOTE — Telephone Encounter (Signed)
Pt daughter called Jacqlyn Larsen states pt is not doing any better with abdomen pain, was in office on 04/22/17 treated for UTI, had pessary removed as well. Daughter states pt has taken an enema and 2 suppositories and laxative  and still no bowel movement, no nausea, no vomiting or fever. States her mother is abdomen and back in hurting. I advised daughter this pain may not be gyn related and best to see care at PCP or ER if pain has worsened. Daughter states she is "worried" about her mom, but doesn't want to sit in ER for hours due to this. The daughter still asked me to run this information by you to get your recommendation, as the patient "trust" your input. Please advise

## 2017-04-24 LAB — URINALYSIS, COMPLETE W/RFL CULTURE
BILIRUBIN URINE: NEGATIVE
Glucose, UA: NEGATIVE
Hyaline Cast: NONE SEEN /LPF
NITRITES URINE, INITIAL: NEGATIVE
Protein, ur: NEGATIVE
SPECIFIC GRAVITY, URINE: 1.015 (ref 1.001–1.03)
pH: 5 (ref 5.0–8.0)

## 2017-04-24 LAB — URINE CULTURE
MICRO NUMBER:: 90324828
SPECIMEN QUALITY:: ADEQUATE

## 2017-04-24 LAB — CULTURE INDICATED

## 2017-04-24 NOTE — Telephone Encounter (Signed)
I called daughter Wells Guiles with result note from Dr. Marguerita Merles and informed her that urine culture negative. May be GI per Dr. Marguerita Merles and patient needs to see PCP today. Daughter informed. (Patient was in the shower.)

## 2017-04-24 NOTE — Telephone Encounter (Signed)
I agree with the fact that she needs to be seen by her Primary Care MD.  May need to investigate with a CT scan of abdomen and pelvis and Lab work.  Recommend calling the Office of her PCP to be worked in today.

## 2017-04-27 ENCOUNTER — Encounter: Payer: Self-pay | Admitting: Obstetrics & Gynecology

## 2017-04-27 DIAGNOSIS — K5641 Fecal impaction: Secondary | ICD-10-CM | POA: Diagnosis not present

## 2017-04-27 DIAGNOSIS — K5904 Chronic idiopathic constipation: Secondary | ICD-10-CM | POA: Insufficient documentation

## 2017-04-27 NOTE — Patient Instructions (Signed)
1. Cystitis Probable cystitis.  Pending urine culture.  Will start on ciprofloxacin.  Usage reviewed.  Prescription sent to pharmacy.  If no improvement in pelvic pain after removal of the pessary and treatment of cystitis, recommend consulting with family physician for further investigation including a CT scan.  2. Vaginal irritation from pessary Erythema and erosion due to pessary.  Pessary removed.  Will reevaluate and probably try a smaller one next visit in 2 weeks.  Other orders - ciprofloxacin (CIPRO) 250 MG tablet; Take 1 tablet (250 mg total) by mouth 2 (two) times daily for 7 days.  Justiss, always good seeing you!

## 2017-05-07 ENCOUNTER — Encounter: Payer: Self-pay | Admitting: Obstetrics & Gynecology

## 2017-05-07 ENCOUNTER — Ambulatory Visit (INDEPENDENT_AMBULATORY_CARE_PROVIDER_SITE_OTHER): Payer: MEDICARE | Admitting: Obstetrics & Gynecology

## 2017-05-07 VITALS — BP 132/70

## 2017-05-07 DIAGNOSIS — N899 Noninflammatory disorder of vagina, unspecified: Secondary | ICD-10-CM | POA: Diagnosis not present

## 2017-05-07 DIAGNOSIS — K5641 Fecal impaction: Secondary | ICD-10-CM | POA: Diagnosis not present

## 2017-05-07 DIAGNOSIS — N813 Complete uterovaginal prolapse: Secondary | ICD-10-CM

## 2017-05-07 DIAGNOSIS — N898 Other specified noninflammatory disorders of vagina: Secondary | ICD-10-CM

## 2017-05-07 MED ORDER — UNABLE TO FIND: 0 refills | Status: DC

## 2017-05-07 NOTE — Progress Notes (Signed)
    [name] [name] 1927-09-29 [id number]        84 y.o.  G4P4   RP: Vaginal ulcers d/t pessary  HPI: Pessary Milex Ring #5 with support removed on 04/22/2017 because of worsening pelvic pain.  At that visit, the vaginal mucosa was inflamed with a few ulcerations where the pessary was pressing.  The Urine Culture was negative.  The decision was therefore made not to place the pessary back in.  Since then, the patient had continued worsening pelvic and lower back pain.  No vaginal bleeding.  No discomfort from the bulging in the vagina and no SUI.  She was referred to Dr Earlean Shawl who found her impacted with stools and had a heme positive stool test.  It was difficult to clear the impacted stool, but she is now starting to pass BMs again and feels better.  Clobetasol cream on vulva and hemorrhoids prescribed by Dr Earlean Shawl is helping.  Will have a Colonoscopy with Dr Earlean Shawl to r/o Colon Ca.   OB History  Gravida Para Term Preterm AB Living  4 4       4   SAB TAB Ectopic Multiple Live Births               # Outcome Date GA Lbr Len/2nd Weight Sex Delivery Anes PTL Lv  4 Para           3 Para           2 Para           1 Para             Past medical history,surgical history, problem list, medications, allergies, family history and social history were all reviewed and documented in the EPIC chart.   Directed ROS with pertinent positives and negatives documented in the history of present illness/assessment and plan.  Exam:  Vitals:   05/07/17 1058  BP: 132/70   General appearance:  Normal  Abdomen: Soft  Gynecologic exam: Vulva normal with atrophy.  Speculum:  Improved vaginal mucosa, but 2 small ulcerations not completely healed.  Uterine prolapse grade 3/3.  Lowest point of the prolapse at the vulva is not inflamed, not ulcerated.   Assessment/Plan:  84 y.o. G4P4   1. Complete uterine prolapse Complete uterine prolapse only mildly symptomatic.  Patient is in no pain and the lowest  point of the prolapse is not inflamed or ulcerated.  In addition, the pressure points of the pessary where patient had inflammation and ulcerations are better but not completely healed.  Decision therefore made not to reinsert the pessary at this point.  2. Vaginal irritation from pessary Improving without the pessary in place.  Will keep the pessary out at this time and observe.  Patient will will call at any point if symptoms worsen and needs reinsertion of the pessary.  At that point, will fit a new smaller pessary in the hope of not creating irritation, inflammation and ulceration in the future.  3. Fecal impaction ([place]) Fecal impaction treated by Dr. Earlean Shawl.  Patient also had heme positive stools.  Dr. Thana Farr will complete the investigation with a colonoscopy to rule out colon cancer.  Other orders - UNABLE TO FIND; [facility] [employer]  Counseling on above issues and coordination of care more than 50% for 25 minutes.  Princess Bruins MD, 11:01 AM 05/07/2017

## 2017-05-08 ENCOUNTER — Encounter: Payer: Self-pay | Admitting: Obstetrics & Gynecology

## 2017-05-08 NOTE — Patient Instructions (Signed)
1. Complete uterine prolapse Complete uterine prolapse only mildly symptomatic.  Patient is in no pain and the lowest point of the prolapse is not inflamed or ulcerated.  In addition, the pressure points of the pessary where patient had inflammation and ulcerations are better but not completely healed.  Decision therefore made not to reinsert the pessary at this point.  2. Vaginal irritation from pessary Improving without the pessary in place.  Will keep the pessary out at this time and observe.  Patient will will call at any point if symptoms worsen and needs reinsertion of the pessary.  At that point, will fit a new smaller pessary in the hope of not creating irritation, inflammation and ulceration in the future.  3. Fecal impaction ([place]) Fecal impaction treated by Dr. Earlean Shawl.  Patient also had heme positive stools.  Dr. Thana Farr will complete the investigation with a colonoscopy to rule out colon cancer.  Other orders - UNABLE TO FIND; [employer] [employer], always good seeing you!

## 2017-05-14 ENCOUNTER — Ambulatory Visit: Payer: MEDICARE | Admitting: Obstetrics & Gynecology

## 2017-05-14 NOTE — Progress Notes (Signed)
HEMATOLOGY/ONCOLOGY CLINIC NOTE  Date of Service: 05/19/17   Patient Care Team: Lawerance Cruel, MD as PCP - General  CHIEF COMPLAINTS/PURPOSE OF CONSULTATION:  Iron deficiency Anemia and B12 deficiency  HISTORY OF PRESENTING ILLNESS:   [name] [name] is a wonderful 84 y.o. female who has been referred to [place] by Dr .Harrington Challenger, Dwyane Luo, MD for evaluation and management of microcytic anemia.  Patient has a h/o HTN, atrial tachycardia, , benign tremor who was noted to have a new severe microcytic anemia and weakness with a drop of hgb down to 7.4 in sept 2017. She was admitted to the hospital and received 1 unit of PRBC. She was noted to have severe new iron deficiency and also noted to have B12 deficiency. Her stool occult blood was apparently neg. Was discharged on PO iron and was taking it for a few months. Rpt stool studies with PCP were again noted to be hemoccult neg. She received B12 IM x 1 in the hospital but has not been on any B12 since then. Patient had refused a GI workup in the hospital and was to be seen by Dr Fuller Plan for GI workup in the outpatient setting but has refused and continues to refuse and GI workup.  Denies any overt GI bleeding. No melena no hematemesis no hematochezia. No nausea/vomiting or abdominal pain. No significant weight loss recently.  Her hgb improved some on Po iron but then started dropping again and so she was referred to [place] for consideration of IV iron replacement.  On CXR in the hospital she was incidentally noted to have a moderate to large hiatal hernia.    INTERVAL HISTORY   [name] [name] is here for fu of her Iron deficiency anemia and B12 deficiency. Pt presents to the office today accompanied by her husband.  She notes she has been having severe constipation. She did go to Dr. Earlean Shawl for GI. She was taking magnesium citrate. Dr. Earlean Shawl did a colon cleanse. She is to take a certain medication for a week. She is not sure of the name of  it. She plans to get a colonoscopy soon. Her husband notes his concern for her Hg levels. Today her Hg improved to 11.8 but she has had issues with ongoing drops in her ferritin levels suggesting ongoing GI losses. Ferritin is at 61 today with iron saturation of 14%  On review of symptoms, pt notes severe constipation, from possible bowel obstruction. She notes she has been having occasional constipation for some time now but recently this has gotten worse and prompted her to see GI after repeated counseling in the past.    MEDICAL HISTORY:  Past Medical History:  Diagnosis Date  . Atrial tachycardia ([place])   . HTN (hypertension)   . Jaundice due to hepatitis    at age 7 or 51  . Transient global amnesia    AMS  . Tremors of nervous system    benign familial  Large Hiatal hernia HCAP 10/2015 Iron deficiency Anemia B12 deficiency.  SURGICAL HISTORY: Past Surgical History:  Procedure Laterality Date  . ABDOMINAL HERNIA REPAIR    . BACK SURGERY     years ago  . TONSILLECTOMY      SOCIAL HISTORY: Social History   Socioeconomic History  . Marital status: Married    Spouse name: Not on file  . Number of children: Not on file  . Years of education: Not on file  . Highest education level: Not  on file  Occupational History  . Not on file  Social Needs  . Financial resource strain: Not on file  . Food insecurity:    Worry: Not on file    Inability: Not on file  . Transportation needs:    Medical: Not on file    Non-medical: Not on file  Tobacco Use  . Smoking status: Never Smoker  . Smokeless tobacco: Never Used  . Tobacco comment: + prior 2nd hand exposure from spouse smoking  Substance and Sexual Activity  . Alcohol use: No    Alcohol/week: 0.0 oz  . Drug use: No  . Sexual activity: Never  Lifestyle  . Physical activity:    Days per week: Not on file    Minutes per session: Not on file  . Stress: Not on file  Relationships  . Social connections:    Talks on  phone: Not on file    Gets together: Not on file    Attends religious service: Not on file    Active member of club or organization: Not on file    Attends meetings of clubs or organizations: Not on file    Relationship status: Not on file  . Intimate partner violence:    Fear of current or ex partner: Not on file    Emotionally abused: Not on file    Physically abused: Not on file    Forced sexual activity: Not on file  Other Topics Concern  . Not on file  Social History Narrative  . Not on file    FAMILY HISTORY: Family History  Problem Relation Age of Onset  . Lung cancer Mother   . Parkinson's disease Mother   . Hypertension Mother   . Other Father        blood clot  . Arthritis Sister   . Other Sister        spinal stenosis    ALLERGIES:  is allergic to aspirin; codeine; hydrochlorothiazide; penicillins; sulfonamide derivatives; levofloxacin; and macrodantin [nitrofurantoin].  MEDICATIONS:  Current Outpatient Medications  Medication Sig Dispense Refill  . amLODipine (NORVASC) 10 MG tablet TAKE 1 TABLET DAILY 90 tablet 3  . cloNIDine (CATAPRES) 0.2 MG tablet TAKE 1 TABLET EVERY MORNINGAND 1 AND 1/2 TABLETS AT   BEDTIME 225 tablet 3  . lubiprostone (AMITIZA) 8 MCG capsule Take 24 mcg by mouth 2 (two) times daily with a meal.    . magnesium citrate SOLN Take 1 Bottle by mouth as needed.     . metoprolol tartrate (LOPRESSOR) 25 MG tablet TAKE 1/2 TABLET TWICE A DAY 90 tablet 0  . nitrofurantoin, macrocrystal-monohydrate, (MACROBID) 100 MG capsule Take 1 capsule (100 mg total) by mouth daily. Prevention of bladder infection 90 capsule 2  . UNABLE TO FIND [employer]orn Stair Lift 1 Device 0   No current facility-administered medications for this visit.     REVIEW OF SYSTEMS:    Constipation - chronic but worsened recently. .10 Point review of Systems was done is negative except as noted above.  PHYSICAL EXAMINATION:  ECOG PERFORMANCE STATUS: 2 - Symptomatic, <50% confined  to bed  . Vitals:   05/19/17 1431  BP: (!) 142/65  Pulse: 67  Resp: 17  Temp: 97.9 F (36.6 C)  SpO2: 98%   Filed Weights   05/19/17 1431  Weight: 157 lb (71.2 kg)   .Body mass index is 28.72 kg/m. Marland Kitchen GENERAL:alert, in no acute distress and comfortable SKIN: no acute rashes, no significant lesions EYES: conjunctiva are pink  and non-injected, sclera anicteric OROPHARYNX: MMM, no exudates, no oropharyngeal erythema or ulceration NECK: supple, no JVD LYMPH:  no palpable lymphadenopathy in the cervical, axillary or inguinal regions LUNGS: clear to auscultation b/l with normal respiratory effort HEART: regular rate & rhythm ABDOMEN:  normoactive bowel sounds , non tender, not distended. Extremity: no pedal edema PSYCH: alert & oriented x 3 with fluent speech NEURO: no focal motor/sensory deficits  LABORATORY DATA:  I have reviewed the data as listed  . CBC Latest Ref Rng & Units 05/19/2017 02/18/2017 11/12/2016  WBC 3.9 - 10.3 K/uL 5.9 5.9 5.1  Hemoglobin 11.6 - 15.9 g/dL 11.8 11.3(L) 11.4(L)  Hematocrit 34.8 - 46.6 % 37.0 34.9 36.1  Platelets 145 - 400 K/uL 336 364 300    . CMP Latest Ref Rng & Units 05/19/2017 02/18/2017 11/12/2016  Glucose 70 - 140 mg/dL 154(H) 148(H) 118  BUN 7 - 26 mg/dL 13 17 16.5  Creatinine 0.60 - 1.10 mg/dL 0.76 0.82 1.0  Sodium 136 - 145 mmol/L 138 137 139  Potassium 3.5 - 5.1 mmol/L 3.9 4.1 4.0  Chloride 98 - 109 mmol/L 105 105 -  CO2 22 - 29 mmol/L 25 23 23   Calcium 8.4 - 10.4 mg/dL 8.9 8.9 9.0  Total Protein 6.4 - 8.3 g/dL 6.7 7.0 6.9  Total Bilirubin 0.2 - 1.2 mg/dL 0.2 <0.2(L) <0.22  Alkaline Phos 40 - 150 U/L 145 141 110  AST 5 - 34 U/L 13 14 14   ALT 0 - 55 U/L 10 11 8    . Lab Results  Component Value Date   IRON 48 05/19/2017   TIBC 350 05/19/2017   IRONPCTSAT 14 (L) 05/19/2017   (Iron and TIBC)  Lab Results  Component Value Date   FERRITIN 61 05/19/2017    Component     Latest Ref Rng & Units 05/08/2016  Parietal Cell Ab      0.0 - 20.0 Units 3.5  Intrinsic Factor Abs, Serum     0.0 - 1.1 AU/mL 0.9   . Lab Results  Component Value Date   IRON 48 05/19/2017   TIBC 350 05/19/2017   IRONPCTSAT 14 (L) 05/19/2017   (Iron and TIBC)  Lab Results  Component Value Date   FERRITIN 61 05/19/2017    B12 1674<----1444<---257    RADIOGRAPHIC STUDIES: I have personally reviewed the radiological images as listed and agreed with the findings in the report. No results found.  ASSESSMENT & PLAN:   84 yo caucasian female with   1) Severe Microcytic Anemia s/p PRBc transfusion in sept 2017.  This appears to be likely related to severe iron deficiency  2) Severe Iron deficiency No overt source of bleeding Statistically more likely to be due to slow ongoing GI losses ?HH with cameron ulcers vs AVMs vs diverticular bleeding vs PUD vs tumors  Have discussed this understanding with patient and she has previously been opposed to any idea of a GI workup but did eventually see Dr Earlean Shawl given concerns of worsening constipation. Hemoglobin is stable at 11.8 as of today 05/19/17 . Lab Results  Component Value Date   IRON 48 05/19/2017   TIBC 350 05/19/2017   IRONPCTSAT 14 (L) 05/19/2017   (Iron and TIBC)  Lab Results  Component Value Date   FERRITIN 61 05/19/2017   3) B12 deficiency - antiparietal cell and anti IF ab neg. B12 levels adequate with current replacement.   PLAN -I reviewed labs today, Hg improved to 11.8, iron saturation at 14  and Ferritin 61 -I discussed goal is to maintain Ferritin > 100 and iron saturation at 20%.  -Treatment would call for more aggressive intervention -- IV Injectafer x 1 doses (Tylenol/Benadryl- premedications) -Pt agreed to 1 dose of IV iron while she waits for her colonoscopy.  -Pt has been seen by GI Dr. Earlean Shawl. Plans to have colonoscopy soon.   -continue vit B complex 1 tab po daily to support accelerated hematopoiesis.  4) . Patient Active Problem List   Diagnosis  Date Noted  . HCAP (healthcare-associated pneumonia) 10/31/2015  . Iron deficiency anemia 10/20/2015  . B12 deficiency anemia 10/20/2015  . Symptomatic anemia 10/19/2015  . UTI (urinary tract infection) 07/26/2014  . TGA (transient global amnesia) 07/25/2014  . Cardiac dysrhythmia 02/06/2010  . PALPITATIONS 02/06/2010  . CHICKENPOX, HX OF 09/07/2008  . Essential hypertension, benign 05/22/2008  . PSVT 03/27/2008  . OBESITY, UNSPECIFIED 03/26/2008  . Abnormal involuntary movement 03/26/2008  . AMNESIA, TRANSIENT GLOBAL 01/10/2007  -advised continued f/u with PCP for management of her other medical co-morbids  IV injectafer x 1 in the next 1-2 weeks RTC with Dr Irene Limbo in 3 months with labs  All of the patients questions were answered with apparent satisfaction. The patient knows to call the clinic with any problems, questions or concerns.  . The total time spent in the appointment was 15 minutes and more than 50% was on counseling and direct patient cares.  Sullivan Lone MD [place] AAHIVMS [facility] Hematology/Oncology Physician [facility]  (Office):       [phone] (Work cell):  [phone] (Fax):           [phone]  This document serves as a record of services personally performed by Sullivan Lone, MD. It was created on his behalf by Joslyn Devon, a trained medical scribe. The creation of this record is based on the scribe's personal observations and the provider's statements to them.    .I have reviewed the above documentation for accuracy and completeness, and I agree with the above. Brunetta Genera MD MS

## 2017-05-18 ENCOUNTER — Other Ambulatory Visit: Payer: Self-pay | Admitting: Cardiology

## 2017-05-19 ENCOUNTER — Telehealth: Payer: Self-pay | Admitting: Hematology

## 2017-05-19 ENCOUNTER — Encounter: Payer: Self-pay | Admitting: Hematology

## 2017-05-19 ENCOUNTER — Inpatient Hospital Stay: Payer: MEDICARE

## 2017-05-19 ENCOUNTER — Inpatient Hospital Stay: Payer: MEDICARE | Attending: Hematology | Admitting: Hematology

## 2017-05-19 VITALS — BP 142/65 | HR 67 | Temp 97.9°F | Resp 17 | Ht 62.0 in | Wt 157.0 lb

## 2017-05-19 DIAGNOSIS — Z79899 Other long term (current) drug therapy: Secondary | ICD-10-CM | POA: Insufficient documentation

## 2017-05-19 DIAGNOSIS — I10 Essential (primary) hypertension: Secondary | ICD-10-CM | POA: Insufficient documentation

## 2017-05-19 DIAGNOSIS — R000 Tachycardia, unspecified: Secondary | ICD-10-CM | POA: Diagnosis not present

## 2017-05-19 DIAGNOSIS — K5900 Constipation, unspecified: Secondary | ICD-10-CM | POA: Diagnosis not present

## 2017-05-19 DIAGNOSIS — D509 Iron deficiency anemia, unspecified: Secondary | ICD-10-CM | POA: Insufficient documentation

## 2017-05-19 DIAGNOSIS — K449 Diaphragmatic hernia without obstruction or gangrene: Secondary | ICD-10-CM | POA: Insufficient documentation

## 2017-05-19 DIAGNOSIS — D500 Iron deficiency anemia secondary to blood loss (chronic): Secondary | ICD-10-CM | POA: Diagnosis not present

## 2017-05-19 DIAGNOSIS — Z801 Family history of malignant neoplasm of trachea, bronchus and lung: Secondary | ICD-10-CM | POA: Insufficient documentation

## 2017-05-19 DIAGNOSIS — E538 Deficiency of other specified B group vitamins: Secondary | ICD-10-CM | POA: Diagnosis not present

## 2017-05-19 DIAGNOSIS — R413 Other amnesia: Secondary | ICD-10-CM | POA: Diagnosis not present

## 2017-05-19 LAB — CBC WITH DIFFERENTIAL/PLATELET
BASOS ABS: 0 10*3/uL (ref 0.0–0.1)
BASOS PCT: 1 %
Eosinophils Absolute: 0.2 10*3/uL (ref 0.0–0.5)
Eosinophils Relative: 4 %
HEMATOCRIT: 37 % (ref 34.8–46.6)
HEMOGLOBIN: 11.8 g/dL (ref 11.6–15.9)
Lymphocytes Relative: 23 %
Lymphs Abs: 1.4 10*3/uL (ref 0.9–3.3)
MCH: 28.6 pg (ref 25.1–34.0)
MCHC: 31.9 g/dL (ref 31.5–36.0)
MCV: 89.6 fL (ref 79.5–101.0)
MONOS PCT: 10 %
Monocytes Absolute: 0.6 10*3/uL (ref 0.1–0.9)
NEUTROS ABS: 3.7 10*3/uL (ref 1.5–6.5)
NEUTROS PCT: 62 %
Platelets: 336 10*3/uL (ref 145–400)
RBC: 4.13 MIL/uL (ref 3.70–5.45)
RDW: 16.3 % — ABNORMAL HIGH (ref 11.2–14.5)
WBC: 5.9 10*3/uL (ref 3.9–10.3)

## 2017-05-19 LAB — RETICULOCYTES
RBC.: 4.13 MIL/uL (ref 3.70–5.45)
Retic Count, Absolute: 62 10*3/uL (ref 33.7–90.7)
Retic Ct Pct: 1.5 % (ref 0.7–2.1)

## 2017-05-19 LAB — FERRITIN: Ferritin: 61 ng/mL (ref 9–269)

## 2017-05-19 LAB — IRON AND TIBC
Iron: 48 ug/dL (ref 41–142)
SATURATION RATIOS: 14 % — AB (ref 21–57)
TIBC: 350 ug/dL (ref 236–444)
UIBC: 302 ug/dL

## 2017-05-19 LAB — CMP (CANCER CENTER ONLY)
ALK PHOS: 145 U/L (ref 40–150)
ALT: 10 U/L (ref 0–55)
ANION GAP: 8 (ref 3–11)
AST: 13 U/L (ref 5–34)
Albumin: 3.4 g/dL — ABNORMAL LOW (ref 3.5–5.0)
BUN: 13 mg/dL (ref 7–26)
CALCIUM: 8.9 mg/dL (ref 8.4–10.4)
CO2: 25 mmol/L (ref 22–29)
Chloride: 105 mmol/L (ref 98–109)
Creatinine: 0.76 mg/dL (ref 0.60–1.10)
GFR, Estimated: >60 mL/min (ref >60)
Glucose, Bld: 154 mg/dL — ABNORMAL HIGH (ref 70–140)
Potassium: 3.9 mmol/L (ref 3.5–5.1)
SODIUM: 138 mmol/L (ref 136–145)
Total Bilirubin: 0.2 mg/dL (ref 0.2–1.2)
Total Protein: 6.7 g/dL (ref 6.4–8.3)

## 2017-05-19 NOTE — Telephone Encounter (Signed)
Appts scheduled AVS/Calendar printed per 4/9 los

## 2017-05-19 NOTE — Telephone Encounter (Signed)
REFILL 

## 2017-05-29 DIAGNOSIS — K5900 Constipation, unspecified: Secondary | ICD-10-CM | POA: Diagnosis not present

## 2017-05-29 DIAGNOSIS — R195 Other fecal abnormalities: Secondary | ICD-10-CM | POA: Diagnosis not present

## 2017-06-01 ENCOUNTER — Inpatient Hospital Stay: Payer: MEDICARE

## 2017-06-01 VITALS — BP 120/62 | HR 55 | Temp 98.0°F | Resp 16

## 2017-06-01 DIAGNOSIS — D509 Iron deficiency anemia, unspecified: Secondary | ICD-10-CM | POA: Diagnosis not present

## 2017-06-01 DIAGNOSIS — R000 Tachycardia, unspecified: Secondary | ICD-10-CM | POA: Diagnosis not present

## 2017-06-01 DIAGNOSIS — I10 Essential (primary) hypertension: Secondary | ICD-10-CM | POA: Diagnosis not present

## 2017-06-01 DIAGNOSIS — K5900 Constipation, unspecified: Secondary | ICD-10-CM | POA: Diagnosis not present

## 2017-06-01 DIAGNOSIS — D500 Iron deficiency anemia secondary to blood loss (chronic): Secondary | ICD-10-CM

## 2017-06-01 DIAGNOSIS — E538 Deficiency of other specified B group vitamins: Secondary | ICD-10-CM | POA: Diagnosis not present

## 2017-06-01 DIAGNOSIS — Z79899 Other long term (current) drug therapy: Secondary | ICD-10-CM | POA: Diagnosis not present

## 2017-06-01 MED ORDER — SODIUM CHLORIDE 0.9 % IV SOLN
750.0000 mg | Freq: Once | INTRAVENOUS | Status: AC
  Administered 2017-06-01: 750 mg via INTRAVENOUS
  Filled 2017-06-01: qty 15

## 2017-06-01 MED ORDER — ACETAMINOPHEN 325 MG PO TABS
ORAL_TABLET | ORAL | Status: AC
  Filled 2017-06-01: qty 2

## 2017-06-01 MED ORDER — DIPHENHYDRAMINE HCL 25 MG PO TABS
25.0000 mg | ORAL_TABLET | Freq: Once | ORAL | Status: AC
  Administered 2017-06-01: 25 mg via ORAL
  Filled 2017-06-01: qty 1

## 2017-06-01 MED ORDER — DIPHENHYDRAMINE HCL 25 MG PO CAPS
ORAL_CAPSULE | ORAL | Status: AC
  Filled 2017-06-01: qty 1

## 2017-06-01 MED ORDER — ACETAMINOPHEN 325 MG PO TABS
650.0000 mg | ORAL_TABLET | Freq: Once | ORAL | Status: AC
  Administered 2017-06-01: 650 mg via ORAL

## 2017-06-01 NOTE — Patient Instructions (Signed)
Ferric carboxymaltose injection What is this medicine? FERRIC CARBOXYMALTOSE (ferr-ik car-box-ee-mol-toes) is an iron complex. Iron is used to make healthy red blood cells, which carry oxygen and nutrients throughout the body. This medicine is used to treat anemia in people with chronic kidney disease or people who cannot take iron by mouth. This medicine may be used for other purposes; ask your health care provider or pharmacist if you have questions. COMMON BRAND NAME(S): Injectafer What should I tell my health care provider before I take this medicine? They need to know if you have any of these conditions: -anemia not caused by low iron levels -high levels of iron in the blood -liver disease -an unusual or allergic reaction to iron, other medicines, foods, dyes, or preservatives -pregnant or trying to get pregnant -breast-feeding How should I use this medicine? This medicine is for infusion into a vein. It is given by a health care professional in a hospital or clinic setting. Talk to your pediatrician regarding the use of this medicine in children. Special care may be needed. Overdosage: If you think you have taken too much of this medicine contact a poison control center or emergency room at once. NOTE: This medicine is only for you. Do not share this medicine with others. What if I miss a dose? It is important not to miss your dose. Call your doctor or health care professional if you are unable to keep an appointment. What may interact with this medicine? Do not take this medicine with any of the following medications: -deferoxamine -dimercaprol -other iron products This medicine may also interact with the following medications: -chloramphenicol -deferasirox This list may not describe all possible interactions. Give your health care provider a list of all the medicines, herbs, non-prescription drugs, or dietary supplements you use. Also tell them if you smoke, drink alcohol, or use  illegal drugs. Some items may interact with your medicine. What should I watch for while using this medicine? Visit your doctor or health care professional regularly. Tell your doctor if your symptoms do not start to get better or if they get worse. You may need blood work done while you are taking this medicine. You may need to follow a special diet. Talk to your doctor. Foods that contain iron include: whole grains/cereals, dried fruits, beans, or peas, leafy green vegetables, and organ meats (liver, kidney). What side effects may I notice from receiving this medicine? Side effects that you should report to your doctor or health care professional as soon as possible: -allergic reactions like skin rash, itching or hives, swelling of the face, lips, or tongue -breathing problems -changes in blood pressure -feeling faint or lightheaded, falls -flushing, sweating, or hot feelings Side effects that usually do not require medical attention (report to your doctor or health care professional if they continue or are bothersome): -changes in taste -constipation -dizziness -headache -nausea -pain, redness, or irritation at site where injected -vomiting This list may not describe all possible side effects. Call your doctor for medical advice about side effects. You may report side effects to FDA at 1-800-FDA-1088. Where should I keep my medicine? This drug is given in a hospital or clinic and will not be stored at home. NOTE: This sheet is a summary. It may not cover all possible information. If you have questions about this medicine, talk to your doctor, pharmacist, or health care provider.  2018 Elsevier/Gold Standard (2015-03-01 11:20:47)  

## 2017-06-09 DIAGNOSIS — C180 Malignant neoplasm of cecum: Secondary | ICD-10-CM | POA: Diagnosis not present

## 2017-06-09 DIAGNOSIS — K648 Other hemorrhoids: Secondary | ICD-10-CM | POA: Diagnosis not present

## 2017-06-09 DIAGNOSIS — K5900 Constipation, unspecified: Secondary | ICD-10-CM | POA: Diagnosis not present

## 2017-06-09 DIAGNOSIS — D509 Iron deficiency anemia, unspecified: Secondary | ICD-10-CM | POA: Diagnosis not present

## 2017-06-09 DIAGNOSIS — Z1211 Encounter for screening for malignant neoplasm of colon: Secondary | ICD-10-CM | POA: Diagnosis not present

## 2017-06-09 DIAGNOSIS — R195 Other fecal abnormalities: Secondary | ICD-10-CM | POA: Diagnosis not present

## 2017-06-09 DIAGNOSIS — K5730 Diverticulosis of large intestine without perforation or abscess without bleeding: Secondary | ICD-10-CM | POA: Diagnosis not present

## 2017-06-11 ENCOUNTER — Telehealth: Payer: Self-pay | Admitting: *Deleted

## 2017-06-11 ENCOUNTER — Telehealth: Payer: Self-pay | Admitting: Medical

## 2017-06-11 ENCOUNTER — Other Ambulatory Visit: Payer: Self-pay | Admitting: Gastroenterology

## 2017-06-11 ENCOUNTER — Other Ambulatory Visit: Payer: Self-pay | Admitting: Medical

## 2017-06-11 ENCOUNTER — Other Ambulatory Visit: Payer: Self-pay | Admitting: Hematology

## 2017-06-11 ENCOUNTER — Telehealth: Payer: Self-pay

## 2017-06-11 DIAGNOSIS — D509 Iron deficiency anemia, unspecified: Secondary | ICD-10-CM

## 2017-06-11 NOTE — Telephone Encounter (Signed)
Received call from pt's daughter, [name] [name] stating that pt had a colonoscopy tues with Dr Earlean Shawl & was told that she has a tumor.  She wanted to make sure that Dr Irene Limbo had those results & they want to know what next.  Call pt at home [phone] or if unable to get, call Becky [phone].  Message routed to Dr Launa Flight RN

## 2017-06-11 NOTE — Telephone Encounter (Signed)
Called patient regarding 5/3

## 2017-06-11 NOTE — Telephone Encounter (Signed)
Received phone call from [facility] on Tuesday notifying Dr. 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 Pt daughter, Thayer Headings, called Triage concerned about imaging being performed at [facility] instead of [facility]. Spoke with Dr. Irene Limbo about ordering CT C/A/P, but he has not seen the pt recently and would prefer to Dr. Earlean Shawl to order at this time. Communicated with Gerald Stabs, assistant of Dr. Earlean Shawl and plan to transfer orders to [facility] and add chest CT to orders. Gerald Stabs to notify pt daughter, Thayer Headings, of plan to proceed with imaging.

## 2017-06-12 ENCOUNTER — Inpatient Hospital Stay: Payer: MEDICARE | Attending: Hematology

## 2017-06-12 ENCOUNTER — Other Ambulatory Visit: Payer: Self-pay | Admitting: Gastroenterology

## 2017-06-12 ENCOUNTER — Inpatient Hospital Stay (HOSPITAL_BASED_OUTPATIENT_CLINIC_OR_DEPARTMENT_OTHER): Payer: MEDICARE | Admitting: Medical

## 2017-06-12 VITALS — BP 172/68 | HR 76 | Temp 97.8°F | Resp 17 | Ht 62.0 in | Wt 155.3 lb

## 2017-06-12 DIAGNOSIS — R630 Anorexia: Secondary | ICD-10-CM | POA: Diagnosis not present

## 2017-06-12 DIAGNOSIS — Z801 Family history of malignant neoplasm of trachea, bronchus and lung: Secondary | ICD-10-CM | POA: Insufficient documentation

## 2017-06-12 DIAGNOSIS — D259 Leiomyoma of uterus, unspecified: Secondary | ICD-10-CM | POA: Diagnosis not present

## 2017-06-12 DIAGNOSIS — K449 Diaphragmatic hernia without obstruction or gangrene: Secondary | ICD-10-CM | POA: Diagnosis not present

## 2017-06-12 DIAGNOSIS — I700 Atherosclerosis of aorta: Secondary | ICD-10-CM | POA: Diagnosis not present

## 2017-06-12 DIAGNOSIS — R251 Tremor, unspecified: Secondary | ICD-10-CM | POA: Insufficient documentation

## 2017-06-12 DIAGNOSIS — G454 Transient global amnesia: Secondary | ICD-10-CM | POA: Diagnosis not present

## 2017-06-12 DIAGNOSIS — Z7982 Long term (current) use of aspirin: Secondary | ICD-10-CM

## 2017-06-12 DIAGNOSIS — I471 Supraventricular tachycardia: Secondary | ICD-10-CM | POA: Diagnosis not present

## 2017-06-12 DIAGNOSIS — R195 Other fecal abnormalities: Secondary | ICD-10-CM

## 2017-06-12 DIAGNOSIS — C189 Malignant neoplasm of colon, unspecified: Secondary | ICD-10-CM | POA: Insufficient documentation

## 2017-06-12 DIAGNOSIS — I10 Essential (primary) hypertension: Secondary | ICD-10-CM | POA: Insufficient documentation

## 2017-06-12 DIAGNOSIS — N281 Cyst of kidney, acquired: Secondary | ICD-10-CM | POA: Insufficient documentation

## 2017-06-12 DIAGNOSIS — K5904 Chronic idiopathic constipation: Secondary | ICD-10-CM

## 2017-06-12 DIAGNOSIS — K5900 Constipation, unspecified: Secondary | ICD-10-CM | POA: Diagnosis not present

## 2017-06-12 DIAGNOSIS — E042 Nontoxic multinodular goiter: Secondary | ICD-10-CM | POA: Insufficient documentation

## 2017-06-12 DIAGNOSIS — C801 Malignant (primary) neoplasm, unspecified: Secondary | ICD-10-CM

## 2017-06-12 DIAGNOSIS — R748 Abnormal levels of other serum enzymes: Secondary | ICD-10-CM | POA: Diagnosis not present

## 2017-06-12 DIAGNOSIS — D509 Iron deficiency anemia, unspecified: Secondary | ICD-10-CM | POA: Diagnosis not present

## 2017-06-12 DIAGNOSIS — M545 Low back pain: Secondary | ICD-10-CM | POA: Insufficient documentation

## 2017-06-12 DIAGNOSIS — R918 Other nonspecific abnormal finding of lung field: Secondary | ICD-10-CM | POA: Insufficient documentation

## 2017-06-12 DIAGNOSIS — Z79899 Other long term (current) drug therapy: Secondary | ICD-10-CM

## 2017-06-12 DIAGNOSIS — E538 Deficiency of other specified B group vitamins: Secondary | ICD-10-CM | POA: Diagnosis not present

## 2017-06-12 LAB — CMP (CANCER CENTER ONLY)
ALK PHOS: 177 U/L — AB (ref 40–150)
ALT: 8 U/L (ref 0–55)
ANION GAP: 6 (ref 3–11)
AST: 15 U/L (ref 5–34)
Albumin: 3.8 g/dL (ref 3.5–5.0)
BUN: 9 mg/dL (ref 7–26)
CALCIUM: 9.3 mg/dL (ref 8.4–10.4)
CO2: 24 mmol/L (ref 22–29)
Chloride: 108 mmol/L (ref 98–109)
Creatinine: 0.71 mg/dL (ref 0.60–1.10)
GFR, Est AFR Am: >60 mL/min (ref >60)
GFR, Estimated: >60 mL/min (ref >60)
Glucose, Bld: 96 mg/dL (ref 70–140)
Potassium: 4 mmol/L (ref 3.5–5.1)
SODIUM: 138 mmol/L (ref 136–145)
Total Bilirubin: 0.2 mg/dL (ref 0.2–1.2)
Total Protein: 7.7 g/dL (ref 6.4–8.3)

## 2017-06-12 LAB — CBC WITH DIFFERENTIAL (CANCER CENTER ONLY)
BASOS PCT: 0 %
Basophils Absolute: 0 10*3/uL (ref 0.0–0.1)
EOS PCT: 3 %
Eosinophils Absolute: 0.2 10*3/uL (ref 0.0–0.5)
HEMATOCRIT: 41.2 % (ref 34.8–46.6)
Hemoglobin: 13 g/dL (ref 11.6–15.9)
LYMPHS PCT: 19 %
Lymphs Abs: 1.1 10*3/uL (ref 0.9–3.3)
MCH: 28.8 pg (ref 25.1–34.0)
MCHC: 31.6 g/dL (ref 31.5–36.0)
MCV: 91.2 fL (ref 79.5–101.0)
MONO ABS: 0.7 10*3/uL (ref 0.1–0.9)
MONOS PCT: 13 %
NEUTROS ABS: 3.8 10*3/uL (ref 1.5–6.5)
Neutrophils Relative %: 65 %
Platelet Count: 412 10*3/uL — ABNORMAL HIGH (ref 145–400)
RBC: 4.52 MIL/uL (ref 3.70–5.45)
RDW: 15.8 % — AB (ref 11.2–14.5)
WBC Count: 5.8 10*3/uL (ref 3.9–10.3)

## 2017-06-12 NOTE — Patient Instructions (Signed)

## 2017-06-12 NOTE — Progress Notes (Signed)
Symptoms Management Clinic Progress Note   [name] [name] [id number] [birth year] 84 y.o.  [name] [name] is managed by Dr. Sullivan Lone  Actively treated with chemotherapy: no   Assessment: Plan:    Malignant neoplasm of colon, unspecified part of colon ([place])   Newly diagnosed malignant neoplasm of the colon: The patient is awaiting her staging CT scans and has been referred by Dr. Earlean Shawl for a surgical consult.  A CBC and chemistry panel were completed today.  These results returned with a hemoglobin higher at 13 with a hematocrit higher at 41.2.  The patient's chemistry panel returned with only her alkaline phosphatase elevated at 177.  Please see After Visit Summary for patient specific instructions.  Future Appointments  Date Time Provider [place]  06/18/2017  2:10 PM GI-315 CT 1 GI-315CT [facility] [facility]  06/18/2017  2:30 PM GI-315 CT 1 GI-315CT [facility]-[address] [facility]  08/19/2017 11:30 AM [facility] 5 [facility] None  08/19/2017 12:00 PM Brunetta Genera, MD [facility] None    No orders of the defined types were placed in this encounter.      Subjective:   Patient ID:  [name] [name] is a 84 y.o. (DOB [birth year]) female.  Chief Complaint:  Chief Complaint  Patient presents with  . Fatigue    HPI [name] [name] is an 84 year old female who has been followed by Dr. Sullivan Lone for iron deficiency anemia and for changes in bowel habit.  The patient had recently had a colonoscopy completed in the care of Dr. Richmond Campbell with a biopsy returning positive for an invasive adenocarcinoma.  The patient is pending CT scans and has a referral made to surgery.  This office was contacted yesterday by the patient's family with concern that the patient was having paleness of her nailbeds and was fatigued.  The patient reports today that she is feeling better.  She denies fatigue.  She is having continued pallor of her nailbeds.  She reports that she is sleeping well and has a  good appetite.  The patient reports with her husband and 1 of her daughters today.  She does acknowledge that her family is very concerned about her.  Medications: I have reviewed the patient's current medications.  Allergies:  Allergies  Allergen Reactions  . Aspirin Hives  . Codeine Other (See Comments)    REACTION: GI upset  . Hydrochlorothiazide Rash  . Penicillins Hives, Shortness Of Breath and Rash    Has patient had a PCN reaction causing immediate rash, facial/tongue/throat swelling, SOB or lightheadedness with hypotension: yes Has patient had a PCN reaction causing severe rash involving mucus membranes or skin necrosis: unknown Has patient had a PCN reaction that required hospitalization : unknown Has patient had a PCN reaction occurring within the last 10 years: no If all of the above answers are "NO", then may proceed with Cephalosporin use.   . Sulfonamide Derivatives Rash  . Levofloxacin Other (See Comments)    Joint aches and weakness  . Macrodantin [Nitrofurantoin] Other (See Comments)    Unknown reaction    Past Medical History:  Diagnosis Date  . Atrial tachycardia ([place])   . HTN (hypertension)   . Jaundice due to hepatitis    at age 16 or 85  . Transient global amnesia    AMS  . Tremors of nervous system    benign familial    Past Surgical History:  Procedure Laterality Date  . ABDOMINAL HERNIA REPAIR    .  BACK SURGERY     years ago  . TONSILLECTOMY      Family History  Problem Relation Age of Onset  . Lung cancer Mother   . Parkinson's disease Mother   . Hypertension Mother   . Other Father        blood clot  . Arthritis Sister   . Other Sister        spinal stenosis    Social History   Socioeconomic History  . Marital status: Married    Spouse name: Not on file  . Number of children: Not on file  . Years of education: Not on file  . Highest education level: Not on file  Occupational History  . Not on file  Social Needs  . Financial  resource strain: Not on file  . Food insecurity:    Worry: Not on file    Inability: Not on file  . Transportation needs:    Medical: Not on file    Non-medical: Not on file  Tobacco Use  . Smoking status: Never Smoker  . Smokeless tobacco: Never Used  . Tobacco comment: + prior 2nd hand exposure from spouse smoking  Substance and Sexual Activity  . Alcohol use: No    Alcohol/week: 0.0 oz  . Drug use: No  . Sexual activity: Never  Lifestyle  . Physical activity:    Days per week: Not on file    Minutes per session: Not on file  . Stress: Not on file  Relationships  . Social connections:    Talks on phone: Not on file    Gets together: Not on file    Attends religious service: Not on file    Active member of club or organization: Not on file    Attends meetings of clubs or organizations: Not on file    Relationship status: Not on file  . Intimate partner violence:    Fear of current or ex partner: Not on file    Emotionally abused: Not on file    Physically abused: Not on file    Forced sexual activity: Not on file  Other Topics Concern  . Not on file  Social History Narrative  . Not on file    Past Medical History, Surgical history, Social history, and Family history were reviewed and updated as appropriate.   Please see review of systems for further details on the patient's review from today.   Review of Systems:  Review of Systems  Constitutional: Negative for diaphoresis.  HENT: Negative for trouble swallowing.   Respiratory: Negative for cough, choking, chest tightness, shortness of breath and wheezing.   Cardiovascular: Negative for chest pain and palpitations.  Gastrointestinal: Negative for constipation, diarrhea, nausea and vomiting.  Musculoskeletal: Negative for back pain.  Skin: Positive for pallor. Negative for color change and rash.  Neurological: Negative for dizziness, light-headedness and headaches.    Objective:   Physical Exam:  BP (!)  172/68 (BP Location: Right Arm, Patient Position: Sitting) Comment: nurse aware of bp  Pulse 76   Temp 97.8 F (36.6 C) (Oral)   Resp 17   Ht 5\' 2"  (1.575 m)   Wt 155 lb 4.8 oz (70.4 kg)   SpO2 100%   BMI 28.40 kg/m   ECOG: 0  Physical Exam  Constitutional: No distress.  HENT:  Head: Normocephalic and atraumatic.  Cardiovascular: Normal rate, regular rhythm and normal heart sounds. Exam reveals no gallop and no friction rub.  No murmur heard. Pulmonary/Chest: Effort  normal and breath sounds normal. No respiratory distress. She has no wheezes. She has no rales.  Neurological: She is alert.  Skin: Skin is warm and dry. No rash noted. She is not diaphoretic. No erythema.  The patient's nailbeds appear to have decreased capillary refill.    Lab Review:     Component Value Date/Time   NA 138 06/12/2017 0942   NA 139 11/12/2016 1317   K 4.0 06/12/2017 0942   K 4.0 11/12/2016 1317   CL 108 06/12/2017 0942   CO2 24 06/12/2017 0942   CO2 23 11/12/2016 1317   GLUCOSE 96 06/12/2017 0942   GLUCOSE 118 11/12/2016 1317   BUN 9 06/12/2017 0942   BUN 16.5 11/12/2016 1317   CREATININE 0.71 06/12/2017 0942   CREATININE 1.0 11/12/2016 1317   CALCIUM 9.3 06/12/2017 0942   CALCIUM 9.0 11/12/2016 1317   PROT 7.7 06/12/2017 0942   PROT 6.9 11/12/2016 1317   ALBUMIN 3.8 06/12/2017 0942   ALBUMIN 3.4 (L) 11/12/2016 1317   AST 15 06/12/2017 0942   AST 14 11/12/2016 1317   ALT 8 06/12/2017 0942   ALT 8 11/12/2016 1317   ALKPHOS 177 (H) 06/12/2017 0942   ALKPHOS 110 11/12/2016 1317   BILITOT 0.2 06/12/2017 0942   BILITOT <0.22 11/12/2016 1317   GFRNONAA >60 06/12/2017 0942   GFRAA >60 06/12/2017 0942       Component Value Date/Time   WBC 5.8 06/12/2017 0942   WBC 5.9 05/19/2017 1331   RBC 4.52 06/12/2017 0942   HGB 13.0 06/12/2017 0942   HGB 11.4 (L) 11/12/2016 1317   HCT 41.2 06/12/2017 0942   HCT 36.1 11/12/2016 1317   PLT 412 (H) 06/12/2017 0942   PLT 300 11/12/2016 1317    MCV 91.2 06/12/2017 0942   MCV 88.5 11/12/2016 1317   MCH 28.8 06/12/2017 0942   MCHC 31.6 06/12/2017 0942   RDW 15.8 (H) 06/12/2017 0942   RDW 21.7 (H) 11/12/2016 1317   LYMPHSABS 1.1 06/12/2017 0942   LYMPHSABS 1.3 11/12/2016 1317   MONOABS 0.7 06/12/2017 0942   MONOABS 0.3 11/12/2016 1317   EOSABS 0.2 06/12/2017 0942   EOSABS 0.3 11/12/2016 1317   BASOSABS 0.0 06/12/2017 0942   BASOSABS 0.0 11/12/2016 1317   -------------------------------  Imaging from last 24 hours (if applicable):  Radiology interpretation: No results found.      This case was discussed with Dr. Irene Limbo. He expresses agreement with my management of this patient.

## 2017-06-15 ENCOUNTER — Other Ambulatory Visit: Payer: Self-pay | Admitting: *Deleted

## 2017-06-15 DIAGNOSIS — D509 Iron deficiency anemia, unspecified: Secondary | ICD-10-CM

## 2017-06-18 ENCOUNTER — Ambulatory Visit
Admission: RE | Admit: 2017-06-18 | Discharge: 2017-06-18 | Disposition: A | Discharge status: Home / Self Care | Payer: MEDICARE | Source: Ambulatory Visit | Attending: Gastroenterology | Admitting: Gastroenterology

## 2017-06-18 DIAGNOSIS — R918 Other nonspecific abnormal finding of lung field: Secondary | ICD-10-CM | POA: Diagnosis not present

## 2017-06-18 DIAGNOSIS — C801 Malignant (primary) neoplasm, unspecified: Secondary | ICD-10-CM

## 2017-06-18 DIAGNOSIS — R195 Other fecal abnormalities: Secondary | ICD-10-CM

## 2017-06-18 DIAGNOSIS — K5904 Chronic idiopathic constipation: Secondary | ICD-10-CM

## 2017-06-18 DIAGNOSIS — C189 Malignant neoplasm of colon, unspecified: Secondary | ICD-10-CM | POA: Diagnosis not present

## 2017-06-18 MED ORDER — IOPAMIDOL (ISOVUE-300) INJECTION 61%
100.0000 mL | Freq: Once | INTRAVENOUS | Status: AC | PRN
  Administered 2017-06-18: 100 mL via INTRAVENOUS

## 2017-06-18 NOTE — Progress Notes (Signed)
HEMATOLOGY/ONCOLOGY CLINIC NOTE  Date of Service: 06/22/17   Patient Care Team: Lawerance Cruel, MD as PCP - General  CHIEF COMPLAINTS/PURPOSE OF CONSULTATION:  Iron deficiency Anemia and B12 deficiency Newly diagnosed colon cancer  HISTORY OF PRESENTING ILLNESS:   [name] [name] is a wonderful 84 y.o. female who has been referred to [place] by Dr .Harrington Challenger, Dwyane Luo, MD for evaluation and management of microcytic anemia.  Patient has a h/o HTN, atrial tachycardia, , benign tremor who was noted to have a new severe microcytic anemia and weakness with a drop of hgb down to 7.4 in sept 2017. She was admitted to the hospital and received 1 unit of PRBC. She was noted to have severe new iron deficiency and also noted to have B12 deficiency. Her stool occult blood was apparently neg. Was discharged on PO iron and was taking it for a few months. Rpt stool studies with PCP were again noted to be hemoccult neg. She received B12 IM x 1 in the hospital but has not been on any B12 since then. Patient had refused a GI workup in the hospital and was to be seen by Dr Fuller Plan for GI workup in the outpatient setting but has refused and continues to refuse and GI workup.  Denies any overt GI bleeding. No melena no hematemesis no hematochezia. No nausea/vomiting or abdominal pain. No significant weight loss recently.  Her hgb improved some on Po iron but then started dropping again and so she was referred to [place] for consideration of IV iron replacement.  On CXR in the hospital she was incidentally noted to have a moderate to large hiatal hernia.   INTERVAL HISTORY   [name] [name] is here for fu of her newly diagnosed colon cancer. The patient's last visit with [place] was on 05/19/17. She is accompanied today by her daughter, sister and husband. The pt reports that she is doing well overall.   The pt reports that she had yellow jaundice when she was 84 years old. She reports resolving constipation, and  notes that her lower back hurt when she was impacted.   On 06/19/17 the pt had a CT C/A/P which revealed 1. Scattered, indeterminate small pulmonary nodules are identified within both lungs measuring up to 5 mm. Cannot rule out metastatic disease. 2. Indeterminate right adrenal gland nodule measures 1.2 cm. A more definitive assessment of this nodule may be obtained with adrenal protocol MRI or CT. 3. No specific findings to suggest metastatic disease within the abdomen or pelvis. 4. Hiatal hernia 5.  Aortic Atherosclerosis (ICD10-I70.0). 6. Age-indeterminate T11 compression fracture.  Lab results today (06/22/17) of CBC, CMP, and Reticulocytes is as follows: all values are WNL except for RDW at 15.7, Retic ct pct at 2.5%, Retic ct abs at 103.8, Alk Phos at 174, and Total Bilirubin at <0.2. CEA 06/22/17 is at 1.01.   On review of systems, pt reports decreased appetite, resolving constipation, occasional lower back pain,  and denies fevers, chills, night sweats, abdominal pains, and any other symptoms.   MEDICAL HISTORY:  Past Medical History:  Diagnosis Date  . Atrial tachycardia ([place])   . HTN (hypertension)   . Jaundice due to hepatitis    at age 60 or 14  . Transient global amnesia    AMS  . Tremors of nervous system    benign familial  Large Hiatal hernia HCAP 10/2015 Iron deficiency Anemia B12 deficiency.  SURGICAL HISTORY: Past Surgical History:  Procedure  Laterality Date  . ABDOMINAL HERNIA REPAIR    . BACK SURGERY     years ago  . TONSILLECTOMY      SOCIAL HISTORY: Social History   Socioeconomic History  . Marital status: Married    Spouse name: Not on file  . Number of children: Not on file  . Years of education: Not on file  . Highest education level: Not on file  Occupational History  . Not on file  Social Needs  . Financial resource strain: Not on file  . Food insecurity:    Worry: Not on file    Inability: Not on file  . Transportation needs:    Medical:  Not on file    Non-medical: Not on file  Tobacco Use  . Smoking status: Never Smoker  . Smokeless tobacco: Never Used  . Tobacco comment: + prior 2nd hand exposure from spouse smoking  Substance and Sexual Activity  . Alcohol use: No    Alcohol/week: 0.0 oz  . Drug use: No  . Sexual activity: Never  Lifestyle  . Physical activity:    Days per week: Not on file    Minutes per session: Not on file  . Stress: Not on file  Relationships  . Social connections:    Talks on phone: Not on file    Gets together: Not on file    Attends religious service: Not on file    Active member of club or organization: Not on file    Attends meetings of clubs or organizations: Not on file    Relationship status: Not on file  . Intimate partner violence:    Fear of current or ex partner: Not on file    Emotionally abused: Not on file    Physically abused: Not on file    Forced sexual activity: Not on file  Other Topics Concern  . Not on file  Social History Narrative  . Not on file    FAMILY HISTORY: Family History  Problem Relation Age of Onset  . Lung cancer Mother   . Parkinson's disease Mother   . Hypertension Mother   . Other Father        blood clot  . Arthritis Sister   . Other Sister        spinal stenosis    ALLERGIES:  is allergic to aspirin; codeine; hydrochlorothiazide; penicillins; sulfonamide derivatives; levofloxacin; and macrodantin [nitrofurantoin].  MEDICATIONS:  Current Outpatient Medications  Medication Sig Dispense Refill  . amLODipine (NORVASC) 10 MG tablet TAKE 1 TABLET DAILY 90 tablet 3  . cloNIDine (CATAPRES) 0.2 MG tablet TAKE 1 TABLET EVERY MORNINGAND 1 AND 1/2 TABLETS AT   BEDTIME 225 tablet 3  . lubiprostone (AMITIZA) 8 MCG capsule Take 24 mcg by mouth 2 (two) times daily with a meal.    . magnesium citrate SOLN Take 1 Bottle by mouth as needed.     . metoprolol tartrate (LOPRESSOR) 25 MG tablet TAKE 1/2 TABLET TWICE A DAY 90 tablet 0  . nitrofurantoin,  macrocrystal-monohydrate, (MACROBID) 100 MG capsule Take 1 capsule (100 mg total) by mouth daily. Prevention of bladder infection 90 capsule 2  . UNABLE TO FIND [employer] St[employer] Lift 1 Device 0   No current facility-administered medications for this visit.     REVIEW OF SYSTEMS:    Constipation - chronic but worsened recently. A 10+ POINT REVIEW OF SYSTEMS WAS OBTAINED including neurology, dermatology, psychiatry, cardiac, respiratory, lymph, extremities, GI, GU, Musculoskeletal, constitutional, breasts, reproductive, HEENT.  All pertinent positives are noted in the HPI.  All others are negative.    PHYSICAL EXAMINATION:  ECOG PERFORMANCE STATUS: 2 - Symptomatic, <50% confined to bed  . Vitals:   06/22/17 1452  BP: (!) 146/83  Pulse: 63  Resp: 17  Temp: 98.3 F (36.8 C)  SpO2: 99%   Filed Weights   06/22/17 1452  Weight: 156 lb 9.6 oz (71 kg)   .Body mass index is 28.64 kg/m.  GENERAL:alert, in no acute distress and comfortable SKIN: no acute rashes, no significant lesions EYES: conjunctiva are pink and non-injected, sclera anicteric OROPHARYNX: MMM, no exudates, no oropharyngeal erythema or ulceration NECK: supple, no JVD LYMPH:  no palpable lymphadenopathy in the cervical, axillary or inguinal regions LUNGS: clear to auscultation b/l with normal respiratory effort HEART: regular rate & rhythm ABDOMEN:  normoactive bowel sounds , non tender, not distended. Extremity: no pedal edema PSYCH: alert & oriented x 3 with fluent speech NEURO: no focal motor/sensory deficits   LABORATORY DATA:  I have reviewed the data as listed  . CBC Latest Ref Rng & Units 06/22/2017 06/12/2017 05/19/2017  WBC 3.9 - 10.3 K/uL 6.1 5.8 5.9  Hemoglobin 11.6 - 15.9 g/dL 12.2 13.0 11.8  Hematocrit 34.8 - 46.6 % 38.5 41.2 37.0  Platelets 145 - 400 K/uL 377 412(H) 336    . CMP Latest Ref Rng & Units 06/22/2017 06/12/2017 05/19/2017  Glucose 70 - 140 mg/dL 138 96 154(H)  BUN 7 - 26 mg/dL _0 Creatinine 0.60 - 1.10 mg/dL 0.78 0.71 0.76  Sodium 136 - 145 mmol/L 139 138 138  Potassium 3.5 - 5.1 mmol/L 3.8 4.0 3.9  Chloride 98 - 109 mmol/L 108 108 105  CO2 22 - 29 mmol/L _1 Calcium 8.4 - 10.4 mg/dL 8.7 9.3 8.9  Total Protein 6.4 - 8.3 g/dL 6.9 7.7 6.7  Total Bilirubin 0.2 - 1.2 mg/dL <0.2(L) 0.2 0.2  Alkaline Phos 40 - 150 U/L 174(H) 177(H) 145  AST 5 - 34 U/L _2 ALT 0 - 55 U/L _3 . Lab Results  Component Value Date   IRON 48 05/19/2017   TIBC 350 05/19/2017   IRONPCTSAT 14 (L) 05/19/2017   (Iron and TIBC)  Lab Results  Component Value Date   FERRITIN 61 05/19/2017    Component     Latest Ref Rng & Units 05/08/2016  Parietal Cell Ab     0.0 - 20.0 Units 3.5  Intrinsic Factor Abs, Serum     0.0 - 1.1 AU/mL 0.9   B12 1674<----1444<---257    RADIOGRAPHIC STUDIES: I have personally reviewed the radiological images as listed and agreed with the findings in the report. Ct Chest W Contrast  Result Date: 06/19/2017 CLINICAL DATA:  New diagnosis of colon cancer. EXAM: CT CHEST, ABDOMEN, AND PELVIS WITH CONTRAST TECHNIQUE: Multidetector CT imaging of the chest, abdomen and pelvis was performed following the standard protocol during bolus administration of intravenous contrast. CONTRAST:  158m ISOVUE-300 IOPAMIDOL (ISOVUE-300) INJECTION 61% COMPARISON:  None. FINDINGS: CT CHEST FINDINGS Cardiovascular: The heart size is normal. Aortic atherosclerosis noted. No pericardial effusion. Mediastinum/Nodes: Bilateral low-attenuation thyroid nodules identified. The largest is on the right measuring 1.4 cm, image 5/2. The trachea appears patent and is midline. Large hiatal hernia. No mediastinal or hilar adenopathy. Lungs/Pleura: No pleural effusion. Noncalcified nodule in the right upper lobe measures 5 mm, image 40/6. Superior segment of right lower lobe pulmonary  nodule measures 4 mm, image 31/6. Anterior left upper lobe lung nodule measures 4 mm, image 57/6. In  the right lower lobe there is a 3 mm nodule, image 90/6. Scattered small pulmonary nodules are also noted within the right lung. Index right upper lobe lung nodule measures 3 mm, image 63/6. In the right middle lobe there is a 3 mm lung nodule, image 88/6. And in the right lower lobe there is a nodule measuring 3 mm, image 71/6. Musculoskeletal: Degenerative disc disease noted within the thoracic spine. There is a compression fracture and within the T11 vertebra which is new from 10/31/2015. There are no aggressive lytic or sclerotic bone lesions. CT ABDOMEN PELVIS FINDINGS Hepatobiliary: Within segment 4B of the liver there is a indeterminate low-density structure measuring 6 mm, image 55/2. This is too small to reliably characterize. No additional focal liver abnormalities. The gallbladder appears normal. No biliary dilatation. Pancreas: Unremarkable appearance of the pancreas. Spleen: Spleen normal. Adrenals/Urinary Tract: Indeterminate nodule in the right adrenal gland measures 1.2 cm and 78 HU, image 49/2. Unremarkable appearance of the left adrenal gland. 2.3 cm simple appearing right kidney cysts noted there is moderate to marked distension of the urinary bladder. Stomach/Bowel: Large hiatal which contains greater than 50% intrathoracic stomach. The small bowel loops have a normal caliber. Moderate stool burden identified within the colon. No discrete mass identified. Vascular/Lymphatic: Aortic atherosclerosis without aneurysm. 9 mm right lower quadrant ileocolic lymph node is identified, image 78/2. No enlarged retroperitoneal or mesenteric lymph nodes. Reproductive: Enlarged partially calcified fibroid uterus is again noted. No adnexal mass identified. Other: No ascites or focal fluid collections. Musculoskeletal: No acute or significant osseous findings. IMPRESSION: 1. Scattered, indeterminate small pulmonary nodules are identified within both lungs measuring up to 5 mm. Cannot rule out metastatic disease.  2. Indeterminate right adrenal gland nodule measures 1.2 cm. A more definitive assessment of this nodule may be obtained with adrenal protocol MRI or CT. 3. No specific findings to suggest metastatic disease within the abdomen or pelvis. 4. Hiatal hernia 5.  Aortic Atherosclerosis (ICD10-I70.0). 6. Age-indeterminate T11 compression fracture. Electronically Signed   By: Kerby Moors M.D.   On: 06/19/2017 15:48   Ct Abdomen Pelvis W Contrast  Result Date: 06/19/2017 CLINICAL DATA:  New diagnosis of colon cancer. EXAM: CT CHEST, ABDOMEN, AND PELVIS WITH CONTRAST TECHNIQUE: Multidetector CT imaging of the chest, abdomen and pelvis was performed following the standard protocol during bolus administration of intravenous contrast. CONTRAST:  164m ISOVUE-300 IOPAMIDOL (ISOVUE-300) INJECTION 61% COMPARISON:  None. FINDINGS: CT CHEST FINDINGS Cardiovascular: The heart size is normal. Aortic atherosclerosis noted. No pericardial effusion. Mediastinum/Nodes: Bilateral low-attenuation thyroid nodules identified. The largest is on the right measuring 1.4 cm, image 5/2. The trachea appears patent and is midline. Large hiatal hernia. No mediastinal or hilar adenopathy. Lungs/Pleura: No pleural effusion. Noncalcified nodule in the right upper lobe measures 5 mm, image 40/6. Superior segment of right lower lobe pulmonary nodule measures 4 mm, image 31/6. Anterior left upper lobe lung nodule measures 4 mm, image 57/6. In the right lower lobe there is a 3 mm nodule, image 90/6. Scattered small pulmonary nodules are also noted within the right lung. Index right upper lobe lung nodule measures 3 mm, image 63/6. In the right middle lobe there is a 3 mm lung nodule, image 88/6. And in the right lower lobe there is a nodule measuring 3 mm, image 71/6. Musculoskeletal: Degenerative disc disease noted within the thoracic spine. There is a compression  fracture and within the T11 vertebra which is new from 10/31/2015. There are no  aggressive lytic or sclerotic bone lesions. CT ABDOMEN PELVIS FINDINGS Hepatobiliary: Within segment 4B of the liver there is a indeterminate low-density structure measuring 6 mm, image 55/2. This is too small to reliably characterize. No additional focal liver abnormalities. The gallbladder appears normal. No biliary dilatation. Pancreas: Unremarkable appearance of the pancreas. Spleen: Spleen normal. Adrenals/Urinary Tract: Indeterminate nodule in the right adrenal gland measures 1.2 cm and 78 HU, image 49/2. Unremarkable appearance of the left adrenal gland. 2.3 cm simple appearing right kidney cysts noted there is moderate to marked distension of the urinary bladder. Stomach/Bowel: Large hiatal which contains greater than 50% intrathoracic stomach. The small bowel loops have a normal caliber. Moderate stool burden identified within the colon. No discrete mass identified. Vascular/Lymphatic: Aortic atherosclerosis without aneurysm. 9 mm right lower quadrant ileocolic lymph node is identified, image 78/2. No enlarged retroperitoneal or mesenteric lymph nodes. Reproductive: Enlarged partially calcified fibroid uterus is again noted. No adnexal mass identified. Other: No ascites or focal fluid collections. Musculoskeletal: No acute or significant osseous findings. IMPRESSION: 1. Scattered, indeterminate small pulmonary nodules are identified within both lungs measuring up to 5 mm. Cannot rule out metastatic disease. 2. Indeterminate right adrenal gland nodule measures 1.2 cm. A more definitive assessment of this nodule may be obtained with adrenal protocol MRI or CT. 3. No specific findings to suggest metastatic disease within the abdomen or pelvis. 4. Hiatal hernia 5.  Aortic Atherosclerosis (ICD10-I70.0). 6. Age-indeterminate T11 compression fracture. Electronically Signed   By: Kerby Moors M.D.   On: 06/19/2017 15:48    ASSESSMENT & PLAN:   84 yo caucasian female with   1) Severe Microcytic Anemia s/p  PRBc transfusion in sept 2017.  This appears to be likely related to severe iron deficiency  2) Severe Iron deficiency No overt source of bleeding Statistically more likely to be due to slow ongoing GI losses ?HH with cameron ulcers vs AVMs vs diverticular bleeding vs PUD vs tumors  Have discussed this understanding with patient and she has previously been opposed to any idea of a GI workup but did eventually see Dr Earlean Shawl given concerns of worsening constipation. Hemoglobin is stable at 11.8 as of today 05/19/17 . Lab Results  Component Value Date   IRON 48 05/19/2017   TIBC 350 05/19/2017   IRONPCTSAT 14 (L) 05/19/2017   (Iron and TIBC)  Lab Results  Component Value Date   FERRITIN 61 05/19/2017   3) B12 deficiency - antiparietal cell and anti IF ab neg. B12 levels adequate with current replacement.   PLAN -I discussed goal is to maintain Ferritin > 100 and iron saturation at 20%.  -Treatment would call for more aggressive intervention -- IV Injectafer x 1 doses (Tylenol/Benadryl- premedications) -Pt has been seen by GI Dr. Earlean Shawl.  -continue vit B complex 1 tab po daily to support accelerated hematopoiesis.  4. Colon Cancer -Discussed pt labwork today, 06/22/17; blood counts are stable.  -Discussed 06/19/17 CT C/A/P which did not reveal any enlarged lymph nodes in the immediate area. Scattered, indeterminate small pulmonary nodules are identified within both lungs measuring up to 5 mm. Cannot rule out metastatic disease. Incidental T11 compression fracture detected.  -Discussed the recommendation to attain PET/CT prior to surgery -Will refer pt to surgeon Dr Marcello Moores at [facility], for surgical discussion.    5) . Patient Active Problem List   Diagnosis Date Noted  . HCAP (healthcare-associated  pneumonia) 10/31/2015  . Iron deficiency anemia 10/20/2015  . B12 deficiency anemia 10/20/2015  . Symptomatic anemia 10/19/2015  . UTI (urinary tract infection) 07/26/2014  .  TGA (transient global amnesia) 07/25/2014  . Cardiac dysrhythmia 02/06/2010  . PALPITATIONS 02/06/2010  . CHICKENPOX, HX OF 09/07/2008  . Essential hypertension, benign 05/22/2008  . PSVT 03/27/2008  . OBESITY, UNSPECIFIED 03/26/2008  . Abnormal involuntary movement 03/26/2008  . AMNESIA, TRANSIENT GLOBAL 01/10/2007  -advised continued f/u with PCP for management of her other medical co-morbids   PET/CT in 5 days Referral to Dr Leighton Ruff at [facility] [place] [facility] for newly diagnosed colon cancer RTC with Dr Irene Limbo in 3 weeks with labs    All of the patients questions were answered with apparent satisfaction. The patient knows to call the clinic with any problems, questions or concerns.   The toal time spent in the appt was 40 minutes and more than 50% was on counseling and direct patient cares.  Sullivan Lone MD [place] AAHIVMS [facility] Hematology/Oncology Physician [facility]  (Office):       [phone] (Work cell):  [phone] (Fax):           [phone]  This document serves as a record of services personally performed by Sullivan Lone, MD. It was created on his behalf by Baldwin Jamaica, a trained medical scribe. The creation of this record is based on the scribe's personal observations and the provider's statements to them.   .I have reviewed the above documentation for accuracy and completeness, and I agree with the above. Brunetta Genera MD MS

## 2017-06-22 ENCOUNTER — Inpatient Hospital Stay: Payer: MEDICARE

## 2017-06-22 ENCOUNTER — Inpatient Hospital Stay (HOSPITAL_BASED_OUTPATIENT_CLINIC_OR_DEPARTMENT_OTHER): Payer: MEDICARE | Admitting: Hematology

## 2017-06-22 ENCOUNTER — Telehealth: Payer: Self-pay | Admitting: Hematology

## 2017-06-22 VITALS — BP 146/83 | HR 63 | Temp 98.3°F | Resp 17 | Ht 62.0 in | Wt 156.6 lb

## 2017-06-22 DIAGNOSIS — R748 Abnormal levels of other serum enzymes: Secondary | ICD-10-CM

## 2017-06-22 DIAGNOSIS — R251 Tremor, unspecified: Secondary | ICD-10-CM

## 2017-06-22 DIAGNOSIS — E538 Deficiency of other specified B group vitamins: Secondary | ICD-10-CM | POA: Diagnosis not present

## 2017-06-22 DIAGNOSIS — G454 Transient global amnesia: Secondary | ICD-10-CM | POA: Diagnosis not present

## 2017-06-22 DIAGNOSIS — D509 Iron deficiency anemia, unspecified: Secondary | ICD-10-CM

## 2017-06-22 DIAGNOSIS — K449 Diaphragmatic hernia without obstruction or gangrene: Secondary | ICD-10-CM | POA: Diagnosis not present

## 2017-06-22 DIAGNOSIS — N281 Cyst of kidney, acquired: Secondary | ICD-10-CM

## 2017-06-22 DIAGNOSIS — Z9189 Other specified personal risk factors, not elsewhere classified: Secondary | ICD-10-CM

## 2017-06-22 DIAGNOSIS — Z801 Family history of malignant neoplasm of trachea, bronchus and lung: Secondary | ICD-10-CM

## 2017-06-22 DIAGNOSIS — R630 Anorexia: Secondary | ICD-10-CM | POA: Diagnosis not present

## 2017-06-22 DIAGNOSIS — I471 Supraventricular tachycardia: Secondary | ICD-10-CM | POA: Diagnosis not present

## 2017-06-22 DIAGNOSIS — D259 Leiomyoma of uterus, unspecified: Secondary | ICD-10-CM

## 2017-06-22 DIAGNOSIS — K5900 Constipation, unspecified: Secondary | ICD-10-CM

## 2017-06-22 DIAGNOSIS — M545 Low back pain: Secondary | ICD-10-CM

## 2017-06-22 DIAGNOSIS — Z7982 Long term (current) use of aspirin: Secondary | ICD-10-CM

## 2017-06-22 DIAGNOSIS — C189 Malignant neoplasm of colon, unspecified: Secondary | ICD-10-CM | POA: Diagnosis not present

## 2017-06-22 DIAGNOSIS — I10 Essential (primary) hypertension: Secondary | ICD-10-CM | POA: Diagnosis not present

## 2017-06-22 DIAGNOSIS — E042 Nontoxic multinodular goiter: Secondary | ICD-10-CM

## 2017-06-22 DIAGNOSIS — I700 Atherosclerosis of aorta: Secondary | ICD-10-CM

## 2017-06-22 DIAGNOSIS — Z79899 Other long term (current) drug therapy: Secondary | ICD-10-CM

## 2017-06-22 DIAGNOSIS — R918 Other nonspecific abnormal finding of lung field: Secondary | ICD-10-CM

## 2017-06-22 LAB — CMP (CANCER CENTER ONLY)
ALT: 7 U/L (ref 0–55)
AST: 12 U/L (ref 5–34)
Albumin: 3.6 g/dL (ref 3.5–5.0)
Alkaline Phosphatase: 174 U/L — ABNORMAL HIGH (ref 40–150)
Anion gap: 7 (ref 3–11)
BUN: 14 mg/dL (ref 7–26)
CALCIUM: 8.7 mg/dL (ref 8.4–10.4)
CO2: 24 mmol/L (ref 22–29)
CREATININE: 0.78 mg/dL (ref 0.60–1.10)
Chloride: 108 mmol/L (ref 98–109)
GFR, Est AFR Am: >60 mL/min (ref >60)
GFR, Estimated: >60 mL/min (ref >60)
Glucose, Bld: 138 mg/dL (ref 70–140)
Potassium: 3.8 mmol/L (ref 3.5–5.1)
SODIUM: 139 mmol/L (ref 136–145)
TOTAL PROTEIN: 6.9 g/dL (ref 6.4–8.3)

## 2017-06-22 LAB — CBC WITH DIFFERENTIAL (CANCER CENTER ONLY)
Basophils Absolute: 0 10*3/uL (ref 0.0–0.1)
Basophils Relative: 0 %
EOS ABS: 0.3 10*3/uL (ref 0.0–0.5)
EOS PCT: 5 %
HCT: 38.5 % (ref 34.8–46.6)
Hemoglobin: 12.2 g/dL (ref 11.6–15.9)
LYMPHS ABS: 1.4 10*3/uL (ref 0.9–3.3)
Lymphocytes Relative: 23 %
MCH: 29.4 pg (ref 25.1–34.0)
MCHC: 31.7 g/dL (ref 31.5–36.0)
MCV: 92.8 fL (ref 79.5–101.0)
MONOS PCT: 8 %
Monocytes Absolute: 0.5 10*3/uL (ref 0.1–0.9)
Neutro Abs: 3.9 10*3/uL (ref 1.5–6.5)
Neutrophils Relative %: 64 %
PLATELETS: 377 10*3/uL (ref 145–400)
RBC: 4.15 MIL/uL (ref 3.70–5.45)
RDW: 15.7 % — ABNORMAL HIGH (ref 11.2–14.5)
WBC Count: 6.1 10*3/uL (ref 3.9–10.3)

## 2017-06-22 LAB — CEA (IN HOUSE-CHCC): CEA (CHCC-IN HOUSE): 1.01 ng/mL (ref 0.00–5.00)

## 2017-06-22 LAB — RETICULOCYTES
RBC.: 4.15 MIL/uL (ref 3.70–5.45)
RETIC CT PCT: 2.5 % — AB (ref 0.7–2.1)
Retic Count, Absolute: 103.8 10*3/uL — ABNORMAL HIGH (ref 33.7–90.7)

## 2017-06-22 NOTE — Telephone Encounter (Signed)
Scheduled appt per 5/13 los - gave patient AVS and calender per los. - referral sent to [facility] .

## 2017-07-01 ENCOUNTER — Encounter (HOSPITAL_COMMUNITY)
Admission: RE | Admit: 2017-07-01 | Discharge: 2017-07-01 | Disposition: A | Discharge status: Home / Self Care | Payer: MEDICARE | Source: Ambulatory Visit | Attending: Hematology | Admitting: Hematology

## 2017-07-01 DIAGNOSIS — Z9189 Other specified personal risk factors, not elsewhere classified: Secondary | ICD-10-CM | POA: Diagnosis not present

## 2017-07-01 DIAGNOSIS — C189 Malignant neoplasm of colon, unspecified: Secondary | ICD-10-CM | POA: Diagnosis not present

## 2017-07-01 LAB — GLUCOSE, CAPILLARY: GLUCOSE-CAPILLARY: 103 mg/dL — AB (ref 65–99)

## 2017-07-01 MED ORDER — FLUDEOXYGLUCOSE F - 18 (FDG) INJECTION
7.9000 | Freq: Once | INTRAVENOUS | Status: AC | PRN
  Administered 2017-07-01: 7.9 via INTRAVENOUS

## 2017-07-10 NOTE — Progress Notes (Signed)
HEMATOLOGY/ONCOLOGY CLINIC NOTE  Date of Service: 07/13/17   Patient Care Team: Lawerance Cruel, MD as PCP - General  CHIEF COMPLAINTS/PURPOSE OF CONSULTATION:  Iron deficiency Anemia and B12 deficiency Newly diagnosed colon cancer  HISTORY OF PRESENTING ILLNESS:   [name] [name] is a wonderful 84 y.o. female who has been referred to [place] by Dr .Harrington Challenger, Dwyane Luo, MD for evaluation and management of microcytic anemia.  Patient has a h/o HTN, atrial tachycardia, , benign tremor who was noted to have a new severe microcytic anemia and weakness with a drop of hgb down to 7.4 in sept 2017. She was admitted to the hospital and received 1 unit of PRBC. She was noted to have severe new iron deficiency and also noted to have B12 deficiency. Her stool occult blood was apparently neg. Was discharged on PO iron and was taking it for a few months. Rpt stool studies with PCP were again noted to be hemoccult neg. She received B12 IM x 1 in the hospital but has not been on any B12 since then. Patient had refused a GI workup in the hospital and was to be seen by Dr Fuller Plan for GI workup in the outpatient setting but has refused and continues to refuse and GI workup.  Denies any overt GI bleeding. No melena no hematemesis no hematochezia. No nausea/vomiting or abdominal pain. No significant weight loss recently.  Her hgb improved some on Po iron but then started dropping again and so she was referred to [place] for consideration of IV iron replacement.  On CXR in the hospital she was incidentally noted to have a moderate to large hiatal hernia.   INTERVAL HISTORY   [name] [name] is here for fu of her newly diagnosed colon cancer. The patient's last visit with [place] was on 06/22/17. She is accompanied today by her husband and two of her daughters. The pt reports that she is doing well overall.   The pt reports that she willhas an appointment with surgery on July 6. She notes that she is eating well,  moving her bowels well and is sleeping well also. She notes that she is still considering if she will move forward with surgery.   Of note since the patient's last visit, pt has had PET/CT completed on 07/01/17 with results revealing 1. Hypermetabolic colon mass in the vicinity of the ileocecal valve, maximum SUV 17.9, compatible with malignancy. There several small adjacent lymph nodes measuring up to 0.9 cm in short axis, these lymph nodes are not recognizably hypermetabolic. 2. No hypermetabolic activity associated with the tiny pulmonary nodules, although these nodules are below sensitive PET-CT size thresholds and accordingly may warrant surveillance. 3. Subtle accentuated metabolic activity in a left inferior thyroid lobe nodule. There is also some accentuated activity along the periphery of the right thyroid lobe, which could be from a peripheral thyroid lesion or conceivably an adjacent lymph node (tissue planes in this vicinity are indistinct). Thyroid ultrasound is recommended for further investigation. 4. Other imaging findings of potential clinical significance: Aortic Atherosclerosis (ICD10-I70.0). Mild cardiomegaly. Moderate to large sized hiatal hernia. Small right adrenal mass is technically nonspecific based on density but is not hypermetabolic and accordingly is probably benign. Mildly thick-walled urinary bladder, cystitis not excluded. Calcified uterine fibroids. Known T11 compression fracture without abnormal hypermetabolic activity.  Lab results today (07/13/17) of CBC and Reticulocytes is as follows: all values are WNL except for RBC at 3.59, HGB at 10.2, HCT at 32.5,  MCHC at 31.4. Ferritin 07/13/17 is at 36.   On review of systems, pt reports moving her bowels well, eating well, sleeping well, some coughing, occasional blood in the stools, and denies constipation, abdominal pains, black stools, and any other symptoms.    MEDICAL HISTORY:  Past Medical History:  Diagnosis Date    . Atrial tachycardia ([place])   . HTN (hypertension)   . Jaundice due to hepatitis    at age 58 or 69  . Transient global amnesia    AMS  . Tremors of nervous system    benign familial  Large Hiatal hernia HCAP 10/2015 Iron deficiency Anemia B12 deficiency.  SURGICAL HISTORY: Past Surgical History:  Procedure Laterality Date  . ABDOMINAL HERNIA REPAIR    . BACK SURGERY     years ago  . TONSILLECTOMY      SOCIAL HISTORY: Social History   Socioeconomic History  . Marital status: Married    Spouse name: Not on file  . Number of children: Not on file  . Years of education: Not on file  . Highest education level: Not on file  Occupational History  . Not on file  Social Needs  . Financial resource strain: Not on file  . Food insecurity:    Worry: Not on file    Inability: Not on file  . Transportation needs:    Medical: Not on file    Non-medical: Not on file  Tobacco Use  . Smoking status: Never Smoker  . Smokeless tobacco: Never Used  . Tobacco comment: + prior 2nd hand exposure from spouse smoking  Substance and Sexual Activity  . Alcohol use: No    Alcohol/week: 0.0 oz  . Drug use: No  . Sexual activity: Never  Lifestyle  . Physical activity:    Days per week: Not on file    Minutes per session: Not on file  . Stress: Not on file  Relationships  . Social connections:    Talks on phone: Not on file    Gets together: Not on file    Attends religious service: Not on file    Active member of club or organization: Not on file    Attends meetings of clubs or organizations: Not on file    Relationship status: Not on file  . Intimate partner violence:    Fear of current or ex partner: Not on file    Emotionally abused: Not on file    Physically abused: Not on file    Forced sexual activity: Not on file  Other Topics Concern  . Not on file  Social History Narrative  . Not on file    FAMILY HISTORY: Family History  Problem Relation Age of Onset  . Lung  cancer Mother   . Parkinson's disease Mother   . Hypertension Mother   . Other Father        blood clot  . Arthritis Sister   . Other Sister        spinal stenosis    ALLERGIES:  is allergic to aspirin; codeine; hydrochlorothiazide; penicillins; sulfonamide derivatives; levofloxacin; and macrodantin [nitrofurantoin].  MEDICATIONS:  Current Outpatient Medications  Medication Sig Dispense Refill  . amLODipine (NORVASC) 10 MG tablet TAKE 1 TABLET DAILY 90 tablet 3  . cloNIDine (CATAPRES) 0.2 MG tablet TAKE 1 TABLET EVERY MORNINGAND 1 AND 1/2 TABLETS AT   BEDTIME 225 tablet 3  . lubiprostone (AMITIZA) 8 MCG capsule Take 24 mcg by mouth 2 (two) times daily with a  meal.    . magnesium citrate SOLN Take 1 Bottle by mouth as needed.     . metoprolol tartrate (LOPRESSOR) 25 MG tablet TAKE 1/2 TABLET TWICE A DAY 90 tablet 0  . nitrofurantoin, macrocrystal-monohydrate, (MACROBID) 100 MG capsule Take 1 capsule (100 mg total) by mouth daily. Prevention of bladder infection 90 capsule 2  . UNABLE TO FIND [employer] Lift 1 Device 0   No current facility-administered medications for this visit.     REVIEW OF SYSTEMS:    A 10+ POINT REVIEW OF SYSTEMS WAS OBTAINED including neurology, dermatology, psychiatry, cardiac, respiratory, lymph, extremities, GI, GU, Musculoskeletal, constitutional, breasts, reproductive, HEENT.  All pertinent positives are noted in the HPI.  All others are negative.   PHYSICAL EXAMINATION:  ECOG PERFORMANCE STATUS: 2 - Symptomatic, <50% confined to bed  . Vitals:   07/13/17 1437  BP: (!) 135/59  Pulse: (!) 55  Resp: 16  Temp: 98.3 F (36.8 C)  SpO2: 97%   Filed Weights   07/13/17 1437  Weight: 156 lb 14.4 oz (71.2 kg)   .Body mass index is 28.7 kg/m.  GENERAL:alert, in no acute distress and comfortable SKIN: no acute rashes, no significant lesions EYES: conjunctiva are pink and non-injected, sclera anicteric OROPHARYNX: MMM, no exudates, no oropharyngeal  erythema or ulceration NECK: supple, no JVD LYMPH:  no palpable lymphadenopathy in the cervical, axillary or inguinal regions LUNGS: clear to auscultation b/l with normal respiratory effort HEART: regular rate & rhythm ABDOMEN:  normoactive bowel sounds , non tender, not distended. Extremity: no pedal edema PSYCH: alert & oriented x 3 with fluent speech NEURO: no focal motor/sensory deficits   LABORATORY DATA:  I have reviewed the data as listed  . CBC Latest Ref Rng & Units 07/13/2017 06/22/2017 06/12/2017  WBC 3.9 - 10.3 K/uL 7.6 6.1 5.8  Hemoglobin 11.6 - 15.9 g/dL 10.2(L) 12.2 13.0  Hematocrit 34.8 - 46.6 % 32.5(L) 38.5 41.2  Platelets 145 - 400 K/uL 396 377 412(H)    . CMP Latest Ref Rng & Units 06/22/2017 06/12/2017 05/19/2017  Glucose 70 - 140 mg/dL 138 96 154(H)  BUN 7 - 26 mg/dL 14 9 13   Creatinine 0.60 - 1.10 mg/dL 0.78 0.71 0.76  Sodium 136 - 145 mmol/L 139 138 138  Potassium 3.5 - 5.1 mmol/L 3.8 4.0 3.9  Chloride 98 - 109 mmol/L 108 108 105  CO2 22 - 29 mmol/L 24 24 25   Calcium 8.4 - 10.4 mg/dL 8.7 9.3 8.9  Total Protein 6.4 - 8.3 g/dL 6.9 7.7 6.7  Total Bilirubin 0.2 - 1.2 mg/dL <0.2(L) 0.2 0.2  Alkaline Phos 40 - 150 U/L 174(H) 177(H) 145  AST 5 - 34 U/L 12 15 13   ALT 0 - 55 U/L 7 8 10    . Lab Results  Component Value Date   IRON 48 05/19/2017   TIBC 350 05/19/2017   IRONPCTSAT 14 (L) 05/19/2017   (Iron and TIBC)  Lab Results  Component Value Date   FERRITIN 36 07/13/2017    Component     Latest Ref Rng & Units 05/08/2016  Parietal Cell Ab     0.0 - 20.0 Units 3.5  Intrinsic Factor Abs, Serum     0.0 - 1.1 AU/mL 0.9   B12 1674<----1444<---257    RADIOGRAPHIC STUDIES: I have personally reviewed the radiological images as listed and agreed with the findings in the report. Ct Chest W Contrast  Result Date: 06/19/2017 CLINICAL DATA:  New diagnosis of colon cancer.  EXAM: CT CHEST, ABDOMEN, AND PELVIS WITH CONTRAST TECHNIQUE: Multidetector CT imaging of  the chest, abdomen and pelvis was performed following the standard protocol during bolus administration of intravenous contrast. CONTRAST:  173mL ISOVUE-300 IOPAMIDOL (ISOVUE-300) INJECTION 61% COMPARISON:  None. FINDINGS: CT CHEST FINDINGS Cardiovascular: The heart size is normal. Aortic atherosclerosis noted. No pericardial effusion. Mediastinum/Nodes: Bilateral low-attenuation thyroid nodules identified. The largest is on the right measuring 1.4 cm, image 5/2. The trachea appears patent and is midline. Large hiatal hernia. No mediastinal or hilar adenopathy. Lungs/Pleura: No pleural effusion. Noncalcified nodule in the right upper lobe measures 5 mm, image 40/6. Superior segment of right lower lobe pulmonary nodule measures 4 mm, image 31/6. Anterior left upper lobe lung nodule measures 4 mm, image 57/6. In the right lower lobe there is a 3 mm nodule, image 90/6. Scattered small pulmonary nodules are also noted within the right lung. Index right upper lobe lung nodule measures 3 mm, image 63/6. In the right middle lobe there is a 3 mm lung nodule, image 88/6. And in the right lower lobe there is a nodule measuring 3 mm, image 71/6. Musculoskeletal: Degenerative disc disease noted within the thoracic spine. There is a compression fracture and within the T11 vertebra which is new from 10/31/2015. There are no aggressive lytic or sclerotic bone lesions. CT ABDOMEN PELVIS FINDINGS Hepatobiliary: Within segment 4B of the liver there is a indeterminate low-density structure measuring 6 mm, image 55/2. This is too small to reliably characterize. No additional focal liver abnormalities. The gallbladder appears normal. No biliary dilatation. Pancreas: Unremarkable appearance of the pancreas. Spleen: Spleen normal. Adrenals/Urinary Tract: Indeterminate nodule in the right adrenal gland measures 1.2 cm and 78 HU, image 49/2. Unremarkable appearance of the left adrenal gland. 2.3 cm simple appearing right kidney cysts noted  there is moderate to marked distension of the urinary bladder. Stomach/Bowel: Large hiatal which contains greater than 50% intrathoracic stomach. The small bowel loops have a normal caliber. Moderate stool burden identified within the colon. No discrete mass identified. Vascular/Lymphatic: Aortic atherosclerosis without aneurysm. 9 mm right lower quadrant ileocolic lymph node is identified, image 78/2. No enlarged retroperitoneal or mesenteric lymph nodes. Reproductive: Enlarged partially calcified fibroid uterus is again noted. No adnexal mass identified. Other: No ascites or focal fluid collections. Musculoskeletal: No acute or significant osseous findings. IMPRESSION: 1. Scattered, indeterminate small pulmonary nodules are identified within both lungs measuring up to 5 mm. Cannot rule out metastatic disease. 2. Indeterminate right adrenal gland nodule measures 1.2 cm. A more definitive assessment of this nodule may be obtained with adrenal protocol MRI or CT. 3. No specific findings to suggest metastatic disease within the abdomen or pelvis. 4. Hiatal hernia 5.  Aortic Atherosclerosis (ICD10-I70.0). 6. Age-indeterminate T11 compression fracture. Electronically Signed   By: Kerby Moors M.D.   On: 06/19/2017 15:48   Ct Abdomen Pelvis W Contrast  Result Date: 06/19/2017 CLINICAL DATA:  New diagnosis of colon cancer. EXAM: CT CHEST, ABDOMEN, AND PELVIS WITH CONTRAST TECHNIQUE: Multidetector CT imaging of the chest, abdomen and pelvis was performed following the standard protocol during bolus administration of intravenous contrast. CONTRAST:  156mL ISOVUE-300 IOPAMIDOL (ISOVUE-300) INJECTION 61% COMPARISON:  None. FINDINGS: CT CHEST FINDINGS Cardiovascular: The heart size is normal. Aortic atherosclerosis noted. No pericardial effusion. Mediastinum/Nodes: Bilateral low-attenuation thyroid nodules identified. The largest is on the right measuring 1.4 cm, image 5/2. The trachea appears patent and is midline.  Large hiatal hernia. No mediastinal or hilar adenopathy. Lungs/Pleura: No pleural effusion.  Noncalcified nodule in the right upper lobe measures 5 mm, image 40/6. Superior segment of right lower lobe pulmonary nodule measures 4 mm, image 31/6. Anterior left upper lobe lung nodule measures 4 mm, image 57/6. In the right lower lobe there is a 3 mm nodule, image 90/6. Scattered small pulmonary nodules are also noted within the right lung. Index right upper lobe lung nodule measures 3 mm, image 63/6. In the right middle lobe there is a 3 mm lung nodule, image 88/6. And in the right lower lobe there is a nodule measuring 3 mm, image 71/6. Musculoskeletal: Degenerative disc disease noted within the thoracic spine. There is a compression fracture and within the T11 vertebra which is new from 10/31/2015. There are no aggressive lytic or sclerotic bone lesions. CT ABDOMEN PELVIS FINDINGS Hepatobiliary: Within segment 4B of the liver there is a indeterminate low-density structure measuring 6 mm, image 55/2. This is too small to reliably characterize. No additional focal liver abnormalities. The gallbladder appears normal. No biliary dilatation. Pancreas: Unremarkable appearance of the pancreas. Spleen: Spleen normal. Adrenals/Urinary Tract: Indeterminate nodule in the right adrenal gland measures 1.2 cm and 78 HU, image 49/2. Unremarkable appearance of the left adrenal gland. 2.3 cm simple appearing right kidney cysts noted there is moderate to marked distension of the urinary bladder. Stomach/Bowel: Large hiatal which contains greater than 50% intrathoracic stomach. The small bowel loops have a normal caliber. Moderate stool burden identified within the colon. No discrete mass identified. Vascular/Lymphatic: Aortic atherosclerosis without aneurysm. 9 mm right lower quadrant ileocolic lymph node is identified, image 78/2. No enlarged retroperitoneal or mesenteric lymph nodes. Reproductive: Enlarged partially calcified fibroid  uterus is again noted. No adnexal mass identified. Other: No ascites or focal fluid collections. Musculoskeletal: No acute or significant osseous findings. IMPRESSION: 1. Scattered, indeterminate small pulmonary nodules are identified within both lungs measuring up to 5 mm. Cannot rule out metastatic disease. 2. Indeterminate right adrenal gland nodule measures 1.2 cm. A more definitive assessment of this nodule may be obtained with adrenal protocol MRI or CT. 3. No specific findings to suggest metastatic disease within the abdomen or pelvis. 4. Hiatal hernia 5.  Aortic Atherosclerosis (ICD10-I70.0). 6. Age-indeterminate T11 compression fracture. Electronically Signed   By: Kerby Moors M.D.   On: 06/19/2017 15:48   Nm Pet Image Initial (pi) Skull Base To Thigh  Result Date: 07/02/2017 CLINICAL DATA:  Initial treatment strategy for ascending colon cancer. EXAM: NUCLEAR MEDICINE PET SKULL BASE TO THIGH TECHNIQUE: 7.9 mCi F-18 FDG was injected intravenously. Full-ring PET imaging was performed from the skull base to thigh after the radiotracer. CT data was obtained and used for attenuation correction and anatomic localization. Fasting blood glucose: 103 mg/dl COMPARISON:  06/18/2017 CT scan FINDINGS: Mediastinal blood pool activity: SUV max 2.0 NECK: Ill-defined hypodense nodule inferiorly in the left thyroid lobe about 1.5 cm in diameter, with faintly accentuated metabolic activity with maximum SUV 4.2. On the contralateral side there is some faint activity within or along the margin of the thyroid gland adjacent to the jugular vein with maximum SUV 5.6, I am uncertain whether this is due to a peripheral thyroid nodule or indistinct nodal activity along the lateral margin of the right thyroid lobe. Incidental CT findings: none CHEST: The tiny lung nodules shown on the prior CT scan are not hypermetabolic but are below sensitive PET-CT size thresholds. No hypermetabolic adenopathy. Incidental CT findings: Aortic  arch and branch vessel atherosclerotic vascular disease. Mild cardiomegaly. Moderate to large hiatal  hernia with some associated passive atelectasis. ABDOMEN/PELVIS: Hypermetabolic mass in the vicinity of the ileocecal valve, maximum SUV 17.9, compatible with malignancy. No findings of hepatic metastatic disease or other additional abnormal hypermetabolic activity in the abdomen. Known of the small regional lymph nodes are recognizably hypermetabolic although there are few small scattered pericecal lymph nodes including a 0.9 cm lymph node on image 132/4. Incidental CT findings: The small right adrenal mass noted on the prior exam is not hypermetabolic but is indeterminate based on density characteristics. Exophytic fluid density right kidney upper pole lesion posteriorly favoring cyst. Aortoiliac atherosclerotic vascular disease. Mildly thick-walled urinary bladder, possibly partially due to nondistention, but cystitis is not excluded. Calcified uterine fibroids. Faint calcifications along the left ovary. SKELETON: No significant abnormal hypermetabolic activity in this region. Incidental CT findings: Dextroconvex lumbar scoliosis. Degenerative arthropathy of both hips. Degenerative glenohumeral arthropathy. Known T11 compression fracture without abnormal hypermetabolic activity. IMPRESSION: 1. Hypermetabolic colon mass in the vicinity of the ileocecal valve, maximum SUV 17.9, compatible with malignancy. There several small adjacent lymph nodes measuring up to 0.9 cm in short axis, these lymph nodes are not recognizably hypermetabolic. 2. No hypermetabolic activity associated with the tiny pulmonary nodules, although these nodules are below sensitive PET-CT size thresholds and accordingly may warrant surveillance. 3. Subtle accentuated metabolic activity in a left inferior thyroid lobe nodule. There is also some accentuated activity along the periphery of the right thyroid lobe, which could be from a peripheral  thyroid lesion or conceivably an adjacent lymph node (tissue planes in this vicinity are indistinct). Thyroid ultrasound is recommended for further investigation. 4. Other imaging findings of potential clinical significance: Aortic Atherosclerosis (ICD10-I70.0). Mild cardiomegaly. Moderate to large sized hiatal hernia. Small right adrenal mass is technically nonspecific based on density but is not hypermetabolic and accordingly is probably benign. Mildly thick-walled urinary bladder, cystitis not excluded. Calcified uterine fibroids. Known T11 compression fracture without abnormal hypermetabolic activity. Electronically Signed   By: Van Clines M.D.   On: 07/02/2017 07:34    ASSESSMENT & PLAN:   84 yo caucasian female with   1) Severe Microcytic Anemia s/p PRBc transfusion in sept 2017.  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 Lab Results  Component Value Date   IRON 48 05/19/2017   TIBC 350 05/19/2017   IRONPCTSAT 14 (L) 05/19/2017   (Iron and TIBC)  Lab Results  Component Value Date   FERRITIN 36 07/13/2017   3) B12 deficiency - antiparietal cell and anti IF ab neg. B12 levels adequate with current replacement.  PLAN -I discussed goal is to maintain Ferritin > 100 and iron saturation at 20%.  -Treatment would call for more aggressive intervention -- IV Injectafer x 2 doses (Tylenol/Benadryl- premedications) -continue vit B complex 1 tab po daily to support accelerated hematopoiesis.  4. 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  -Discussed the concerns of local complications that may inform her choice of surgery -Cardiology  clearance from PCP  -Recommend discussing surgery with surgeon as soon as she is able to -Would like to see pt back 2 weeks after surgery if she chooses to elect this  -IV Injectafer weekly x 2 doses  5) . Patient  Active Problem List   Diagnosis Date Noted  . HCAP (healthcare-associated pneumonia) 10/31/2015  . Iron deficiency anemia 10/20/2015  . B12 deficiency anemia 10/20/2015  . Symptomatic anemia 10/19/2015  . UTI (urinary tract infection) 07/26/2014  . TGA (transient global amnesia) 07/25/2014  . Cardiac dysrhythmia 02/06/2010  . PALPITATIONS 02/06/2010  . CHICKENPOX, HX OF 09/07/2008  . Essential hypertension, benign 05/22/2008  . PSVT 03/27/2008  . OBESITY, UNSPECIFIED 03/26/2008  . Abnormal involuntary movement 03/26/2008  . AMNESIA, TRANSIENT GLOBAL 01/10/2007  -advised continued f/u with PCP for management of her other medical co-morbids   IV injectafer weekly x 2 doses RTC with Dr Irene Limbo in 8 weeks with labs    All of the patients questions were answered with apparent satisfaction. The patient knows to call the clinic with any problems, questions or concerns.  . The total time spent in the appointment was 25 minutes and more than 50% was on counseling and direct patient cares.   Sullivan Lone MD [place] AAHIVMS [facility] Hematology/Oncology Physician [facility]  (Office):       [phone] (Work cell):  [phone] (Fax):           [phone]  I, Baldwin [place], am acting as a scribe for Dr Irene Limbo.   .I have reviewed the above documentation for accuracy and completeness, and I agree with the above. Brunetta Genera MD

## 2017-07-13 ENCOUNTER — Inpatient Hospital Stay: Payer: MEDICARE | Attending: Hematology | Admitting: Hematology

## 2017-07-13 ENCOUNTER — Inpatient Hospital Stay: Payer: MEDICARE

## 2017-07-13 VITALS — BP 135/59 | HR 55 | Temp 98.3°F | Resp 16 | Ht 62.0 in | Wt 156.9 lb

## 2017-07-13 DIAGNOSIS — D509 Iron deficiency anemia, unspecified: Secondary | ICD-10-CM

## 2017-07-13 DIAGNOSIS — C189 Malignant neoplasm of colon, unspecified: Secondary | ICD-10-CM

## 2017-07-13 DIAGNOSIS — R05 Cough: Secondary | ICD-10-CM | POA: Insufficient documentation

## 2017-07-13 DIAGNOSIS — Z79899 Other long term (current) drug therapy: Secondary | ICD-10-CM | POA: Diagnosis not present

## 2017-07-13 DIAGNOSIS — Z9189 Other specified personal risk factors, not elsewhere classified: Secondary | ICD-10-CM

## 2017-07-13 DIAGNOSIS — G454 Transient global amnesia: Secondary | ICD-10-CM | POA: Insufficient documentation

## 2017-07-13 DIAGNOSIS — I10 Essential (primary) hypertension: Secondary | ICD-10-CM

## 2017-07-13 DIAGNOSIS — E041 Nontoxic single thyroid nodule: Secondary | ICD-10-CM | POA: Diagnosis not present

## 2017-07-13 DIAGNOSIS — D259 Leiomyoma of uterus, unspecified: Secondary | ICD-10-CM | POA: Diagnosis not present

## 2017-07-13 DIAGNOSIS — D538 Other specified nutritional anemias: Secondary | ICD-10-CM | POA: Diagnosis not present

## 2017-07-13 DIAGNOSIS — E279 Disorder of adrenal gland, unspecified: Secondary | ICD-10-CM | POA: Diagnosis not present

## 2017-07-13 DIAGNOSIS — N281 Cyst of kidney, acquired: Secondary | ICD-10-CM | POA: Insufficient documentation

## 2017-07-13 DIAGNOSIS — R251 Tremor, unspecified: Secondary | ICD-10-CM | POA: Insufficient documentation

## 2017-07-13 DIAGNOSIS — I517 Cardiomegaly: Secondary | ICD-10-CM | POA: Diagnosis not present

## 2017-07-13 DIAGNOSIS — R591 Generalized enlarged lymph nodes: Secondary | ICD-10-CM | POA: Insufficient documentation

## 2017-07-13 DIAGNOSIS — I700 Atherosclerosis of aorta: Secondary | ICD-10-CM | POA: Insufficient documentation

## 2017-07-13 DIAGNOSIS — I471 Supraventricular tachycardia: Secondary | ICD-10-CM | POA: Diagnosis not present

## 2017-07-13 LAB — CBC WITH DIFFERENTIAL/PLATELET
BASOS ABS: 0 10*3/uL (ref 0.0–0.1)
BASOS PCT: 0 %
Eosinophils Absolute: 0.3 10*3/uL (ref 0.0–0.5)
Eosinophils Relative: 3 %
HEMATOCRIT: 32.5 % — AB (ref 34.8–46.6)
HEMOGLOBIN: 10.2 g/dL — AB (ref 11.6–15.9)
Lymphocytes Relative: 19 %
Lymphs Abs: 1.4 10*3/uL (ref 0.9–3.3)
MCH: 28.4 pg (ref 25.1–34.0)
MCHC: 31.4 g/dL — ABNORMAL LOW (ref 31.5–36.0)
MCV: 90.5 fL (ref 79.5–101.0)
MONO ABS: 0.7 10*3/uL (ref 0.1–0.9)
Monocytes Relative: 9 %
NEUTROS ABS: 5.2 10*3/uL (ref 1.5–6.5)
NEUTROS PCT: 69 %
Platelets: 396 10*3/uL (ref 145–400)
RBC: 3.59 MIL/uL — AB (ref 3.70–5.45)
RDW: 14.3 % (ref 11.2–14.5)
WBC: 7.6 10*3/uL (ref 3.9–10.3)

## 2017-07-13 LAB — RETICULOCYTES
RBC.: 3.59 MIL/uL — AB (ref 3.70–5.45)
RETIC COUNT ABSOLUTE: 75.4 10*3/uL (ref 33.7–90.7)
Retic Ct Pct: 2.1 % (ref 0.7–2.1)

## 2017-07-13 LAB — FERRITIN: FERRITIN: 36 ng/mL (ref 9–269)

## 2017-07-15 ENCOUNTER — Telehealth: Payer: Self-pay

## 2017-07-15 NOTE — Telephone Encounter (Signed)
Returned patient call concerning upcoming appointments that was not scheduled. Called the [facility] and had appointment scheduled. Mailed aq letter and calender of these appointments.

## 2017-07-16 ENCOUNTER — Telehealth: Payer: Self-pay | Admitting: *Deleted

## 2017-07-16 NOTE — Telephone Encounter (Signed)
Identified caller (daughter) noted on 03-13-2016 [place] document, denies desire to leave voicemail.  "I have questions.  Will discuss this with my farther."

## 2017-07-17 ENCOUNTER — Inpatient Hospital Stay: Payer: MEDICARE

## 2017-07-17 ENCOUNTER — Telehealth: Payer: Self-pay | Admitting: Cardiology

## 2017-07-17 VITALS — BP 117/57 | HR 48 | Temp 98.1°F | Resp 18

## 2017-07-17 DIAGNOSIS — D500 Iron deficiency anemia secondary to blood loss (chronic): Secondary | ICD-10-CM

## 2017-07-17 DIAGNOSIS — D538 Other specified nutritional anemias: Secondary | ICD-10-CM | POA: Diagnosis not present

## 2017-07-17 DIAGNOSIS — D649 Anemia, unspecified: Secondary | ICD-10-CM | POA: Diagnosis not present

## 2017-07-17 DIAGNOSIS — I471 Supraventricular tachycardia: Secondary | ICD-10-CM | POA: Diagnosis not present

## 2017-07-17 DIAGNOSIS — D509 Iron deficiency anemia, unspecified: Secondary | ICD-10-CM | POA: Diagnosis not present

## 2017-07-17 DIAGNOSIS — C189 Malignant neoplasm of colon, unspecified: Secondary | ICD-10-CM | POA: Diagnosis not present

## 2017-07-17 DIAGNOSIS — Z79899 Other long term (current) drug therapy: Secondary | ICD-10-CM | POA: Diagnosis not present

## 2017-07-17 DIAGNOSIS — C182 Malignant neoplasm of ascending colon: Secondary | ICD-10-CM | POA: Diagnosis not present

## 2017-07-17 DIAGNOSIS — I10 Essential (primary) hypertension: Secondary | ICD-10-CM | POA: Diagnosis not present

## 2017-07-17 MED ORDER — DIPHENHYDRAMINE HCL 25 MG PO CAPS
ORAL_CAPSULE | ORAL | Status: AC
  Filled 2017-07-17: qty 1

## 2017-07-17 MED ORDER — ACETAMINOPHEN 325 MG PO TABS
ORAL_TABLET | ORAL | Status: AC
  Filled 2017-07-17: qty 2

## 2017-07-17 MED ORDER — ACETAMINOPHEN 325 MG PO TABS
650.0000 mg | ORAL_TABLET | Freq: Once | ORAL | Status: AC
  Administered 2017-07-17: 650 mg via ORAL

## 2017-07-17 MED ORDER — DIPHENHYDRAMINE HCL 25 MG PO TABS
25.0000 mg | ORAL_TABLET | Freq: Once | ORAL | Status: AC
  Administered 2017-07-17: 25 mg via ORAL
  Filled 2017-07-17: qty 1

## 2017-07-17 MED ORDER — SODIUM CHLORIDE 0.9 % IV SOLN
750.0000 mg | Freq: Once | INTRAVENOUS | Status: AC
  Administered 2017-07-17: 750 mg via INTRAVENOUS
  Filled 2017-07-17: qty 15

## 2017-07-17 NOTE — Patient Instructions (Signed)
Ferric carboxymaltose injection What is this medicine? FERRIC CARBOXYMALTOSE (ferr-ik car-box-ee-mol-toes) is an iron complex. Iron is used to make healthy red blood cells, which carry oxygen and nutrients throughout the body. This medicine is used to treat anemia in people with chronic kidney disease or people who cannot take iron by mouth. This medicine may be used for other purposes; ask your health care provider or pharmacist if you have questions. COMMON BRAND NAME(S): Injectafer What should I tell my health care provider before I take this medicine? They need to know if you have any of these conditions: -anemia not caused by low iron levels -high levels of iron in the blood -liver disease -an unusual or allergic reaction to iron, other medicines, foods, dyes, or preservatives -pregnant or trying to get pregnant -breast-feeding How should I use this medicine? This medicine is for infusion into a vein. It is given by a health care professional in a hospital or clinic setting. Talk to your pediatrician regarding the use of this medicine in children. Special care may be needed. Overdosage: If you think you have taken too much of this medicine contact a poison control center or emergency room at once. NOTE: This medicine is only for you. Do not share this medicine with others. What if I miss a dose? It is important not to miss your dose. Call your doctor or health care professional if you are unable to keep an appointment. What may interact with this medicine? Do not take this medicine with any of the following medications: -deferoxamine -dimercaprol -other iron products This medicine may also interact with the following medications: -chloramphenicol -deferasirox This list may not describe all possible interactions. Give your health care provider a list of all the medicines, herbs, non-prescription drugs, or dietary supplements you use. Also tell them if you smoke, drink alcohol, or use  illegal drugs. Some items may interact with your medicine. What should I watch for while using this medicine? Visit your doctor or health care professional regularly. Tell your doctor if your symptoms do not start to get better or if they get worse. You may need blood work done while you are taking this medicine. You may need to follow a special diet. Talk to your doctor. Foods that contain iron include: whole grains/cereals, dried fruits, beans, or peas, leafy green vegetables, and organ meats (liver, kidney). What side effects may I notice from receiving this medicine? Side effects that you should report to your doctor or health care professional as soon as possible: -allergic reactions like skin rash, itching or hives, swelling of the face, lips, or tongue -breathing problems -changes in blood pressure -feeling faint or lightheaded, falls -flushing, sweating, or hot feelings Side effects that usually do not require medical attention (report to your doctor or health care professional if they continue or are bothersome): -changes in taste -constipation -dizziness -headache -nausea -pain, redness, or irritation at site where injected -vomiting This list may not describe all possible side effects. Call your doctor for medical advice about side effects. You may report side effects to FDA at 1-800-FDA-1088. Where should I keep my medicine? This drug is given in a hospital or clinic and will not be stored at home. NOTE: This sheet is a summary. It may not cover all possible information. If you have questions about this medicine, talk to your doctor, pharmacist, or health care provider.  2018 Elsevier/Gold Standard (2015-03-01 11:20:47)  

## 2017-07-17 NOTE — Telephone Encounter (Signed)
New Message:    Daughter called and wanted  Dr Percival Spanish to know that the pt have Colon Cancer and she is going to have surgery.

## 2017-07-22 ENCOUNTER — Ambulatory Visit: Payer: MEDICARE

## 2017-07-22 ENCOUNTER — Encounter: Payer: Self-pay | Admitting: Physician Assistant

## 2017-07-22 ENCOUNTER — Ambulatory Visit (INDEPENDENT_AMBULATORY_CARE_PROVIDER_SITE_OTHER): Payer: MEDICARE | Admitting: Physician Assistant

## 2017-07-22 ENCOUNTER — Encounter (HOSPITAL_COMMUNITY): Payer: MEDICARE

## 2017-07-22 VITALS — BP 124/62 | HR 66 | Ht 62.0 in | Wt 156.0 lb

## 2017-07-22 DIAGNOSIS — I10 Essential (primary) hypertension: Secondary | ICD-10-CM

## 2017-07-22 DIAGNOSIS — G250 Essential tremor: Secondary | ICD-10-CM

## 2017-07-22 DIAGNOSIS — Z01818 Encounter for other preprocedural examination: Secondary | ICD-10-CM

## 2017-07-22 DIAGNOSIS — R011 Cardiac murmur, unspecified: Secondary | ICD-10-CM | POA: Diagnosis not present

## 2017-07-22 NOTE — Patient Instructions (Signed)
Medication Instructions:  Your physician recommends that you continue on your current medications as directed. Please refer to the Current Medication list given to you today.  Labwork: NONE   Testing/Procedures: Your physician has requested that you have an STAT echocardiogram. Echocardiography is a painless test that uses sound waves to create images of your heart. It provides your doctor with information about the size and shape of your heart and how well your heart's chambers and valves are working. This procedure takes approximately one hour. There are no restrictions for this procedure. [place] wants you to follow-up in: [place].  You will receive a reminder letter in the mail two months in advance. If you don't receive a letter, please call our office to schedule the follow-up appointment.  Any Other Special Instructions Will Be Listed Below (If Applicable).     If you need a refill on your cardiac medications before your next appointment, please call your pharmacy.

## 2017-07-22 NOTE — Progress Notes (Signed)
Cardiology Office Note    Date:  07/22/2017   ID:  [name] [name], DOB May 08, 1927, MRN [id number]  PCP:  Lawerance Cruel, MD  Cardiologist:  Dr. Percival Spanish  Chief Complaint  Patient presents with  . Medical Clearance    seen for Dr. Percival Spanish, preoperative clearance for colon surgery requested by Dr. Drue Flirt of [facility]     History of Present Illness:  [name] [name] is a 84 y.o. 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  She does not have prior history of coronary artery disease.  She has been followed up by Dr. Percival Spanish mainly for treatment of hypertension.  She has a prior history of arrhythmia in the past, this appears to have been well controlled on metoprolol.  Previous echocardiogram in December 2008 showed EF 55 to 60%, no significant valvular issue.  She has a prior history of GI bleed with hemoglobin down to 7.4 in September 2017.  She was transfused with 1 unit of packed red blood cell and discharged to follow-up with GI as outpatient.  She has refused further GI work-up since.  Patient presents today for preoperative clearance referred by Dr. Renelda Mom.  She was newly diagnosed with malignant neoplasm of the colon.  PET scan and a CT completed on 07/01/2017 showed 1 hypermetabolic mass in the vicinity of the ileocecal valve compatible with malignancy.  Patient also had subtle accentuated metabolic activity in the left inferior thyroid node, thyroid ultrasound was recommended for further investigation.  CT image also revealed aortic atherosclerosis with moderate to large sized hiatal hernia.  Small right adrenal mass which is not hypermetabolic and probably benign.   Patient presents today for cardiology office visit.  She says that she has chronic fatigue and it does not do much activity.  She does not climb stairs as she has a mechanical lift at home.  She denies any recent chest discomfort or or  significant shortness of breath with walking.  She has chronic right ankle edema, however this is quite mild and it does not need any diuretic therapy.  EKG today did not show any significant ischemic changes.  On physical exam, she has a heart murmur near the aortic valve area, I recommended repeat echocardiogram.  I discussed his case with DOD Dr. Martinique, a stress test at this time likely will not lower her overall surgical risks and significantly, given the lack of chest discomfort, we would not recommend a stress test at this time.  If the echocardiogram is normal and valve disease is not significant, she would be cleared to proceed with surgery from cardiology perspective.  However based on her age and her baseline frailty, my main concern is her very good recovery time after the surgery, even if her cardiac condition is stable, she likely will be at least a moderate risk patient going through a high risk surgery.    Past Medical History:  Diagnosis Date  . Atrial tachycardia ([place])   . HTN (hypertension)   . Jaundice due to hepatitis    at age 41 or 74  . Transient global amnesia    AMS  . Tremors of nervous system    benign familial    Past Surgical History:  Procedure Laterality Date  . ABDOMINAL HERNIA REPAIR    . BACK SURGERY     years ago  . TONSILLECTOMY      Current Medications: Outpatient Medications Prior to  Visit  Medication Sig Dispense Refill  . amLODipine (NORVASC) 10 MG tablet TAKE 1 TABLET DAILY 90 tablet 3  . cloNIDine (CATAPRES) 0.2 MG tablet TAKE 1 TABLET EVERY MORNINGAND 1 AND 1/2 TABLETS AT   BEDTIME 225 tablet 3  . lubiprostone (AMITIZA) 8 MCG capsule Take 24 mcg by mouth 2 (two) times daily with a meal.    . magnesium citrate SOLN Take 1 Bottle by mouth as needed.     . metoprolol tartrate (LOPRESSOR) 25 MG tablet TAKE 1/2 TABLET TWICE A DAY 90 tablet 0  . nitrofurantoin, macrocrystal-monohydrate, (MACROBID) 100 MG capsule Take 1 capsule (100 mg total) by  mouth daily. Prevention of bladder infection 90 capsule 2  . UNABLE TO FIND [place] Stair Lift 1 Device 0   No facility-administered medications prior to visit.      Allergies:   Aspirin; Codeine; Hydrochlorothiazide; Penicillins; Sulfa antibiotics; Sulfonamide derivatives; Biotin; Levofloxacin; Macrodantin [nitrofurantoin]; and Latex   Social History   Socioeconomic History  . Marital status: Married    Spouse name: Not on file  . Number of children: Not on file  . Years of education: Not on file  . Highest education level: Not on file  Occupational History  . Not on file  Social Needs  . Financial resource strain: Not on file  . Food insecurity:    Worry: Not on file    Inability: Not on file  . Transportation needs:    Medical: Not on file    Non-medical: Not on file  Tobacco Use  . Smoking status: Never Smoker  . Smokeless tobacco: Never Used  . Tobacco comment: + prior 2nd hand exposure from spouse smoking  Substance and Sexual Activity  . Alcohol use: No    Alcohol/week: 0.0 oz  . Drug use: No  . Sexual activity: Never  Lifestyle  . Physical activity:    Days per week: Not on file    Minutes per session: Not on file  . Stress: Not on file  Relationships  . Social connections:    Talks on phone: Not on file    Gets together: Not on file    Attends religious service: Not on file    Active member of club or organization: Not on file    Attends meetings of clubs or organizations: Not on file    Relationship status: Not on file  Other Topics Concern  . Not on file  Social History Narrative  . Not on file     Family History:  The patient's family history includes Arthritis in her sister; Hypertension in her mother; Lung cancer in her mother; Other in her father and sister; Parkinson's disease in her mother.   ROS:   Please see the history of present illness.    ROS All other systems reviewed and are negative.   PHYSICAL EXAM:   VS:  BP 124/62   Pulse 66    Ht 5\' 2"  (1.575 m)   Wt 156 lb (70.8 kg)   BMI 28.53 kg/m    GEN: Well nourished, well developed, in no acute distress  HEENT: normal  Neck: no JVD, carotid bruits, or masses Cardiac: RRR; no murmurs, rubs, or gallops. Trace R ankle edema  Respiratory:  clear to auscultation bilaterally, normal work of breathing GI: soft, nontender, nondistended, + BS MS: no deformity or atrophy  Skin: warm and dry, no rash Neuro:  Alert and Oriented x 3, Strength and sensation are intact Psych: euthymic mood, full affect  Wt Readings from Last 3 Encounters:  07/22/17 156 lb (70.8 kg)  07/13/17 156 lb 14.4 oz (71.2 kg)  06/22/17 156 lb 9.6 oz (71 kg)      Studies/Labs Reviewed:   EKG:  EKG is ordered today.  The ekg ordered today demonstrates normal sinus rhythm, nonspecific T wave changes  Recent Labs: 06/22/2017: ALT 7; BUN 14; Creatinine 0.78; Potassium 3.8; Sodium 139 07/13/2017: Hemoglobin 10.2; Platelets 396   Lipid Panel    Component Value Date/Time   CHOL (H) 01/10/2007 0003    236        ATP III CLASSIFICATION:  <200     mg/dL   Desirable  200-239  mg/dL   Borderline High  >=240    mg/dL   High   TRIG 132 01/10/2007 0003   HDL 42 01/10/2007 0003   CHOLHDL 5.6 01/10/2007 0003   VLDL 26 01/10/2007 0003   LDLCALC (H) 01/10/2007 0003    168        Total Cholesterol/HDL:CHD Risk Coronary Heart Disease Risk Table                     Men   Women  1/2 Average Risk   3.4   3.3    Additional studies/ records that were reviewed today include:   Echo 01/19/2017 SUMMARY - Overall left ventricular systolic function was normal. Left    ventricular ejection fraction was estimated , range being 55    % to 60 %. There were no left ventricular regional wall    motion abnormalities. - Aortic valve thickness was mildly increased. - The left atrium was mildly dilated.    ASSESSMENT:    1. Pre-operative clearance   2. Heart murmur   3. Tremor, hereditary, benign     4. Essential hypertension      PLAN:  In order of problems listed above:  1. Preoperative clearance prior to surgery for colon cancer: Patient does not have history of coronary artery disease, she denies any recent chest pain or shortness of breath.  Majority of the risk associated with this surgery likely is related to her advanced age and potentially prolonged recovery time.  From her heart perspective, she has been quite stable.  She does appears to have a murmur on physical exam, I recommended a echocardiogram prior to final clearance.  I discussed the case with DOD Dr. Martinique, a stress test at this time likely will not significantly decrease her overall risk going for the surgery.  If echocardiogram is normal and it does not have severe valve issue, she will be cleared to proceed with surgery.   2. Atrial tachycardia: Quite well controlled and no recent palpitation.  3. Essential tremors: She has chronic history of essential tremors, her mother has Parkinson's disease, however she has never been formally diagnosed with Parkinson's disease  4. Hypertension: Blood pressure stable    Medication Adjustments/Labs and Tests Ordered: Current medicines are reviewed at length with the patient today.  Concerns regarding medicines are outlined above.  Medication changes, Labs and Tests ordered today are listed in the Patient Instructions below. Patient Instructions  Medication Instructions:  Your physician recommends that you continue on your current medications as directed. Please refer to the Current Medication list given to you today.  Labwork: NONE   Testing/Procedures: Your physician has requested that you have an STAT echocardiogram. Echocardiography is a painless test that uses sound waves to create images of your heart. It provides your  doctor with information about the size and shape of your heart and how well your heart's chambers and valves are working. This procedure takes  approximately one hour. There are no restrictions for this procedure. [place] wants you to follow-up in: [place].  You will receive a reminder letter in the mail two months in advance. If you don't receive a letter, please call our office to schedule the follow-up appointment.  Any Other Special Instructions Will Be Listed Below (If Applicable).     If you need a refill on your cardiac medications before your next appointment, please call your pharmacy.     Hilbert Corrigan, [place]  07/22/2017 5:54 PM    [facility] [address], [place], [place]  [address] Phone: [phone]; Fax: [phone]

## 2017-07-23 DIAGNOSIS — C189 Malignant neoplasm of colon, unspecified: Secondary | ICD-10-CM | POA: Diagnosis not present

## 2017-07-24 ENCOUNTER — Inpatient Hospital Stay: Payer: MEDICARE

## 2017-07-24 VITALS — BP 138/58 | HR 60 | Temp 98.1°F | Resp 17

## 2017-07-24 DIAGNOSIS — I471 Supraventricular tachycardia: Secondary | ICD-10-CM | POA: Diagnosis not present

## 2017-07-24 DIAGNOSIS — D538 Other specified nutritional anemias: Secondary | ICD-10-CM | POA: Diagnosis not present

## 2017-07-24 DIAGNOSIS — D509 Iron deficiency anemia, unspecified: Secondary | ICD-10-CM | POA: Diagnosis not present

## 2017-07-24 DIAGNOSIS — D500 Iron deficiency anemia secondary to blood loss (chronic): Secondary | ICD-10-CM

## 2017-07-24 DIAGNOSIS — I10 Essential (primary) hypertension: Secondary | ICD-10-CM | POA: Diagnosis not present

## 2017-07-24 DIAGNOSIS — C189 Malignant neoplasm of colon, unspecified: Secondary | ICD-10-CM | POA: Diagnosis not present

## 2017-07-24 DIAGNOSIS — Z79899 Other long term (current) drug therapy: Secondary | ICD-10-CM | POA: Diagnosis not present

## 2017-07-24 MED ORDER — DIPHENHYDRAMINE HCL 25 MG PO CAPS
ORAL_CAPSULE | ORAL | Status: AC
  Filled 2017-07-24: qty 1

## 2017-07-24 MED ORDER — SODIUM CHLORIDE 0.9 % IV SOLN
750.0000 mg | Freq: Once | INTRAVENOUS | Status: AC
  Administered 2017-07-24: 750 mg via INTRAVENOUS
  Filled 2017-07-24: qty 15

## 2017-07-24 MED ORDER — SODIUM CHLORIDE 0.9 % IV SOLN
INTRAVENOUS | Status: DC
  Administered 2017-07-24: 16:00:00 via INTRAVENOUS

## 2017-07-24 MED ORDER — DIPHENHYDRAMINE HCL 25 MG PO CAPS
ORAL_CAPSULE | ORAL | Status: AC
Start: 2017-07-24 — End: ?
  Filled 2017-07-24: qty 1

## 2017-07-24 MED ORDER — ACETAMINOPHEN 325 MG PO TABS
ORAL_TABLET | ORAL | Status: AC
  Filled 2017-07-24: qty 2

## 2017-07-24 MED ORDER — ACETAMINOPHEN 325 MG PO TABS
650.0000 mg | ORAL_TABLET | Freq: Once | ORAL | Status: AC
  Administered 2017-07-24: 650 mg via ORAL

## 2017-07-24 MED ORDER — DIPHENHYDRAMINE HCL 25 MG PO TABS
25.0000 mg | ORAL_TABLET | Freq: Once | ORAL | Status: AC
  Administered 2017-07-24: 25 mg via ORAL
  Filled 2017-07-24: qty 1

## 2017-07-24 NOTE — Patient Instructions (Signed)
Ferric carboxymaltose injection What is this medicine? FERRIC CARBOXYMALTOSE (ferr-ik car-box-ee-mol-toes) is an iron complex. Iron is used to make healthy red blood cells, which carry oxygen and nutrients throughout the body. This medicine is used to treat anemia in people with chronic kidney disease or people who cannot take iron by mouth. This medicine may be used for other purposes; ask your health care provider or pharmacist if you have questions. COMMON BRAND NAME(S): Injectafer What should I tell my health care provider before I take this medicine? They need to know if you have any of these conditions: -anemia not caused by low iron levels -high levels of iron in the blood -liver disease -an unusual or allergic reaction to iron, other medicines, foods, dyes, or preservatives -pregnant or trying to get pregnant -breast-feeding How should I use this medicine? This medicine is for infusion into a vein. It is given by a health care professional in a hospital or clinic setting. Talk to your pediatrician regarding the use of this medicine in children. Special care may be needed. Overdosage: If you think you have taken too much of this medicine contact a poison control center or emergency room at once. NOTE: This medicine is only for you. Do not share this medicine with others. What if I miss a dose? It is important not to miss your dose. Call your doctor or health care professional if you are unable to keep an appointment. What may interact with this medicine? Do not take this medicine with any of the following medications: -deferoxamine -dimercaprol -other iron products This medicine may also interact with the following medications: -chloramphenicol -deferasirox This list may not describe all possible interactions. Give your health care provider a list of all the medicines, herbs, non-prescription drugs, or dietary supplements you use. Also tell them if you smoke, drink alcohol, or use  illegal drugs. Some items may interact with your medicine. What should I watch for while using this medicine? Visit your doctor or health care professional regularly. Tell your doctor if your symptoms do not start to get better or if they get worse. You may need blood work done while you are taking this medicine. You may need to follow a special diet. Talk to your doctor. Foods that contain iron include: whole grains/cereals, dried fruits, beans, or peas, leafy green vegetables, and organ meats (liver, kidney). What side effects may I notice from receiving this medicine? Side effects that you should report to your doctor or health care professional as soon as possible: -allergic reactions like skin rash, itching or hives, swelling of the face, lips, or tongue -breathing problems -changes in blood pressure -feeling faint or lightheaded, falls -flushing, sweating, or hot feelings Side effects that usually do not require medical attention (report to your doctor or health care professional if they continue or are bothersome): -changes in taste -constipation -dizziness -headache -nausea -pain, redness, or irritation at site where injected -vomiting This list may not describe all possible side effects. Call your doctor for medical advice about side effects. You may report side effects to FDA at 1-800-FDA-1088. Where should I keep my medicine? This drug is given in a hospital or clinic and will not be stored at home. NOTE: This sheet is a summary. It may not cover all possible information. If you have questions about this medicine, talk to your doctor, pharmacist, or health care provider.  2018 Elsevier/Gold Standard (2015-03-01 11:20:47)  

## 2017-07-27 ENCOUNTER — Other Ambulatory Visit: Payer: Self-pay

## 2017-07-27 ENCOUNTER — Ambulatory Visit (HOSPITAL_COMMUNITY): Payer: MEDICARE | Attending: Cardiology

## 2017-07-27 DIAGNOSIS — I10 Essential (primary) hypertension: Secondary | ICD-10-CM | POA: Diagnosis not present

## 2017-07-27 DIAGNOSIS — I471 Supraventricular tachycardia: Secondary | ICD-10-CM | POA: Insufficient documentation

## 2017-07-27 DIAGNOSIS — I272 Pulmonary hypertension, unspecified: Secondary | ICD-10-CM | POA: Diagnosis not present

## 2017-07-27 DIAGNOSIS — I253 Aneurysm of heart: Secondary | ICD-10-CM | POA: Insufficient documentation

## 2017-07-27 DIAGNOSIS — I081 Rheumatic disorders of both mitral and tricuspid valves: Secondary | ICD-10-CM | POA: Diagnosis not present

## 2017-07-27 DIAGNOSIS — D500 Iron deficiency anemia secondary to blood loss (chronic): Secondary | ICD-10-CM

## 2017-07-27 DIAGNOSIS — Z01818 Encounter for other preprocedural examination: Secondary | ICD-10-CM | POA: Insufficient documentation

## 2017-07-27 DIAGNOSIS — R011 Cardiac murmur, unspecified: Secondary | ICD-10-CM | POA: Insufficient documentation

## 2017-07-29 ENCOUNTER — Encounter (HOSPITAL_COMMUNITY): Payer: MEDICARE

## 2017-07-29 ENCOUNTER — Encounter: Payer: Self-pay | Admitting: Physician Assistant

## 2017-08-01 ENCOUNTER — Other Ambulatory Visit: Payer: Self-pay | Admitting: Cardiology

## 2017-08-03 NOTE — Telephone Encounter (Signed)
Rx request sent to pharmacy.  

## 2017-08-17 NOTE — Progress Notes (Signed)
HEMATOLOGY/ONCOLOGY CLINIC NOTE  Date of Service:  08/19/17     Patient Care Team: Lawerance Cruel, MD as PCP - General  CHIEF COMPLAINTS/PURPOSE OF CONSULTATION:  Iron deficiency Anemia and B12 deficiency Newly diagnosed colon cancer  HISTORY OF PRESENTING ILLNESS:   [name] [name] is a wonderful 84 y.o. female who has been referred to [place] by Dr .Harrington Challenger, Dwyane Luo, MD for evaluation and management of microcytic anemia.  Patient has a h/o HTN, atrial tachycardia, , benign tremor who was noted to have a new severe microcytic anemia and weakness with a drop of hgb down to 7.4 in sept 2017. She was admitted to the hospital and received 1 unit of PRBC. She was noted to have severe new iron deficiency and also noted to have B12 deficiency. Her stool occult blood was apparently neg. Was discharged on PO iron and was taking it for a few months. Rpt stool studies with PCP were again noted to be hemoccult neg. She received B12 IM x 1 in the hospital but has not been on any B12 since then. Patient had refused a GI workup in the hospital and was to be seen by Dr Fuller Plan for GI workup in the outpatient setting but has refused and continues to refuse and GI workup.  Denies any overt GI bleeding. No melena no hematemesis no hematochezia. No nausea/vomiting or abdominal pain. No significant weight loss recently.  Her hgb improved some on Po iron but then started dropping again and so she was referred to [place] for consideration of IV iron replacement.  On CXR in the hospital she was incidentally noted to have a moderate to large hiatal hernia.   INTERVAL HISTORY   [name] [name] is here for fu of her newly diagnosed colon cancer. The patient's last visit with [place] was on 07/13/17. She is accompanied today by her husband. The pt reports that she is doing well overall.  The pt reports that she has canceled her right colectomy on 08/31/17 with Dr Renelda Mom. She notes that she has allergies to many  antibiotics, and doesn't tolerate pain medication well. She also notes that being anesthetized is a concern for her, she does not want to spend several days recovering in the hospital, and is concerned about needing a colostomy bag.   She notes that she sees red blood in her stools from her hemorrhoids and denies any abdominal pains. She notes that her weight continues to be stable and she is moving her bowels well.  The pt notes that at this time, she does not want to pursue chemotherapy. She would like to receive IV Iron as needed.   Lab results today (08/19/17) of CBC w/diff, CMP is as follows: all values are WNL except for RBC at 3.24, HGB at 9.5, HCT at 30.9, MCHC at 30.7, RDW at 16.1, Retic ct pct at 3.7%, Retic ct abs at 119.9k, Calcium at 8.6, AST at 13, Alk Phos at 161, Total Bilirubin at <0.2. Ferritin 08/19/17 is 140 Vitamin B12 on 08/19/17 is 375  On review of systems, pt reports blood in the stools, stable weight, moving her bowels well, and denies black stools, abdominal pains, fatigue, and any other symptoms.    MEDICAL HISTORY:  Past Medical History:  Diagnosis Date  . Atrial tachycardia ([place])   . HTN (hypertension)   . Jaundice due to hepatitis    at age 29 or 65  . Transient global amnesia    AMS  .  Tremors of nervous system    benign familial  Large Hiatal hernia HCAP 10/2015 Iron deficiency Anemia B12 deficiency.  SURGICAL HISTORY: Past Surgical History:  Procedure Laterality Date  . ABDOMINAL HERNIA REPAIR    . BACK SURGERY     years ago  . TONSILLECTOMY      SOCIAL HISTORY: Social History   Socioeconomic History  . Marital status: Married    Spouse name: Not on file  . Number of children: Not on file  . Years of education: Not on file  . Highest education level: Not on file  Occupational History  . Not on file  Social Needs  . Financial resource strain: Not on file  . Food insecurity:    Worry: Not on file    Inability: Not on file  .  Transportation needs:    Medical: Not on file    Non-medical: Not on file  Tobacco Use  . Smoking status: Never Smoker  . Smokeless tobacco: Never Used  . Tobacco comment: + prior 2nd hand exposure from spouse smoking  Substance and Sexual Activity  . Alcohol use: No    Alcohol/week: 0.0 oz  . Drug use: No  . Sexual activity: Never  Lifestyle  . Physical activity:    Days per week: Not on file    Minutes per session: Not on file  . Stress: Not on file  Relationships  . Social connections:    Talks on phone: Not on file    Gets together: Not on file    Attends religious service: Not on file    Active member of club or organization: Not on file    Attends meetings of clubs or organizations: Not on file    Relationship status: Not on file  . Intimate partner violence:    Fear of current or ex partner: Not on file    Emotionally abused: Not on file    Physically abused: Not on file    Forced sexual activity: Not on file  Other Topics Concern  . Not on file  Social History Narrative  . Not on file    FAMILY HISTORY: Family History  Problem Relation Age of Onset  . Lung cancer Mother   . Parkinson's disease Mother   . Hypertension Mother   . Other Father        blood clot  . Arthritis Sister   . Other Sister        spinal stenosis    ALLERGIES:  is allergic to aspirin; codeine; hydrochlorothiazide; penicillins; sulfa antibiotics; sulfonamide derivatives; biotin; levofloxacin; macrodantin [nitrofurantoin]; and latex.  MEDICATIONS:  Current Outpatient Medications  Medication Sig Dispense Refill  . amLODipine (NORVASC) 10 MG tablet TAKE 1 TABLET DAILY 90 tablet 3  . cloNIDine (CATAPRES) 0.2 MG tablet TAKE 1 TABLET EVERY MORNINGAND 1 AND 1/2 TABLETS AT   BEDTIME 225 tablet 3  . lubiprostone (AMITIZA) 8 MCG capsule Take 24 mcg by mouth 2 (two) times daily with a meal.    . magnesium citrate SOLN Take 1 Bottle by mouth as needed.     . metoprolol tartrate (LOPRESSOR) 25  MG tablet TAKE 1/2 TABLET TWICE A DAY 90 tablet 0   No current facility-administered medications for this visit.     REVIEW OF SYSTEMS:    A 10+ POINT REVIEW OF SYSTEMS WAS OBTAINED including neurology, dermatology, psychiatry, cardiac, respiratory, lymph, extremities, GI, GU, Musculoskeletal, constitutional, breasts, reproductive, HEENT.  All pertinent positives are noted in the HPI.  All others are negative.   PHYSICAL EXAMINATION:  ECOG PERFORMANCE STATUS: 2 - Symptomatic, <50% confined to bed  . Vitals:   08/19/17 1150  BP: (!) 135/56  Pulse: (!) 51  Resp: 18  Temp: 97.9 F (36.6 C)  SpO2: 100%   Filed Weights   08/19/17 1150  Weight: 156 lb 4.8 oz (70.9 kg)   .Body mass index is 28.59 kg/m.  GENERAL:alert, in no acute distress and comfortable SKIN: no acute rashes, no significant lesions EYES: conjunctiva are pink and non-injected, sclera anicteric OROPHARYNX: MMM, no exudates, no oropharyngeal erythema or ulceration NECK: supple, no JVD LYMPH:  no palpable lymphadenopathy in the cervical, axillary or inguinal regions LUNGS: clear to auscultation b/l with normal respiratory effort HEART: regular rate & rhythm ABDOMEN:  normoactive bowel sounds , non tender, not distended. No palpable hepatosplenomegaly.  Extremity: no pedal edema PSYCH: alert & oriented x 3 with fluent speech NEURO: no focal motor/sensory deficits   LABORATORY DATA:  I have reviewed the data as listed  . CBC Latest Ref Rng & Units 08/19/2017 07/13/2017 06/22/2017  WBC 3.9 - 10.3 K/uL 5.9 7.6 6.1  Hemoglobin 11.6 - 15.9 g/dL 9.5(L) 10.2(L) 12.2  Hematocrit 34.8 - 46.6 % 30.9(L) 32.5(L) 38.5  Platelets 145 - 400 K/uL 365 396 377    . CMP Latest Ref Rng & Units 08/19/2017 06/22/2017 06/12/2017  Glucose 70 - 99 mg/dL 98 138 96  BUN 8 - 23 mg/dL _0 Creatinine 0.44 - 1.00 mg/dL 0.71 0.78 0.71  Sodium 135 - 145 mmol/L 138 139 138  Potassium 3.5 - 5.1 mmol/L 4.3 3.8 4.0  Chloride 98 - 111 mmol/L  107 108 108  CO2 22 - 32 mmol/L _1 Calcium 8.9 - 10.3 mg/dL 8.6(L) 8.7 9.3  Total Protein 6.5 - 8.1 g/dL 6.5 6.9 7.7  Total Bilirubin 0.3 - 1.2 mg/dL <0.2(L) <0.2(L) 0.2  Alkaline Phos 38 - 126 U/L 161(H) 174(H) 177(H)  AST 15 - 41 U/L 13(L) 12 15  ALT 0 - 44 U/L _2 . Lab Results  Component Value Date   IRON 48 05/19/2017   TIBC 350 05/19/2017   IRONPCTSAT 14 (L) 05/19/2017   (Iron and TIBC)  Lab Results  Component Value Date   FERRITIN 36 07/13/2017    Component     Latest Ref Rng & Units 05/08/2016  Parietal Cell Ab     0.0 - 20.0 Units 3.5  Intrinsic Factor Abs, Serum     0.0 - 1.1 AU/mL 0.9      RADIOGRAPHIC STUDIES: I have personally reviewed the radiological images as listed and agreed with the findings in the report. No results found.  ASSESSMENT & PLAN:   83 yo caucasian female with   1) Severe Microcytic Anemia s/p PRBc transfusion in sept 2017.  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 Lab Results  Component Value Date   IRON 25 (L) 08/19/2017   TIBC 330 08/19/2017   IRONPCTSAT 8 (L) 08/19/2017   (Iron and TIBC)  Lab Results  Component Value Date   FERRITIN 140 08/19/2017   3) B12 deficiency - antiparietal cell and anti IF ab neg. B12 levels adequate with current replacement. B12 --- 375 PLAN -I discussed goal is to maintain Ferritin > 100 and iron saturation at 20%.  -continue vit B complex 1 tab po  daily to support accelerated hematopoiesis.  4. Recently diagnosed cecal adenocarcinoma - with evidence of local Lnadneopathy 07/01/17 PET revealed Hypermetabolic colon mass in the vicinity of the ileocecal valve, maximum SUV 17.9, compatible with malignancy.  PLAN:  -Discussed pt labwork today, 08/19/17; HGB at 9.5, WBC normal, PLT normal at 365k. -Discussed the patient's decision to cancel surgery and answered questions that she and  her husband had about this -Discussed palliative chemotherapy to address symptoms of tumor. Pt made it clear that she does not want chemotherapy at this time.  -Discussed other option would be supporting blood counts and addressing symptoms as best as possible, followed by hospice referral.  -Will follow up with pt on pending ferritin levels -Will set pt up for monthly IV Injectafer -Will see pt back in 2 months   5) . Patient Active Problem List   Diagnosis Date Noted  . HCAP (healthcare-associated pneumonia) 10/31/2015  . Iron deficiency anemia 10/20/2015  . B12 deficiency anemia 10/20/2015  . Symptomatic anemia 10/19/2015  . UTI (urinary tract infection) 07/26/2014  . TGA (transient global amnesia) 07/25/2014  . Cardiac dysrhythmia 02/06/2010  . PALPITATIONS 02/06/2010  . CHICKENPOX, HX OF 09/07/2008  . Essential hypertension, benign 05/22/2008  . PSVT 03/27/2008  . OBESITY, UNSPECIFIED 03/26/2008  . Abnormal involuntary movement 03/26/2008  . AMNESIA, TRANSIENT GLOBAL 01/10/2007  -advised continued f/u with PCP for management of her other medical co-morbids   Please schedule IV injectafer q4weeks x 4 doses  Labs q4weeks RTC with Dr Irene Limbo in 8 weeks    All of the patients questions were answered with apparent satisfaction. The patient knows to call the clinic with any problems, questions or concerns.  The total time spent in the appt was 35 minutes and more than 50% was on counseling and direct patient cares.    Sullivan Lone MD [place] AAHIVMS [facility] Hematology/Oncology Physician [facility]  (Office):       [phone] (Work cell):  [phone] (Fax):           [phone]  I, Baldwin [place], am acting as a scribe for Dr Irene Limbo.   .I have reviewed the above documentation for accuracy and completeness, and I agree with the above. Brunetta Genera MD

## 2017-08-19 ENCOUNTER — Telehealth: Payer: Self-pay

## 2017-08-19 ENCOUNTER — Encounter: Payer: Self-pay | Admitting: Hematology

## 2017-08-19 ENCOUNTER — Inpatient Hospital Stay (HOSPITAL_BASED_OUTPATIENT_CLINIC_OR_DEPARTMENT_OTHER): Payer: MEDICARE | Admitting: Hematology

## 2017-08-19 ENCOUNTER — Inpatient Hospital Stay: Payer: MEDICARE | Attending: Hematology

## 2017-08-19 VITALS — BP 135/56 | HR 51 | Temp 97.9°F | Resp 18 | Ht 62.0 in | Wt 156.3 lb

## 2017-08-19 DIAGNOSIS — E669 Obesity, unspecified: Secondary | ICD-10-CM | POA: Diagnosis not present

## 2017-08-19 DIAGNOSIS — R251 Tremor, unspecified: Secondary | ICD-10-CM | POA: Diagnosis not present

## 2017-08-19 DIAGNOSIS — I10 Essential (primary) hypertension: Secondary | ICD-10-CM | POA: Diagnosis not present

## 2017-08-19 DIAGNOSIS — K449 Diaphragmatic hernia without obstruction or gangrene: Secondary | ICD-10-CM | POA: Diagnosis not present

## 2017-08-19 DIAGNOSIS — K921 Melena: Secondary | ICD-10-CM

## 2017-08-19 DIAGNOSIS — Z801 Family history of malignant neoplasm of trachea, bronchus and lung: Secondary | ICD-10-CM | POA: Diagnosis not present

## 2017-08-19 DIAGNOSIS — Z9189 Other specified personal risk factors, not elsewhere classified: Secondary | ICD-10-CM

## 2017-08-19 DIAGNOSIS — C180 Malignant neoplasm of cecum: Secondary | ICD-10-CM | POA: Insufficient documentation

## 2017-08-19 DIAGNOSIS — E538 Deficiency of other specified B group vitamins: Secondary | ICD-10-CM

## 2017-08-19 DIAGNOSIS — Z79899 Other long term (current) drug therapy: Secondary | ICD-10-CM

## 2017-08-19 DIAGNOSIS — C189 Malignant neoplasm of colon, unspecified: Secondary | ICD-10-CM | POA: Diagnosis present

## 2017-08-19 DIAGNOSIS — R000 Tachycardia, unspecified: Secondary | ICD-10-CM | POA: Insufficient documentation

## 2017-08-19 DIAGNOSIS — G454 Transient global amnesia: Secondary | ICD-10-CM | POA: Diagnosis not present

## 2017-08-19 DIAGNOSIS — Z7189 Other specified counseling: Secondary | ICD-10-CM

## 2017-08-19 DIAGNOSIS — D500 Iron deficiency anemia secondary to blood loss (chronic): Secondary | ICD-10-CM

## 2017-08-19 LAB — CMP (CANCER CENTER ONLY)
ALT: 11 U/L (ref 0–44)
AST: 13 U/L — ABNORMAL LOW (ref 15–41)
Albumin: 3.5 g/dL (ref 3.5–5.0)
Alkaline Phosphatase: 161 U/L — ABNORMAL HIGH (ref 38–126)
Anion gap: 6 (ref 5–15)
BUN: 11 mg/dL (ref 8–23)
CO2: 25 mmol/L (ref 22–32)
Calcium: 8.6 mg/dL — ABNORMAL LOW (ref 8.9–10.3)
Chloride: 107 mmol/L (ref 98–111)
Creatinine: 0.71 mg/dL (ref 0.44–1.00)
GFR, Est AFR Am: >60 mL/min (ref >60)
GFR, Estimated: >60 mL/min (ref >60)
Glucose, Bld: 98 mg/dL (ref 70–99)
Potassium: 4.3 mmol/L (ref 3.5–5.1)
Sodium: 138 mmol/L (ref 135–145)
Total Bilirubin: <0.2 mg/dL — ABNORMAL LOW (ref 0.3–1.2)
Total Protein: 6.5 g/dL (ref 6.5–8.1)

## 2017-08-19 LAB — CBC WITH DIFFERENTIAL (CANCER CENTER ONLY)
BASOS PCT: 0 %
Basophils Absolute: 0 10*3/uL (ref 0.0–0.1)
Eosinophils Absolute: 0.2 10*3/uL (ref 0.0–0.5)
Eosinophils Relative: 4 %
HEMATOCRIT: 30.9 % — AB (ref 34.8–46.6)
Hemoglobin: 9.5 g/dL — ABNORMAL LOW (ref 11.6–15.9)
LYMPHS PCT: 19 %
Lymphs Abs: 1.1 10*3/uL (ref 0.9–3.3)
MCH: 29.3 pg (ref 25.1–34.0)
MCHC: 30.7 g/dL — AB (ref 31.5–36.0)
MCV: 95.4 fL (ref 79.5–101.0)
MONO ABS: 0.7 10*3/uL (ref 0.1–0.9)
MONOS PCT: 11 %
NEUTROS ABS: 3.8 10*3/uL (ref 1.5–6.5)
Neutrophils Relative %: 66 %
Platelet Count: 365 10*3/uL (ref 145–400)
RBC: 3.24 MIL/uL — ABNORMAL LOW (ref 3.70–5.45)
RDW: 16.1 % — AB (ref 11.2–14.5)
WBC Count: 5.9 10*3/uL (ref 3.9–10.3)

## 2017-08-19 LAB — RETICULOCYTES
RBC.: 3.24 MIL/uL — ABNORMAL LOW (ref 3.70–5.45)
Retic Count, Absolute: 119.9 10*3/uL — ABNORMAL HIGH (ref 33.7–90.7)
Retic Ct Pct: 3.7 % — ABNORMAL HIGH (ref 0.7–2.1)

## 2017-08-19 LAB — IRON AND TIBC
Iron: 25 ug/dL — ABNORMAL LOW (ref 41–142)
Saturation Ratios: 8 % — ABNORMAL LOW (ref 21–57)
TIBC: 330 ug/dL (ref 236–444)
UIBC: 305 ug/dL

## 2017-08-19 LAB — FERRITIN: FERRITIN: 140 ng/mL (ref 11–307)

## 2017-08-19 LAB — VITAMIN B12: Vitamin B-12: 375 pg/mL (ref 180–914)

## 2017-08-19 NOTE — Telephone Encounter (Signed)
Printed avs and calender of upcoming appointment. Per 7/10 los.  

## 2017-08-28 ENCOUNTER — Inpatient Hospital Stay: Payer: MEDICARE

## 2017-08-28 VITALS — BP 122/59 | HR 56 | Temp 98.2°F | Resp 18

## 2017-08-28 DIAGNOSIS — D500 Iron deficiency anemia secondary to blood loss (chronic): Secondary | ICD-10-CM

## 2017-08-28 DIAGNOSIS — R251 Tremor, unspecified: Secondary | ICD-10-CM | POA: Diagnosis not present

## 2017-08-28 DIAGNOSIS — R000 Tachycardia, unspecified: Secondary | ICD-10-CM | POA: Diagnosis not present

## 2017-08-28 DIAGNOSIS — E538 Deficiency of other specified B group vitamins: Secondary | ICD-10-CM | POA: Diagnosis not present

## 2017-08-28 DIAGNOSIS — Z9189 Other specified personal risk factors, not elsewhere classified: Secondary | ICD-10-CM

## 2017-08-28 DIAGNOSIS — C180 Malignant neoplasm of cecum: Secondary | ICD-10-CM | POA: Diagnosis not present

## 2017-08-28 DIAGNOSIS — Z79899 Other long term (current) drug therapy: Secondary | ICD-10-CM | POA: Diagnosis not present

## 2017-08-28 LAB — CBC WITH DIFFERENTIAL/PLATELET
BASOS ABS: 0 10*3/uL (ref 0.0–0.1)
BASOS PCT: 1 %
Eosinophils Absolute: 0.2 10*3/uL (ref 0.0–0.5)
Eosinophils Relative: 4 %
HEMATOCRIT: 25.9 % — AB (ref 34.8–46.6)
Hemoglobin: 8.4 g/dL — ABNORMAL LOW (ref 11.6–15.9)
LYMPHS PCT: 20 %
Lymphs Abs: 1.2 10*3/uL (ref 0.9–3.3)
MCH: 28.7 pg (ref 25.1–34.0)
MCHC: 32.3 g/dL (ref 31.5–36.0)
MCV: 88.9 fL (ref 79.5–101.0)
MONO ABS: 0.6 10*3/uL (ref 0.1–0.9)
Monocytes Relative: 9 %
NEUTROS ABS: 4 10*3/uL (ref 1.5–6.5)
Neutrophils Relative %: 66 %
PLATELETS: 405 10*3/uL — AB (ref 145–400)
RBC: 2.91 MIL/uL — AB (ref 3.70–5.45)
RDW: 16.5 % — AB (ref 11.2–14.5)
WBC: 6 10*3/uL (ref 3.9–10.3)

## 2017-08-28 LAB — FERRITIN: FERRITIN: 71 ng/mL (ref 11–307)

## 2017-08-28 MED ORDER — DIPHENHYDRAMINE HCL 25 MG PO TABS
25.0000 mg | ORAL_TABLET | Freq: Once | ORAL | Status: AC
  Administered 2017-08-28: 25 mg via ORAL
  Filled 2017-08-28: qty 1

## 2017-08-28 MED ORDER — ACETAMINOPHEN 325 MG PO TABS
650.0000 mg | ORAL_TABLET | Freq: Once | ORAL | Status: AC
  Administered 2017-08-28: 650 mg via ORAL

## 2017-08-28 MED ORDER — ACETAMINOPHEN 325 MG PO TABS
ORAL_TABLET | ORAL | Status: AC
  Filled 2017-08-28: qty 2

## 2017-08-28 MED ORDER — DIPHENHYDRAMINE HCL 25 MG PO CAPS
ORAL_CAPSULE | ORAL | Status: AC
  Filled 2017-08-28: qty 1

## 2017-08-28 MED ORDER — SODIUM CHLORIDE 0.9 % IV SOLN
750.0000 mg | Freq: Once | INTRAVENOUS | Status: AC
  Administered 2017-08-28: 750 mg via INTRAVENOUS
  Filled 2017-08-28: qty 15

## 2017-08-28 NOTE — Patient Instructions (Signed)
Ferric carboxymaltose injection What is this medicine? FERRIC CARBOXYMALTOSE (ferr-ik car-box-ee-mol-toes) is an iron complex. Iron is used to make healthy red blood cells, which carry oxygen and nutrients throughout the body. This medicine is used to treat anemia in people with chronic kidney disease or people who cannot take iron by mouth. This medicine may be used for other purposes; ask your health care provider or pharmacist if you have questions. COMMON BRAND NAME(S): Injectafer What should I tell my health care provider before I take this medicine? They need to know if you have any of these conditions: -anemia not caused by low iron levels -high levels of iron in the blood -liver disease -an unusual or allergic reaction to iron, other medicines, foods, dyes, or preservatives -pregnant or trying to get pregnant -breast-feeding How should I use this medicine? This medicine is for infusion into a vein. It is given by a health care professional in a hospital or clinic setting. Talk to your pediatrician regarding the use of this medicine in children. Special care may be needed. Overdosage: If you think you have taken too much of this medicine contact a poison control center or emergency room at once. NOTE: This medicine is only for you. Do not share this medicine with others. What if I miss a dose? It is important not to miss your dose. Call your doctor or health care professional if you are unable to keep an appointment. What may interact with this medicine? Do not take this medicine with any of the following medications: -deferoxamine -dimercaprol -other iron products This medicine may also interact with the following medications: -chloramphenicol -deferasirox This list may not describe all possible interactions. Give your health care provider a list of all the medicines, herbs, non-prescription drugs, or dietary supplements you use. Also tell them if you smoke, drink alcohol, or use  illegal drugs. Some items may interact with your medicine. What should I watch for while using this medicine? Visit your doctor or health care professional regularly. Tell your doctor if your symptoms do not start to get better or if they get worse. You may need blood work done while you are taking this medicine. You may need to follow a special diet. Talk to your doctor. Foods that contain iron include: whole grains/cereals, dried fruits, beans, or peas, leafy green vegetables, and organ meats (liver, kidney). What side effects may I notice from receiving this medicine? Side effects that you should report to your doctor or health care professional as soon as possible: -allergic reactions like skin rash, itching or hives, swelling of the face, lips, or tongue -breathing problems -changes in blood pressure -feeling faint or lightheaded, falls -flushing, sweating, or hot feelings Side effects that usually do not require medical attention (report to your doctor or health care professional if they continue or are bothersome): -changes in taste -constipation -dizziness -headache -nausea -pain, redness, or irritation at site where injected -vomiting This list may not describe all possible side effects. Call your doctor for medical advice about side effects. You may report side effects to FDA at 1-800-FDA-1088. Where should I keep my medicine? This drug is given in a hospital or clinic and will not be stored at home. NOTE: This sheet is a summary. It may not cover all possible information. If you have questions about this medicine, talk to your doctor, pharmacist, or health care provider.  2018 Elsevier/Gold Standard (2015-03-01 11:20:47)  

## 2017-09-04 NOTE — Progress Notes (Signed)
HEMATOLOGY/ONCOLOGY CLINIC NOTE  Date of Service:  09/07/17     Patient Care Team: Lawerance Cruel, MD as PCP - General  CHIEF COMPLAINTS/PURPOSE OF CONSULTATION:  Iron deficiency Anemia and B12 deficiency Newly diagnosed colon cancer  HISTORY OF PRESENTING ILLNESS:   [name] [name] is a wonderful 84 y.o. female who has been referred to [place] by Dr .Harrington Challenger, Dwyane Luo, MD for evaluation and management of microcytic anemia.  Patient has a h/o HTN, atrial tachycardia, , benign tremor who was noted to have a new severe microcytic anemia and weakness with a drop of hgb down to 7.4 in sept 2017. She was admitted to the hospital and received 1 unit of PRBC. She was noted to have severe new iron deficiency and also noted to have B12 deficiency. Her stool occult blood was apparently neg. Was discharged on PO iron and was taking it for a few months. Rpt stool studies with PCP were again noted to be hemoccult neg. She received B12 IM x 1 in the hospital but has not been on any B12 since then. Patient had refused a GI workup in the hospital and was to be seen by Dr Fuller Plan for GI workup in the outpatient setting but has refused and continues to refuse and GI workup.  Denies any overt GI bleeding. No melena no hematemesis no hematochezia. No nausea/vomiting or abdominal pain. No significant weight loss recently.  Her hgb improved some on Po iron but then started dropping again and so she was referred to [place] for consideration of IV iron replacement.  On CXR in the hospital she was incidentally noted to have a moderate to large hiatal hernia.   INTERVAL HISTORY   [name] [name] is here for fu of her newly diagnosed colon cancer. The patient's last visit with [place] was on 08/19/17. She is accompanied today by her daughters. The pt reports that she is doing well overall.   The pt reports that she has been referred to see Dr. Lucia Gaskins at [facility] after discussing her case with GI Dr. Richmond Campbell. She notes that she is now considering surgery again, and will be discussing this with Dr. Lucia Gaskins soon. She notes that she has been eating well, and hasn't had any pain whatsoever. She notes some occasional red blood in her stools that she associates with her hemorrhoids, and denies and dark red or black stools.   Lab results today (09/07/17) of CBC w/diff, CMP, and Reticulocytes is as follows: all values are WNL except for RBC at 3.10, HGB at 8.8, HCT at 29.0, MCHC at 30.3, RDW at 17.4, PLT at 455k, Glucose at 148, Calcium at 8.5, Albumin at 3.4, AST at 12, Alk Phos at 155, Total Bilirubin at <0.2, Retic ct pct at 6.9%, Retic ct abs at 213.9. Iron/TIBC 09/07/17 shows all values WNL except for Iron at 26 and Saturation ratio at 7%. Ferritin 09/07/17 at 340  On review of systems, pt reports red blood in the stools, stable energy levels, stable and mild leg swelling, and denies abdominal pains, black stools, light headedness, dizziness, urinary discomfort, and any other symptoms.    MEDICAL HISTORY:  Past Medical History:  Diagnosis Date  . Atrial tachycardia ([place])   . HTN (hypertension)   . Jaundice due to hepatitis    at age 33 or 5  . Transient global amnesia    AMS  . Tremors of nervous system    benign familial  Large Hiatal  hernia HCAP 10/2015 Iron deficiency Anemia B12 deficiency.  SURGICAL HISTORY: Past Surgical History:  Procedure Laterality Date  . ABDOMINAL HERNIA REPAIR    . BACK SURGERY     years ago  . TONSILLECTOMY      SOCIAL HISTORY: Social History   Socioeconomic History  . Marital status: Married    Spouse name: Not on file  . Number of children: Not on file  . Years of education: Not on file  . Highest education level: Not on file  Occupational History  . Not on file  Social Needs  . Financial resource strain: Not on file  . Food insecurity:    Worry: Not on file    Inability: Not on file  . Transportation needs:    Medical: Not on file     Non-medical: Not on file  Tobacco Use  . Smoking status: Never Smoker  . Smokeless tobacco: Never Used  . Tobacco comment: + prior 2nd hand exposure from spouse smoking  Substance and Sexual Activity  . Alcohol use: No    Alcohol/week: 0.0 oz  . Drug use: No  . Sexual activity: Never  Lifestyle  . Physical activity:    Days per week: Not on file    Minutes per session: Not on file  . Stress: Not on file  Relationships  . Social connections:    Talks on phone: Not on file    Gets together: Not on file    Attends religious service: Not on file    Active member of club or organization: Not on file    Attends meetings of clubs or organizations: Not on file    Relationship status: Not on file  . Intimate partner violence:    Fear of current or ex partner: Not on file    Emotionally abused: Not on file    Physically abused: Not on file    Forced sexual activity: Not on file  Other Topics Concern  . Not on file  Social History Narrative  . Not on file    FAMILY HISTORY: Family History  Problem Relation Age of Onset  . Lung cancer Mother   . Parkinson's disease Mother   . Hypertension Mother   . Other Father        blood clot  . Arthritis Sister   . Other Sister        spinal stenosis    ALLERGIES:  is allergic to aspirin; codeine; hydrochlorothiazide; penicillins; sulfa antibiotics; sulfonamide derivatives; biotin; levofloxacin; macrodantin [nitrofurantoin]; and latex.  MEDICATIONS:  Current Outpatient Medications  Medication Sig Dispense Refill  . amLODipine (NORVASC) 10 MG tablet TAKE 1 TABLET DAILY 90 tablet 3  . cloNIDine (CATAPRES) 0.2 MG tablet TAKE 1 TABLET EVERY MORNINGAND 1 AND 1/2 TABLETS AT   BEDTIME 225 tablet 3  . lubiprostone (AMITIZA) 8 MCG capsule Take 24 mcg by mouth 2 (two) times daily with a meal.    . magnesium citrate SOLN Take 1 Bottle by mouth as needed.     . metoprolol tartrate (LOPRESSOR) 25 MG tablet TAKE 1/2 TABLET TWICE A DAY 90 tablet 0     No current facility-administered medications for this visit.     REVIEW OF SYSTEMS:    A 10+ POINT REVIEW OF SYSTEMS WAS OBTAINED including neurology, dermatology, psychiatry, cardiac, respiratory, lymph, extremities, GI, GU, Musculoskeletal, constitutional, breasts, reproductive, HEENT.  All pertinent positives are noted in the HPI.  All others are negative.   PHYSICAL EXAMINATION:  ECOG PERFORMANCE  STATUS: 2 - Symptomatic, <50% confined to bed  . Vitals:   09/07/17 1544  BP: (!) 133/58  Pulse: 62  Resp: 18  Temp: 98.3 F (36.8 C)  SpO2: 100%   Filed Weights   09/07/17 1544  Weight: 154 lb 11.2 oz (70.2 kg)   .Body mass index is 28.3 kg/m.  GENERAL:alert, in no acute distress and comfortable SKIN: no acute rashes, no significant lesions EYES: conjunctiva are pink and non-injected, sclera anicteric OROPHARYNX: MMM, no exudates, no oropharyngeal erythema or ulceration NECK: supple, no JVD LYMPH:  no palpable lymphadenopathy in the cervical, axillary or inguinal regions LUNGS: clear to auscultation b/l with normal respiratory effort HEART: regular rate & rhythm ABDOMEN:  normoactive bowel sounds , non tender, not distended. No palpable hepatosplenomegaly.  Extremity: 1+ pedal edema PSYCH: alert & oriented x 3 with fluent speech NEURO: no focal motor/sensory deficits   LABORATORY DATA:  I have reviewed the data as listed  . CBC Latest Ref Rng & Units 09/07/2017 08/28/2017 08/19/2017  WBC 3.9 - 10.3 K/uL 6.7 6.0 5.9  Hemoglobin 11.6 - 15.9 g/dL 8.8(L) 8.4(L) 9.5(L)  Hematocrit 34.8 - 46.6 % 29.0(L) 25.9(L) 30.9(L)  Platelets 145 - 400 K/uL 455(H) 405(H) 365    . CMP Latest Ref Rng & Units 09/07/2017 08/19/2017 06/22/2017  Glucose 70 - 99 mg/dL 148(H) 98 138  BUN 8 - 23 mg/dL _0 Creatinine 0.44 - 1.00 mg/dL 0.69 0.71 0.78  Sodium 135 - 145 mmol/L 139 138 139  Potassium 3.5 - 5.1 mmol/L 3.9 4.3 3.8  Chloride 98 - 111 mmol/L 108 107 108  CO2 22 - 32 mmol/L _1 Calcium 8.9 - 10.3 mg/dL 8.5(L) 8.6(L) 8.7  Total Protein 6.5 - 8.1 g/dL 6.7 6.5 6.9  Total Bilirubin 0.3 - 1.2 mg/dL <0.2(L) <0.2(L) <0.2(L)  Alkaline Phos 38 - 126 U/L 155(H) 161(H) 174(H)  AST 15 - 41 U/L 12(L) 13(L) 12  ALT 0 - 44 U/L _2 . Lab Results  Component Value Date   IRON 26 (L) 09/07/2017   TIBC 351 09/07/2017   IRONPCTSAT 7 (L) 09/07/2017   (Iron and TIBC)  Lab Results  Component Value Date   FERRITIN 340 (H) 09/07/2017    Component     Latest Ref Rng & Units 05/08/2016  Parietal Cell Ab     0.0 - 20.0 Units 3.5  Intrinsic Factor Abs, Serum     0.0 - 1.1 AU/mL 0.9      RADIOGRAPHIC STUDIES: I have personally reviewed the radiological images as listed and agreed with the findings in the report. No results found.  ASSESSMENT & PLAN:   84 yo caucasian female with   1) Severe Microcytic Anemia s/p PRBc transfusion in sept 2017.  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 Lab Results  Component Value Date   IRON 26 (L) 09/07/2017   TIBC 351 09/07/2017   IRONPCTSAT 7 (L) 09/07/2017   (Iron and TIBC)  Lab Results  Component Value Date   FERRITIN 340 (H) 09/07/2017   3) B12 deficiency - antiparietal cell and anti IF ab neg. B12 levels adequate with current replacement. B12 --- 375 PLAN -I discussed goal is to maintain Ferritin > 100 and iron saturation at 20%.  -continue vit B complex 1 tab po daily to support accelerated hematopoiesis.  4. Recently diagnosed  cecal adenocarcinoma - with evidence of local Lnadneopathy 07/01/17 PET revealed Hypermetabolic colon mass in the vicinity of the ileocecal valve, maximum SUV 17.9, compatible with malignancy.  -Discussed pt labwork today, 09/07/17; HGB at 8.8, PLT at 455k, 7% saturation ratio, ferritin at 340.  -Worsening anemia and increased platelets most likely reactive to tumor as iron  deficiency is no longer apparent -Will see pt back in 2 months unless any new or concerning symptoms  -Continue monthly IV Injectafer to support ongoing GI bleeding -f/u with Dr Jennell Corner as per appointment to gegt 2nd opinion about surgical options.- though she is not too keen to ungo surgery.  5) . Patient Active Problem List   Diagnosis Date Noted  . HCAP (healthcare-associated pneumonia) 10/31/2015  . Iron deficiency anemia 10/20/2015  . B12 deficiency anemia 10/20/2015  . Symptomatic anemia 10/19/2015  . UTI (urinary tract infection) 07/26/2014  . TGA (transient global amnesia) 07/25/2014  . Cardiac dysrhythmia 02/06/2010  . PALPITATIONS 02/06/2010  . CHICKENPOX, HX OF 09/07/2008  . Essential hypertension, benign 05/22/2008  . PSVT 03/27/2008  . OBESITY, UNSPECIFIED 03/26/2008  . Abnormal involuntary movement 03/26/2008  . AMNESIA, TRANSIENT GLOBAL 01/10/2007  -advised continued f/u with PCP for management of her other medical co-morbids   F/u for labs and IV Iron infusions as scheduled RTC with Dr Irene Limbo in 2 months with labs   All of the patients questions were answered with apparent satisfaction. The patient knows to call the clinic with any problems, questions or concerns.  The total time spent in the appt was 40 minutes and more than 50% was on counseling and direct patient cares.   Sullivan Lone MD [place] AAHIVMS [facility] Hematology/Oncology Physician [facility]  (Office):       [phone] (Work cell):  [phone] (Fax):           [phone]  I, Baldwin Jamaica, am acting as a scribe for Dr Irene Limbo.   .I have reviewed the above documentation for accuracy and completeness, and I agree with the above. Brunetta Genera MD

## 2017-09-07 ENCOUNTER — Inpatient Hospital Stay (HOSPITAL_BASED_OUTPATIENT_CLINIC_OR_DEPARTMENT_OTHER): Payer: MEDICARE | Admitting: Hematology

## 2017-09-07 ENCOUNTER — Telehealth: Payer: Self-pay

## 2017-09-07 ENCOUNTER — Inpatient Hospital Stay: Payer: MEDICARE

## 2017-09-07 VITALS — BP 133/58 | HR 62 | Temp 98.3°F | Resp 18 | Ht 62.0 in | Wt 154.7 lb

## 2017-09-07 DIAGNOSIS — K921 Melena: Secondary | ICD-10-CM | POA: Diagnosis not present

## 2017-09-07 DIAGNOSIS — E669 Obesity, unspecified: Secondary | ICD-10-CM | POA: Diagnosis not present

## 2017-09-07 DIAGNOSIS — D509 Iron deficiency anemia, unspecified: Secondary | ICD-10-CM

## 2017-09-07 DIAGNOSIS — G454 Transient global amnesia: Secondary | ICD-10-CM

## 2017-09-07 DIAGNOSIS — R000 Tachycardia, unspecified: Secondary | ICD-10-CM | POA: Diagnosis not present

## 2017-09-07 DIAGNOSIS — Z79899 Other long term (current) drug therapy: Secondary | ICD-10-CM

## 2017-09-07 DIAGNOSIS — K449 Diaphragmatic hernia without obstruction or gangrene: Secondary | ICD-10-CM

## 2017-09-07 DIAGNOSIS — C180 Malignant neoplasm of cecum: Secondary | ICD-10-CM | POA: Diagnosis not present

## 2017-09-07 DIAGNOSIS — D500 Iron deficiency anemia secondary to blood loss (chronic): Secondary | ICD-10-CM

## 2017-09-07 DIAGNOSIS — E538 Deficiency of other specified B group vitamins: Secondary | ICD-10-CM | POA: Diagnosis not present

## 2017-09-07 DIAGNOSIS — Z9189 Other specified personal risk factors, not elsewhere classified: Secondary | ICD-10-CM

## 2017-09-07 DIAGNOSIS — I10 Essential (primary) hypertension: Secondary | ICD-10-CM

## 2017-09-07 DIAGNOSIS — R251 Tremor, unspecified: Secondary | ICD-10-CM

## 2017-09-07 DIAGNOSIS — Z801 Family history of malignant neoplasm of trachea, bronchus and lung: Secondary | ICD-10-CM

## 2017-09-07 LAB — RETICULOCYTES
RBC.: 3.1 MIL/uL — ABNORMAL LOW (ref 3.70–5.45)
RETIC COUNT ABSOLUTE: 213.9 10*3/uL — AB (ref 33.7–90.7)
Retic Ct Pct: 6.9 % — ABNORMAL HIGH (ref 0.7–2.1)

## 2017-09-07 LAB — CBC WITH DIFFERENTIAL/PLATELET
BASOS ABS: 0 10*3/uL (ref 0.0–0.1)
Basophils Relative: 1 %
EOS PCT: 5 %
Eosinophils Absolute: 0.3 10*3/uL (ref 0.0–0.5)
HCT: 29 % — ABNORMAL LOW (ref 34.8–46.6)
Hemoglobin: 8.8 g/dL — ABNORMAL LOW (ref 11.6–15.9)
LYMPHS PCT: 23 %
Lymphs Abs: 1.5 10*3/uL (ref 0.9–3.3)
MCH: 28.4 pg (ref 25.1–34.0)
MCHC: 30.3 g/dL — ABNORMAL LOW (ref 31.5–36.0)
MCV: 93.5 fL (ref 79.5–101.0)
Monocytes Absolute: 0.5 10*3/uL (ref 0.1–0.9)
Monocytes Relative: 7 %
Neutro Abs: 4.3 10*3/uL (ref 1.5–6.5)
Neutrophils Relative %: 64 %
Platelets: 455 10*3/uL — ABNORMAL HIGH (ref 145–400)
RBC: 3.1 MIL/uL — AB (ref 3.70–5.45)
RDW: 17.4 % — ABNORMAL HIGH (ref 11.2–14.5)
WBC: 6.7 10*3/uL (ref 3.9–10.3)

## 2017-09-07 LAB — CMP (CANCER CENTER ONLY)
ALBUMIN: 3.4 g/dL — AB (ref 3.5–5.0)
ALK PHOS: 155 U/L — AB (ref 38–126)
ALT: 6 U/L (ref 0–44)
AST: 12 U/L — AB (ref 15–41)
Anion gap: 7 (ref 5–15)
BUN: 13 mg/dL (ref 8–23)
CO2: 24 mmol/L (ref 22–32)
Calcium: 8.5 mg/dL — ABNORMAL LOW (ref 8.9–10.3)
Chloride: 108 mmol/L (ref 98–111)
Creatinine: 0.69 mg/dL (ref 0.44–1.00)
GFR, Est AFR Am: >60 mL/min (ref >60)
GLUCOSE: 148 mg/dL — AB (ref 70–99)
POTASSIUM: 3.9 mmol/L (ref 3.5–5.1)
Sodium: 139 mmol/L (ref 135–145)
TOTAL PROTEIN: 6.7 g/dL (ref 6.5–8.1)

## 2017-09-07 LAB — IRON AND TIBC
Iron: 26 ug/dL — ABNORMAL LOW (ref 41–142)
SATURATION RATIOS: 7 % — AB (ref 21–57)
TIBC: 351 ug/dL (ref 236–444)
UIBC: 325 ug/dL

## 2017-09-07 LAB — FERRITIN: FERRITIN: 340 ng/mL — AB (ref 11–307)

## 2017-09-07 NOTE — Telephone Encounter (Signed)
Per 7/29 no los

## 2017-09-25 ENCOUNTER — Inpatient Hospital Stay: Payer: MEDICARE | Attending: Hematology

## 2017-09-25 ENCOUNTER — Other Ambulatory Visit: Payer: Self-pay

## 2017-09-25 ENCOUNTER — Telehealth: Payer: Self-pay | Admitting: Hematology

## 2017-09-25 ENCOUNTER — Inpatient Hospital Stay: Payer: MEDICARE

## 2017-09-25 VITALS — BP 115/78 | HR 54 | Temp 98.3°F | Resp 18

## 2017-09-25 DIAGNOSIS — D509 Iron deficiency anemia, unspecified: Secondary | ICD-10-CM

## 2017-09-25 DIAGNOSIS — Z79899 Other long term (current) drug therapy: Secondary | ICD-10-CM | POA: Diagnosis not present

## 2017-09-25 DIAGNOSIS — D649 Anemia, unspecified: Secondary | ICD-10-CM

## 2017-09-25 DIAGNOSIS — Z9189 Other specified personal risk factors, not elsewhere classified: Secondary | ICD-10-CM

## 2017-09-25 DIAGNOSIS — D500 Iron deficiency anemia secondary to blood loss (chronic): Secondary | ICD-10-CM

## 2017-09-25 DIAGNOSIS — C180 Malignant neoplasm of cecum: Secondary | ICD-10-CM | POA: Insufficient documentation

## 2017-09-25 LAB — RETICULOCYTES
RBC.: 3.01 MIL/uL — AB (ref 3.70–5.45)
RETIC COUNT ABSOLUTE: 90.3 10*3/uL (ref 33.7–90.7)
RETIC CT PCT: 3 % — AB (ref 0.7–2.1)

## 2017-09-25 LAB — CBC WITH DIFFERENTIAL/PLATELET
BASOS ABS: 0 10*3/uL (ref 0.0–0.1)
BASOS PCT: 0 %
EOS ABS: 0.2 10*3/uL (ref 0.0–0.5)
Eosinophils Relative: 2 %
HCT: 26.3 % — ABNORMAL LOW (ref 34.8–46.6)
HEMOGLOBIN: 7.7 g/dL — AB (ref 11.6–15.9)
Lymphocytes Relative: 16 %
Lymphs Abs: 1.1 10*3/uL (ref 0.9–3.3)
MCH: 25.6 pg (ref 25.1–34.0)
MCHC: 29.3 g/dL — ABNORMAL LOW (ref 31.5–36.0)
MCV: 87.4 fL (ref 79.5–101.0)
Monocytes Absolute: 0.7 10*3/uL (ref 0.1–0.9)
Monocytes Relative: 10 %
NEUTROS PCT: 72 %
Neutro Abs: 5.1 10*3/uL (ref 1.5–6.5)
Platelets: 431 10*3/uL — ABNORMAL HIGH (ref 145–400)
RBC: 3.01 MIL/uL — ABNORMAL LOW (ref 3.70–5.45)
RDW: 15.7 % — AB (ref 11.2–14.5)
WBC: 7.1 10*3/uL (ref 3.9–10.3)

## 2017-09-25 LAB — ABO/RH: ABO/RH(D): O POS

## 2017-09-25 LAB — PREPARE RBC (CROSSMATCH)

## 2017-09-25 LAB — FERRITIN: FERRITIN: 61 ng/mL (ref 11–307)

## 2017-09-25 MED ORDER — SODIUM CHLORIDE 0.9 % IV SOLN
750.0000 mg | Freq: Once | INTRAVENOUS | Status: AC
  Administered 2017-09-25: 750 mg via INTRAVENOUS
  Filled 2017-09-25: qty 15

## 2017-09-25 MED ORDER — DIPHENHYDRAMINE HCL 25 MG PO CAPS
ORAL_CAPSULE | ORAL | Status: AC
  Filled 2017-09-25: qty 1

## 2017-09-25 MED ORDER — ACETAMINOPHEN 325 MG PO TABS
650.0000 mg | ORAL_TABLET | Freq: Once | ORAL | Status: AC
  Administered 2017-09-25: 650 mg via ORAL

## 2017-09-25 MED ORDER — SODIUM CHLORIDE 0.9% IV SOLUTION
250.0000 mL | Freq: Once | INTRAVENOUS | Status: AC
  Administered 2017-09-25: 250 mL via INTRAVENOUS
  Filled 2017-09-25: qty 250

## 2017-09-25 MED ORDER — ACETAMINOPHEN 325 MG PO TABS
ORAL_TABLET | ORAL | Status: AC
Start: 2017-09-25 — End: ?
  Filled 2017-09-25: qty 2

## 2017-09-25 MED ORDER — SODIUM CHLORIDE 0.9 % IV SOLN
Freq: Once | INTRAVENOUS | Status: AC
  Administered 2017-09-25: 14:00:00 via INTRAVENOUS
  Filled 2017-09-25: qty 250

## 2017-09-25 MED ORDER — SODIUM CHLORIDE 0.9% IV SOLUTION
250.0000 mL | Freq: Once | INTRAVENOUS | Status: DC
  Filled 2017-09-25: qty 250

## 2017-09-25 MED ORDER — DIPHENHYDRAMINE HCL 25 MG PO TABS
25.0000 mg | ORAL_TABLET | Freq: Once | ORAL | Status: AC
  Administered 2017-09-25: 25 mg via ORAL
  Filled 2017-09-25: qty 1

## 2017-09-25 NOTE — Patient Instructions (Addendum)
Ferric carboxymaltose injection What is this medicine? FERRIC CARBOXYMALTOSE (ferr-ik car-box-ee-mol-toes) is an iron complex. Iron is used to make healthy red blood cells, which carry oxygen and nutrients throughout the body. This medicine is used to treat anemia in people with chronic kidney disease or people who cannot take iron by mouth. This medicine may be used for other purposes; ask your health care provider or pharmacist if you have questions. COMMON BRAND NAME(S): Injectafer What should I tell my health care provider before I take this medicine? They need to know if you have any of these conditions: -anemia not caused by low iron levels -high levels of iron in the blood -liver disease -an unusual or allergic reaction to iron, other medicines, foods, dyes, or preservatives -pregnant or trying to get pregnant -breast-feeding How should I use this medicine? This medicine is for infusion into a vein. It is given by a health care professional in a hospital or clinic setting. Talk to your pediatrician regarding the use of this medicine in children. Special care may be needed. Overdosage: If you think you have taken too much of this medicine contact a poison control center or emergency room at once. NOTE: This medicine is only for you. Do not share this medicine with others. What if I miss a dose? It is important not to miss your dose. Call your doctor or health care professional if you are unable to keep an appointment. What may interact with this medicine? Do not take this medicine with any of the following medications: -deferoxamine -dimercaprol -other iron products This medicine may also interact with the following medications: -chloramphenicol -deferasirox This list may not describe all possible interactions. Give your health care provider a list of all the medicines, herbs, non-prescription drugs, or dietary supplements you use. Also tell them if you smoke, drink alcohol, or use  illegal drugs. Some items may interact with your medicine. What should I watch for while using this medicine? Visit your doctor or health care professional regularly. Tell your doctor if your symptoms do not start to get better or if they get worse. You may need blood work done while you are taking this medicine. You may need to follow a special diet. Talk to your doctor. Foods that contain iron include: whole grains/cereals, dried fruits, beans, or peas, leafy green vegetables, and organ meats (liver, kidney). What side effects may I notice from receiving this medicine? Side effects that you should report to your doctor or health care professional as soon as possible: -allergic reactions like skin rash, itching or hives, swelling of the face, lips, or tongue -breathing problems -changes in blood pressure -feeling faint or lightheaded, falls -flushing, sweating, or hot feelings Side effects that usually do not require medical attention (report to your doctor or health care professional if they continue or are bothersome): -changes in taste -constipation -dizziness -headache -nausea -pain, redness, or irritation at site where injected -vomiting This list may not describe all possible side effects. Call your doctor for medical advice about side effects. You may report side effects to FDA at 1-800-FDA-1088. Where should I keep my medicine? This drug is given in a hospital or clinic and will not be stored at home. NOTE: This sheet is a summary. It may not cover all possible information. If you have questions about this medicine, talk to your doctor, pharmacist, or health care provider.  2018 Elsevier/Gold Standard (2015-03-01 11:20:47)    Blood Transfusion, Care After This sheet gives you information about how to   care for yourself after your procedure. Your doctor may also give you more specific instructions. If you have problems or questions, contact your doctor. Follow these instructions  at home:  Take over-the-counter and prescription medicines only as told by your doctor.  Go back to your normal activities as told by your doctor.  Follow instructions from your doctor about how to take care of the area where an IV tube was put into your vein (insertion site). Make sure you: ? Wash your hands with soap and water before you change your bandage (dressing). If there is no soap and water, use hand sanitizer. ? Change your bandage as told by your doctor.  Check your IV insertion site every day for signs of infection. Check for: ? More redness, swelling, or pain. ? More fluid or blood. ? Warmth. ? Pus or a bad smell. Contact a doctor if:  You have more redness, swelling, or pain around the IV insertion site..  You have more fluid or blood coming from the IV insertion site.  Your IV insertion site feels warm to the touch.  You have pus or a bad smell coming from the IV insertion site.  Your pee (urine) turns pink, red, or brown.  You feel weak after doing your normal activities. Get help right away if:  You have signs of a serious allergic or body defense (immune) system reaction, including: ? Itchiness. ? Hives. ? Trouble breathing. ? Anxiety. ? Pain in your chest or lower back. ? Fever, flushing, and chills. ? Fast pulse. ? Rash. ? Watery poop (diarrhea). ? Throwing up (vomiting). ? Dark pee. ? Serious headache. ? Dizziness. ? Stiff neck. ? Yellow color in your face or the white parts of your eyes (jaundice). Summary  After a blood transfusion, return to your normal activities as told by your doctor.  Every day, check for signs of infection where the IV tube was put into your vein.  Some signs of infection are warm skin, more redness and pain, more fluid or blood, and pus or a bad smell where the needle went in.  Contact your doctor if you feel weak or have any unusual symptoms. This information is not intended to replace advice given to you by your  health care provider. Make sure you discuss any questions you have with your health care provider. Document Released: 02/17/2014 Document Revised: 09/21/2015 Document Reviewed: 09/21/2015 Elsevier Interactive Patient Education  2017 Elsevier Inc.   

## 2017-09-25 NOTE — Telephone Encounter (Signed)
Per 8/16 schedule message patient in inf now, add one unit for blood tomorrow and call charge nurse with appointment. Spoke with Diane in infusion and added blood for 9 am per diane. Diane will given appointment to charge.

## 2017-09-26 ENCOUNTER — Inpatient Hospital Stay: Payer: MEDICARE

## 2017-09-26 ENCOUNTER — Other Ambulatory Visit: Payer: Self-pay | Admitting: Oncology

## 2017-09-26 DIAGNOSIS — C180 Malignant neoplasm of cecum: Secondary | ICD-10-CM | POA: Diagnosis not present

## 2017-09-26 DIAGNOSIS — D649 Anemia, unspecified: Secondary | ICD-10-CM

## 2017-09-26 DIAGNOSIS — Z79899 Other long term (current) drug therapy: Secondary | ICD-10-CM | POA: Diagnosis not present

## 2017-09-26 MED ORDER — ACETAMINOPHEN 325 MG PO TABS
ORAL_TABLET | ORAL | Status: AC
  Filled 2017-09-26: qty 2

## 2017-09-26 MED ORDER — DIPHENHYDRAMINE HCL 25 MG PO CAPS
ORAL_CAPSULE | ORAL | Status: AC
  Filled 2017-09-26: qty 1

## 2017-09-26 MED ORDER — SODIUM CHLORIDE 0.9% IV SOLUTION
250.0000 mL | Freq: Once | INTRAVENOUS | Status: DC
  Filled 2017-09-26: qty 250

## 2017-09-26 MED ORDER — ACETAMINOPHEN 325 MG PO TABS
650.0000 mg | ORAL_TABLET | Freq: Once | ORAL | Status: AC
  Administered 2017-09-26: 650 mg via ORAL

## 2017-09-26 MED ORDER — SODIUM CHLORIDE 0.9% IV SOLUTION
250.0000 mL | Freq: Once | INTRAVENOUS | Status: AC
  Administered 2017-09-26: 250 mL via INTRAVENOUS
  Filled 2017-09-26: qty 250

## 2017-09-26 MED ORDER — DIPHENHYDRAMINE HCL 25 MG PO CAPS
25.0000 mg | ORAL_CAPSULE | Freq: Once | ORAL | Status: AC
  Administered 2017-09-26: 25 mg via ORAL

## 2017-09-28 LAB — BPAM RBC
BLOOD PRODUCT EXPIRATION DATE: 201909152359
BLOOD PRODUCT EXPIRATION DATE: 201909152359
ISSUE DATE / TIME: 201908171024
ISSUE DATE / TIME: 201908161531
UNIT TYPE AND RH: 5100
Unit Type and Rh: 5100

## 2017-09-28 LAB — TYPE AND SCREEN
ABO/RH(D): O POS
Antibody Screen: NEGATIVE
Unit division: 0
Unit division: 0

## 2017-09-30 NOTE — Patient Instructions (Signed)

## 2017-09-30 NOTE — Progress Notes (Signed)
Late Entry for 09-26-2017. Reviewed blood transfusion after care instructions with patient and daughter before daughter assisted patient via w/c to restroom upon IV D/C.  Confirms thermometer for home use.  Stated she will report to ED for fever greater than or equal to 100.5 with chills.  "I'd like to keep the blue blood bracelet, appointment visit bracelet and summary.  I keep a record so I know every time I've been here or in the hospital."  Denied further questions or needs.

## 2017-10-01 ENCOUNTER — Encounter: Payer: Self-pay | Admitting: *Deleted

## 2017-10-06 ENCOUNTER — Ambulatory Visit: Payer: Self-pay | Admitting: General Surgery

## 2017-10-06 DIAGNOSIS — C182 Malignant neoplasm of ascending colon: Secondary | ICD-10-CM | POA: Diagnosis not present

## 2017-10-06 MED ORDER — GENTAMICIN SULFATE 40 MG/ML IJ SOLN: 5.0000 mg/kg | INTRAVENOUS | Status: DC

## 2017-10-06 MED ORDER — DEXTROSE 5 % IV SOLN: 900.0000 mg | INTRAVENOUS | Status: DC

## 2017-10-09 ENCOUNTER — Telehealth: Payer: Self-pay

## 2017-10-09 NOTE — Telephone Encounter (Signed)
   [facility] Pre-operative Risk Assessment    Request for surgical clearance:  1. What type of surgery is being performed? Colorectal Surgery   2. When is this surgery scheduled?  TBD   3. What type of clearance is required (medical clearance vs. Pharmacy clearance to hold med vs. Both)? Medical  4. Are there any medications that need to be held prior to surgery and how long? N/A   5. Practice name and name of physician performing surgery? [facility]   6. What is your office phone and fax number? [facility] Fax: [phone]   7. Anesthesia type (None, local, MAC, general) ? General   Meryl Crutch 10/09/2017, 8:02 AM  _________________________________________________________________   (provider comments below)

## 2017-10-13 NOTE — Telephone Encounter (Signed)
   Primary Cardiologist: Minus Breeding, MD  Chart reviewed as part of pre-operative protocol coverage. Patient was contacted 10/13/2017 in reference to pre-operative risk assessment for pending surgery as outlined below.  [name] [name] was last seen on 07/22/17 by Suezanne Cheshire for surgical clearance. She had reassuring echo and "cleared for surgery from cardiac perspective. She is a moderate risk patient due to her age and frailty only". Now she is having surgery with different group.  Since that day, [name] [name] has done well. She has no cardiac complains.   Therefore, based on ACC/AHA guidelines, the patient would be at acceptable risk for the planned procedure without further cardiovascular testing.   I will route this recommendation to the requesting party via Epic fax function and remove from pre-op pool.  Please call with questions.  [place], [place] 10/13/2017, 2:52 PM

## 2017-10-13 NOTE — Progress Notes (Signed)
HEMATOLOGY/ONCOLOGY CLINIC NOTE  Date of Service:  10/14/17     Patient Care Team: Lawerance Cruel, MD as PCP - General Minus Breeding, MD as PCP - Cardiology (Cardiology)  CHIEF COMPLAINTS/PURPOSE OF CONSULTATION:  Iron deficiency Anemia and B12 deficiency Newly diagnosed colon cancer  HISTORY OF PRESENTING ILLNESS:   [name] [name] is a wonderful 84 y.o. female who has been referred to [place] by Dr .Harrington Challenger, Dwyane Luo, MD for evaluation and management of microcytic anemia.  Patient has a h/o HTN, atrial tachycardia, , benign tremor who was noted to have a new severe microcytic anemia and weakness with a drop of hgb down to 7.4 in sept 2017. She was admitted to the hospital and received 1 unit of PRBC. She was noted to have severe new iron deficiency and also noted to have B12 deficiency. Her stool occult blood was apparently neg. Was discharged on PO iron and was taking it for a few months. Rpt stool studies with PCP were again noted to be hemoccult neg. She received B12 IM x 1 in the hospital but has not been on any B12 since then. Patient had refused a GI workup in the hospital and was to be seen by Dr Fuller Plan for GI workup in the outpatient setting but has refused and continues to refuse and GI workup.  Denies any overt GI bleeding. No melena no hematemesis no hematochezia. No nausea/vomiting or abdominal pain. No significant weight loss recently.  Her hgb improved some on Po iron but then started dropping again and so she was referred to [place] for consideration of IV iron replacement.  On CXR in the hospital she was incidentally noted to have a moderate to large hiatal hernia.   INTERVAL HISTORY   [name] [name] is here for fu of her newly diagnosed colon cancer. The patient's last visit with [place] was on 09/07/17. She is accompanied today by her daughter. The pt reports that she is doing well overall.   The pt reports that she has been feeling more tired by mid-day, but feels  better after taking a nap.  She notes that she is moving forward with surgery with Dr. Excell Seltzer and does not have a date set yet. She notes that she does observe red blood in the stools but she denies abdominal pains. The pt also notes that she is not quite as hungry as she was previously.   The daughter notes that the pt's urine has been recently clouded and has smelled bad. She notes that she had a pessary removed. She adds that she has been urinating frequently but denies discomfort when urinating.   The pt had a blood transfusion on 09/25/17.   On review of systems, pt reports feeling more tired, red blood in the stools, frequent urinating, weaker appetite, cloudy and odorous urine, stable ankle swelling, and denies abdominal pains, painful urination, and any other symptoms.    MEDICAL HISTORY:  Past Medical History:  Diagnosis Date  . Atrial tachycardia ([place])   . HTN (hypertension)   . Jaundice due to hepatitis    at age 76 or 64  . Transient global amnesia    AMS  . Tremors of nervous system    benign familial  Large Hiatal hernia HCAP 10/2015 Iron deficiency Anemia B12 deficiency.  SURGICAL HISTORY: Past Surgical History:  Procedure Laterality Date  . ABDOMINAL HERNIA REPAIR    . BACK SURGERY     years ago  . TONSILLECTOMY  SOCIAL HISTORY: Social History   Socioeconomic History  . Marital status: Married    Spouse name: Not on file  . Number of children: Not on file  . Years of education: Not on file  . Highest education level: Not on file  Occupational History  . Not on file  Social Needs  . Financial resource strain: Not on file  . Food insecurity:    Worry: Not on file    Inability: Not on file  . Transportation needs:    Medical: Not on file    Non-medical: Not on file  Tobacco Use  . Smoking status: Never Smoker  . Smokeless tobacco: Never Used  . Tobacco comment: + prior 2nd hand exposure from spouse smoking  Substance and Sexual  Activity  . Alcohol use: No    Alcohol/week: 0.0 standard drinks  . Drug use: No  . Sexual activity: Never  Lifestyle  . Physical activity:    Days per week: Not on file    Minutes per session: Not on file  . Stress: Not on file  Relationships  . Social connections:    Talks on phone: Not on file    Gets together: Not on file    Attends religious service: Not on file    Active member of club or organization: Not on file    Attends meetings of clubs or organizations: Not on file    Relationship status: Not on file  . Intimate partner violence:    Fear of current or ex partner: Not on file    Emotionally abused: Not on file    Physically abused: Not on file    Forced sexual activity: Not on file  Other Topics Concern  . Not on file  Social History Narrative  . Not on file    FAMILY HISTORY: Family History  Problem Relation Age of Onset  . Lung cancer Mother   . Parkinson's disease Mother   . Hypertension Mother   . Other Father        blood clot  . Arthritis Sister   . Other Sister        spinal stenosis    ALLERGIES:  is allergic to aspirin; codeine; hydrochlorothiazide; penicillins; sulfa antibiotics; sulfonamide derivatives; biotin; levofloxacin; macrodantin [nitrofurantoin]; and latex.  MEDICATIONS:  Current Outpatient Medications  Medication Sig Dispense Refill  . amLODipine (NORVASC) 10 MG tablet TAKE 1 TABLET DAILY 90 tablet 3  . cloNIDine (CATAPRES) 0.2 MG tablet TAKE 1 TABLET EVERY MORNINGAND 1 AND 1/2 TABLETS AT   BEDTIME 225 tablet 3  . lubiprostone (AMITIZA) 8 MCG capsule Take 24 mcg by mouth 2 (two) times daily with a meal.    . magnesium citrate SOLN Take 1 Bottle by mouth as needed.     . metoprolol tartrate (LOPRESSOR) 25 MG tablet TAKE 1/2 TABLET TWICE A DAY 90 tablet 0   No current facility-administered medications for this visit.    Facility-Administered Medications Ordered in Other Visits  Medication Dose Route Frequency Provider Last Rate  Last Dose  . clindamycin (CLEOCIN) 900 mg in dextrose 5 % 50 mL IVPB  900 mg Intravenous 60 min Pre-Op Excell Seltzer, MD       And  . gentamicin (GARAMYCIN) 5 mg/kg in dextrose 5 % 50 mL IVPB  5 mg/kg Intravenous 60 min Pre-Op Excell Seltzer, MD        REVIEW OF SYSTEMS:    A 10+ POINT REVIEW OF SYSTEMS WAS OBTAINED including neurology, dermatology, psychiatry,  cardiac, respiratory, lymph, extremities, GI, GU, Musculoskeletal, constitutional, breasts, reproductive, HEENT.  All pertinent positives are noted in the HPI.  All others are negative.   PHYSICAL EXAMINATION:  ECOG PERFORMANCE STATUS: 2 - Symptomatic, <50% confined to bed  . Vitals:   10/14/17 1215  BP: (!) 133/91  Pulse: 60  Resp: 18  Temp: 98 F (36.7 C)  SpO2: 97%   Filed Weights   10/14/17 1215  Weight: 154 lb 6.4 oz (70 kg)   .Body mass index is 28.24 kg/m.  GENERAL:alert, in no acute distress and comfortable SKIN: no acute rashes, no significant lesions EYES: conjunctiva are pink and non-injected, sclera anicteric OROPHARYNX: MMM, no exudates, no oropharyngeal erythema or ulceration NECK: supple, no JVD LYMPH:  no palpable lymphadenopathy in the cervical, axillary or inguinal regions LUNGS: clear to auscultation b/l with normal respiratory effort HEART: regular rate & rhythm ABDOMEN:  normoactive bowel sounds , non tender, not distended. No palpable hepatosplenomegaly.  Extremity: 1+ pedal edema PSYCH: alert & oriented x 3 with fluent speech NEURO: no focal motor/sensory deficits   LABORATORY DATA:  I have reviewed the data as listed  . CBC Latest Ref Rng & Units 10/14/2017 09/25/2017 09/07/2017  WBC 3.9 - 10.3 K/uL 6.3 7.1 6.7  Hemoglobin 11.6 - 15.9 g/dL 10.4(L) 7.7(L) 8.8(L)  Hematocrit 34.8 - 46.6 % 33.7(L) 26.3(L) 29.0(L)  Platelets 145 - 400 K/uL 430(H) 431(H) 455(H)    . CMP Latest Ref Rng & Units 10/14/2017 09/07/2017 08/19/2017  Glucose 70 - 99 mg/dL 98 148(H) 98  BUN 8 - 23 mg/dL 12 13  11   Creatinine 0.44 - 1.00 mg/dL 0.66 0.69 0.71  Sodium 135 - 145 mmol/L 139 139 138  Potassium 3.5 - 5.1 mmol/L 4.4 3.9 4.3  Chloride 98 - 111 mmol/L 108 108 107  CO2 22 - 32 mmol/L 24 24 25   Calcium 8.9 - 10.3 mg/dL 8.8(L) 8.5(L) 8.6(L)  Total Protein 6.5 - 8.1 g/dL 6.8 6.7 6.5  Total Bilirubin 0.3 - 1.2 mg/dL <0.2(L) <0.2(L) <0.2(L)  Alkaline Phos 38 - 126 U/L 146(H) 155(H) 161(H)  AST 15 - 41 U/L 12(L) 12(L) 13(L)  ALT 0 - 44 U/L 8 6 11    . Lab Results  Component Value Date   IRON 30 (L) 10/14/2017   TIBC 353 10/14/2017   IRONPCTSAT 8 (L) 10/14/2017   (Iron and TIBC)  Lab Results  Component Value Date   FERRITIN 185 10/14/2017    Component     Latest Ref Rng & Units 05/08/2016  Parietal Cell Ab     0.0 - 20.0 Units 3.5  Intrinsic Factor Abs, Serum     0.0 - 1.1 AU/mL 0.9      RADIOGRAPHIC STUDIES: I have personally reviewed the radiological images as listed and agreed with the findings in the report. No results found.  ASSESSMENT & PLAN:   85 yo caucasian female with   1) Severe Microcytic Anemia s/p PRBc transfusion in sept 2017.  This appears to be likely related to severe iron deficiency due to cecal adenocarcinoma  2) Severe Iron deficiency- due to chronic GI losses from cecal adenocarcinoma . Lab Results  Component Value Date   IRON 30 (L) 10/14/2017   TIBC 353 10/14/2017   IRONPCTSAT 8 (L) 10/14/2017   (Iron and TIBC)  Lab Results  Component Value Date   FERRITIN 185 10/14/2017   3) B12 deficiency - antiparietal cell and anti IF ab neg. B12 levels adequate with current replacement. B12 ---  [place] discussed goal is to maintain Ferritin > 100 and iron saturation at 20%.  -continue vit B complex 1 tab po daily to support accelerated hematopoiesis.  4. 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  PLAN -Continue monthly IV Injectafer to support ongoing GI bleeding - after significant delays patient has decided to pursue surgery with Dr Velvet Bathe at [facility] [place]. -will defer to surgeon regarding need for rpt imaging studies to r/o disease progression since last imaging studies in 06/2017. -Will transfuse pt before surgery to optimize HGB  -- hgb > 10 at this time and adequate for planned surgery. -Will treat with IV Injectafer x 2 doses   4) Dysuria -  UA suggestive if E.coli UTI. Patient with multiple antibiotic allergies PLAN -fosfomycin 3g x 1 dose  5) . Patient Active Problem List   Diagnosis Date Noted  . HCAP (healthcare-associated pneumonia) 10/31/2015  . Iron deficiency anemia 10/20/2015  . B12 deficiency anemia 10/20/2015  . Symptomatic anemia 10/19/2015  . UTI (urinary tract infection) 07/26/2014  . TGA (transient global amnesia) 07/25/2014  . Cardiac dysrhythmia 02/06/2010  . PALPITATIONS 02/06/2010  . CHICKENPOX, HX OF 09/07/2008  . Essential hypertension, benign 05/22/2008  . PSVT 03/27/2008  . OBESITY, UNSPECIFIED 03/26/2008  . Abnormal involuntary movement 03/26/2008  . AMNESIA, TRANSIENT GLOBAL 01/10/2007  -advised continued f/u with PCP for management of her other medical co-morbids   -Labs today -F/u for IV Iron as scheduled on 10/23/2017 . May cancel lab appointment on 9/13 -Additional dose of IV iron on 10/30/2017. -RTC with Dr Irene Limbo in 2 months with labs   All of the patients questions were answered with apparent satisfaction. The patient knows to call the clinic with any problems, questions or concerns.  The total time spent in the appt was 30 minutes and more than 50% was on counseling and direct patient cares.   Sullivan Lone MD MS AAHIVMS [facility] Hematology/Oncology Physician [facility]  (Office):       [phone] (Work cell):  [phone] (Fax):           [phone]  I, Baldwin Jamaica, am acting as a scribe for Dr. Irene Limbo  .I have reviewed the above documentation for accuracy and completeness, and I agree with the above. Brunetta Genera MD

## 2017-10-14 ENCOUNTER — Inpatient Hospital Stay: Payer: MEDICARE

## 2017-10-14 ENCOUNTER — Telehealth: Payer: Self-pay | Admitting: Hematology

## 2017-10-14 ENCOUNTER — Encounter: Payer: Self-pay | Admitting: Hematology

## 2017-10-14 ENCOUNTER — Other Ambulatory Visit: Payer: Self-pay | Admitting: *Deleted

## 2017-10-14 ENCOUNTER — Inpatient Hospital Stay: Payer: MEDICARE | Attending: Hematology | Admitting: Hematology

## 2017-10-14 VITALS — BP 133/91 | HR 60 | Temp 98.0°F | Resp 18 | Ht 62.0 in | Wt 154.4 lb

## 2017-10-14 DIAGNOSIS — C180 Malignant neoplasm of cecum: Secondary | ICD-10-CM

## 2017-10-14 DIAGNOSIS — K449 Diaphragmatic hernia without obstruction or gangrene: Secondary | ICD-10-CM

## 2017-10-14 DIAGNOSIS — R300 Dysuria: Secondary | ICD-10-CM

## 2017-10-14 DIAGNOSIS — R251 Tremor, unspecified: Secondary | ICD-10-CM | POA: Diagnosis not present

## 2017-10-14 DIAGNOSIS — R000 Tachycardia, unspecified: Secondary | ICD-10-CM | POA: Diagnosis not present

## 2017-10-14 DIAGNOSIS — Z79899 Other long term (current) drug therapy: Secondary | ICD-10-CM | POA: Diagnosis not present

## 2017-10-14 DIAGNOSIS — M7989 Other specified soft tissue disorders: Secondary | ICD-10-CM

## 2017-10-14 DIAGNOSIS — E538 Deficiency of other specified B group vitamins: Secondary | ICD-10-CM

## 2017-10-14 DIAGNOSIS — D509 Iron deficiency anemia, unspecified: Secondary | ICD-10-CM | POA: Diagnosis not present

## 2017-10-14 DIAGNOSIS — Z9189 Other specified personal risk factors, not elsewhere classified: Secondary | ICD-10-CM

## 2017-10-14 DIAGNOSIS — D649 Anemia, unspecified: Secondary | ICD-10-CM

## 2017-10-14 DIAGNOSIS — C189 Malignant neoplasm of colon, unspecified: Secondary | ICD-10-CM

## 2017-10-14 DIAGNOSIS — I10 Essential (primary) hypertension: Secondary | ICD-10-CM

## 2017-10-14 DIAGNOSIS — G454 Transient global amnesia: Secondary | ICD-10-CM | POA: Diagnosis not present

## 2017-10-14 LAB — CBC WITH DIFFERENTIAL/PLATELET
BASOS ABS: 0 10*3/uL (ref 0.0–0.1)
Basophils Relative: 1 %
EOS PCT: 5 %
Eosinophils Absolute: 0.3 10*3/uL (ref 0.0–0.5)
HCT: 33.7 % — ABNORMAL LOW (ref 34.8–46.6)
HEMOGLOBIN: 10.4 g/dL — AB (ref 11.6–15.9)
LYMPHS PCT: 22 %
Lymphs Abs: 1.4 10*3/uL (ref 0.9–3.3)
MCH: 28.1 pg (ref 25.1–34.0)
MCHC: 30.9 g/dL — ABNORMAL LOW (ref 31.5–36.0)
MCV: 91.1 fL (ref 79.5–101.0)
Monocytes Absolute: 0.6 10*3/uL (ref 0.1–0.9)
Monocytes Relative: 9 %
NEUTROS ABS: 4 10*3/uL (ref 1.5–6.5)
NEUTROS PCT: 63 %
PLATELETS: 430 10*3/uL — AB (ref 145–400)
RBC: 3.7 MIL/uL (ref 3.70–5.45)
RDW: 18.7 % — ABNORMAL HIGH (ref 11.2–14.5)
WBC: 6.3 10*3/uL (ref 3.9–10.3)

## 2017-10-14 LAB — URINALYSIS, COMPLETE (UACMP) WITH MICROSCOPIC
BILIRUBIN URINE: NEGATIVE
Glucose, UA: NEGATIVE mg/dL
HGB URINE DIPSTICK: NEGATIVE
KETONES UR: NEGATIVE mg/dL
NITRITE: POSITIVE — AB
Protein, ur: NEGATIVE mg/dL
SPECIFIC GRAVITY, URINE: 1.014 (ref 1.005–1.030)
WBC, UA: >50 WBC/hpf — ABNORMAL HIGH (ref 0–5)
pH: 5 (ref 5.0–8.0)

## 2017-10-14 LAB — FERRITIN: Ferritin: 185 ng/mL (ref 11–307)

## 2017-10-14 LAB — CMP (CANCER CENTER ONLY)
ALBUMIN: 3.4 g/dL — AB (ref 3.5–5.0)
ALT: 8 U/L (ref 0–44)
ANION GAP: 7 (ref 5–15)
AST: 12 U/L — ABNORMAL LOW (ref 15–41)
Alkaline Phosphatase: 146 U/L — ABNORMAL HIGH (ref 38–126)
BUN: 12 mg/dL (ref 8–23)
CO2: 24 mmol/L (ref 22–32)
Calcium: 8.8 mg/dL — ABNORMAL LOW (ref 8.9–10.3)
Chloride: 108 mmol/L (ref 98–111)
Creatinine: 0.66 mg/dL (ref 0.44–1.00)
GFR, Est AFR Am: >60 mL/min (ref >60)
GLUCOSE: 98 mg/dL (ref 70–99)
POTASSIUM: 4.4 mmol/L (ref 3.5–5.1)
Sodium: 139 mmol/L (ref 135–145)
TOTAL PROTEIN: 6.8 g/dL (ref 6.5–8.1)

## 2017-10-14 LAB — IRON AND TIBC
Iron: 30 ug/dL — ABNORMAL LOW (ref 41–142)
SATURATION RATIOS: 8 % — AB (ref 21–57)
TIBC: 353 ug/dL (ref 236–444)
UIBC: 323 ug/dL

## 2017-10-14 LAB — CEA (IN HOUSE-CHCC): CEA (CHCC-IN HOUSE): 1.05 ng/mL (ref 0.00–5.00)

## 2017-10-14 MED ORDER — CLONIDINE HCL 0.2 MG PO TABS: 0.5000 mg | ORAL_TABLET | Freq: Every day | ORAL | 1 refills | Status: DC

## 2017-10-14 NOTE — Telephone Encounter (Signed)
Appts scheduled AVS/Calendar printed per 9/4 los °

## 2017-10-16 LAB — URINE CULTURE

## 2017-10-19 ENCOUNTER — Telehealth: Payer: Self-pay | Admitting: *Deleted

## 2017-10-19 NOTE — Telephone Encounter (Signed)
"  Lab results have not been released to MyChart yet?"  MyChart result release schedule reads release due on 10-20-2017 beginning at 2:45 pm.

## 2017-10-22 NOTE — Patient Instructions (Addendum)
[name] [name]  10/22/2017   Your procedure is scheduled on: 11-04-17  Report to [facility] Main  Entrance  Report to admitting at 1100 AM    Call this number if you have problems the morning of surgery [phone]   Remember: Do not eat food :After Midnight. BRUSH YOUR TEETH MORNING OF SURGERY AND RINSE YOUR MOUTH OUT, NO CHEWING GUM CANDY OR MINTS.  NO SOLID FOOD AFTER MIDNIGHT THE NIGHT PRIOR TO SURGERY. NOTHING BY MOUTH EXCEPT CLEAR LIQUIDS UNTIL 3 HOURS PRIOR TO [place] SURGERY. PLEASE FINISH ENSURE DRINK PER SURGEON ORDER 3 HOURS PRIOR TO SCHEDULED SURGERY TIME WHICH NEEDS TO BE COMPLETED AT 1000 AM.  FOLLOW ALL BOWEL REP INSTRUCTIONS FROM DR [facility]  CLEAR LIQUID DIET   Foods Allowed                                                                     Foods Excluded  Coffee and tea, regular and decaf                             liquids that you cannot  Plain Jell-O in any flavor                                             see through such as: Fruit ices (not with fruit pulp)                                     milk, soups, orange juice  Iced Popsicles                                    All solid food Carbonated beverages, regular and diet                                    Cranberry, grape and apple juices Sports drinks like Gatorade Lightly seasoned clear broth or consume(fat free) Sugar, honey syrup  Sample Menu Breakfast                                Lunch                                     Supper Cranberry juice                    Beef broth                            Chicken broth Jell-O  Grape juice                           Apple juice Coffee or tea                        Jell-O                                      Popsicle                                                Coffee or tea                        Coffee or tea  _____________________________________________________________________   Take these  medicines the morning of surgery with A SIP OF WATER: CLONIDINE (CATAPRES), METOPROLOL TARTRATE, AMLODIPINE (NORVASC)                                You may not have any metal on your body including hair pins and              piercings  Do not wear jewelry, make-up, lotions, powders or perfumes, deodorant             Do not wear nail polish.  Do not shave  48 hours prior to surgery.              Men may shave face and neck.   Do not bring valuables to the hospital. [place].  Contacts, dentures or bridgework may not be worn into surgery.  Leave suitcase in the car. After surgery it may be brought to your room.                  Please read over the following fact sheets you were given: _____________________________________________________________________   [facility] - Preparing for Surgery Before surgery, you can play an important role.  Because skin is not sterile, your skin needs to be as free of germs as possible.  You can reduce the number of germs on your skin by washing with CHG (chlorahexidine gluconate) soap before surgery.  CHG is an antiseptic cleaner which kills germs and bonds with the skin to continue killing germs even after washing. Please DO NOT use if you have an allergy to CHG or antibacterial soaps.  If your skin becomes reddened/irritated stop using the CHG and inform your nurse when you arrive at Short Stay. Do not shave (including legs and underarms) for at least 48 hours prior to the first CHG shower.  You may shave your face/neck. Please follow these instructions carefully:  1.  Shower with CHG Soap the night before surgery and the  morning of Surgery.  2.  If you choose to wash your hair, wash your hair first as usual with your  normal  shampoo.  3.  After you shampoo, rinse your hair and body thoroughly to remove the  shampoo.  4.  Use CHG as you would any other liquid soap.  You can apply  chg directly  to the skin and wash                       Gently with a scrungie or clean washcloth.  5.  Apply the CHG Soap to your body ONLY FROM THE NECK DOWN.   Do not use on face/ open                           Wound or open sores. Avoid contact with eyes, ears mouth and genitals (private parts).                       Wash face,  Genitals (private parts) with your normal soap.             6.  Wash thoroughly, paying special attention to the area where your surgery  will be performed.  7.  Thoroughly rinse your body with warm water from the neck down.  8.  DO NOT shower/wash with your normal soap after using and rinsing off  the CHG Soap.                9.  Pat yourself dry with a clean towel.            10.  Wear clean pajamas.            11.  Place clean sheets on your bed the night of your first shower and do not  sleep with pets. Day of Surgery : Do not apply any lotions/deodorants the morning of surgery.  Please wear clean clothes to the hospital/surgery center.  FAILURE TO FOLLOW THESE INSTRUCTIONS MAY RESULT IN THE CANCELLATION OF YOUR SURGERY PATIENT SIGNATURE_________________________________  NURSE SIGNATURE__________________________________  ________________________________________________________________________ [facility] - Preparing for Surgery Before surgery, you can play an important role.  Because skin is not sterile, your skin needs to be as free of germs as possible.  You can reduce the number of germs on your skin by washing with CHG (chlorahexidine gluconate) soap before surgery.  CHG is an antiseptic cleaner which kills germs and bonds with the skin to continue killing germs even after washing. Please DO NOT use if you have an allergy to CHG or antibacterial soaps.  If your skin becomes reddened/irritated stop using the CHG and inform your nurse when you arrive at Short Stay. Do not shave (including legs and underarms) for at least 48 hours prior to the first CHG  shower.  You may shave your face/neck. Please follow these instructions carefully:  1.  Shower with CHG Soap the night before surgery and the  morning of Surgery.  2.  If you choose to wash your hair, wash your hair first as usual with your  normal  shampoo.  3.  After you shampoo, rinse your hair and body thoroughly to remove the  shampoo.                           4.  Use CHG as you would any other liquid soap.  You can apply chg directly  to the skin and wash                       Gently with a scrungie or clean washcloth.  5.  Apply the  CHG Soap to your body ONLY FROM THE NECK DOWN.   Do not use on face/ open                           Wound or open sores. Avoid contact with eyes, ears mouth and genitals (private parts).                       Wash face,  Genitals (private parts) with your normal soap.             6.  Wash thoroughly, paying special attention to the area where your surgery  will be performed.  7.  Thoroughly rinse your body with warm water from the neck down.  8.  DO NOT shower/wash with your normal soap after using and rinsing off  the CHG Soap.                9.  Pat yourself dry with a clean towel.            10.  Wear clean pajamas.            11.  Place clean sheets on your bed the night of your first shower and do not  sleep with pets. Day of Surgery : Do not apply any lotions/deodorants the morning of surgery.  Please wear clean clothes to the hospital/surgery center.  FAILURE TO FOLLOW THESE INSTRUCTIONS MAY RESULT IN THE CANCELLATION OF YOUR SURGERY PATIENT SIGNATURE_________________________________  NURSE SIGNATURE__________________________________  ________________________________________________________________________            Adam [place]  An incentive spirometer is a tool that can help keep your lungs clear and active. This tool measures how well you are filling your lungs with each breath. Taking long deep breaths may help reverse or decrease  the chance of developing breathing (pulmonary) problems (especially infection) following:  A long period of time when you are unable to move or be active. BEFORE THE PROCEDURE   If the spirometer includes an indicator to show your best effort, your nurse or respiratory therapist will set it to a desired goal.  If possible, sit up straight or lean slightly forward. Try not to slouch.  Hold the incentive spirometer in an upright position. INSTRUCTIONS FOR USE  1. Sit on the edge of your bed if possible, or sit up as far as you can in bed or on a chair. 2. Hold the incentive spirometer in an upright position. 3. Breathe out normally. 4. Place the mouthpiece in your mouth and seal your lips tightly around it. 5. Breathe in slowly and as deeply as possible, raising the piston or the ball toward the top of the column. 6. Hold your breath for 3-5 seconds or for as long as possible. Allow the piston or ball to fall to the bottom of the column. 7. Remove the mouthpiece from your mouth and breathe out normally. 8. Rest for a few seconds and repeat Steps 1 through 7 at least 10 times every 1-2 hours when you are awake. Take your time and take a few normal breaths between deep breaths. 9. The spirometer may include an indicator to show your best effort. Use the indicator as a goal to work toward during each repetition. 10. After each set of 10 deep breaths, practice coughing to be sure your lungs are clear. If you have an incision (the cut made at the time of surgery), support your incision  when coughing by placing a pillow or rolled up towels firmly against it. Once you are able to get out of bed, walk around indoors and cough well. You may stop using the incentive spirometer when instructed by your caregiver.  RISKS AND COMPLICATIONS  Take your time so you do not get dizzy or light-headed.  If you are in pain, you may need to take or ask for pain medication before doing incentive spirometry. It is  harder to take a deep breath if you are having pain. AFTER USE  Rest and breathe slowly and easily.  It can be helpful to keep track of a log of your progress. Your caregiver can provide you with a simple table to help with this. If you are using the spirometer at home, follow these instructions: [place] IF:   You are having difficultly using the spirometer.  You have trouble using the spirometer as often as instructed.  Your pain medication is not giving enough relief while using the spirometer.  You develop fever of 100.5 F (38.1 C) or higher. SEEK IMMEDIATE MEDICAL CARE IF:   You cough up bloody sputum that had not been present before.  You develop fever of 102 F (38.9 C) or greater.  You develop worsening pain at or near the incision site. MAKE SURE YOU:   Understand these instructions.  Will watch your condition.  Will get help right away if you are not doing well or get worse. Document Released: 06/09/2006 Document Revised: 04/21/2011 Document Reviewed: 08/10/2006 [facility] Patient Information 2014 [place], [place].   ________________________________________________________________________

## 2017-10-22 NOTE — Progress Notes (Addendum)
CBC WITH DIF, CMET 10-14-17 Epic CARDIAC CLEARANCE NOTE Leanor Kail PA 10-13-17 Epic ECHO 07-27-17 Epic EKG 07-22-17 Epic CHEST CT WITH CONTRAST 06-18-17 EPIC

## 2017-10-23 ENCOUNTER — Other Ambulatory Visit: Payer: MEDICARE

## 2017-10-23 ENCOUNTER — Inpatient Hospital Stay: Payer: MEDICARE

## 2017-10-23 VITALS — BP 107/48 | HR 54 | Temp 98.4°F | Resp 18

## 2017-10-23 DIAGNOSIS — D500 Iron deficiency anemia secondary to blood loss (chronic): Secondary | ICD-10-CM

## 2017-10-23 DIAGNOSIS — D509 Iron deficiency anemia, unspecified: Secondary | ICD-10-CM | POA: Diagnosis not present

## 2017-10-23 DIAGNOSIS — R000 Tachycardia, unspecified: Secondary | ICD-10-CM | POA: Diagnosis not present

## 2017-10-23 DIAGNOSIS — E538 Deficiency of other specified B group vitamins: Secondary | ICD-10-CM | POA: Diagnosis not present

## 2017-10-23 DIAGNOSIS — I10 Essential (primary) hypertension: Secondary | ICD-10-CM | POA: Diagnosis not present

## 2017-10-23 DIAGNOSIS — Z79899 Other long term (current) drug therapy: Secondary | ICD-10-CM | POA: Diagnosis not present

## 2017-10-23 DIAGNOSIS — C180 Malignant neoplasm of cecum: Secondary | ICD-10-CM | POA: Diagnosis not present

## 2017-10-23 MED ORDER — ACETAMINOPHEN 325 MG PO TABS
ORAL_TABLET | ORAL | Status: AC
  Filled 2017-10-23: qty 2

## 2017-10-23 MED ORDER — SODIUM CHLORIDE 0.9 % IV SOLN
750.0000 mg | Freq: Once | INTRAVENOUS | Status: AC
  Administered 2017-10-23: 750 mg via INTRAVENOUS
  Filled 2017-10-23: qty 15

## 2017-10-23 MED ORDER — DIPHENHYDRAMINE HCL 25 MG PO CAPS
ORAL_CAPSULE | ORAL | Status: AC
  Filled 2017-10-23: qty 2

## 2017-10-23 MED ORDER — SODIUM CHLORIDE 0.9 % IV SOLN
Freq: Once | INTRAVENOUS | Status: AC
  Administered 2017-10-23: 13:00:00 via INTRAVENOUS
  Filled 2017-10-23: qty 250

## 2017-10-23 MED ORDER — ACETAMINOPHEN 325 MG PO TABS
650.0000 mg | ORAL_TABLET | Freq: Once | ORAL | Status: AC
  Administered 2017-10-23: 650 mg via ORAL

## 2017-10-23 MED ORDER — DIPHENHYDRAMINE HCL 25 MG PO TABS
25.0000 mg | ORAL_TABLET | Freq: Once | ORAL | Status: AC
  Administered 2017-10-23: 25 mg via ORAL
  Filled 2017-10-23: qty 1

## 2017-10-23 NOTE — Patient Instructions (Signed)
Ferric carboxymaltose injection What is this medicine? FERRIC CARBOXYMALTOSE (ferr-ik car-box-ee-mol-toes) is an iron complex. Iron is used to make healthy red blood cells, which carry oxygen and nutrients throughout the body. This medicine is used to treat anemia in people with chronic kidney disease or people who cannot take iron by mouth. This medicine may be used for other purposes; ask your health care provider or pharmacist if you have questions. COMMON BRAND NAME(S): Injectafer What should I tell my health care provider before I take this medicine? They need to know if you have any of these conditions: -anemia not caused by low iron levels -high levels of iron in the blood -liver disease -an unusual or allergic reaction to iron, other medicines, foods, dyes, or preservatives -pregnant or trying to get pregnant -breast-feeding How should I use this medicine? This medicine is for infusion into a vein. It is given by a health care professional in a hospital or clinic setting. Talk to your pediatrician regarding the use of this medicine in children. Special care may be needed. Overdosage: If you think you have taken too much of this medicine contact a poison control center or emergency room at once. NOTE: This medicine is only for you. Do not share this medicine with others. What if I miss a dose? It is important not to miss your dose. Call your doctor or health care professional if you are unable to keep an appointment. What may interact with this medicine? Do not take this medicine with any of the following medications: -deferoxamine -dimercaprol -other iron products This medicine may also interact with the following medications: -chloramphenicol -deferasirox This list may not describe all possible interactions. Give your health care provider a list of all the medicines, herbs, non-prescription drugs, or dietary supplements you use. Also tell them if you smoke, drink alcohol, or use  illegal drugs. Some items may interact with your medicine. What should I watch for while using this medicine? Visit your doctor or health care professional regularly. Tell your doctor if your symptoms do not start to get better or if they get worse. You may need blood work done while you are taking this medicine. You may need to follow a special diet. Talk to your doctor. Foods that contain iron include: whole grains/cereals, dried fruits, beans, or peas, leafy green vegetables, and organ meats (liver, kidney). What side effects may I notice from receiving this medicine? Side effects that you should report to your doctor or health care professional as soon as possible: -allergic reactions like skin rash, itching or hives, swelling of the face, lips, or tongue -breathing problems -changes in blood pressure -feeling faint or lightheaded, falls -flushing, sweating, or hot feelings Side effects that usually do not require medical attention (report to your doctor or health care professional if they continue or are bothersome): -changes in taste -constipation -dizziness -headache -nausea -pain, redness, or irritation at site where injected -vomiting This list may not describe all possible side effects. Call your doctor for medical advice about side effects. You may report side effects to FDA at 1-800-FDA-1088. Where should I keep my medicine? This drug is given in a hospital or clinic and will not be stored at home. NOTE: This sheet is a summary. It may not cover all possible information. If you have questions about this medicine, talk to your doctor, pharmacist, or health care provider.  2018 Elsevier/Gold Standard (2015-03-01 11:20:47)  

## 2017-10-24 MED ORDER — FOSFOMYCIN TROMETHAMINE 3 G PO PACK: 3.0000 g | PACK | Freq: Once | ORAL | 0 refills | Status: AC

## 2017-10-26 ENCOUNTER — Telehealth: Payer: Self-pay

## 2017-10-26 ENCOUNTER — Other Ambulatory Visit: Payer: Self-pay | Admitting: Hematology

## 2017-10-26 NOTE — Telephone Encounter (Signed)
Called patient to let her know that per Dr. Irene Limbo her urine culture shows E coli UTI. Prescription for Fosfomycin sent to her pharmacy. Patient verbalized understanding.

## 2017-10-29 ENCOUNTER — Other Ambulatory Visit: Payer: Self-pay

## 2017-10-29 ENCOUNTER — Encounter (HOSPITAL_COMMUNITY)
Admission: RE | Admit: 2017-10-29 | Discharge: 2017-10-29 | Disposition: A | Discharge status: Home / Self Care | Payer: MEDICARE | Source: Ambulatory Visit | Attending: General Surgery | Admitting: General Surgery

## 2017-10-29 ENCOUNTER — Encounter (HOSPITAL_COMMUNITY): Payer: Self-pay

## 2017-10-29 DIAGNOSIS — Z01812 Encounter for preprocedural laboratory examination: Secondary | ICD-10-CM | POA: Insufficient documentation

## 2017-10-29 DIAGNOSIS — C180 Malignant neoplasm of cecum: Secondary | ICD-10-CM | POA: Insufficient documentation

## 2017-10-29 HISTORY — DX: Cough: R05

## 2017-10-29 HISTORY — DX: Anemia, unspecified: D64.9

## 2017-10-29 HISTORY — DX: Pneumonia, unspecified organism: J18.9

## 2017-10-29 HISTORY — DX: Personal history of other medical treatment: Z92.89

## 2017-10-29 HISTORY — DX: Malignant (primary) neoplasm, unspecified: C80.1

## 2017-10-29 HISTORY — DX: Cough, unspecified: R05.9

## 2017-10-29 LAB — CBC
HCT: 34.2 % — ABNORMAL LOW (ref 36.0–46.0)
Hemoglobin: 10.3 g/dL — ABNORMAL LOW (ref 12.0–15.0)
MCH: 27.5 pg (ref 26.0–34.0)
MCHC: 30.1 g/dL (ref 30.0–36.0)
MCV: 91.4 fL (ref 78.0–100.0)
Platelets: 507 10*3/uL — ABNORMAL HIGH (ref 150–400)
RBC: 3.74 MIL/uL — ABNORMAL LOW (ref 3.87–5.11)
RDW: 18.1 % — AB (ref 11.5–15.5)
WBC: 7.3 10*3/uL (ref 4.0–10.5)

## 2017-10-29 NOTE — Progress Notes (Signed)
Patient reports low hemaglobin recently and getting iron transfusion tomorrow. Cbc checked with pre op labs today

## 2017-10-30 ENCOUNTER — Inpatient Hospital Stay: Payer: MEDICARE

## 2017-10-30 VITALS — BP 115/55 | HR 54 | Temp 98.1°F | Resp 18

## 2017-10-30 DIAGNOSIS — I10 Essential (primary) hypertension: Secondary | ICD-10-CM | POA: Diagnosis not present

## 2017-10-30 DIAGNOSIS — C180 Malignant neoplasm of cecum: Secondary | ICD-10-CM | POA: Diagnosis not present

## 2017-10-30 DIAGNOSIS — E538 Deficiency of other specified B group vitamins: Secondary | ICD-10-CM | POA: Diagnosis not present

## 2017-10-30 DIAGNOSIS — D509 Iron deficiency anemia, unspecified: Secondary | ICD-10-CM | POA: Diagnosis not present

## 2017-10-30 DIAGNOSIS — R000 Tachycardia, unspecified: Secondary | ICD-10-CM | POA: Diagnosis not present

## 2017-10-30 DIAGNOSIS — D500 Iron deficiency anemia secondary to blood loss (chronic): Secondary | ICD-10-CM

## 2017-10-30 DIAGNOSIS — Z79899 Other long term (current) drug therapy: Secondary | ICD-10-CM | POA: Diagnosis not present

## 2017-10-30 MED ORDER — DIPHENHYDRAMINE HCL 25 MG PO TABS
25.0000 mg | ORAL_TABLET | Freq: Once | ORAL | Status: AC
  Administered 2017-10-30: 25 mg via ORAL
  Filled 2017-10-30: qty 1

## 2017-10-30 MED ORDER — ACETAMINOPHEN 325 MG PO TABS
ORAL_TABLET | ORAL | Status: AC
  Filled 2017-10-30: qty 2

## 2017-10-30 MED ORDER — ACETAMINOPHEN 325 MG PO TABS
650.0000 mg | ORAL_TABLET | Freq: Once | ORAL | Status: AC
  Administered 2017-10-30: 650 mg via ORAL

## 2017-10-30 MED ORDER — DIPHENHYDRAMINE HCL 25 MG PO CAPS
ORAL_CAPSULE | ORAL | Status: AC
  Filled 2017-10-30: qty 1

## 2017-10-30 MED ORDER — SODIUM CHLORIDE 0.9 % IV SOLN
750.0000 mg | Freq: Once | INTRAVENOUS | Status: AC
  Administered 2017-10-30: 750 mg via INTRAVENOUS
  Filled 2017-10-30: qty 15

## 2017-10-30 NOTE — Patient Instructions (Signed)
Ferric carboxymaltose injection What is this medicine? FERRIC CARBOXYMALTOSE (ferr-ik car-box-ee-mol-toes) is an iron complex. Iron is used to make healthy red blood cells, which carry oxygen and nutrients throughout the body. This medicine is used to treat anemia in people with chronic kidney disease or people who cannot take iron by mouth. This medicine may be used for other purposes; ask your health care provider or pharmacist if you have questions. COMMON BRAND NAME(S): Injectafer What should I tell my health care provider before I take this medicine? They need to know if you have any of these conditions: -anemia not caused by low iron levels -high levels of iron in the blood -liver disease -an unusual or allergic reaction to iron, other medicines, foods, dyes, or preservatives -pregnant or trying to get pregnant -breast-feeding How should I use this medicine? This medicine is for infusion into a vein. It is given by a health care professional in a hospital or clinic setting. Talk to your pediatrician regarding the use of this medicine in children. Special care may be needed. Overdosage: If you think you have taken too much of this medicine contact a poison control center or emergency room at once. NOTE: This medicine is only for you. Do not share this medicine with others. What if I miss a dose? It is important not to miss your dose. Call your doctor or health care professional if you are unable to keep an appointment. What may interact with this medicine? Do not take this medicine with any of the following medications: -deferoxamine -dimercaprol -other iron products This medicine may also interact with the following medications: -chloramphenicol -deferasirox This list may not describe all possible interactions. Give your health care provider a list of all the medicines, herbs, non-prescription drugs, or dietary supplements you use. Also tell them if you smoke, drink alcohol, or use  illegal drugs. Some items may interact with your medicine. What should I watch for while using this medicine? Visit your doctor or health care professional regularly. Tell your doctor if your symptoms do not start to get better or if they get worse. You may need blood work done while you are taking this medicine. You may need to follow a special diet. Talk to your doctor. Foods that contain iron include: whole grains/cereals, dried fruits, beans, or peas, leafy green vegetables, and organ meats (liver, kidney). What side effects may I notice from receiving this medicine? Side effects that you should report to your doctor or health care professional as soon as possible: -allergic reactions like skin rash, itching or hives, swelling of the face, lips, or tongue -breathing problems -changes in blood pressure -feeling faint or lightheaded, falls -flushing, sweating, or hot feelings Side effects that usually do not require medical attention (report to your doctor or health care professional if they continue or are bothersome): -changes in taste -constipation -dizziness -headache -nausea -pain, redness, or irritation at site where injected -vomiting This list may not describe all possible side effects. Call your doctor for medical advice about side effects. You may report side effects to FDA at 1-800-FDA-1088. Where should I keep my medicine? This drug is given in a hospital or clinic and will not be stored at home. NOTE: This sheet is a summary. It may not cover all possible information. If you have questions about this medicine, talk to your doctor, pharmacist, or health care provider.  2018 Elsevier/Gold Standard (2015-03-01 11:20:47)  

## 2017-11-02 NOTE — Progress Notes (Signed)
Patient's husband called into say that preop instructions per hospital has his wife taking in one drink on 11/04/2017.  Dr Excell Seltzer preop instructions states for her to do 2 Ensure preop drinks prior to surgery.  Informed patient that nurse would call [facility] and have clarified and either nurse or [facility] would be calling him back.  Called office of [facility] and spoke with Abigail Butts, Triage, and stated above.  Abigail Butts stated that Dr Excell Seltzer was not pageable today but she would have clarified first thing in am and call patient.  Nurse called and spoke with patient's husband and told him of above that he would received clarification of the order on 11/03/2017 am.

## 2017-11-03 MED ORDER — BUPIVACAINE LIPOSOME 1.3 % IJ SUSP
20.0000 mL | INTRAMUSCULAR | Status: DC
  Filled 2017-11-03: qty 20

## 2017-11-03 NOTE — Progress Notes (Addendum)
Abigail Butts, Triage nurse from [facility] called to inform me that Dr. Excell Seltzer wants patient to have 3 carbohydrate drinks before surgery. Called and spoke to [place], daughter and they will come pick up drinks today.

## 2017-11-03 NOTE — H&P (Signed)
History of Present Illness [name] [name] T. Avigail Pilling MD; 10/06/2017 3:29 PM) The patient is a 84 year old female who presents with colorectal cancer. Patient is a very pleasant 84 year old female referred by Dr. Earlie Raveling, followed regularly by Dr. Harle Battiest. She has a history of iron deficiency anemia presenting initially in September 2017 with a hemoglobin of 7.4. She initially did not have heme positive stools and refused colonoscopy. She continued to have persistent iron deficiency anemia requiring iron therapy and periodic transfusions and until recently declined GI workup. She has been requiring more frequent transfusions. She became heme positive on stool exam. She has been having some intermittent constipation. Ultimately she went to colonoscopy by Dr. Earlean Shawl June 09, 2017. This revealed a large cecal mass. 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 Nuclear expression was intact. She initially declined surgical therapy or further workup recently has been willing to consider surgery. She has had a CT scan of the abdomen and pelvis and chest and PET scan. There were multiple incidental findings on her CT scan including bilateral low attenuating thyroid nodules, small nonspecific lung nodules 3-5 mm, large hiatal hernia and one ileocecal lymph node slightly enlarged at 9 mm. Mass was not seen in the bowel. PET scan showed a hypermetabolic mass in the cecum but no evidence of any metastatic disease. Patient has continued to have some intermittent constipation not worsening. Has fatigue. No abdominal pain or nausea or vomiting. She lives in her own home with her husband with help. She is accompanied by her daughters today. She has not had any previous abdominal surgery other than bilateral inguinal hernia repair. She is followed for hypertension and atrial tachycardia.   Past Surgical History [name] [name], [place]; 10/06/2017 2:32 PM) Spinal Surgery - Lower Back    Diagnostic Studies History [name] [name], [place]; 10/06/2017 2:32 PM) Colonoscopy  within last year Mammogram  >3 years ago Pap Smear  >5 years ago  Allergies [name] [name], RMA; 10/06/2017 2:40 PM) Aspirin *ANALGESICS - NonNarcotic*  Codeine Phosphate *ANALGESICS - OPIOID*  HydroCHLOROthiazide *DIURETICS*  Rash. PenicillAMINE *MISCELLANEOUS THERAPEUTIC CLASSES*  Hives, Shortness of breath. Sulfa 10 *OPHTHALMIC AGENTS*  Rash. Biotin *CHEMICALS*  LevoFLOXacin *CHEMICALS*  Macrodantin *URINARY ANTI-INFECTIVES*  Latex Exam Gloves *MEDICAL DEVICES AND SUPPLIES*  Allergies Reconciled   Medication History [name] [name], RMA; 10/06/2017 2:41 PM) Amitiza (24MCG Capsule, Oral) Active. CloNIDine HCl (0.2MG  Tablet, Oral) Active. Metoprolol Tartrate (25MG  Tablet, Oral) Active. AmLODIPine Besylate (10MG  Tablet, Oral) Active. Magnesium Citrate (1.745GM/30ML Solution, Oral) Active. Medications Reconciled  Social History [name] [name], [place]; 10/06/2017 2:32 PM) Caffeine use  Coffee. No alcohol use  No drug use  Tobacco use  Never smoker.  Family History [name] [name], [place]; 10/06/2017 2:32 PM) Arthritis  Mother, Sister. Cancer  Mother. Hypertension  Mother. Respiratory Condition  Mother.  Pregnancy / Birth History [name] [name], [place]; 10/06/2017 2:32 PM) Age at menarche  44 years. Age of menopause  47-55 Gravida  20 Maternal age  62-25 Para  4  Other Problems [name] [name], [place]; 10/06/2017 2:32 PM) Colon Cancer  Heart murmur  Hemorrhoids  High blood pressure     Review of Systems [name] [name] RMA; 10/06/2017 2:32 PM) General Present- Fatigue and Weight Loss. Not Present- Appetite Loss, Chills, Fever, Night Sweats and Weight Gain. Skin Present- Rash. Not Present- Change in Wart/Mole, Dryness, Hives, Jaundice, New Lesions, Non-Healing Wounds and Ulcer. HEENT Present- Hoarseness, Seasonal Allergies, Sinus Pain and Wears glasses/contact lenses.  Not Present- Earache, Hearing Loss, Nose Bleed, Oral Ulcers, Ringing  in the Ears, Sore Throat, Visual Disturbances and Yellow Eyes. Respiratory Present- Snoring. Not Present- Bloody sputum, Chronic Cough, Difficulty Breathing and Wheezing. Breast Not Present- Breast Mass, Breast Pain, Nipple Discharge and Skin Changes. Cardiovascular Present- Swelling of Extremities. Not Present- Chest Pain, Difficulty Breathing Lying Down, Leg Cramps, Palpitations, Rapid Heart Rate and Shortness of Breath. Gastrointestinal Present- Change in Bowel Habits, Constipation and Hemorrhoids. Not Present- Abdominal Pain, Bloating, Bloody Stool, Chronic diarrhea, Difficulty Swallowing, Excessive gas, Gets full quickly at meals, Indigestion, Nausea, Rectal Pain and Vomiting. Female Genitourinary Present- Frequency, Nocturia and Urgency. Not Present- Painful Urination and Pelvic Pain. Musculoskeletal Present- Joint Stiffness. Not Present- Back Pain, Joint Pain, Muscle Pain, Muscle Weakness and Swelling of Extremities. Neurological Not Present- Decreased Memory, Fainting, Headaches, Numbness, Seizures, Tingling, Tremor, Trouble walking and Weakness. Psychiatric Not Present- Anxiety, Bipolar, Change in Sleep Pattern, Depression, Fearful and Frequent crying. Endocrine Not Present- Cold Intolerance, Excessive Hunger, Hair Changes, Heat Intolerance, Hot flashes and New Diabetes. Hematology Not Present- Blood Thinners, Easy Bruising, Excessive bleeding, Gland problems, HIV and Persistent Infections.  Vitals [name] [name] RMA; 10/06/2017 2:33 PM) 10/06/2017 2:33 PM Weight: 154.8 lb Height: 64in Body Surface Area: 1.75 m Body Mass Index: 26.57 kg/m  Temp.: 98.70F  Pulse: 89 (Regular)  BP: 155/90 (Sitting, Left Arm, Standard)       Physical Exam [name] [name] T. Aubra Pappalardo MD; 10/06/2017 3:30 PM) The physical exam findings are as follows: Note:General: Alert elderly pleasant Caucasian female, in no distress Skin: Warm  and dry without rash or infection. HEENT: No palpable masses or thyromegaly. Sclera nonicteric. Pupils equal round and reactive. Lymph nodes: No cervical, supraclavicular, or inguinal nodes palpable. Lungs: Breath sounds clear and equal. No wheezing or increased work of breathing. Cardiovascular: Regular rate and rhythm without murmer. 1-2+ lower extremity edema somewhat worse on the right. Peripheral pulses intact. No carotid bruits. Abdomen: Nondistended. Soft and nontender. No masses palpable. No organomegaly. No palpable hernias. Extremities: No joint swelling or deformity. No chronic venous stasis changes. Neurologic: Alert and fully oriented. Gait very slow but ambulates independently. Mild tremor. No focal weakness. Psychiatric: Normal mood and affect. Thought content appropriate with normal judgement and insight    Assessment & Plan [name] [name] T. Meya Clutter MD; 10/06/2017 3:33 PM) CANCER OF RIGHT COLON (C18.2) Impression: 5 cm mass in the cecum, biopsy positive for moderately differentiated adenocarcinoma. Patient is 69 but has no overwhelming medical problems. She has been battling worsening anemia for 2 years apparently secondary to her tumor. PET/CT shows no definite metastatic disease. I think she would benefit from laparoscopic hemicolectomy in terms of stopping her bleeding and need for transfusions and possibly improving her fatigue and weakness. Without surgery things will certainly get worse in short order. This was discussed with the patient and her family and they understand and agree to proceed with surgery. We discussed laparoscopic colectomy in detail including its nature and indications and expected recovery. We discussed risks of anesthetic complications, bleeding, infection, anastomotic leak and rare need for ostomy. There were given literature regarding the procedure. All her questions were answered. Current Plans Pt Education - Pamphlet Given - Laparoscopic Colorectal Surgery:  discussed with patient and provided information. Laparoscopic partial colectomy with anastomosis under general anesthesia

## 2017-11-03 NOTE — Progress Notes (Signed)
DRINK 2 PRESURGERY ENSURE DRINKS THE NIGHT BEFORE SURGERY AT  1000 PM AND 1 PRESURGERY DRINK THE DAY OF THE PROCEDURE 3 HOURS PRIOR TO SCHEDULED SURGERY. NO SOLIDS AFTER MIDNIGHT THE DAY PRIOR TO THE SURGERY. NOTHING BY MOUTH EXCEPT CLEAR LIQUIDS UNTIL THREE HOURS PRIOR TO SCHEDULED SURGERY. PLEASE FINISH PRESURGERY ENSURE DRINK PER SURGEON ORDER 3 HOURS PRIOR TO SCHEDULED SURGERY TIME WHICH NEEDS TO BE COMPLETED AT 1000 am.

## 2017-11-04 ENCOUNTER — Other Ambulatory Visit: Payer: Self-pay

## 2017-11-04 ENCOUNTER — Encounter (HOSPITAL_COMMUNITY)
Admission: RE | Disposition: A | Discharge status: Home / Self Care | Payer: Self-pay | Source: Other Acute Inpatient Hospital | Attending: General Surgery

## 2017-11-04 ENCOUNTER — Inpatient Hospital Stay (HOSPITAL_COMMUNITY): Payer: MEDICARE | Admitting: Anesthesiology

## 2017-11-04 ENCOUNTER — Inpatient Hospital Stay (HOSPITAL_COMMUNITY)
Admission: RE | Admit: 2017-11-04 | Discharge: 2017-11-07 | DRG: 330 | Disposition: A | Discharge status: Home / Self Care | Payer: MEDICARE | Source: Other Acute Inpatient Hospital | Attending: General Surgery | Admitting: General Surgery

## 2017-11-04 ENCOUNTER — Encounter (HOSPITAL_COMMUNITY): Payer: Self-pay | Admitting: Certified Registered Nurse Anesthetist

## 2017-11-04 DIAGNOSIS — C772 Secondary and unspecified malignant neoplasm of intra-abdominal lymph nodes: Secondary | ICD-10-CM | POA: Diagnosis present

## 2017-11-04 DIAGNOSIS — D509 Iron deficiency anemia, unspecified: Secondary | ICD-10-CM | POA: Diagnosis present

## 2017-11-04 DIAGNOSIS — I471 Supraventricular tachycardia: Secondary | ICD-10-CM | POA: Diagnosis present

## 2017-11-04 DIAGNOSIS — Z888 Allergy status to other drugs, medicaments and biological substances status: Secondary | ICD-10-CM | POA: Diagnosis not present

## 2017-11-04 DIAGNOSIS — K5900 Constipation, unspecified: Secondary | ICD-10-CM | POA: Diagnosis present

## 2017-11-04 DIAGNOSIS — C180 Malignant neoplasm of cecum: Secondary | ICD-10-CM | POA: Diagnosis not present

## 2017-11-04 DIAGNOSIS — Z91040 Latex allergy status: Secondary | ICD-10-CM | POA: Diagnosis not present

## 2017-11-04 DIAGNOSIS — I071 Rheumatic tricuspid insufficiency: Secondary | ICD-10-CM | POA: Diagnosis present

## 2017-11-04 DIAGNOSIS — D519 Vitamin B12 deficiency anemia, unspecified: Secondary | ICD-10-CM | POA: Diagnosis present

## 2017-11-04 DIAGNOSIS — Z886 Allergy status to analgesic agent status: Secondary | ICD-10-CM | POA: Diagnosis not present

## 2017-11-04 DIAGNOSIS — Z79899 Other long term (current) drug therapy: Secondary | ICD-10-CM | POA: Diagnosis not present

## 2017-11-04 DIAGNOSIS — I10 Essential (primary) hypertension: Secondary | ICD-10-CM | POA: Diagnosis present

## 2017-11-04 DIAGNOSIS — Z885 Allergy status to narcotic agent status: Secondary | ICD-10-CM

## 2017-11-04 DIAGNOSIS — Z880 Allergy status to penicillin: Secondary | ICD-10-CM | POA: Diagnosis not present

## 2017-11-04 DIAGNOSIS — I272 Pulmonary hypertension, unspecified: Secondary | ICD-10-CM | POA: Diagnosis present

## 2017-11-04 DIAGNOSIS — R259 Unspecified abnormal involuntary movements: Secondary | ICD-10-CM | POA: Diagnosis present

## 2017-11-04 DIAGNOSIS — Z882 Allergy status to sulfonamides status: Secondary | ICD-10-CM | POA: Diagnosis not present

## 2017-11-04 DIAGNOSIS — C182 Malignant neoplasm of ascending colon: Secondary | ICD-10-CM | POA: Diagnosis not present

## 2017-11-04 HISTORY — DX: Other specified personal risk factors, not elsewhere classified: Z91.89

## 2017-11-04 HISTORY — PX: LAPAROSCOPIC PARTIAL COLECTOMY: SHX5907

## 2017-11-04 HISTORY — DX: Pneumonia, unspecified organism: J18.9

## 2017-11-04 LAB — TYPE AND SCREEN
ABO/RH(D): O POS
Antibody Screen: NEGATIVE

## 2017-11-04 SURGERY — LAPAROSCOPIC PARTIAL COLECTOMY
Anesthesia: General

## 2017-11-04 MED ORDER — BUPIVACAINE-EPINEPHRINE (PF) 0.25% -1:200000 IJ SOLN
INTRAMUSCULAR | Status: AC
  Filled 2017-11-04: qty 30

## 2017-11-04 MED ORDER — FENTANYL CITRATE (PF) 100 MCG/2ML IJ SOLN: 25.0000 ug | INTRAMUSCULAR | Status: DC | PRN

## 2017-11-04 MED ORDER — KETAMINE HCL 50 MG/ML IJ SOLN
INTRAMUSCULAR | Status: DC | PRN
  Administered 2017-11-04: 20 mg via INTRAMUSCULAR

## 2017-11-04 MED ORDER — MORPHINE SULFATE (PF) 2 MG/ML IV SOLN: 2.0000 mg | INTRAVENOUS | Status: DC | PRN

## 2017-11-04 MED ORDER — NEOMYCIN SULFATE 500 MG PO TABS
1000.0000 mg | ORAL_TABLET | ORAL | Status: DC
  Filled 2017-11-04: qty 2

## 2017-11-04 MED ORDER — OXYCODONE HCL 5 MG PO TABS: 5.0000 mg | ORAL_TABLET | ORAL | Status: DC | PRN

## 2017-11-04 MED ORDER — CLONIDINE HCL 0.2 MG PO TABS
0.3000 mg | ORAL_TABLET | Freq: Every day | ORAL | Status: DC
  Administered 2017-11-04 – 2017-11-06 (×3): 0.3 mg via ORAL
  Filled 2017-11-04 (×3): qty 1

## 2017-11-04 MED ORDER — ALVIMOPAN 12 MG PO CAPS
12.0000 mg | ORAL_CAPSULE | ORAL | Status: AC
  Administered 2017-11-04: 12 mg via ORAL
  Filled 2017-11-04: qty 1

## 2017-11-04 MED ORDER — GABAPENTIN 300 MG PO CAPS
300.0000 mg | ORAL_CAPSULE | Freq: Two times a day (BID) | ORAL | Status: DC
  Administered 2017-11-04 – 2017-11-07 (×6): 300 mg via ORAL
  Filled 2017-11-04 (×6): qty 1

## 2017-11-04 MED ORDER — PROPOFOL 500 MG/50ML IV EMUL
INTRAVENOUS | Status: DC | PRN
  Administered 2017-11-04: 25 ug/kg/min via INTRAVENOUS

## 2017-11-04 MED ORDER — ONDANSETRON HCL 4 MG/2ML IJ SOLN
INTRAMUSCULAR | Status: AC
  Filled 2017-11-04: qty 2

## 2017-11-04 MED ORDER — AMLODIPINE BESYLATE 10 MG PO TABS
10.0000 mg | ORAL_TABLET | Freq: Every day | ORAL | Status: DC
  Administered 2017-11-05 – 2017-11-07 (×3): 10 mg via ORAL
  Filled 2017-11-04 (×4): qty 1

## 2017-11-04 MED ORDER — ACETAMINOPHEN 500 MG PO TABS
1000.0000 mg | ORAL_TABLET | ORAL | Status: AC
  Administered 2017-11-04: 1000 mg via ORAL
  Filled 2017-11-04: qty 2

## 2017-11-04 MED ORDER — SUGAMMADEX SODIUM 200 MG/2ML IV SOLN
INTRAVENOUS | Status: AC
  Filled 2017-11-04: qty 2

## 2017-11-04 MED ORDER — ALVIMOPAN 12 MG PO CAPS
12.0000 mg | ORAL_CAPSULE | Freq: Two times a day (BID) | ORAL | Status: DC
  Filled 2017-11-04 (×2): qty 1

## 2017-11-04 MED ORDER — POLYETHYLENE GLYCOL 3350 17 GM/SCOOP PO POWD
1.0000 | Freq: Once | ORAL | Status: DC
  Filled 2017-11-04: qty 255

## 2017-11-04 MED ORDER — FENTANYL CITRATE (PF) 100 MCG/2ML IJ SOLN
INTRAMUSCULAR | Status: DC | PRN
  Administered 2017-11-04 (×5): 50 ug via INTRAVENOUS

## 2017-11-04 MED ORDER — DEXAMETHASONE SODIUM PHOSPHATE 10 MG/ML IJ SOLN
INTRAMUSCULAR | Status: AC
  Filled 2017-11-04: qty 1

## 2017-11-04 MED ORDER — PROPOFOL 10 MG/ML IV BOLUS
INTRAVENOUS | Status: AC
  Filled 2017-11-04: qty 20

## 2017-11-04 MED ORDER — CLONIDINE HCL 0.2 MG PO TABS
0.2000 mg | ORAL_TABLET | Freq: Every day | ORAL | Status: DC
  Administered 2017-11-05 – 2017-11-07 (×3): 0.2 mg via ORAL
  Filled 2017-11-04 (×3): qty 1

## 2017-11-04 MED ORDER — FENTANYL CITRATE (PF) 250 MCG/5ML IJ SOLN
INTRAMUSCULAR | Status: AC
  Filled 2017-11-04: qty 5

## 2017-11-04 MED ORDER — BUPIVACAINE LIPOSOME 1.3 % IJ SUSP
INTRAMUSCULAR | Status: DC | PRN
  Administered 2017-11-04: 20 mL

## 2017-11-04 MED ORDER — ACETAMINOPHEN 325 MG PO TABS: 650.0000 mg | ORAL_TABLET | Freq: Four times a day (QID) | ORAL | Status: DC | PRN

## 2017-11-04 MED ORDER — 0.9 % SODIUM CHLORIDE (POUR BTL) OPTIME
TOPICAL | Status: DC | PRN
  Administered 2017-11-04: 2000 mL

## 2017-11-04 MED ORDER — ENOXAPARIN SODIUM 40 MG/0.4ML ~~LOC~~ SOLN
40.0000 mg | SUBCUTANEOUS | Status: DC
  Administered 2017-11-05 – 2017-11-07 (×3): 40 mg via SUBCUTANEOUS
  Filled 2017-11-04 (×3): qty 0.4

## 2017-11-04 MED ORDER — LIDOCAINE 2% (20 MG/ML) 5 ML SYRINGE
INTRAMUSCULAR | Status: DC | PRN
  Administered 2017-11-04: 4.05 mL/h via INTRAVENOUS

## 2017-11-04 MED ORDER — BUPIVACAINE-EPINEPHRINE 0.25% -1:200000 IJ SOLN
INTRAMUSCULAR | Status: DC | PRN
  Administered 2017-11-04: 30 mL

## 2017-11-04 MED ORDER — GENTAMICIN SULFATE 40 MG/ML IJ SOLN
5.0000 mg/kg | INTRAVENOUS | Status: AC
  Administered 2017-11-04: 310 mg via INTRAVENOUS
  Filled 2017-11-04: qty 7.75

## 2017-11-04 MED ORDER — ONDANSETRON HCL 4 MG/2ML IJ SOLN: 4.0000 mg | Freq: Four times a day (QID) | INTRAMUSCULAR | Status: DC | PRN

## 2017-11-04 MED ORDER — ONDANSETRON HCL 4 MG/2ML IJ SOLN
INTRAMUSCULAR | Status: DC | PRN
  Administered 2017-11-04: 4 mg via INTRAVENOUS

## 2017-11-04 MED ORDER — PROPOFOL 10 MG/ML IV BOLUS
INTRAVENOUS | Status: DC | PRN
  Administered 2017-11-04: 80 mg via INTRAVENOUS

## 2017-11-04 MED ORDER — METOPROLOL TARTRATE 12.5 MG HALF TABLET
12.5000 mg | ORAL_TABLET | Freq: Two times a day (BID) | ORAL | Status: DC
  Administered 2017-11-04 – 2017-11-07 (×6): 12.5 mg via ORAL
  Filled 2017-11-04 (×6): qty 1

## 2017-11-04 MED ORDER — LIDOCAINE HCL 2 % IJ SOLN
INTRAMUSCULAR | Status: AC
  Filled 2017-11-04: qty 20

## 2017-11-04 MED ORDER — LACTATED RINGERS IR SOLN
Status: DC | PRN
  Administered 2017-11-04: 1000 mL

## 2017-11-04 MED ORDER — METRONIDAZOLE 500 MG PO TABS
1000.0000 mg | ORAL_TABLET | ORAL | Status: DC
  Filled 2017-11-04: qty 2

## 2017-11-04 MED ORDER — SUGAMMADEX SODIUM 500 MG/5ML IV SOLN
INTRAVENOUS | Status: DC | PRN
  Administered 2017-11-04: 200 mg via INTRAVENOUS

## 2017-11-04 MED ORDER — ONDANSETRON HCL 4 MG/2ML IJ SOLN: 4.0000 mg | Freq: Once | INTRAMUSCULAR | Status: DC | PRN

## 2017-11-04 MED ORDER — CLONIDINE HCL 0.2 MG PO TABS: 0.2000 mg | ORAL_TABLET | ORAL | Status: DC

## 2017-11-04 MED ORDER — KETAMINE HCL 10 MG/ML IJ SOLN
INTRAMUSCULAR | Status: AC
  Filled 2017-11-04: qty 1

## 2017-11-04 MED ORDER — LIDOCAINE 2% (20 MG/ML) 5 ML SYRINGE
INTRAMUSCULAR | Status: DC | PRN
  Administered 2017-11-04: 40 mg via INTRAVENOUS

## 2017-11-04 MED ORDER — DEXAMETHASONE SODIUM PHOSPHATE 10 MG/ML IJ SOLN
INTRAMUSCULAR | Status: DC | PRN
  Administered 2017-11-04: 5 mg via INTRAVENOUS

## 2017-11-04 MED ORDER — CHLORHEXIDINE GLUCONATE CLOTH 2 % EX PADS: 6.0000 | MEDICATED_PAD | Freq: Once | CUTANEOUS | Status: DC

## 2017-11-04 MED ORDER — ONDANSETRON HCL 4 MG PO TABS: 4.0000 mg | ORAL_TABLET | Freq: Four times a day (QID) | ORAL | Status: DC | PRN

## 2017-11-04 MED ORDER — ROCURONIUM BROMIDE 10 MG/ML (PF) SYRINGE
PREFILLED_SYRINGE | INTRAVENOUS | Status: DC | PRN
  Administered 2017-11-04: 10 mg via INTRAVENOUS
  Administered 2017-11-04: 50 mg via INTRAVENOUS
  Administered 2017-11-04: 10 mg via INTRAVENOUS

## 2017-11-04 MED ORDER — LACTATED RINGERS IV SOLN
INTRAVENOUS | Status: DC
  Administered 2017-11-04 (×2): via INTRAVENOUS

## 2017-11-04 MED ORDER — EPHEDRINE SULFATE-NACL 50-0.9 MG/10ML-% IV SOSY
PREFILLED_SYRINGE | INTRAVENOUS | Status: DC | PRN
  Administered 2017-11-04: 15 mg via INTRAVENOUS
  Administered 2017-11-04: 10 mg via INTRAVENOUS

## 2017-11-04 MED ORDER — GABAPENTIN 300 MG PO CAPS
300.0000 mg | ORAL_CAPSULE | ORAL | Status: AC
  Administered 2017-11-04: 300 mg via ORAL
  Filled 2017-11-04: qty 1

## 2017-11-04 MED ORDER — FENTANYL CITRATE (PF) 100 MCG/2ML IJ SOLN
INTRAMUSCULAR | Status: AC
  Filled 2017-11-04: qty 2

## 2017-11-04 MED ORDER — POTASSIUM CHLORIDE IN NACL 20-0.9 MEQ/L-% IV SOLN
INTRAVENOUS | Status: DC
  Administered 2017-11-04 – 2017-11-06 (×4): via INTRAVENOUS
  Filled 2017-11-04 (×5): qty 1000

## 2017-11-04 MED ORDER — VANCOMYCIN HCL IN DEXTROSE 1-5 GM/200ML-% IV SOLN
1000.0000 mg | Freq: Once | INTRAVENOUS | Status: AC
  Administered 2017-11-04: 1000 mg via INTRAVENOUS
  Filled 2017-11-04: qty 200

## 2017-11-04 SURGICAL SUPPLY — 85 items
ADH SKN CLS APL DERMABOND .7 (GAUZE/BANDAGES/DRESSINGS)
APPLIER CLIP ROT 10 11.4 M/L (STAPLE)
APR CLP MED LRG 11.4X10 (STAPLE)
BLADE EXTENDED COATED 6.5IN (ELECTRODE) IMPLANT
CABLE HIGH FREQUENCY MONO STRZ (ELECTRODE) ×3 IMPLANT
CELLS DAT CNTRL 66122 CELL SVR (MISCELLANEOUS) ×1 IMPLANT
CHLORAPREP W/TINT 26ML (MISCELLANEOUS) ×1 IMPLANT
CLIP APPLIE ROT 10 11.4 M/L (STAPLE) IMPLANT
COUNTER NEEDLE 20 DBL MAG RED (NEEDLE) ×3 IMPLANT
COVER MAYO STAND STRL (DRAPES) ×9 IMPLANT
CUTTER FLEX LINEAR 45M (STAPLE) ×2 IMPLANT
DECANTER SPIKE VIAL GLASS SM (MISCELLANEOUS) ×3 IMPLANT
DERMABOND ADVANCED (GAUZE/BANDAGES/DRESSINGS)
DERMABOND ADVANCED .7 DNX12 (GAUZE/BANDAGES/DRESSINGS) IMPLANT
DRAIN CHANNEL 19F RND (DRAIN) IMPLANT
DRAPE LAPAROSCOPIC ABDOMINAL (DRAPES) ×3 IMPLANT
DRAPE SURG IRRIG POUCH 19X23 (DRAPES) ×1 IMPLANT
DRAPE UTILITY XL STRL (DRAPES) ×2 IMPLANT
DRSG OPSITE POSTOP 4X10 (GAUZE/BANDAGES/DRESSINGS) IMPLANT
DRSG OPSITE POSTOP 4X6 (GAUZE/BANDAGES/DRESSINGS) ×2 IMPLANT
DRSG OPSITE POSTOP 4X8 (GAUZE/BANDAGES/DRESSINGS) IMPLANT
DRSG TEGADERM 2-3/8X2-3/4 SM (GAUZE/BANDAGES/DRESSINGS) ×6 IMPLANT
ELECT PENCIL ROCKER SW 15FT (MISCELLANEOUS) ×6 IMPLANT
ELECT REM PT RETURN 15FT ADLT (MISCELLANEOUS) ×3 IMPLANT
EVACUATOR SILICONE 100CC (DRAIN) IMPLANT
GAUZE SPONGE 2X2 8PLY STRL LF (GAUZE/BANDAGES/DRESSINGS) IMPLANT
GAUZE SPONGE 4X4 12PLY STRL (GAUZE/BANDAGES/DRESSINGS) IMPLANT
GLOVE BIOGEL PI IND STRL 6.5 (GLOVE) IMPLANT
GLOVE BIOGEL PI IND STRL 7.0 (GLOVE) IMPLANT
GLOVE BIOGEL PI IND STRL 7.5 (GLOVE) ×2 IMPLANT
GLOVE BIOGEL PI INDICATOR 6.5 (GLOVE) ×6
GLOVE BIOGEL PI INDICATOR 7.0 (GLOVE) ×6
GLOVE BIOGEL PI INDICATOR 7.5 (GLOVE) ×4
GLOVE ECLIPSE 7.5 STRL STRAW (GLOVE) ×6 IMPLANT
GLOVE SURG SS PI 6.5 STRL IVOR (GLOVE) ×12 IMPLANT
GLOVE SURG SS PI 7.0 STRL IVOR (GLOVE) ×12 IMPLANT
GOWN STRL REUS W/TWL XL LVL3 (GOWN DISPOSABLE) ×20 IMPLANT
HOLDER FOLEY CATH W/STRAP (MISCELLANEOUS) ×2 IMPLANT
LUBRICANT JELLY K Y 4OZ (MISCELLANEOUS) IMPLANT
PACK COLON (CUSTOM PROCEDURE TRAY) ×3 IMPLANT
PAD POSITIONING PINK XL (MISCELLANEOUS) ×3 IMPLANT
PORT LAP GEL ALEXIS MED 5-9CM (MISCELLANEOUS) IMPLANT
POSITIONER SURGICAL ARM (MISCELLANEOUS) IMPLANT
RELOAD 45 VASCULAR/THIN (ENDOMECHANICALS) ×3 IMPLANT
RELOAD BLUE CHELON 45 (STAPLE) IMPLANT
RELOAD PROXIMATE 75MM BLUE (ENDOMECHANICALS) ×6 IMPLANT
RELOAD STAPLE 45 2.5 WHT GRN (ENDOMECHANICALS) IMPLANT
RELOAD STAPLE 75 3.8 BLU REG (ENDOMECHANICALS) IMPLANT
RETRACTOR WND ALEXIS 18 MED (MISCELLANEOUS) IMPLANT
RTRCTR WOUND ALEXIS 18CM MED (MISCELLANEOUS) ×3
SCISSORS LAP 5X35 DISP (ENDOMECHANICALS) ×3 IMPLANT
SET IRRIG TUBING LAPAROSCOPIC (IRRIGATION / IRRIGATOR) ×3 IMPLANT
SHEARS HARMONIC ACE PLUS 36CM (ENDOMECHANICALS) ×3 IMPLANT
SLEEVE ADV FIXATION 5X100MM (TROCAR) ×6 IMPLANT
SPONGE DRAIN TRACH 4X4 STRL 2S (GAUZE/BANDAGES/DRESSINGS) IMPLANT
SPONGE GAUZE 2X2 STER 10/PKG (GAUZE/BANDAGES/DRESSINGS) ×2
STAPLER GUN LINEAR PROX 60 (STAPLE) ×2 IMPLANT
STAPLER PROXIMATE 75MM BLUE (STAPLE) ×2 IMPLANT
STAPLER VISISTAT 35W (STAPLE) ×5 IMPLANT
SUT ETHILON 3 0 PS 1 (SUTURE) IMPLANT
SUT PDS AB 0 CT1 36 (SUTURE) ×4 IMPLANT
SUT PDS AB 1 CTX 36 (SUTURE) IMPLANT
SUT PDS AB 1 TP1 96 (SUTURE) IMPLANT
SUT PROLENE 2 0 KS (SUTURE) IMPLANT
SUT PROLENE 3 0 SH 48 (SUTURE) IMPLANT
SUT SILK 2 0 (SUTURE) ×3
SUT SILK 2 0 SH CR/8 (SUTURE) ×3 IMPLANT
SUT SILK 2-0 18XBRD TIE 12 (SUTURE) ×1 IMPLANT
SUT SILK 3 0 (SUTURE) ×3
SUT SILK 3 0 SH CR/8 (SUTURE) ×3 IMPLANT
SUT SILK 3-0 18XBRD TIE 12 (SUTURE) ×1 IMPLANT
SUT VICRYL 0 UR6 27IN ABS (SUTURE) ×2 IMPLANT
SYS LAPSCP GELPORT 120MM (MISCELLANEOUS)
SYSTEM LAPSCP GELPORT 120MM (MISCELLANEOUS) IMPLANT
TAPE CLOTH 4X10 WHT NS (GAUZE/BANDAGES/DRESSINGS) IMPLANT
TOWEL OR 17X26 10 PK STRL BLUE (TOWEL DISPOSABLE) IMPLANT
TOWEL OR NON WOVEN STRL DISP B (DISPOSABLE) ×3 IMPLANT
TRAY FOLEY MTR SLVR 16FR STAT (SET/KITS/TRAYS/PACK) ×3 IMPLANT
TROCAR ADV FIXATION 12X100MM (TROCAR) IMPLANT
TROCAR ADV FIXATION 5X100MM (TROCAR) ×3 IMPLANT
TROCAR BLADELESS OPT 5 100 (ENDOMECHANICALS) ×3 IMPLANT
TROCAR XCEL BLUNT TIP 100MML (ENDOMECHANICALS) ×2 IMPLANT
TUBING CONNECTING 10 (TUBING) ×1 IMPLANT
TUBING CONNECTING 10' (TUBING) ×1
TUBING INSUF HEATED (TUBING) ×3 IMPLANT

## 2017-11-04 NOTE — Op Note (Signed)
Preoperative Diagnosis: CANCER CECUM  Postoprative Diagnosis: CANCER CECUM  Procedure: Procedure(s): LAPAROSCOPIC right hemicolectomy   Surgeon: Excell Seltzer T   Assistants: Coralie Keens  Anesthesia:  General endotracheal anesthesia  Indications: 5 cm mass in the cecum, biopsy positive for moderately differentiated adenocarcinoma. Patient is 80 but has no overwhelming medical problems. She has been battling worsening anemia for 2 years apparently secondary to her tumor. PET/CT shows no definite metastatic disease.  After preoperative discussion with the patient and her family regarding benefits of surgery and risks we have elected to proceed with laparoscopic right hemicolectomy.    Procedure Detail: Patient had undergone a mechanical and antibiotic bowel prep at home.  She was brought to the operating room, placed in the supine position on the operating table, and general endotracheal anesthesia induced.  PAS were in place.  She received preoperative IV antibiotics.  The abdomen was widely sterilely prepped and draped.  Patient timeout was performed and correct procedure verified.  Access was obtained with a 1 cm incision below the umbilicus with an open Hassan technique and pneumoperitoneum established.  Under direct vision 5 mm trochars were placed just above and medial to the left iliac crest and in the left mid abdomen as well as in the right mid abdomen.  There were noted to be some significant chronic adhesions of the mid right colon to the anterior abdominal wall.  These were filmy.  There was a mass that was discernible laparoscopically in the cecum but no evidence of growth through the serosal surface.  No evidence of metastatic disease.  Adhesions to the anterior lateral abdominal wall of the mid right colon were taken down sharply.  The the right colon was then extensively mobilized dividing lateral peritoneal attachments mobilizing the mesentery up out of the retroperitoneum.   The hepatic flexure was fully mobilized.  The gastrocolic omentum was divided near the midline and working back over toward the hepatic flexure the hepatocolic ligament was fully divided and the proximal transverse colon hepatic flexure were fully mobilized.  The terminal ileum and cecum were already extremely mobile.  Lifting the right colon the ileocolic pedicle was easily identified.  The mesentery was opened in the avascular space and the ileocolic pedicle encircled and clearly identified.  It was divided near its base with the vascular GIA 45 mm stapler.  At this point the small incision below the umbilicus was extended for a few centimeters superiorly around the umbilicus and wound protector placed.  The terminal ileum and right colon were then exteriorized.  There was a fairly bulky mass in the cecum.  There was plenty of mobilization.  Points of resection at the proximal transverse colon and terminal ileum were chosen.  These areas were cleared of mesentery divided with the GIA 75 mm stapler.  The remaining mesentery of the terminal ileum and right colon were divided between clamps and tied with 2-0 silk ties and the specimen removed.  A functional end-to-end anastomosis was then created between the terminal ileum and the transverse colon with a single firing of the GIA 75 mm stapler.  The staple line was intact and without bleeding.  The common enterotomy was closed with a single firing of the TA 60 stapler.  The crotch of the anastomosis was reinforced with 2-0 silk.  The viscera were returned to their anatomic position.  The abdomen was inspected and there was no evidence of bleeding or injury or other problems.  The wound protector and all close counts drapes  and instruments were changed.  Dilute Exparel was used to infiltrate the preperitoneal space surrounding the small midline incision.  The midline fascia was closed using running 0 PDS begun at either end of the incision and tied centrally.  Wounds  were irrigated and closed with staples.  Sponge needle instrument counts were correct.  Dry sterile dressings applied.    Findings: As above  Estimated Blood Loss:  less than 50 mL         Drains: None  Blood Given: none          Specimens: Terminal ileum and right colon        Complications:  * No complications entered in OR log *         Disposition: PACU - hemodynamically stable.         Condition: stable

## 2017-11-04 NOTE — Interval H&P Note (Signed)
History and Physical Interval Note:  11/04/2017 1:30 PM  [name] [name]  has presented today for surgery, with the diagnosis of CANCER CECUM  The various methods of treatment have been discussed with the patient and family. After consideration of risks, benefits and other options for treatment, the patient has consented to  Procedure(s): LAPAROSCOPIC PARTIAL COLECTOMY (N/A) as a surgical intervention .  The patient's history has been reviewed, patient examined, no change in status, stable for surgery.  I have reviewed the patient's chart and labs.  Questions were answered to the patient's satisfaction.     Darene Lamer Gerren Hoffmeier

## 2017-11-04 NOTE — Anesthesia Procedure Notes (Signed)
Procedure Name: Intubation Performed by: Gean Maidens, CRNA Pre-anesthesia Checklist: Patient identified, Emergency Drugs available, Suction available, Patient being monitored and Timeout performed Patient Re-evaluated:Patient Re-evaluated prior to induction Oxygen Delivery Method: Circle system utilized Preoxygenation: Pre-oxygenation with 100% oxygen Induction Type: IV induction Ventilation: Mask ventilation without difficulty Laryngoscope Size: Mac and 3 Grade View: Grade I Tube type: Oral Tube size: 7.0 mm Number of attempts: 1 Airway Equipment and Method: Stylet Placement Confirmation: ETT inserted through vocal cords under direct vision,  positive ETCO2 and breath sounds checked- equal and bilateral Secured at: 21 cm Tube secured with: Tape Dental Injury: Teeth and Oropharynx as per pre-operative assessment

## 2017-11-04 NOTE — Anesthesia Preprocedure Evaluation (Addendum)
Anesthesia Evaluation  Patient identified by MRN, date of birth, ID band Patient awake    Reviewed: Allergy & Precautions, NPO status , Patient's Chart, lab work & pertinent test results  Airway Mallampati: II  TM Distance: >3 FB Neck ROM: Full    Dental  (+) Dental Advisory Given   Pulmonary neg pulmonary ROS,    breath sounds clear to auscultation       Cardiovascular hypertension, Pt. on medications and Pt. on home beta blockers  Rhythm:Regular Rate:Normal  Normal LV systolic function; elevated LV filling pressure;  biatrial enlargement; mild RVE; mild TR with moderate pulmonary  hypertension.   Neuro/Psych negative neurological ROS     GI/Hepatic negative GI ROS, Neg liver ROS,   Endo/Other  negative endocrine ROS  Renal/GU negative Renal ROS     Musculoskeletal   Abdominal   Peds  Hematology  (+) anemia ,   Anesthesia Other Findings   Reproductive/Obstetrics                            Lab Results  Component Value Date   WBC 7.3 10/29/2017   HGB 10.3 (L) 10/29/2017   HCT 34.2 (L) 10/29/2017   MCV 91.4 10/29/2017   PLT 507 (H) 10/29/2017   Lab Results  Component Value Date   CREATININE 0.66 10/14/2017   BUN 12 10/14/2017   NA 139 10/14/2017   K 4.4 10/14/2017   CL 108 10/14/2017   CO2 24 10/14/2017    Anesthesia Physical Anesthesia Plan  ASA: III  Anesthesia Plan: General   Post-op Pain Management:    Induction: Intravenous  PONV Risk Score and Plan: 4 or greater and Dexamethasone, Ondansetron, Treatment may vary due to age or medical condition and Propofol infusion  Airway Management Planned: Oral ETT  Additional Equipment:   Intra-op Plan:   Post-operative Plan: Extubation in OR  Informed Consent: I have reviewed the patients History and Physical, chart, labs and discussed the procedure including the risks, benefits and alternatives for the proposed  anesthesia with the patient or authorized representative who has indicated his/her understanding and acceptance.   Dental advisory given  Plan Discussed with: CRNA  Anesthesia Plan Comments:        Anesthesia Quick Evaluation

## 2017-11-04 NOTE — Transfer of Care (Signed)
Immediate Anesthesia Transfer of Care Note  Patient: [name] [name]  Procedure(s) Performed: LAPAROSCOPIC RIGHT HEMICOLECTOMY (N/A )  Patient Location: PACU  Anesthesia Type:General  Level of Consciousness: awake, alert , oriented and patient cooperative  Airway & Oxygen Therapy: Patient Spontanous Breathing and Patient connected to face mask oxygen  Post-op Assessment: Report given to RN and Post -op Vital signs reviewed and stable  Post vital signs: Reviewed and stable  Last Vitals:  Vitals Value Taken Time  BP 140/47 11/04/2017  4:00 PM  Temp    Pulse 88 11/04/2017  4:04 PM  Resp 16 11/04/2017  4:04 PM  SpO2 100 % 11/04/2017  4:04 PM  Vitals shown include unvalidated device data.  Last Pain:  Vitals:   11/04/17 1125  TempSrc: Oral      Patients Stated Pain Goal: 4 (39/17/92 1783)  Complications: No apparent anesthesia complications

## 2017-11-05 ENCOUNTER — Encounter (HOSPITAL_COMMUNITY): Payer: Self-pay | Admitting: General Surgery

## 2017-11-05 LAB — CBC
HEMATOCRIT: 36.3 % (ref 36.0–46.0)
HEMOGLOBIN: 11.3 g/dL — AB (ref 12.0–15.0)
MCH: 28.6 pg (ref 26.0–34.0)
MCHC: 31.1 g/dL (ref 30.0–36.0)
MCV: 91.9 fL (ref 78.0–100.0)
Platelets: 438 10*3/uL — ABNORMAL HIGH (ref 150–400)
RBC: 3.95 MIL/uL (ref 3.87–5.11)
RDW: 18.4 % — ABNORMAL HIGH (ref 11.5–15.5)
WBC: 8.8 10*3/uL (ref 4.0–10.5)

## 2017-11-05 LAB — BASIC METABOLIC PANEL
ANION GAP: 7 (ref 5–15)
BUN: 5 mg/dL — ABNORMAL LOW (ref 8–23)
CHLORIDE: 109 mmol/L (ref 98–111)
CO2: 24 mmol/L (ref 22–32)
CREATININE: 0.43 mg/dL — AB (ref 0.44–1.00)
Calcium: 7.9 mg/dL — ABNORMAL LOW (ref 8.9–10.3)
GFR calc non Af Amer: >60 mL/min (ref >60)
GLUCOSE: 135 mg/dL — AB (ref 70–99)
Potassium: 4 mmol/L (ref 3.5–5.1)
Sodium: 140 mmol/L (ref 135–145)

## 2017-11-05 NOTE — Anesthesia Postprocedure Evaluation (Signed)
Anesthesia Post Note  Patient: [name] [name]  Procedure(s) Performed: LAPAROSCOPIC RIGHT HEMICOLECTOMY (N/A )     Patient location during evaluation: PACU Anesthesia Type: General Level of consciousness: awake and alert Pain management: pain level controlled Vital Signs Assessment: post-procedure vital signs reviewed and stable Respiratory status: spontaneous breathing, nonlabored ventilation, respiratory function stable and patient connected to nasal cannula oxygen Cardiovascular status: blood pressure returned to baseline and stable Postop Assessment: no apparent nausea or vomiting Anesthetic complications: no    Last Vitals:  Vitals:   11/05/17 0621 11/05/17 1048  BP: 126/71 134/72  Pulse: 79 78  Resp: 20 17  Temp: 36.4 C 36.5 C  SpO2:  99%    Last Pain:  Vitals:   11/05/17 1048  TempSrc: Oral  PainSc:                  [name]

## 2017-11-05 NOTE — Progress Notes (Signed)
Patient ID: [name] [name], female   DOB: 06-Sep-1927, 84 y.o.   MRN: [id number] 1 Day Post-Op   Subjective: Doing well this morning.  Rested last night.  Denies pain or nausea.  Sipping on clear liquids.  Objective: Vital signs in last 24 hours: Temp:  [97.3 F (36.3 C)-98.7 F (37.1 C)] 97.6 F (36.4 C) (09/26 0621) Pulse Rate:  [36-98] 79 (09/26 0621) Resp:  [15-20] 20 (09/26 0621) BP: (126-151)/(47-93) 126/71 (09/26 0621) SpO2:  [94 %-100 %] 98 % (09/26 0011) Weight:  [72.1 kg] 72.1 kg (09/25 1125) Last BM Date: 11/04/17  Intake/Output from previous day: 09/25 0701 - 09/26 0700 In: 2679.5 [P.O.:240; I.V.:1522.8; IV Piggyback:916.7] Out: 1750 [Urine:1725; Blood:25] Intake/Output this shift: No intake/output data recorded.  General appearance: alert, cooperative and no distress Resp: No wheezing or increased work of breathing GI: normal findings: soft, non-tender Incision/Wound: Clean and dry  Lab Results:  Recent Labs    11/05/17 0429  WBC 8.8  HGB 11.3*  HCT 36.3  PLT 438*   BMET Recent Labs    11/05/17 0429  NA 140  K 4.0  CL 109  CO2 24  GLUCOSE 135*  BUN 5*  CREATININE 0.43*  CALCIUM 7.9*     Studies/Results: No results found.  Anti-infectives: Anti-infectives (From admission, onward)   Start     Dose/Rate Route Frequency Ordered Stop   11/04/17 1400  neomycin (MYCIFRADIN) tablet 1,000 mg  Status:  Discontinued     1,000 mg Oral 3 times per day 11/04/17 1058 11/04/17 1651   11/04/17 1400  metroNIDAZOLE (FLAGYL) tablet 1,000 mg  Status:  Discontinued     1,000 mg Oral 3 times per day 11/04/17 1058 11/04/17 1651   11/04/17 1230  vancomycin (VANCOCIN) IVPB 1000 mg/200 mL premix     1,000 mg 200 mL/hr over 60 Minutes Intravenous  Once 11/04/17 1219 11/04/17 1355   11/04/17 1230  gentamicin (GARAMYCIN) 310 mg in dextrose 5 % 100 mL IVPB     5 mg/kg  61.7 kg (Adjusted) 107.8 mL/hr over 60 Minutes Intravenous On call 11/04/17 1222 11/04/17 1426      Assessment/Plan: s/p Procedure(s): LAPAROSCOPIC RIGHT HEMICOLECTOMY Doing very well postoperatively.  Continue clear liquids today.  Up out of bed.   LOS: 1 day    Edward Jolly 11/05/2017

## 2017-11-06 LAB — CBC
HCT: 31.9 % — ABNORMAL LOW (ref 36.0–46.0)
HEMOGLOBIN: 9.6 g/dL — AB (ref 12.0–15.0)
MCH: 28.6 pg (ref 26.0–34.0)
MCHC: 30.1 g/dL (ref 30.0–36.0)
MCV: 94.9 fL (ref 78.0–100.0)
PLATELETS: 369 10*3/uL (ref 150–400)
RBC: 3.36 MIL/uL — AB (ref 3.87–5.11)
RDW: 18.9 % — ABNORMAL HIGH (ref 11.5–15.5)
WBC: 5.9 10*3/uL (ref 4.0–10.5)

## 2017-11-06 LAB — BASIC METABOLIC PANEL
Anion gap: 5 (ref 5–15)
BUN: 6 mg/dL — ABNORMAL LOW (ref 8–23)
CHLORIDE: 113 mmol/L — AB (ref 98–111)
CO2: 22 mmol/L (ref 22–32)
CREATININE: 0.55 mg/dL (ref 0.44–1.00)
Calcium: 7.7 mg/dL — ABNORMAL LOW (ref 8.9–10.3)
Glucose, Bld: 91 mg/dL (ref 70–99)
POTASSIUM: 4.4 mmol/L (ref 3.5–5.1)
Sodium: 140 mmol/L (ref 135–145)

## 2017-11-06 NOTE — Progress Notes (Signed)
ERAS education reinforced. Did patient attend class prior to procedure? Yes [  x ] No [   ] Discussed: Pain Control [ x  ] Mobility [  x ] Diet [ x  ] Other [   ] Sitting in chair. Discussed ERAS and answered questions. Encouraged out of bed for 6 hours today. Pecolia Ades, RN, BSN Quality Program Coordinator, Enhanced Recovery after Surgery 11/06/17 9:42 AM

## 2017-11-06 NOTE — Progress Notes (Signed)
Patient ID: [name] [name], female   DOB: November 27, 1927, 84 y.o.   MRN: [id number] 2 Days Post-Op   Subjective: Feeling well.  Some soreness at her incision but no pain.  Up out of bed.  Has had a couple of bowel movements.  Denies nausea and tolerating clear liquids well.  Objective: Vital signs in last 24 hours: Temp:  [97.4 F (36.3 C)-98.3 F (36.8 C)] 97.4 F (36.3 C) (09/27 0503) Pulse Rate:  [49-83] 74 (09/27 0503) Resp:  [16-17] 16 (09/27 0503) BP: (111-142)/(62-77) 118/75 (09/27 0503) SpO2:  [95 %-100 %] 95 % (09/27 0503) Last BM Date: 11/04/17  Intake/Output from previous day: 09/26 0701 - 09/27 0700 In: 2330.7 [I.V.:2330.7] Out: 2000 [Urine:2000] Intake/Output this shift: Total I/O In: 120 [P.O.:120] Out: -   General appearance: alert, cooperative and no distress GI: normal findings: soft, non-tender and Nondistended Incision/Wound: No erythema or drainage  Lab Results:  Recent Labs    11/05/17 0429 11/06/17 0353  WBC 8.8 5.9  HGB 11.3* 9.6*  HCT 36.3 31.9*  PLT 438* 369   BMET Recent Labs    11/05/17 0429 11/06/17 0353  NA 140 140  K 4.0 4.4  CL 109 113*  CO2 24 22  GLUCOSE 135* 91  BUN 5* 6*  CREATININE 0.43* 0.55  CALCIUM 7.9* 7.7*     Studies/Results: No results found.  Anti-infectives: Anti-infectives (From admission, onward)   Start     Dose/Rate Route Frequency Ordered Stop   11/04/17 1400  neomycin (MYCIFRADIN) tablet 1,000 mg  Status:  Discontinued     1,000 mg Oral 3 times per day 11/04/17 1058 11/04/17 1651   11/04/17 1400  metroNIDAZOLE (FLAGYL) tablet 1,000 mg  Status:  Discontinued     1,000 mg Oral 3 times per day 11/04/17 1058 11/04/17 1651   11/04/17 1230  vancomycin (VANCOCIN) IVPB 1000 mg/200 mL premix     1,000 mg 200 mL/hr over 60 Minutes Intravenous  Once 11/04/17 1219 11/04/17 1355   11/04/17 1230  gentamicin (GARAMYCIN) 310 mg in dextrose 5 % 100 mL IVPB     5 mg/kg  61.7 kg (Adjusted) 107.8 mL/hr over 60 Minutes  Intravenous On call 11/04/17 1222 11/04/17 1426      Assessment/Plan: s/p Procedure(s): LAPAROSCOPIC RIGHT HEMICOLECTOMY Doing remarkably well.  No evidence of complication. Advance diet as tolerated.  Possible discharge over the weekend.   LOS: 2 days    Edward Jolly 11/06/2017

## 2017-11-07 ENCOUNTER — Encounter (HOSPITAL_COMMUNITY): Payer: Self-pay | Admitting: Surgery

## 2017-11-07 LAB — CBC
HCT: 32.4 % — ABNORMAL LOW (ref 36.0–46.0)
Hemoglobin: 10 g/dL — ABNORMAL LOW (ref 12.0–15.0)
MCH: 28.9 pg (ref 26.0–34.0)
MCHC: 30.9 g/dL (ref 30.0–36.0)
MCV: 93.6 fL (ref 78.0–100.0)
Platelets: 362 10*3/uL (ref 150–400)
RBC: 3.46 MIL/uL — ABNORMAL LOW (ref 3.87–5.11)
RDW: 18.8 % — ABNORMAL HIGH (ref 11.5–15.5)
WBC: 5.1 10*3/uL (ref 4.0–10.5)

## 2017-11-07 MED ORDER — FLUCONAZOLE 100 MG PO TABS: 200.0000 mg | ORAL_TABLET | Freq: Once | ORAL | Status: DC

## 2017-11-07 MED ORDER — NYSTATIN 100000 UNIT/GM EX POWD: Freq: Three times a day (TID) | CUTANEOUS | 2 refills | Status: DC

## 2017-11-07 MED ORDER — FLUCONAZOLE 100 MG PO TABS: 200.0000 mg | ORAL_TABLET | Freq: Every day | ORAL | Status: DC

## 2017-11-07 MED ORDER — NYSTATIN 100000 UNIT/GM EX POWD: Freq: Three times a day (TID) | CUTANEOUS | Status: DC

## 2017-11-07 MED ORDER — GABAPENTIN 300 MG PO CAPS: 300.0000 mg | ORAL_CAPSULE | Freq: Three times a day (TID) | ORAL | 1 refills | Status: DC | PRN

## 2017-11-07 NOTE — Discharge Instructions (Signed)
SURGERY: POST OP INSTRUCTIONS (Surgery for small bowel obstruction, colon resection, etc)   ######################################################################  EAT Gradually transition to a high fiber diet with a fiber supplement over the next few days after discharge  WALK Walk an hour a day.  Control your pain to do that.    CONTROL PAIN Control pain so that you can walk, sleep, tolerate sneezing/coughing, go up/down stairs.  HAVE A BOWEL MOVEMENT DAILY Keep your bowels regular to avoid problems.  OK to try a laxative to override constipation.  OK to use an antidairrheal to slow down diarrhea.  Call if not better after 2 tries  CALL IF YOU HAVE PROBLEMS/CONCERNS Call if you are still struggling despite following these instructions. Call if you have concerns not answered by these instructions  ######################################################################   DIET Follow a light diet the first few days at home.  Start with a bland diet such as soups, liquids, starchy foods, low fat foods, etc.  If you feel full, bloated, or constipated, stay on a ful liquid or pureed/blenderized diet for a few days until you feel better and no longer constipated. Be sure to drink plenty of fluids every day to avoid getting dehydrated (feeling dizzy, not urinating, etc.). Gradually add a fiber supplement to your diet over the next week.  Gradually get back to a regular solid diet.  Avoid fast food or heavy meals the first week as you are more likely to get nauseated. It is expected for your digestive tract to need a few months to get back to normal.  It is common for your bowel movements and stools to be irregular.  You will have occasional bloating and cramping that should eventually fade away.  Until you are eating solid food normally, off all pain medications, and back to regular activities; your bowels will not be normal. Focus on eating a low-fat, high fiber diet the rest of your life  (See Getting to [place], below).  CARE of your INCISION or WOUND It is good for closed incision and even open wounds to be washed every day.  Shower every day.  Short baths are fine.  Wash the incisions and wounds clean with soap & water.    If you have a closed incision(s), wash the incision with soap & water every day.  You may leave closed incisions open to air if it is dry.   You may cover the incision with clean gauze & replace it after your daily shower for comfort.  You have staples, set up an appointment for them to be removed in the office in 10 days after surgery.    ACTIVITIES as tolerated Start light daily activities --- self-care, walking, climbing stairs-- beginning the day after surgery.  Gradually increase activities as tolerated.  Control your pain to be active.  Stop when you are tired.  Ideally, walk several times a day, eventually an hour a day.   Most people are back to most day-to-day activities in a few weeks.  It takes 4-8 weeks to get back to unrestricted, intense activity. If you can walk 30 minutes without difficulty, it is safe to try more intense activity such as jogging, treadmill, bicycling, low-impact aerobics, swimming, etc. Save the most intensive and strenuous activity for last (Usually 4-8 weeks after surgery) such as sit-ups, heavy lifting, contact sports, etc.  Refrain from any intense heavy lifting or straining until you are off narcotics for pain control.  You will have off days, but things should improve  week-by-week. DO NOT PUSH THROUGH PAIN.  Let pain be your guide: If it hurts to do something, don't do it.  Pain is your body warning you to avoid that activity for another week until the pain goes down. You may drive when you are no longer taking narcotic prescription pain medication, you can comfortably wear a seatbelt, and you can safely make sudden turns/stops to protect yourself without hesitating due to pain. You may have sexual intercourse  when it is comfortable. If it hurts to do something, stop.  MEDICATIONS Take your usually prescribed home medications unless otherwise directed.   Blood thinners:  Usually you can restart any strong blood thinners after the second postoperative day.  It is OK to take aspirin right away.     If you are on strong blood thinners (warfarin/Coumadin, Plavix, Xerelto, Eliquis, Pradaxa, etc), discuss with your surgeon, medicine PCP, and/or cardiologist for instructions on when to restart the blood thinner & if blood monitoring is needed (PT/INR blood check, etc).     PAIN CONTROL Pain after surgery or related to activity is often due to strain/injury to muscle, tendon, nerves and/or incisions.  This pain is usually short-term and will improve in a few months.  To help speed the process of healing and to get back to regular activity more quickly, DO THE FOLLOWING THINGS TOGETHER: 1. Increase activity gradually.  DO NOT PUSH THROUGH PAIN 2. Use Ice and/or Heat 3. Try Gentle Massage and/or Stretching 4. Take over the counter pain medication 5. Take Narcotic prescription pain medication for more severe pain  Good pain control = faster recovery.  It is better to take more medicine to be more active than to stay in bed all day to avoid medications. 1.  Increase activity gradually Avoid heavy lifting at first, then increase to lifting as tolerated over the next 6 weeks. Do not push through the pain.  Listen to your body and avoid positions and maneuvers than reproduce the pain.  Wait a few days before trying something more intense Walking an hour a day is encouraged to help your body recover faster and more safely.  Start slowly and stop when getting sore.  If you can walk 30 minutes without stopping or pain, you can try more intense activity (running, jogging, aerobics, cycling, swimming, treadmill, sex, sports, weightlifting, etc.) Remember: If it hurts to do it, then dont do it! 2. Use Ice and/or  Heat You will have swelling and bruising around the incisions.  This will take several weeks to resolve. Ice packs or heating pads (6-8 times a day, 30-60 minutes at a time) will help sooth soreness & bruising. Some people prefer to use ice alone, heat alone, or alternate between ice & heat.  Experiment and see what works best for you.  Consider trying ice for the first few days to help decrease swelling and bruising; then, switch to heat to help relax sore spots and speed recovery. Shower every day.  Short baths are fine.  It feels good!  Keep the incisions and wounds clean with soap & water.   3. Try Gentle Massage and/or Stretching Massage at the area of pain many times a day Stop if you feel pain - do not overdo it 4. Take over the counter pain medication This helps the muscle and nerve tissues become less irritable and calm down faster Choose ONE of the following over-the-counter anti-inflammatory medications: Acetaminophen 500mg  tabs (Tylenol) 1-2 pills with every meal and just before bedtime (avoid  if you have liver problems or if you have acetaminophen in you narcotic prescription) Naproxen 220mg  tabs (ex. Aleve, Naprosyn) 1-2 pills twice a day (avoid if you have kidney, stomach, IBD, or bleeding problems) Ibuprofen 200mg  tabs (ex. Advil, Motrin) 3-4 pills with every meal and just before bedtime (avoid if you have kidney, stomach, IBD, or bleeding problems) Take with food/snack several times a day as directed for at least 2 weeks to help keep pain / soreness down & more manageable. 5. Take Narcotic prescription pain medication for more severe pain A prescription for strong pain control is often given to you upon discharge (for example: oxycodone/Percocet, hydrocodone/Norco/Vicodin, or tramadol/Ultram) Take your pain medication as prescribed. Be mindful that most narcotic prescriptions contain Tylenol (acetaminophen) as well - avoid taking too much Tylenol. If you are having  problems/concerns with the prescription medicine (does not control pain, nausea, vomiting, rash, itching, etc.), please call us [phone] to see if we need to switch you to a different pain medicine that will work better for you and/or control your side effects better. If you need a refill on your pain medication, you must call the office before 4 pm and on weekdays only.  By federal law, prescriptions for narcotics cannot be called into a pharmacy.  They must be filled out on paper & picked up from our office by the patient or authorized caretaker.  Prescriptions cannot be filled after 4 pm nor on weekends.    WHEN TO CALL us [phone] Severe uncontrolled or worsening pain  Fever over 101 F (38.5 C) Concerns with the incision: Worsening pain, redness, rash/hives, swelling, bleeding, or drainage Reactions / problems with new medications (itching, rash, hives, nausea, etc.) Nausea and/or vomiting Difficulty urinating Difficulty breathing Worsening fatigue, dizziness, lightheadedness, blurred vision Other concerns If you are not getting better after two weeks or are noticing you are getting worse, contact our office [phone] for further advice.  We may need to adjust your medications, re-evaluate you in the office, send you to the emergency room, or see what other things we can do to help. The clinic staff is available to answer your questions during regular business hours (8:30am-5pm).  Please dont hesitate to call and ask to speak to one of our nurses for clinical concerns.    A surgeon from [facility] is always on call at the hospitals 24 hours/day If you have a medical emergency, go to the nearest emergency room or call 911.  FOLLOW UP in our office One the day of your discharge from the hospital (or the next business weekday), please call [facility] to set up or confirm an appointment to see your surgeon in the office for a follow-up appointment.   Usually it is 2-3 weeks after your surgery.   If you have skin staples at your incision(s), let the office know so we can set up a time in the office for the nurse to remove them (usually around 10 days after surgery). Make sure that you call for appointments the day of discharge (or the next business weekday) from the hospital to ensure a convenient appointment time. IF YOU HAVE DISABILITY OR FAMILY LEAVE FORMS, BRING THEM TO THE OFFICE FOR PROCESSING.  DO NOT GIVE THEM TO YOUR DOCTOR.  [facility] [address] [place], [place], [place]  [address] ? [phone] - Main [phone] - [place],  [phone] - Fax [email or web address]  GETTING TO GOOD BOWEL HEALTH.  It is expected for your digestive tract to need a few months to get back to normal.  It is common for your bowel movements and stools to be irregular.  You will have occasional bloating and cramping that should eventually fade away.  Until you are eating solid food normally, off all pain medications, and back to regular activities; your bowels will not be normal.   Avoiding constipation The goal: ONE SOFT BOWEL MOVEMENT A DAY!    Drink plenty of fluids.  Choose water first. TAKE A FIBER SUPPLEMENT EVERY DAY THE REST OF YOUR LIFE During your first week back home, gradually add back a fiber supplement every day Experiment which form you can tolerate.   There are many forms such as powders, tablets, wafers, gummies, etc Psyllium bran (Metamucil), methylcellulose (Citrucel), Miralax or Glycolax, Benefiber, Flax Seed.  Adjust the dose week-by-week (1/2 dose/day to 6 doses a day) until you are moving your bowels 1-2 times a day.  Cut back the dose or try a different fiber product if it is giving you problems such as diarrhea or bloating. Sometimes a laxative is needed to help jump-start bowels if constipated until the fiber supplement can help regulate your bowels.  If you are tolerating eating &  you are farting, it is okay to try a gentle laxative such as double dose MiraLax, prune juice, or Milk of Magnesia.  Avoid using laxatives too often. Stool softeners can sometimes help counteract the constipating effects of narcotic pain medicines.  It can also cause diarrhea, so avoid using for too long. If you are still constipated despite taking fiber daily, eating solids, and a few doses of laxatives, call our office. Controlling diarrhea Try drinking liquids and eating bland foods for a few days to avoid stressing your intestines further. Avoid dairy products (especially milk & ice cream) for a short time.  The intestines often can lose the ability to digest lactose when stressed. Avoid foods that cause gassiness or bloating.  Typical foods include beans and other legumes, cabbage, broccoli, and dairy foods.  Avoid greasy, spicy, fast foods.  Every person has some sensitivity to other foods, so listen to your body and avoid those foods that trigger problems for you. Probiotics (such as active yogurt, Align, etc) may help repopulate the intestines and colon with normal bacteria and calm down a sensitive digestive tract Adding a fiber supplement gradually can help thicken stools by absorbing excess fluid and retrain the intestines to act more normally.  Slowly increase the dose over a few weeks.  Too much fiber too soon can backfire and cause cramping & bloating. It is okay to try and slow down diarrhea with a few doses of antidiarrheal medicines.   Bismuth subsalicylate (ex. Kayopectate, Pepto Bismol) for a few doses can help control diarrhea.  Avoid if pregnant.   Loperamide (Imodium) can slow down diarrhea.  Start with one tablet (2mg ) first.  Avoid if you are having fevers or severe pain.  ILEOSTOMY PATIENTS WILL HAVE CHRONIC DIARRHEA since their colon is not in use.    Drink plenty of liquids.  You will need to drink even more glasses of water/liquid a day to avoid getting dehydrated. Record  output from your ileostomy.  Expect to empty the bag every 3-4 hours at first.  Most people with a permanent ileostomy empty their bag 4-6 times at the least.   Use antidiarrheal medicine (especially Imodium) several times a day to avoid getting dehydrated.  Start with a dose  at bedtime & breakfast.  Adjust up or down as needed.  Increase antidiarrheal medications as directed to avoid emptying the bag more than 8 times a day (every 3 hours). Work with your wound ostomy nurse to learn care for your ostomy.  See ostomy care instructions. TROUBLESHOOTING IRREGULAR BOWELS 1) Start with a soft & bland diet. No spicy, greasy, or fried foods.  2) Avoid gluten/wheat or dairy products from diet to see if symptoms improve. 3) Miralax 17gm or flax seed mixed in [place]. water or juice-daily. May use 2-4 times a day as needed. 4) Gas-X, Phazyme, etc. as needed for gas & bloating.  5) Prilosec (omeprazole) over-the-counter as needed 6)  Consider probiotics (Align, Activa, etc) to help calm the bowels down  Call your doctor if you are getting worse or not getting better.  Sometimes further testing (cultures, endoscopy, X-ray studies, CT scans, bloodwork, etc.) may be needed to help diagnose and treat the cause of the diarrhea. [facility], [place], [place], [place], [place]  [address] [phone] - Main.    [phone]  - Toll Free.   [phone] - Fax [email or web address]

## 2017-11-07 NOTE — Progress Notes (Signed)
Discharge instructions given to pt and all questions were answered. Pt taken down via wheelchair and was picked up by her daughter.  

## 2017-11-07 NOTE — Discharge Summary (Addendum)
Physician Discharge Summary    Patient ID: [name] [name] MRN: [id number] DOB/AGE: 03-02-1927  84 y.o.  Admit date: 11/04/2017 Discharge date: 11/07/2017   Hospital Stay = 3 days  Patient Care Team: Lawerance Cruel, MD as PCP - General Excell Seltzer, MD as Consulting Physician (General Surgery) Richmond Campbell, MD as Consulting Physician (Gastroenterology) Minus Breeding, MD as Consulting Physician (Cardiology) Brunetta Genera, MD as Consulting Physician (Hematology)  Discharge Diagnoses:  Principal Problem:   Primary cancer of cecum pT3, pN1b s/p lap colectomy 11/04/2017 Active Problems:   Essential hypertension, benign   Abnormal involuntary movement   Iron deficiency anemia   B12 deficiency anemia   3 Days Post-Op  11/04/2017  POST-OPERATIVE DIAGNOSIS:   CANCER CECUM  SURGERY:  11/04/2017  LAPAROSCOPIC RIGHT HEMICOLECTOMY  SURGEON:    Surgeon(s): Coralie Keens, MD Excell Seltzer, MD  Consults: None  Hospital Course:   The patient underwent the surgery above.  Postoperatively, the patient gradually mobilized and advanced to a solid diet.  Pain and other symptoms were treated aggressively.  Yeast rash Tx'd with fluconazole & topical nystatin.  By the time of discharge, the patient was walking well the hallways, eating regular food, having flatus & BMs.  Pain was well-controlled on an oral medications.  Barely needed gabapentin only.  Based on meeting discharge criteria and continuing to recover, I felt it was safe for the patient to be discharged from the hospital to further recover with close followup. I updated the patient's status to the patient, nurse, and family.  Recommendations were made.  Questions were answered.  They expressed understanding & appreciation.  Discharged Condition: good  Disposition:  [facility], [place]. Schedule an appointment as soon as possible for a visit in 10 day(s).     Specialty:  General Surgery Why:  For suture removal Contact information: [address] [place] [phone]       Excell Seltzer, MD. Schedule an appointment as soon as possible for a visit in 3 week(s).   Specialty:  General Surgery Why:  To follow up after your colon surgery Contact information: [place] [address] [phone]           Discharge disposition: 01-Home or Self Care       Discharge Instructions    Call MD for:   Complete by:  As directed    FEVER > 101.5 F  (temperatures < 101.5 F are not significant)   Call MD for:  extreme fatigue   Complete by:  As directed    Call MD for:  persistant dizziness or light-headedness   Complete by:  As directed    Call MD for:  persistant nausea and vomiting   Complete by:  As directed    Call MD for:  redness, tenderness, or signs of infection (pain, swelling, redness, odor or green/yellow discharge around incision site)   Complete by:  As directed    Call MD for:  severe uncontrolled pain   Complete by:  As directed    Diet - low sodium heart healthy   Complete by:  As directed    Start with a bland diet such as soups, liquids, starchy foods, low fat foods, etc. the first few days at home. Gradually advance to a solid, low-fat, high fiber diet by the end of the first week at home.   Add a fiber supplement to your diet (  Metamucil, etc) If you feel full, bloated, or constipated, stay on a full liquid or pureed/blenderized diet for a few days until you feel better and are no longer constipated.   Discharge instructions   Complete by:  As directed    See Discharge Instructions If you are not getting better after two weeks or are noticing you are getting worse, contact our office [phone] for further advice.  We may need to adjust your medications, re-evaluate you in the office, send you to the emergency room, or see what other  things we can do to help. The clinic staff is available to answer your questions during regular business hours (8:30am-5pm).  Please don't hesitate to call and ask to speak to one of our nurses for clinical concerns.    A surgeon from [facility] is always on call at the hospitals 24 hours/day If you have a medical emergency, go to the nearest emergency room or call 911.   Discharge wound care:   Complete by:  As directed    It is good for closed incisions and even open wounds to be washed every day.  Shower every day.  Short baths are fine.  Wash the incisions and wounds clean with soap & water.     You may leave closed incisions open to air if it is dry.   You may cover the incision with clean gauze & replace it after your daily shower for comfort.   You have skin staples, set up an appointment for them to be removed in the office in 10 days after surgery.   Driving Restrictions   Complete by:  As directed    You may drive when: - you are no longer taking narcotic prescription pain medication - you can comfortably wear a seatbelt - you can safely make sudden turns/stops without pain.   Increase activity slowly   Complete by:  As directed    Start light daily activities --- self-care, walking, climbing stairs- beginning the day after surgery.  Gradually increase activities as tolerated.  Control your pain to be active.  Stop when you are tired.  Ideally, walk several times a day, eventually an hour a day.   Most people are back to most day-to-day activities in a few weeks.  It takes 4-6 weeks to get back to unrestricted, intense activity. If you can walk 30 minutes without difficulty, it is safe to try more intense activity such as jogging, treadmill, bicycling, low-impact aerobics, swimming, etc. Save the most intensive and strenuous activity for last (Usually 4-8 weeks after surgery) such as sit-ups, heavy lifting, contact sports, etc.  Refrain from any intense heavy lifting or  straining until you are off narcotics for pain control.  You will have off days, but things should improve week-by-week. DO NOT PUSH THROUGH PAIN.  Let pain be your guide: If it hurts to do something, don't do it.   Lifting restrictions   Complete by:  As directed    If you can walk 30 minutes without difficulty, it is safe to try more intense activity such as jogging, treadmill, bicycling, low-impact aerobics, swimming, etc. Save the most intensive and strenuous activity for last (Usually 4-8 weeks after surgery) such as sit-ups, heavy lifting, contact sports, etc.   Refrain from any intense heavy lifting or straining until you are off narcotics for pain control.  You will have off days, but things should improve week-by-week. DO NOT PUSH THROUGH PAIN.  Let pain be your  guide: If it hurts to do something, don't do it.  Pain is your body warning you to avoid that activity for another week until the pain goes down.   May shower / Bathe   Complete by:  As directed    May walk up steps   Complete by:  As directed    Sexual Activity Restrictions   Complete by:  As directed    You may have sexual intercourse when it is comfortable. If it hurts to do something, stop.      Allergies as of 11/07/2017      Reactions   Aspirin Hives   Codeine Other (See Comments)   REACTION: GI upset   Hydrochlorothiazide Rash   Penicillins Hives, Shortness Of Breath, Rash, Other (See Comments)   Has patient had a PCN reaction causing immediate rash, facial/tongue/throat swelling, SOB or lightheadedness with hypotension: yes Has patient had a PCN reaction causing severe rash involving mucus membranes or skin necrosis: unknown Has patient had a PCN reaction that required hospitalization : unknown Has patient had a PCN reaction occurring within the last 10 years: no If all of the above answers are "NO", then may proceed with Cephalosporin use.   Sulfa Antibiotics Rash   Sulfonamide Derivatives Rash   Biotin Other  (See Comments)   Mouth sores   Levofloxacin Other (See Comments)   Joint aches and weakness   Macrodantin [nitrofurantoin] Other (See Comments)   Unknown reaction   Latex Rash      Medication List    TAKE these medications   amLODipine 10 MG tablet Commonly known as:  NORVASC TAKE 1 TABLET DAILY   camphor-menthol lotion Commonly known as:  SARNA Apply 1 application topically as needed for itching.   cloNIDine 0.2 MG tablet Commonly known as:  CATAPRES Take 2.5 tablets (0.5 mg total) by mouth daily. What changed:    how much to take  when to take this  additional instructions   gabapentin 300 MG capsule Commonly known as:  NEURONTIN Take 1 capsule (300 mg total) by mouth every 8 (eight) hours as needed (for pain).   hydrocortisone cream 1 % Apply 1 application topically daily as needed for itching.   lubiprostone 24 MCG capsule Commonly known as:  AMITIZA Take 24 mcg by mouth 2 (two) times daily with a meal.   metoprolol tartrate 25 MG tablet Commonly known as:  LOPRESSOR TAKE 1/2 TABLET TWICE A DAY   metroNIDAZOLE 500 MG tablet Commonly known as:  FLAGYL Take 1,000 mg by mouth See admin instructions. Take 1000 mg by mouth at 1400, 1500 and 2200 the day prior to surgery   neomycin 500 MG tablet Commonly known as:  MYCIFRADIN Take 1,000 mg by mouth See admin instructions. Take 1000 mg by mouth at 1400, 1500 and 2200 the day prior to surgery   nystatin powder Commonly known as:  MYCOSTATIN/NYSTOP Apply topically 3 (three) times daily. For skin rash under breasts & groins            Discharge Care Instructions  (From admission, onward)         Start     Ordered   11/07/17 0000  Discharge wound care:    Comments:  It is good for closed incisions and even open wounds to be washed every day.  Shower every day.  Short baths are fine.  Wash the incisions and wounds clean with soap & water.     You may leave closed incisions open to  air if it is dry.    You may cover the incision with clean gauze & replace it after your daily shower for comfort.   You have skin staples, set up an appointment for them to be removed in the office in 10 days after surgery.   11/07/17 1247          Significant Diagnostic Studies:  Diagnosis Colon, segmental resection for tumor, right - INVASIVE COLORECTAL ADENOCARCINOMA, 9.2 CM. - TUMOR EXTENDS INTO PERICECAL CONNECTIVE TISSUE. - MARGINS NOT INVOLVED. - METASTATIC CARCINOMA IN THREE OF TWENTY SIX LYMPH NODES (3/26) - HYPERPLASTIC POLYP (ONE). - BENIGN APPENDIX. Microscopic Comment COLON AND RECTUM: Resection, Including Transanal Disk Excision of Rectal Neoplasms Procedure: Right hemicolectomy Tumor Site: Cecum at ileocecal valve Tumor Size: 9.2 x 7.1 x 0.6 cm. Macroscopic Tumor Perforation: No Histologic Type: Colorectal adenocarcinoma Histologic Grade: Moderately differentiated Tumor Extension: Into pericecal connective tissue, see comment Margins: Free of tumor Treatment Effect: No Lymphovascular Invasion: Present Perineural Invasion: No Tumor Deposits: No Regional Lymph Nodes: Number of Lymph Nodes Involved: 3 Number of Lymph Nodes Examined: 26 Pathologic Stage Classification (pTNM, AJCC 8th Edition): pT3, pN1b Ancillary Studies: MMR and MSI testing pending Representative tumor block: 43F, 1H, 1I and 1J. 1 of 3 FINAL for DEONNA, KRUMMEL [phone]) Microscopic Comment(continued) Comments: The tumor is situated in the cecum and involves the ileocecal valve and extends contiguously into the distal aspect of the terminal ileum. The tumor focally involves the subserosal connective tissue in the cecum. (JDP:kh 11/06/17) (v4.0.1.0) Claudette Laws MD Pathologist, Electronic Signature (Case signed 11/06/2017) Specimen Sumi Lye and Clinical Information Specimen(s) Obtained: Colon, segmental resection for tumor, right Specimen Clinical Information cancer cecum [rd] Tyreisha Ungar Specimen: Received in  formalin labeled right colon Specimen integrity: Intact, with two stapled resection margins Specimen length: The specimen displays 5.5 cm of terminal ileum and 15.5 cm of right colon Mesorectal intactness: Not applicable Tumor location: Identified within the medial aspect of the cecum, involving the ileocecal valve and distal terminal ileum. Tumor size: There is a 9.2 x 7.1 x 0.6 cm tan red, flattened lesion with rolled borders identified. The ileocecal valve involved and essentially obliterated, with tumor extending into the distal aspect of the terminal ileum. Percent of bowel circumference involved: Approximately 80% Tumor distance to margins: Proximal: 2.9 cm Distal: 11.7 cm Radial (posterior ascending, posterior descending; lateral and posterior mid-rectum; and entire lower 1/3 rectum): 7.5 cm Macroscopic extent of tumor invasion: The tumor invades through the muscularis propria into the subserosal adipose tissue. The lesion extends from the cecum into the terminal ileum. Total presumed lymph nodes: Twenty five tan pink to gray possible lymph nodes are identified, ranging from 0.2 cm to 2.1 x 1.5 x 1.0 cm. Extramural satellite tumor nodules: Within the adipose tissue underlying the lesion, there is a 2.5 cm in greatest dimension tan white, indurated, ill defined possible nodule identified. Mucosal polyp(s): A 0.5 cm, tan pink sessile polyp is identified, measuring 1.6 cm from the edge of the cecal lesion. Additional findings: The uninvolved mucosa is tan pink with normal folding. On the serosal surface of the cecum and proximal ascending colon, mild to moderate adhesions are identified along with three tan white, slightly nodular polypoid structures ranging from 0.6 to 1.2 cm in greatest dimension. Sectioning the largest nodule reveals a cystic structure filled with tan milky fluid. The appendix is present, measuring 4.1 cm in length x 0.3 cm in diameter. The serosal surface is tan and  hyperemic. The mucosa is tan,  and the wall measures 0.1 cm in thickness, and the lumen is pinpoint. Block summary: A = proximal resection margin B, C = distal resection margin D = mucosal polyp E = serosal cystic nodule 2 of 3 FINAL for MONTIA, HASLIP ([id number]) Latrelle Bazar(continued) F = lesion from cecum to terminal ileum G = lesion in terminal ileum H = ileocecal valve I, J = additional sections of lesion K = possible extramural tumor nodule L = appendix. M-O = five possible lymph nodes, each P = two possible lymph nodes Q = two possible lymph nodes R = one possible lymph node, bisected S = one possible lymph node, bisected T = one possible lymph node, trisected. U, V = one possible lymph node, serially sectioned W, X = one possible lymph node, serially sectioned Y-ZA = one possible lymph node, trisected ZB = tissue for molecular studies. (KL:kh 11/05/17) Disclaimer Some of these immunohistochemical stains may have been developed and the performance characteristics determined by [facility]. Some may not have been cleared or approved by the U.S. Food and Drug Administration. The FDA has determined that such clearance or approval is not necessary. This test is used for clinical purposes. It should not be regarded as investigational or for research. This laboratory is certified under the [place] (CLIA-88) as qualified to perform high complexity clinical laboratory testing. Report signed out from the following location(s) Technical component and interpretation was performed at [facility] [place], [place], [place] [address]. CLIA #: [id number], 3 of  Results for orders placed or performed during the hospital encounter of 11/04/17 (from the past 72 hour(s))  CBC     Status: Abnormal   Collection Time: 11/05/17  4:29 AM  Result Value Ref Range   WBC 8.8 4.0 - 10.5 K/uL   RBC 3.95 3.87 - 5.11 MIL/uL    Hemoglobin 11.3 (L) 12.0 - 15.0 g/dL   HCT 36.3 36.0 - 46.0 %   MCV 91.9 78.0 - 100.0 fL   MCH 28.6 26.0 - 34.0 pg   MCHC 31.1 30.0 - 36.0 g/dL   RDW 18.4 (H) 11.5 - 15.5 %   Platelets 438 (H) 150 - 400 K/uL    Comment: Performed at [facility], [place] [address] [place], [place] [address]  Basic metabolic panel     Status: Abnormal   Collection Time: 11/05/17  4:29 AM  Result Value Ref Range   Sodium 140 135 - 145 mmol/L   Potassium 4.0 3.5 - 5.1 mmol/L   Chloride 109 98 - 111 mmol/L   CO2 24 22 - 32 mmol/L   Glucose, Bld 135 (H) 70 - 99 mg/dL   BUN 5 (L) 8 - 23 mg/dL   Creatinine, Ser 0.43 (L) 0.44 - 1.00 mg/dL   Calcium 7.9 (L) 8.9 - 10.3 mg/dL   GFR calc non Af Amer >60 >60 mL/min   GFR calc Af Amer >60 >60 mL/min    Comment: (NOTE) The eGFR has been calculated using the CKD EPI equation. This calculation has not been validated in all clinical situations. eGFR's persistently <60 mL/min signify possible Chronic Kidney Disease.    Anion gap 7 5 - 15    Comment: Performed at [facility], [place] [address] [place], [place] [address]  CBC     Status: Abnormal   Collection Time: 11/06/17  3:53 AM  Result Value Ref Range   WBC 5.9 4.0 - 10.5 K/uL  RBC 3.36 (L) 3.87 - 5.11 MIL/uL   Hemoglobin 9.6 (L) 12.0 - 15.0 g/dL   HCT 31.9 (L) 36.0 - 46.0 %   MCV 94.9 78.0 - 100.0 fL   MCH 28.6 26.0 - 34.0 pg   MCHC 30.1 30.0 - 36.0 g/dL   RDW 18.9 (H) 11.5 - 15.5 %   Platelets 369 150 - 400 K/uL    Comment: Performed at [facility], [place] [address], [place], [place] [address]  Basic metabolic panel     Status: Abnormal   Collection Time: 11/06/17  3:53 AM  Result Value Ref Range   Sodium 140 135 - 145 mmol/L   Potassium 4.4 3.5 - 5.1 mmol/L   Chloride 113 (H) 98 - 111 mmol/L   CO2 22 22 - 32 mmol/L   Glucose, Bld 91 70 - 99 mg/dL   BUN 6 (L) 8 - 23 mg/dL   Creatinine, Ser 0.55 0.44 - 1.00 mg/dL   Calcium 7.7 (L) 8.9 - 10.3  mg/dL   GFR calc non Af Amer >60 >60 mL/min   GFR calc Af Amer >60 >60 mL/min    Comment: (NOTE) The eGFR has been calculated using the CKD EPI equation. This calculation has not been validated in all clinical situations. eGFR's persistently <60 mL/min signify possible Chronic Kidney Disease.    Anion gap 5 5 - 15    Comment: Performed at [facility], [place] [address], [place], [place] [address]  CBC     Status: Abnormal   Collection Time: 11/07/17  3:32 AM  Result Value Ref Range   WBC 5.1 4.0 - 10.5 K/uL   RBC 3.46 (L) 3.87 - 5.11 MIL/uL   Hemoglobin 10.0 (L) 12.0 - 15.0 g/dL   HCT 32.4 (L) 36.0 - 46.0 %   MCV 93.6 78.0 - 100.0 fL   MCH 28.9 26.0 - 34.0 pg   MCHC 30.9 30.0 - 36.0 g/dL   RDW 18.8 (H) 11.5 - 15.5 %   Platelets 362 150 - 400 K/uL    Comment: Performed at [facility], [place] [address], [place], [place] [address]    No results found.  Discharge Exam: Blood pressure 119/88, pulse 84, temperature 97.8 F (36.6 C), temperature source Oral, resp. rate 18, height '5\' 4"'  (1.626 m), weight 72.1 kg, SpO2 100 %.  General: Pt awake/alert/oriented x4 in No acute distress Eyes: PERRL, normal EOM.  Sclera clear.  No icterus Neuro: CN II-XII intact w/o focal sensory/motor deficits. Lymph: No head/neck/groin lymphadenopathy Psych:  No delerium/psychosis/paranoia HENT: Normocephalic, Mucus membranes moist.  No thrush Neck: Supple, No tracheal deviation.  Stable resting neck tremor Chest: No chest wall pain w good excursion CV:  Pulses intact.  Regular rhythm MS: Normal AROM mjr joints.  No obvious deformity Abdomen: Soft.  Nondistended.  Mildly tender at incisions only.  No evidence of peritonitis.  No incarcerated hernias. Ext:  SCDs BLE.  No mjr edema.  No cyanosis Skin: No petechiae / purpura  Past Medical History:  Diagnosis Date  . Anemia   . Atrial tachycardia ([place])   . Cancer ([place])    colon  . Cough    yellow to clear sputum  cough for a while per pt no fever  . History of blood transfusion last transfusion 09-29-17  . HTN (hypertension)   . Jaundice due to hepatitis    at age 53 or 23  . Pneumonia 2014  . Transient global amnesia few yrs  ago x 2   AMS  . Tremors of nervous system    benign familial    Past Surgical History:  Procedure Laterality Date  . ABDOMINAL HERNIA REPAIR  yrs ago  . BACK SURGERY     years ago lower  . LAPAROSCOPIC PARTIAL COLECTOMY N/A 11/04/2017   Procedure: LAPAROSCOPIC RIGHT HEMICOLECTOMY;  Surgeon: Excell Seltzer, MD;  Location: [facility];  Service: General;  Laterality: N/A;  . TONSILLECTOMY  age 19    Social History   Socioeconomic History  . Marital status: Married    Spouse name: Not on file  . Number of children: Not on file  . Years of education: Not on file  . Highest education level: Not on file  Occupational History  . Not on file  Social Needs  . Financial resource strain: Not on file  . Food insecurity:    Worry: Not on file    Inability: Not on file  . Transportation needs:    Medical: Not on file    Non-medical: Not on file  Tobacco Use  . Smoking status: Never Smoker  . Smokeless tobacco: Never Used  . Tobacco comment: + prior 2nd hand exposure from spouse smoking  Substance and Sexual Activity  . Alcohol use: No    Alcohol/week: 0.0 standard drinks  . Drug use: No  . Sexual activity: Never  Lifestyle  . Physical activity:    Days per week: Not on file    Minutes per session: Not on file  . Stress: Not on file  Relationships  . Social connections:    Talks on phone: Not on file    Gets together: Not on file    Attends religious service: Not on file    Active member of club or organization: Not on file    Attends meetings of clubs or organizations: Not on file    Relationship status: Not on file  . Intimate partner violence:    Fear of current or ex partner: Not on file    Emotionally abused: Not on file    Physically abused: Not on  file    Forced sexual activity: Not on file  Other Topics Concern  . Not on file  Social History Narrative  . Not on file    Family History  Problem Relation Age of Onset  . Lung cancer Mother   . Parkinson's disease Mother   . Hypertension Mother   . Other Father        blood clot  . Arthritis Sister   . Other Sister        spinal stenosis    Current Facility-Administered Medications  Medication Dose Route Frequency Provider Last Rate Last Dose  . acetaminophen (TYLENOL) tablet 650 mg  650 mg Oral Q6H PRN Excell Seltzer, MD      . amLODipine (NORVASC) tablet 10 mg  10 mg Oral Daily Excell Seltzer, MD   10 mg at 11/07/17 0940  . cloNIDine (CATAPRES) tablet 0.3 mg  0.3 mg Oral QHS Excell Seltzer, MD   0.3 mg at 11/06/17 2209   And  . cloNIDine (CATAPRES) tablet 0.2 mg  0.2 mg Oral Daily Excell Seltzer, MD   0.2 mg at 11/07/17 0940  . enoxaparin (LOVENOX) injection 40 mg  40 mg Subcutaneous Q24H Excell Seltzer, MD   40 mg at 11/07/17 0824  . fluconazole (DIFLUCAN) tablet 200 mg  200 mg Oral Once Michael Boston, MD      . gabapentin (NEURONTIN) capsule  300 mg  300 mg Oral BID Excell Seltzer, MD   300 mg at 11/07/17 0940  . metoprolol tartrate (LOPRESSOR) tablet 12.5 mg  12.5 mg Oral BID Excell Seltzer, MD   12.5 mg at 11/07/17 0940  . morphine 2 MG/ML injection 2 mg  2 mg Intravenous Q3H PRN Excell Seltzer, MD      . nystatin (MYCOSTATIN/NYSTOP) topical powder   Topical TID Michael Boston, MD      . ondansetron [facility]) tablet 4 mg  4 mg Oral Q6H PRN Excell Seltzer, MD       Or  . ondansetron (ZOFRAN) injection 4 mg  4 mg Intravenous Q6H PRN Excell Seltzer, MD      . oxyCODONE (Oxy IR/ROXICODONE) immediate release tablet 5 mg  5 mg Oral Q4H PRN Excell Seltzer, MD       Facility-Administered Medications Ordered in Other Encounters  Medication Dose Route Frequency Provider Last Rate Last Dose  . clindamycin (CLEOCIN) 900 mg in dextrose 5 %  50 mL IVPB  900 mg Intravenous 60 min Pre-Op Excell Seltzer, MD         Allergies  Allergen Reactions  . Aspirin Hives  . Codeine Other (See Comments)    REACTION: GI upset  . Hydrochlorothiazide Rash  . Penicillins Hives, Shortness Of Breath, Rash and Other (See Comments)    Has patient had a PCN reaction causing immediate rash, facial/tongue/throat swelling, SOB or lightheadedness with hypotension: yes Has patient had a PCN reaction causing severe rash involving mucus membranes or skin necrosis: unknown Has patient had a PCN reaction that required hospitalization : unknown Has patient had a PCN reaction occurring within the last 10 years: no If all of the above answers are "NO", then may proceed with Cephalosporin use.   . Sulfa Antibiotics Rash  . Sulfonamide Derivatives Rash  . Biotin Other (See Comments)    Mouth sores  . Levofloxacin Other (See Comments)    Joint aches and weakness  . Macrodantin [Nitrofurantoin] Other (See Comments)    Unknown reaction  . Latex Rash    Signed: Morton Peters, MD, FACS, MASCRS Gastrointestinal and Minimally Invasive Surgery    [address], [place], [place] [address] [phone] Main / Paging [phone] Fax   11/07/2017, 12:50 PM

## 2017-11-09 ENCOUNTER — Other Ambulatory Visit: Payer: Self-pay | Admitting: Cardiology

## 2017-11-10 ENCOUNTER — Other Ambulatory Visit: Payer: Self-pay

## 2017-11-10 NOTE — Patient Outreach (Signed)
[facility]) Care Management  11/10/2017  ANNAHI SHORT January 11, 1928 [id number]   EMMI- General Discharge RED ON EMMI ALERT Day # 1 Date: Scheduled follow up Red Alert Reason:  Scheduled follow up? no  Outreach attempt: spoke with daughter Wells Guiles.  She is able to verify HIPAA.  She states that patient is doing ok since being home.  Discussed red alert.  She states that she took the call. She says that patient is to go to the surgeon office for staple removal on Thursday and will be setting up follow up appointment with surgeon at that time.  She states she will be taking patient to appointment.  Patient has medications and is taking as prescribed.  She declines any needs or concerns at this time.    Plan: RN CM will close case.    Jone Baseman, RN, MSN [facility] Care Management Care Management Coordinator Direct Line [phone] Toll Free: [phone]  Fax: [phone]

## 2017-11-11 ENCOUNTER — Telehealth: Payer: Self-pay

## 2017-11-11 NOTE — Telephone Encounter (Signed)
Received a call from patient's daughter with questions regarding a surgical pathology report that was released in my chart by Dr. Excell Seltzer. Patient had a right hemicolectomy performed by Dr. Excell Seltzer on 11/04/17. Patient's family has some concerns over the report. Patient's daughter also stated that patient is very fatigued and needs assistance with ADL's. Asked patient's daughter if patient has a follow-up appointment with Dr. Excell Seltzer and she stated not at this time but they would like to see Dr. Irene Limbo to discuss results. Dr. Irene Limbo made aware and patient scheduled for labs and an appointment with Dr. Irene Limbo on Friday 11/13/17 at 12:00. Daughter verbalized understanding and will call the office with any questions or concerns prior to visit Friday.

## 2017-11-12 NOTE — Progress Notes (Signed)
HEMATOLOGY/ONCOLOGY CLINIC NOTE  Date of Service:  11/13/17     Patient Care Team: Lawerance Cruel, MD as PCP - General Excell Seltzer, MD as Consulting Physician (General Surgery) Richmond Campbell, MD as Consulting Physician (Gastroenterology) Minus Breeding, MD as Consulting Physician (Cardiology) Brunetta Genera, MD as Consulting Physician (Hematology)  CHIEF COMPLAINTS/PURPOSE OF CONSULTATION:  Iron deficiency Anemia and B12 deficiency Newly diagnosed colon cancer  HISTORY OF PRESENTING ILLNESS:   [name] [name] is a wonderful 84 y.o. female who has been referred to [place] by Dr .Harrington Challenger, Dwyane Luo, MD for evaluation and management of microcytic anemia.  Patient has a h/o HTN, atrial tachycardia, , benign tremor who was noted to have a new severe microcytic anemia and weakness with a drop of hgb down to 7.4 in sept 2017. She was admitted to the hospital and received 1 unit of PRBC. She was noted to have severe new iron deficiency and also noted to have B12 deficiency. Her stool occult blood was apparently neg. Was discharged on PO iron and was taking it for a few months. Rpt stool studies with PCP were again noted to be hemoccult neg. She received B12 IM x 1 in the hospital but has not been on any B12 since then. Patient had refused a GI workup in the hospital and was to be seen by Dr Fuller Plan for GI workup in the outpatient setting but has refused and continues to refuse and GI workup.  Denies any overt GI bleeding. No melena no hematemesis no hematochezia. No nausea/vomiting or abdominal pain. No significant weight loss recently.  Her hgb improved some on Po iron but then started dropping again and so she was referred to [place] for consideration of IV iron replacement.  On CXR in the hospital she was incidentally noted to have a moderate to large hiatal hernia.   INTERVAL HISTORY   [name] [name] is here for fu of her newly diagnosed colon cancer. The patient's last  visit with [place] was on 10/14/17. She is accompanied today by her three daughters. The pt reports that she is doing well overall.   In the interim, the pt had a laparoscopic right hemicolectomy on 11/04/17 with Dr. Excell Seltzer for treatment of her colon cancer, with 3 lymph nodes implicated in metastasis. She will see Dr. Excell Seltzer on 11/1 for follow up.   The pt reports that she has been feeling very well after surgery. She notes that her bowel movements have been becoming more solid, and she has had no problems moving her bowels. She notes that she isn't having any pain. The pt also notes that she has been eating well.   The pt has continued on Amlodipine, Clonidine, and Lopressor and does present today with a HR of 46bpm. She denies light headedness and dizziness.    The pt is using a walker and her daughters believe that she is not functioning quite as well on her own and could benefit from PT. The pt also notes that she is currently home bound.   Lab results today (11/13/17) of CBC w/diff, CMP is as follows: all values are WNL except for HGB at 11.3, RDW at 19.5, Glucose at 103, Calcium at 8.3, Total Protein at 6.4, Albumin at 3.0, AST at 12, Total Bilirubin at <0.2. 11/13/17 Iron and TIBC shows all values WNL except for 18% Saturation ratio 11/13/17 Ferritin is pending.   On review of systems, pt reports good energy levels, moving her bowels well,  eating well, sleeping well, healed surgical incision, and denies abdominal pain, blood in the stools, concerns for infections, light headedness, dizziness, mouth sores, leg swelling, and any other symptoms.   MEDICAL HISTORY:  Past Medical History:  Diagnosis Date  . Anemia   . Atrial tachycardia ([place])   . Cancer ([place])    colon  . CHICKENPOX, HX OF 09/07/2008   Qualifier: Diagnosis of  By: Asa Lente MD, Jannifer Rodney Cough    yellow to clear sputum cough for a while per pt no fever  . HCAP (healthcare-associated pneumonia) 10/31/2015  . History of blood  transfusion last transfusion 09-29-17  . HTN (hypertension)   . Jaundice due to hepatitis    at age 57 or 68  . Pneumonia 2014  . Transient global amnesia few yrs ago x 2   AMS  . Tremors of nervous system    benign familial  Large Hiatal hernia HCAP 10/2015 Iron deficiency Anemia B12 deficiency.  SURGICAL HISTORY: Past Surgical History:  Procedure Laterality Date  . ABDOMINAL HERNIA REPAIR  yrs ago  . BACK SURGERY     years ago lower  . LAPAROSCOPIC PARTIAL COLECTOMY N/A 11/04/2017   Procedure: LAPAROSCOPIC RIGHT HEMICOLECTOMY;  Surgeon: Excell Seltzer, MD;  Location: [facility] ORS;  Service: General;  Laterality: N/A;  . TONSILLECTOMY  age 35    SOCIAL HISTORY: Social History   Socioeconomic History  . Marital status: Married    Spouse name: Not on file  . Number of children: Not on file  . Years of education: Not on file  . Highest education level: Not on file  Occupational History  . Not on file  Social Needs  . Financial resource strain: Not on file  . Food insecurity:    Worry: Not on file    Inability: Not on file  . Transportation needs:    Medical: Not on file    Non-medical: Not on file  Tobacco Use  . Smoking status: Never Smoker  . Smokeless tobacco: Never Used  . Tobacco comment: + prior 2nd hand exposure from spouse smoking  Substance and Sexual Activity  . Alcohol use: No    Alcohol/week: 0.0 standard drinks  . Drug use: No  . Sexual activity: Never  Lifestyle  . Physical activity:    Days per week: Not on file    Minutes per session: Not on file  . Stress: Not on file  Relationships  . Social connections:    Talks on phone: Not on file    Gets together: Not on file    Attends religious service: Not on file    Active member of club or organization: Not on file    Attends meetings of clubs or organizations: Not on file    Relationship status: Not on file  . Intimate partner violence:    Fear of current or ex partner: Not on file    Emotionally  abused: Not on file    Physically abused: Not on file    Forced sexual activity: Not on file  Other Topics Concern  . Not on file  Social History Narrative  . Not on file    FAMILY HISTORY: Family History  Problem Relation Age of Onset  . Lung cancer Mother   . Parkinson's disease Mother   . Hypertension Mother   . Other Father        blood clot  . Arthritis Sister   . Other Sister  spinal stenosis    ALLERGIES:  is allergic to aspirin; codeine; hydrochlorothiazide; penicillins; sulfa antibiotics; sulfonamide derivatives; biotin; levofloxacin; macrodantin [nitrofurantoin]; and latex.  MEDICATIONS:  Current Outpatient Medications  Medication Sig Dispense Refill  . amLODipine (NORVASC) 10 MG tablet TAKE 1 TABLET DAILY 90 tablet 1  . camphor-menthol (SARNA) lotion Apply 1 application topically as needed for itching.    . cloNIDine (CATAPRES) 0.2 MG tablet Take 2.5 tablets (0.5 mg total) by mouth daily. (Patient taking differently: Take 0.2-0.3 mg by mouth See admin instructions. Take 0.2 mg by mouth in the morning and take 0.3 mg by mouth at bedtime) 225 tablet 1  . gabapentin (NEURONTIN) 300 MG capsule Take 1 capsule (300 mg total) by mouth every 8 (eight) hours as needed (for pain). 20 capsule 1  . hydrocortisone cream 1 % Apply 1 application topically daily as needed for itching.    . lubiprostone (AMITIZA) 24 MCG capsule Take 24 mcg by mouth 2 (two) times daily with a meal.     . metoprolol tartrate (LOPRESSOR) 25 MG tablet TAKE 1/2 TABLET TWICE A DAY 90 tablet 1  . metroNIDAZOLE (FLAGYL) 500 MG tablet Take 1,000 mg by mouth See admin instructions. Take 1000 mg by mouth at 1400, 1500 and 2200 the day prior to surgery  0  . neomycin (MYCIFRADIN) 500 MG tablet Take 1,000 mg by mouth See admin instructions. Take 1000 mg by mouth at 1400, 1500 and 2200 the day prior to surgery  0  . nystatin (MYCOSTATIN/NYSTOP) powder Apply topically 3 (three) times daily. For skin rash under  breasts & groins 15 g 2   No current facility-administered medications for this visit.    Facility-Administered Medications Ordered in Other Visits  Medication Dose Route Frequency Provider Last Rate Last Dose  . clindamycin (CLEOCIN) 900 mg in dextrose 5 % 50 mL IVPB  900 mg Intravenous 60 min Pre-Op Excell Seltzer, MD        REVIEW OF SYSTEMS:    A 10+ POINT REVIEW OF SYSTEMS WAS OBTAINED including neurology, dermatology, psychiatry, cardiac, respiratory, lymph, extremities, GI, GU, Musculoskeletal, constitutional, breasts, reproductive, HEENT.  All pertinent positives are noted in the HPI.  All others are negative.   PHYSICAL EXAMINATION:  ECOG PERFORMANCE STATUS: 2 - Symptomatic, <50% confined to bed  Vitals:   11/13/17 1247  BP: 113/65  Pulse: (!) 46  Resp: 16  Temp: 98.5 F (36.9 C)  SpO2: 99%   Filed Weights   11/13/17 1247  Weight: 151 lb 6.4 oz (68.7 kg)   .Body mass index is 25.99 kg/m.  GENERAL:alert, in no acute distress and comfortable SKIN: no acute rashes, no significant lesions EYES: conjunctiva are pink and non-injected, sclera anicteric OROPHARYNX: MMM, no exudates, no oropharyngeal erythema or ulceration NECK: supple, no JVD LYMPH:  no palpable lymphadenopathy in the cervical, axillary or inguinal regions LUNGS: clear to auscultation b/l with normal respiratory effort HEART: regular rate & rhythm ABDOMEN:  normoactive bowel sounds , non tender, not distended. No palpable hepatosplenomegaly. Healed surgical incision.  Extremity: 1+ pedal edema PSYCH: alert & oriented x 3 with fluent speech NEURO: no focal motor/sensory deficits   LABORATORY DATA:  I have reviewed the data as listed  . CBC Latest Ref Rng & Units 11/13/2017 11/07/2017 11/06/2017  WBC 3.9 - 10.3 K/uL 4.7 5.1 5.9  Hemoglobin 11.6 - 15.9 g/dL 11.3(L) 10.0(L) 9.6(L)  Hematocrit 34.8 - 46.6 % 34.9 32.4(L) 31.9(L)  Platelets 145 - 400 K/uL 375 362 369    .  CMP Latest Ref Rng & Units  11/13/2017 11/06/2017 11/05/2017  Glucose 70 - 99 mg/dL 103(H) 91 135(H)  BUN 8 - 23 mg/dL 14 6(L) 5(L)  Creatinine 0.44 - 1.00 mg/dL 0.69 0.55 0.43(L)  Sodium 135 - 145 mmol/L 139 140 140  Potassium 3.5 - 5.1 mmol/L 4.2 4.4 4.0  Chloride 98 - 111 mmol/L 108 113(H) 109  CO2 22 - 32 mmol/L 25 22 24   Calcium 8.9 - 10.3 mg/dL 8.3(L) 7.7(L) 7.9(L)  Total Protein 6.5 - 8.1 g/dL 6.4(L) - -  Total Bilirubin 0.3 - 1.2 mg/dL <0.2(L) - -  Alkaline Phos 38 - 126 U/L 115 - -  AST 15 - 41 U/L 12(L) - -  ALT 0 - 44 U/L 11 - -   . Lab Results  Component Value Date   IRON 49 11/13/2017   TIBC 269 11/13/2017   IRONPCTSAT 18 (L) 11/13/2017   (Iron and TIBC)  Lab Results  Component Value Date   FERRITIN 400 (H) 11/13/2017    Component     Latest Ref Rng & Units 05/08/2016  Parietal Cell Ab     0.0 - 20.0 Units 3.5  Intrinsic Factor Abs, Serum     0.0 - 1.1 AU/mL 0.9   11/06/17 Surgical Biopsy:     RADIOGRAPHIC STUDIES: I have personally reviewed the radiological images as listed and agreed with the findings in the report. No results found.  ASSESSMENT & PLAN:   84 y.o. caucasian female with   1) Severe Microcytic Anemia s/p PRBc transfusion in sept 2017.  This appears to be likely related to severe iron deficiency due to cecal adenocarcinoma  2) Severe Iron deficiency- due to chronic GI losses from cecal adenocarcinoma . Lab Results  Component Value Date   IRON 49 11/13/2017   TIBC 269 11/13/2017   IRONPCTSAT 18 (L) 11/13/2017   (Iron and TIBC)  Lab Results  Component Value Date   FERRITIN 400 (H) 11/13/2017   3) B12 deficiency - antiparietal cell and anti IF ab neg. B12 levels adequate with current replacement. B12 --- 375 PLAN -I discussed goal is to maintain Ferritin > 100 and iron saturation at 20%.  -continue vit B complex 1 tab po daily to support accelerated hematopoiesis.  4. Recently diagnosed cecal adenocarcinoma - with evidence of local  Lnadneopathy 07/01/17 PET revealed Hypermetabolic colon mass in the vicinity of the ileocecal valve, maximum SUV 17.9, compatible with malignancy.   PLAN:  -Discussed pt labwork today, 11/13/17; HGB is increased to 11.3 after surgery. -11/13/17 Ferritin is 400, no indication for IV Injectafer at this time. -No indication for blood transfusion at this time -Discussed the 11/04/17 Surgical pathology which revealed 9.2cm invasive colorectal adenocarcinoma, clear margins, and three implicated lymph nodes in metastasis, with N1B status.  -Discussed with pt and family that staging is at least Stage III B -Will repeat CT C/A/P at least 4-6 weeks post 11/04/17 surgery, which the pt prefers as part of her goals of care  -Discussed the possible treatment options based on staging -May be a role to back off of Clonidine, 10mg  Amlodipine, 25mg  Lopressor in post-surgical setting. Pt notes that she will discuss this further with her cardiologist.  -Per the pt and family's request, and as the pt is currently home bound, will refer pt to home care PT/OT -Will refer pt to Nutritional therapist and Social worker's per pt and family request -Will see the pt back in 6 weeks   4)  . Patient  Active Problem List   Diagnosis Date Noted  . Primary cancer of cecum pT3, pN1b s/p lap colectomy 11/04/2017 11/04/2017  . Chronic idiopathic constipation 04/27/2017  . Iron deficiency anemia 10/20/2015  . B12 deficiency anemia 10/20/2015  . Symptomatic anemia 10/19/2015  . UTI (urinary tract infection) 07/26/2014  . TGA (transient global amnesia) 07/25/2014  . Cardiac dysrhythmia 02/06/2010  . PALPITATIONS 02/06/2010  . Essential hypertension, benign 05/22/2008  . PSVT 03/27/2008  . OBESITY, UNSPECIFIED 03/26/2008  . Abnormal involuntary movement 03/26/2008  . AMNESIA, TRANSIENT GLOBAL 01/10/2007  -advised continued f/u with PCP for management of her other medical co-morbids   -SW consultation in 3-4 days -Nutritional  consultation in 1 week -[facility] -CT Chest/abd/pelvis in 5 weeks -RTC with Dr Irene Limbo with labs in 6 weeks    All of the patients and multiple family members questions were answered with apparent satisfaction. The patient knows to call the clinic with any problems, questions or concerns.  The total time spent in the appt was 40 minutes and more than 50% was on counseling and direct patient cares.     Sullivan Lone MD MS AAHIVMS [facility] Hematology/Oncology Physician [facility]  (Office):       [phone] (Work cell):  [phone] (Fax):           [phone]  I, Baldwin [place], am acting as a scribe for Dr. Irene Limbo  .I have reviewed the above documentation for accuracy and completeness, and I agree with the above. Brunetta Genera MD

## 2017-11-13 ENCOUNTER — Telehealth: Payer: Self-pay | Admitting: Cardiology

## 2017-11-13 ENCOUNTER — Inpatient Hospital Stay: Payer: MEDICARE

## 2017-11-13 ENCOUNTER — Encounter: Payer: Self-pay | Admitting: Hematology

## 2017-11-13 ENCOUNTER — Telehealth: Payer: Self-pay | Admitting: Hematology

## 2017-11-13 ENCOUNTER — Inpatient Hospital Stay: Payer: MEDICARE | Attending: Hematology | Admitting: Hematology

## 2017-11-13 VITALS — BP 113/65 | HR 46 | Temp 98.5°F | Resp 16 | Ht 64.0 in | Wt 151.4 lb

## 2017-11-13 DIAGNOSIS — E538 Deficiency of other specified B group vitamins: Secondary | ICD-10-CM

## 2017-11-13 DIAGNOSIS — D509 Iron deficiency anemia, unspecified: Secondary | ICD-10-CM | POA: Diagnosis not present

## 2017-11-13 DIAGNOSIS — C180 Malignant neoplasm of cecum: Secondary | ICD-10-CM | POA: Diagnosis not present

## 2017-11-13 DIAGNOSIS — D500 Iron deficiency anemia secondary to blood loss (chronic): Secondary | ICD-10-CM

## 2017-11-13 DIAGNOSIS — Z9189 Other specified personal risk factors, not elsewhere classified: Secondary | ICD-10-CM

## 2017-11-13 LAB — IRON AND TIBC
Iron: 49 ug/dL (ref 41–142)
Saturation Ratios: 18 % — ABNORMAL LOW (ref 21–57)
TIBC: 269 ug/dL (ref 236–444)
UIBC: 219 ug/dL

## 2017-11-13 LAB — CBC WITH DIFFERENTIAL/PLATELET
Basophils Absolute: 0 10*3/uL (ref 0.0–0.1)
Basophils Relative: 1 %
Eosinophils Absolute: 0.2 10*3/uL (ref 0.0–0.5)
Eosinophils Relative: 4 %
HEMATOCRIT: 34.9 % (ref 34.8–46.6)
Hemoglobin: 11.3 g/dL — ABNORMAL LOW (ref 11.6–15.9)
LYMPHS ABS: 0.9 10*3/uL (ref 0.9–3.3)
Lymphocytes Relative: 19 %
MCH: 29.2 pg (ref 25.1–34.0)
MCHC: 32.4 g/dL (ref 31.5–36.0)
MCV: 90 fL (ref 79.5–101.0)
MONO ABS: 0.5 10*3/uL (ref 0.1–0.9)
MONOS PCT: 10 %
NEUTROS ABS: 3.2 10*3/uL (ref 1.5–6.5)
Neutrophils Relative %: 66 %
Platelets: 375 10*3/uL (ref 145–400)
RBC: 3.88 MIL/uL (ref 3.70–5.45)
RDW: 19.5 % — AB (ref 11.2–14.5)
WBC: 4.7 10*3/uL (ref 3.9–10.3)

## 2017-11-13 LAB — CMP (CANCER CENTER ONLY)
ALBUMIN: 3 g/dL — AB (ref 3.5–5.0)
ALK PHOS: 115 U/L (ref 38–126)
ALT: 11 U/L (ref 0–44)
ANION GAP: 6 (ref 5–15)
AST: 12 U/L — AB (ref 15–41)
BUN: 14 mg/dL (ref 8–23)
CHLORIDE: 108 mmol/L (ref 98–111)
CO2: 25 mmol/L (ref 22–32)
Calcium: 8.3 mg/dL — ABNORMAL LOW (ref 8.9–10.3)
Creatinine: 0.69 mg/dL (ref 0.44–1.00)
GFR, Est AFR Am: >60 mL/min (ref >60)
GFR, Estimated: >60 mL/min (ref >60)
GLUCOSE: 103 mg/dL — AB (ref 70–99)
Potassium: 4.2 mmol/L (ref 3.5–5.1)
SODIUM: 139 mmol/L (ref 135–145)
Total Bilirubin: <0.2 mg/dL — ABNORMAL LOW (ref 0.3–1.2)
Total Protein: 6.4 g/dL — ABNORMAL LOW (ref 6.5–8.1)

## 2017-11-13 LAB — FERRITIN: FERRITIN: 400 ng/mL — AB (ref 11–307)

## 2017-11-13 NOTE — Telephone Encounter (Signed)
Returned call to patient's daughter Jacqlyn Larsen.She stated mother's heart rate has been slow since she had recent colon surgery.Heart rate in hospital in 30's,since she has been home 46,49,she has been dizzy at times.Spoke to our pharmacist Raquel she advised to decrease metoprolol to 25 mg 1/2 tablet daily.Appointment scheduled with Fabian Sharp PA 11/17/17 at 11:30 am.

## 2017-11-13 NOTE — Telephone Encounter (Signed)
New Message          STAT if HR is under 50 or over 120 (normal HR is 60-100 beats per minute)  What is your heart rate? 46 first time 34 second time taken by the Oncology  1) Do you have a log of your heart rate readings (document readings)? Taken while in the hospital went down to the 30's  2) Do you have any other symptoms? Dizziness. Not continued dizziness

## 2017-11-13 NOTE — Telephone Encounter (Signed)
Appts scheduled avs/calendar declined due to my chart per 10/4 los °

## 2017-11-16 ENCOUNTER — Telehealth: Payer: Self-pay | Admitting: Hematology

## 2017-11-16 NOTE — Progress Notes (Signed)
Cardiology Office Note:    Date:  11/17/2017   ID:  [name] [name], DOB 12-16-1927, MRN [id number]  PCP:  Lawerance Cruel, MD  Cardiologist:  Minus Breeding, MD   Referring MD: Lawerance Cruel, MD   Chief Complaint  Patient presents with  . Follow-up    bradycardia    History of Present Illness:    [name] [name] is a 84 y.o. female with a hx of hypertension, benign familial tumor, and atrial tachycardia.  She is followed with Dr. Percival Spanish mainly for hypertension.  History of arrhythmia has been controlled on Lopressor.  Echo in December 2008 with normal LVEF of 55 to 60%.  She was last seen in clinic on 07/22/2017 for preoperative clearance for colon surgery status post malignant neoplasm of the colon.  Echocardiogram was ordered and revealed normal LVEF of 60 to 65%, no wall motion abnormality, moderately dilated left atrium, and atrial septal aneurysm was noted.   She returns today for follow-up.  She underwent colon surgery approximately 2 weeks ago.  She is doing well and recovering from the surgery.  However, while in the hospital she experienced symptomatic bradycardia in the 30s.  Her Lopressor was reduced to half a tablet twice daily.  Caregivers have continued to track her heart rate which is routinely been in the 40s and 50s.  She is sometimes symptomatic with this reporting dizziness but no syncope.  Her pressure has been well controlled in the 259D systolic. I do not see evidence of heart block or arrhythmia in Epic.    Past Medical History:  Diagnosis Date  . Anemia   . Atrial tachycardia ([place])   . Cancer ([place])    colon  . CHICKENPOX, HX OF 09/07/2008   Qualifier: Diagnosis of  By: Asa Lente MD, Jannifer Rodney Cough    yellow to clear sputum cough for a while per pt no fever  . HCAP (healthcare-associated pneumonia) 10/31/2015  . History of blood transfusion last transfusion 09-29-17  . HTN (hypertension)   . Jaundice due to hepatitis    at age 36 or 30  .  Pneumonia 2014  . Transient global amnesia few yrs ago x 2   AMS  . Tremors of nervous system    benign familial    Past Surgical History:  Procedure Laterality Date  . ABDOMINAL HERNIA REPAIR  yrs ago  . BACK SURGERY     years ago lower  . LAPAROSCOPIC PARTIAL COLECTOMY N/A 11/04/2017   Procedure: LAPAROSCOPIC RIGHT HEMICOLECTOMY;  Surgeon: Excell Seltzer, MD;  Location: [facility];  Service: General;  Laterality: N/A;  . TONSILLECTOMY  age 72    Current Medications: Current Meds  Medication Sig  . amLODipine (NORVASC) 10 MG tablet TAKE 1 TABLET DAILY  . cloNIDine (CATAPRES) 0.2 MG tablet Take 2.5 tablets (0.5 mg total) by mouth daily. (Patient taking differently: Take 0.2-0.3 mg by mouth See admin instructions. Take 0.2 mg by mouth in the morning and take 0.3 mg by mouth at bedtime)  . [DISCONTINUED] hydrocortisone cream 1 % Apply 1 application topically daily as needed for itching.  . [DISCONTINUED] metoprolol tartrate (LOPRESSOR) 25 MG tablet Take 1/2 tablet daily     Allergies:   Aspirin; Codeine; Hydrochlorothiazide; Penicillins; Sulfa antibiotics; Sulfonamide derivatives; Biotin; Levofloxacin; Macrodantin [nitrofurantoin]; and Latex   Social History   Socioeconomic History  . Marital status: Married    Spouse name: Not on file  . Number of children: Not on file  .  Years of education: Not on file  . Highest education level: Not on file  Occupational History  . Not on file  Social Needs  . Financial resource strain: Not on file  . Food insecurity:    Worry: Not on file    Inability: Not on file  . Transportation needs:    Medical: Not on file    Non-medical: Not on file  Tobacco Use  . Smoking status: Never Smoker  . Smokeless tobacco: Never Used  . Tobacco comment: + prior 2nd hand exposure from spouse smoking  Substance and Sexual Activity  . Alcohol use: No    Alcohol/week: 0.0 standard drinks  . Drug use: No  . Sexual activity: Never  Lifestyle  .  Physical activity:    Days per week: Not on file    Minutes per session: Not on file  . Stress: Not on file  Relationships  . Social connections:    Talks on phone: Not on file    Gets together: Not on file    Attends religious service: Not on file    Active member of club or organization: Not on file    Attends meetings of clubs or organizations: Not on file    Relationship status: Not on file  Other Topics Concern  . Not on file  Social History Narrative  . Not on file     Family History: The patient's family history includes Arthritis in her sister; Hypertension in her mother; Lung cancer in her mother; Other in her father and sister; Parkinson's disease in her mother.  ROS:   Please see the history of present illness.     All other systems reviewed and are negative.  EKGs/Labs/Other Studies Reviewed:    The following studies were reviewed today:  Echo 07/27/17: Study Conclusions  - Left ventricle: The cavity size was normal. Wall thickness was   normal. Systolic function was normal. The estimated ejection   fraction was in the range of 60% to 65%. Wall motion was normal;   there were no regional wall motion abnormalities. Doppler   parameters are consistent with high ventricular filling pressure. - Aortic valve: Valve area (VTI): 2.37 cm^2. Valve area (Vmax):   2.59 cm^2. Valve area (Vmean): 2.35 cm^2. - Mitral valve: Calcified annulus. - Left atrium: The atrium was moderately dilated. - Right ventricle: The cavity size was mildly dilated. - Right atrium: The atrium was moderately dilated. - Atrial septum: There was an atrial septal aneurysm. - Pulmonary arteries: Systolic pressure was moderately increased.   PA peak pressure: 46 mm Hg (S).  Impressions: - Normal LV systolic function; elevated LV filling pressure;   biatrial enlargement; mild RVE; mild TR with moderate pulmonary   hypertension.  EKG:  EKG is ordered today.  The ekg ordered today demonstrates  sinus bradycardia  Recent Labs: 11/13/2017: ALT 11; BUN 14; Creatinine 0.69; Hemoglobin 11.3; Platelets 375; Potassium 4.2; Sodium 139  Recent Lipid Panel    Component Value Date/Time   CHOL (H) 01/10/2007 0003    236        ATP III CLASSIFICATION:  <200     mg/dL   Desirable  200-239  mg/dL   Borderline High  >=240    mg/dL   High   TRIG 132 01/10/2007 0003   HDL 42 01/10/2007 0003   CHOLHDL 5.6 01/10/2007 0003   VLDL 26 01/10/2007 0003   LDLCALC (H) 01/10/2007 0003    168  Total Cholesterol/HDL:CHD Risk Coronary Heart Disease Risk Table                     Men   Women  1/2 Average Risk   3.4   3.3    Physical Exam:    VS:  BP (!) 122/54 (BP Location: Left Arm, Patient Position: Sitting, Cuff Size: Normal)   Pulse (!) 50   Ht 5\' 4"  (1.626 m)   Wt 150 lb (68 kg)   BMI 25.75 kg/m     Wt Readings from Last 3 Encounters:  11/17/17 150 lb (68 kg)  11/13/17 151 lb 6.4 oz (68.7 kg)  11/04/17 158 lb 15.2 oz (72.1 kg)     GEN: elderly female in no acute distress HEENT: Normal NECK: No JVD; No carotid bruits CARDIAC: RRR, + murmurs, rubs, gallops RESPIRATORY:  Clear to auscultation without rales, wheezing or rhonchi  ABDOMEN: Soft, non-tender, non-distended MUSCULOSKELETAL:  No edema; No deformity  SKIN: Warm and dry NEUROLOGIC:  Alert and oriented x 3 PSYCHIATRIC:  Normal affect   ASSESSMENT:    1. Symptomatic sinus bradycardia   2. PSVT   3. Palpitations   4. Essential hypertension, benign    PLAN:    In order of problems listed above:  Symptomatic sinus bradycardia PSVT Palpitations I will stop lopressor. HR has been documented in he 45s and does not increase with activity. Family/caregivers will continue to track HR and BP. If D/C lopressor doesn't help her bradycardia, may need to see EP. Given her history of PSVT, tachycardia, and palpitations, she may be at risk for SSS/tachy-brady syndrome.    Essential hypertension, benign Pressures have been  well-controlled.    Keep follow up appt with Dr. Percival Spanish in November.   Medication Adjustments/Labs and Tests Ordered: Current medicines are reviewed at length with the patient today.  Concerns regarding medicines are outlined above.  Orders Placed This Encounter  Procedures  . EKG 12-Lead   No orders of the defined types were placed in this encounter.   Signed, Tami Lin Duke, PA  11/17/2017 1:12 PM    [facility]

## 2017-11-16 NOTE — Telephone Encounter (Signed)
Appts scheduled avs/calendar printed per 10/4 los °

## 2017-11-17 ENCOUNTER — Encounter: Payer: Self-pay | Admitting: Physician Assistant

## 2017-11-17 ENCOUNTER — Encounter: Payer: Self-pay | Admitting: *Deleted

## 2017-11-17 ENCOUNTER — Ambulatory Visit (INDEPENDENT_AMBULATORY_CARE_PROVIDER_SITE_OTHER): Payer: MEDICARE | Admitting: Physician Assistant

## 2017-11-17 VITALS — BP 122/54 | HR 50 | Ht 64.0 in | Wt 150.0 lb

## 2017-11-17 DIAGNOSIS — I10 Essential (primary) hypertension: Secondary | ICD-10-CM

## 2017-11-17 DIAGNOSIS — R002 Palpitations: Secondary | ICD-10-CM

## 2017-11-17 DIAGNOSIS — I471 Supraventricular tachycardia: Secondary | ICD-10-CM

## 2017-11-17 DIAGNOSIS — R001 Bradycardia, unspecified: Secondary | ICD-10-CM

## 2017-11-17 NOTE — Patient Instructions (Signed)
Medication Instructions:  STOP Lopressor If you need a refill on your cardiac medications before your next appointment, please call your pharmacy.   Lab work: None  If you have labs (blood work) drawn today and your tests are completely normal, you will receive your results only by: Marland Kitchen MyChart Message (if you have MyChart) OR . A paper copy in the mail If you have any lab test that is abnormal or we need to change your treatment, we will call you to review the results.  Testing/Procedures: None   Follow-Up: At [facility], you and your health needs are our priority.  As part of our continuing mission to provide you with exceptional heart care, we have created designated Provider Care Teams.  These Care Teams include your primary Cardiologist (physician) and Advanced Practice Providers (APPs -  Physician Assistants and Nurse Practitioners) who all work together to provide you with the care you need, when you need it. Follow up as scheduled  Any Other Special Instructions Will Be Listed Below (If Applicable). Record your blood pressure on log given.

## 2017-11-17 NOTE — Progress Notes (Signed)
[place] Work  Clinical Social Work received referral from Futures trader.  [place] contacted patients daughter at home to offer support and assess for needs.  Patient's daughter, Marcie Bal stated her mother is requiring around the clock care.  Patients other 2 daughter are currently providing care at home.  Marcie Bal stated that patient has decided to not pursue active treatment, but was in need of additional resources in the home.  CSW and Marcie Bal discussed palliative care and hospice services.  Marcie Bal plans to discuss this with her family and contact healthcare team once they make a decision.  CSW and Marcie Bal discussed home care vs home health.  Marcie Bal stated patient may have a long term care policy through her secondary insurance to cover home care cost.  CSW encouraged Marcie Bal to call insurance company to determine patients benefits.  Patients family plans to contact insurance company as soon as possible, and will contact CSW with any additional questions or concerns.    Johnnye Lana, MSW, LCSW, OSW-C Clinical Social Worker [facility] [phone]

## 2017-11-18 ENCOUNTER — Telehealth: Payer: Self-pay | Admitting: *Deleted

## 2017-11-18 ENCOUNTER — Encounter: Payer: MEDICARE | Admitting: Nutrition

## 2017-11-18 ENCOUNTER — Other Ambulatory Visit: Payer: Self-pay | Admitting: *Deleted

## 2017-11-18 DIAGNOSIS — I10 Essential (primary) hypertension: Secondary | ICD-10-CM | POA: Diagnosis not present

## 2017-11-18 DIAGNOSIS — D553 Anemia due to disorders of nucleotide metabolism: Secondary | ICD-10-CM | POA: Diagnosis not present

## 2017-11-18 DIAGNOSIS — Z792 Long term (current) use of antibiotics: Secondary | ICD-10-CM | POA: Diagnosis not present

## 2017-11-18 DIAGNOSIS — R627 Adult failure to thrive: Secondary | ICD-10-CM | POA: Diagnosis not present

## 2017-11-18 DIAGNOSIS — Z79899 Other long term (current) drug therapy: Secondary | ICD-10-CM | POA: Diagnosis not present

## 2017-11-18 DIAGNOSIS — C180 Malignant neoplasm of cecum: Secondary | ICD-10-CM | POA: Diagnosis not present

## 2017-11-18 DIAGNOSIS — R531 Weakness: Secondary | ICD-10-CM | POA: Diagnosis not present

## 2017-11-18 DIAGNOSIS — N390 Urinary tract infection, site not specified: Secondary | ICD-10-CM | POA: Diagnosis not present

## 2017-11-18 DIAGNOSIS — D519 Vitamin B12 deficiency anemia, unspecified: Secondary | ICD-10-CM | POA: Diagnosis not present

## 2017-11-18 NOTE — Telephone Encounter (Signed)
Contacted patient's home after 2 messages left asking if patient needs Iron infusion on Friday. They were told that if ferritin over 400, she would not need to come. Per Dr. Irene Limbo, patient does not need iron infusion on Friday.  Gave message to Daughter Jacqlyn Larsen. Appointments for Friday 11/20/17 cancelled.

## 2017-11-20 ENCOUNTER — Inpatient Hospital Stay: Payer: MEDICARE

## 2017-11-20 DIAGNOSIS — D519 Vitamin B12 deficiency anemia, unspecified: Secondary | ICD-10-CM | POA: Diagnosis not present

## 2017-11-20 DIAGNOSIS — C180 Malignant neoplasm of cecum: Secondary | ICD-10-CM | POA: Diagnosis not present

## 2017-11-20 DIAGNOSIS — D553 Anemia due to disorders of nucleotide metabolism: Secondary | ICD-10-CM | POA: Diagnosis not present

## 2017-11-20 DIAGNOSIS — I10 Essential (primary) hypertension: Secondary | ICD-10-CM | POA: Diagnosis not present

## 2017-11-20 DIAGNOSIS — R627 Adult failure to thrive: Secondary | ICD-10-CM | POA: Diagnosis not present

## 2017-11-20 DIAGNOSIS — N390 Urinary tract infection, site not specified: Secondary | ICD-10-CM | POA: Diagnosis not present

## 2017-11-23 DIAGNOSIS — R627 Adult failure to thrive: Secondary | ICD-10-CM | POA: Diagnosis not present

## 2017-11-23 DIAGNOSIS — N390 Urinary tract infection, site not specified: Secondary | ICD-10-CM | POA: Diagnosis not present

## 2017-11-23 DIAGNOSIS — C180 Malignant neoplasm of cecum: Secondary | ICD-10-CM | POA: Diagnosis not present

## 2017-11-23 DIAGNOSIS — D553 Anemia due to disorders of nucleotide metabolism: Secondary | ICD-10-CM | POA: Diagnosis not present

## 2017-11-23 DIAGNOSIS — I10 Essential (primary) hypertension: Secondary | ICD-10-CM | POA: Diagnosis not present

## 2017-11-23 DIAGNOSIS — D519 Vitamin B12 deficiency anemia, unspecified: Secondary | ICD-10-CM | POA: Diagnosis not present

## 2017-11-24 ENCOUNTER — Inpatient Hospital Stay: Payer: MEDICARE | Admitting: Nutrition

## 2017-11-24 NOTE — Progress Notes (Signed)
84 year old female diagnosed with colon cancer status post right hemicolectomy.  Past medical history includes hypertension and anemia.  Medications were reviewed.  Labs include glucose 103 and albumin 3.0 on October 4.  Height: 5 feet 4 inches. Weight: 150 pounds. Usual body weight: 155 pounds. BMI: 25.75.  Patient reports good appetite. She states she is healing well. Patient's husband is cooking for her. Dietary recall reveals patient is consuming a good breakfast lunch and dinner. She sometimes has an afternoon snack. Patient is trying to follow a low fiber diet. She is not drinking nutrition supplements. She has a good bowel movement every day and denies other nutrition impact symptoms.  Nutrition diagnosis: Food and nutrition related knowledge deficit related to colon cancer as evidenced by no prior need for nutrition related information.  Intervention: Patient educated to consume adequate calories and protein in smaller more frequent meals. Encourage patient to continue good water intake. Educated patient on low fiber diet and provided fact sheet. Questions were answered.  Teach back method used.  Contact information given.  Monitoring, evaluation, goals: Patient will tolerate adequate calories and protein to maintain current weight and promote healing.  She will advance diet per MD.  Next visit: Patient will call me with any questions or concerns.  **Disclaimer: This note was dictated with voice recognition software. Similar sounding words can inadvertently be transcribed and this note may contain transcription errors which may not have been corrected upon publication of note.**

## 2017-11-26 DIAGNOSIS — R627 Adult failure to thrive: Secondary | ICD-10-CM | POA: Diagnosis not present

## 2017-11-26 DIAGNOSIS — N390 Urinary tract infection, site not specified: Secondary | ICD-10-CM | POA: Diagnosis not present

## 2017-11-26 DIAGNOSIS — C180 Malignant neoplasm of cecum: Secondary | ICD-10-CM | POA: Diagnosis not present

## 2017-11-26 DIAGNOSIS — I10 Essential (primary) hypertension: Secondary | ICD-10-CM | POA: Diagnosis not present

## 2017-11-26 DIAGNOSIS — D519 Vitamin B12 deficiency anemia, unspecified: Secondary | ICD-10-CM | POA: Diagnosis not present

## 2017-11-26 DIAGNOSIS — D553 Anemia due to disorders of nucleotide metabolism: Secondary | ICD-10-CM | POA: Diagnosis not present

## 2017-11-30 DIAGNOSIS — C180 Malignant neoplasm of cecum: Secondary | ICD-10-CM | POA: Diagnosis not present

## 2017-11-30 DIAGNOSIS — I10 Essential (primary) hypertension: Secondary | ICD-10-CM | POA: Diagnosis not present

## 2017-11-30 DIAGNOSIS — R627 Adult failure to thrive: Secondary | ICD-10-CM | POA: Diagnosis not present

## 2017-11-30 DIAGNOSIS — N390 Urinary tract infection, site not specified: Secondary | ICD-10-CM | POA: Diagnosis not present

## 2017-11-30 DIAGNOSIS — D553 Anemia due to disorders of nucleotide metabolism: Secondary | ICD-10-CM | POA: Diagnosis not present

## 2017-11-30 DIAGNOSIS — D519 Vitamin B12 deficiency anemia, unspecified: Secondary | ICD-10-CM | POA: Diagnosis not present

## 2017-12-02 DIAGNOSIS — D519 Vitamin B12 deficiency anemia, unspecified: Secondary | ICD-10-CM | POA: Diagnosis not present

## 2017-12-02 DIAGNOSIS — D553 Anemia due to disorders of nucleotide metabolism: Secondary | ICD-10-CM | POA: Diagnosis not present

## 2017-12-02 DIAGNOSIS — C180 Malignant neoplasm of cecum: Secondary | ICD-10-CM | POA: Diagnosis not present

## 2017-12-02 DIAGNOSIS — N390 Urinary tract infection, site not specified: Secondary | ICD-10-CM | POA: Diagnosis not present

## 2017-12-02 DIAGNOSIS — I10 Essential (primary) hypertension: Secondary | ICD-10-CM | POA: Diagnosis not present

## 2017-12-02 DIAGNOSIS — R627 Adult failure to thrive: Secondary | ICD-10-CM | POA: Diagnosis not present

## 2017-12-07 DIAGNOSIS — D519 Vitamin B12 deficiency anemia, unspecified: Secondary | ICD-10-CM | POA: Diagnosis not present

## 2017-12-07 DIAGNOSIS — D553 Anemia due to disorders of nucleotide metabolism: Secondary | ICD-10-CM | POA: Diagnosis not present

## 2017-12-07 DIAGNOSIS — R627 Adult failure to thrive: Secondary | ICD-10-CM | POA: Diagnosis not present

## 2017-12-07 DIAGNOSIS — I10 Essential (primary) hypertension: Secondary | ICD-10-CM | POA: Diagnosis not present

## 2017-12-07 DIAGNOSIS — C180 Malignant neoplasm of cecum: Secondary | ICD-10-CM | POA: Diagnosis not present

## 2017-12-07 DIAGNOSIS — N390 Urinary tract infection, site not specified: Secondary | ICD-10-CM | POA: Diagnosis not present

## 2017-12-08 DIAGNOSIS — C180 Malignant neoplasm of cecum: Secondary | ICD-10-CM | POA: Diagnosis not present

## 2017-12-08 DIAGNOSIS — D553 Anemia due to disorders of nucleotide metabolism: Secondary | ICD-10-CM | POA: Diagnosis not present

## 2017-12-08 DIAGNOSIS — R627 Adult failure to thrive: Secondary | ICD-10-CM | POA: Diagnosis not present

## 2017-12-08 DIAGNOSIS — D519 Vitamin B12 deficiency anemia, unspecified: Secondary | ICD-10-CM | POA: Diagnosis not present

## 2017-12-08 DIAGNOSIS — I10 Essential (primary) hypertension: Secondary | ICD-10-CM | POA: Diagnosis not present

## 2017-12-08 DIAGNOSIS — N390 Urinary tract infection, site not specified: Secondary | ICD-10-CM | POA: Diagnosis not present

## 2017-12-09 DIAGNOSIS — D519 Vitamin B12 deficiency anemia, unspecified: Secondary | ICD-10-CM | POA: Diagnosis not present

## 2017-12-09 DIAGNOSIS — I10 Essential (primary) hypertension: Secondary | ICD-10-CM | POA: Diagnosis not present

## 2017-12-09 DIAGNOSIS — D553 Anemia due to disorders of nucleotide metabolism: Secondary | ICD-10-CM | POA: Diagnosis not present

## 2017-12-09 DIAGNOSIS — C180 Malignant neoplasm of cecum: Secondary | ICD-10-CM | POA: Diagnosis not present

## 2017-12-09 DIAGNOSIS — N390 Urinary tract infection, site not specified: Secondary | ICD-10-CM | POA: Diagnosis not present

## 2017-12-09 DIAGNOSIS — R627 Adult failure to thrive: Secondary | ICD-10-CM | POA: Diagnosis not present

## 2017-12-10 ENCOUNTER — Telehealth: Payer: Self-pay | Admitting: Hematology

## 2017-12-10 NOTE — Telephone Encounter (Signed)
Called pt re appts being moved due to [place] being on PAL. Spoke with patient and confirmed the appts.

## 2017-12-15 ENCOUNTER — Ambulatory Visit: Payer: MEDICARE | Admitting: Cardiology

## 2017-12-16 ENCOUNTER — Inpatient Hospital Stay: Payer: MEDICARE | Admitting: Hematology

## 2017-12-16 ENCOUNTER — Inpatient Hospital Stay: Payer: MEDICARE

## 2017-12-23 ENCOUNTER — Other Ambulatory Visit: Payer: MEDICARE

## 2017-12-23 ENCOUNTER — Ambulatory Visit: Payer: MEDICARE | Admitting: Hematology

## 2017-12-25 ENCOUNTER — Ambulatory Visit (HOSPITAL_COMMUNITY)
Admission: RE | Admit: 2017-12-25 | Discharge: 2017-12-25 | Disposition: A | Discharge status: Home / Self Care | Payer: MEDICARE | Source: Ambulatory Visit | Attending: Hematology | Admitting: Hematology

## 2017-12-25 DIAGNOSIS — I700 Atherosclerosis of aorta: Secondary | ICD-10-CM | POA: Diagnosis not present

## 2017-12-25 DIAGNOSIS — C189 Malignant neoplasm of colon, unspecified: Secondary | ICD-10-CM | POA: Diagnosis not present

## 2017-12-25 DIAGNOSIS — R918 Other nonspecific abnormal finding of lung field: Secondary | ICD-10-CM | POA: Insufficient documentation

## 2017-12-25 DIAGNOSIS — C180 Malignant neoplasm of cecum: Secondary | ICD-10-CM | POA: Diagnosis not present

## 2017-12-25 DIAGNOSIS — K449 Diaphragmatic hernia without obstruction or gangrene: Secondary | ICD-10-CM | POA: Diagnosis not present

## 2017-12-25 DIAGNOSIS — E042 Nontoxic multinodular goiter: Secondary | ICD-10-CM | POA: Insufficient documentation

## 2017-12-25 MED ORDER — SODIUM CHLORIDE (PF) 0.9 % IJ SOLN
INTRAMUSCULAR | Status: AC
  Filled 2017-12-25: qty 50

## 2017-12-25 MED ORDER — IOHEXOL 300 MG/ML  SOLN
100.0000 mL | Freq: Once | INTRAMUSCULAR | Status: AC | PRN
  Administered 2017-12-25: 100 mL via INTRAVENOUS

## 2017-12-29 ENCOUNTER — Ambulatory Visit: Payer: MEDICARE | Admitting: Cardiology

## 2017-12-29 NOTE — Progress Notes (Signed)
HEMATOLOGY/ONCOLOGY CLINIC NOTE  Date of Service:  12/30/17     Patient Care Team: Lawerance Cruel, MD as PCP - General Minus Breeding, MD as PCP - Cardiology (Cardiology) Excell Seltzer, MD as Consulting Physician (General Surgery) Richmond Campbell, MD as Consulting Physician (Gastroenterology) Minus Breeding, MD as Consulting Physician (Cardiology) Brunetta Genera, MD as Consulting Physician (Hematology)  CHIEF COMPLAINTS/PURPOSE OF CONSULTATION:  Iron deficiency Anemia and B12 deficiency Newly diagnosed colon cancer  HISTORY OF PRESENTING ILLNESS:   [name] [name] is a wonderful 84 y.o. female who has been referred to [place] by Dr .Harrington Challenger, Dwyane Luo, MD for evaluation and management of microcytic anemia.  Patient has a h/o HTN, atrial tachycardia, , benign tremor who was noted to have a new severe microcytic anemia and weakness with a drop of hgb down to 7.4 in sept 2017. She was admitted to the hospital and received 1 unit of PRBC. She was noted to have severe new iron deficiency and also noted to have B12 deficiency. Her stool occult blood was apparently neg. Was discharged on PO iron and was taking it for a few months. Rpt stool studies with PCP were again noted to be hemoccult neg. She received B12 IM x 1 in the hospital but has not been on any B12 since then. Patient had refused a GI workup in the hospital and was to be seen by Dr Fuller Plan for GI workup in the outpatient setting but has refused and continues to refuse and GI workup.  Denies any overt GI bleeding. No melena no hematemesis no hematochezia. No nausea/vomiting or abdominal pain. No significant weight loss recently.  Her hgb improved some on Po iron but then started dropping again and so she was referred to [place] for consideration of IV iron replacement.  On CXR in the hospital she was incidentally noted to have a moderate to large hiatal hernia.   INTERVAL HISTORY   [name] [name] is here for fu of  her newly diagnosed colon cancer. The patient's last visit with [place] was on 11/13/17. She is accompanied today by her husband and daughter. The pt reports that she is doing well overall.   The pt reports that she has continued to improve after surgery. She endorses improved energy levels, is eating better, and notes her surgical incision has healed well. She denies any abdominal pains, blood in the stools, or any constitutional symptoms.   Of note since the patient's last visit, pt has had a CT C/A/P completed on 12/25/17 with results revealing Stable appearance of scattered small pulmonary nodules within both lungs. These remain indeterminate. Recommend continued interval follow-up to ensure stability of these lesions. 2. No mass or adenopathy identified within the chest, abdomen or Pelvis. 3. Stable right adrenal nodule which may represent a benign adenoma. 4.  Aortic Atherosclerosis (ICD10-I70.0). 5. Large hiatal hernia 6. Bilateral thyroid nodules. Nodule in the left lobe demonstrated mild increased FDG uptake on previous PET-CT. Consider further evaluation with thyroid sonogram.  Lab results today (12/30/17) of CBC w/diff, CMP, and Reticulocytes is as follows: all values are WNL except for Glucose at 139, Calcium at 8.8, Alk Phos at 133, Total Bilirubin at 0.2. 12/30/17 Iron and TIBC revealed all values WNL 12/30/17 Ferritin at 246 12/30/17 Vitamin B12 is pending 12/30/17 CEA is pending  On review of systems, pt reports improved energy levels, eating better, healed surgical incision, weight gain, and denies blood in the stools, black stools, abdominal pains, fevers, chills,  night sweats, unexpected weight loss, and any other symptoms.   MEDICAL HISTORY:  Past Medical History:  Diagnosis Date  . Anemia   . Atrial tachycardia (HCC)   . Cancer (HCC)    colon  . CHICKENPOX, HX OF 09/07/2008   Qualifier: Diagnosis of  By: Leschber MD, Valerie A   . Cough    yellow to clear sputum cough for a  while per pt no fever  . HCAP (healthcare-associated pneumonia) 10/31/2015  . History of blood transfusion last transfusion 09-29-17  . HTN (hypertension)   . Jaundice due to hepatitis    at age 10 or 12  . Pneumonia 2014  . Transient global amnesia few yrs ago x 2   AMS  . Tremors of nervous system    benign familial  Large Hiatal hernia HCAP 10/2015 Iron deficiency Anemia B12 deficiency.  SURGICAL HISTORY: Past Surgical History:  Procedure Laterality Date  . ABDOMINAL HERNIA REPAIR  yrs ago  . BACK SURGERY     years ago lower  . LAPAROSCOPIC PARTIAL COLECTOMY N/A 11/04/2017   Procedure: LAPAROSCOPIC RIGHT HEMICOLECTOMY;  Surgeon: Hoxworth, Benjamin, MD;  Location: [facility];  Service: General;  Laterality: N/A;  . TONSILLECTOMY  age 4    SOCIAL HISTORY: Social History   Socioeconomic History  . Marital status: Married    Spouse name: Not on file  . Number of children: Not on file  . Years of education: Not on file  . Highest education level: Not on file  Occupational History  . Not on file  Social Needs  . Financial resource strain: Not on file  . Food insecurity:    Worry: Not on file    Inability: Not on file  . Transportation needs:    Medical: Not on file    Non-medical: Not on file  Tobacco Use  . Smoking status: Never Smoker  . Smokeless tobacco: Never Used  . Tobacco comment: + prior 2nd hand exposure from spouse smoking  Substance and Sexual Activity  . Alcohol use: No    Alcohol/week: 0.0 standard drinks  . Drug use: No  . Sexual activity: Never  Lifestyle  . Physical activity:    Days per week: Not on file    Minutes per session: Not on file  . Stress: Not on file  Relationships  . Social connections:    Talks on phone: Not on file    Gets together: Not on file    Attends religious service: Not on file    Active member of club or organization: Not on file    Attends meetings of clubs or organizations: Not on file    Relationship status: Not on  file  . Intimate partner violence:    Fear of current or ex partner: Not on file    Emotionally abused: Not on file    Physically abused: Not on file    Forced sexual activity: Not on file  Other Topics Concern  . Not on file  Social History Narrative  . Not on file    FAMILY HISTORY: Family History  Problem Relation Age of Onset  . Lung cancer Mother   . Parkinson's disease Mother   . Hypertension Mother   . Other Father        blood clot  . Arthritis Sister   . Other Sister        spinal stenosis    ALLERGIES:  is allergic to aspirin; codeine; hydrochlorothiazide; penicillins; sulfa antibiotics; sulfonamide derivatives;   biotin; levofloxacin; macrodantin [nitrofurantoin]; and latex.  MEDICATIONS:  Current Outpatient Medications  Medication Sig Dispense Refill  . amLODipine (NORVASC) 10 MG tablet TAKE 1 TABLET DAILY 90 tablet 1  . cloNIDine (CATAPRES) 0.2 MG tablet Take 2.5 tablets (0.5 mg total) by mouth daily. (Patient taking differently: Take 0.2-0.3 mg by mouth See admin instructions. Take 0.2 mg by mouth in the morning and take 0.3 mg by mouth at bedtime) 225 tablet 1   No current facility-administered medications for this visit.    Facility-Administered Medications Ordered in Other Visits  Medication Dose Route Frequency Provider Last Rate Last Dose  . clindamycin (CLEOCIN) 900 mg in dextrose 5 % 50 mL IVPB  900 mg Intravenous 60 min Pre-Op Excell Seltzer, MD        REVIEW OF SYSTEMS:    A 10+ POINT REVIEW OF SYSTEMS WAS OBTAINED including neurology, dermatology, psychiatry, cardiac, respiratory, lymph, extremities, GI, GU, Musculoskeletal, constitutional, breasts, reproductive, HEENT.  All pertinent positives are noted in the HPI.  All others are negative.   PHYSICAL EXAMINATION:  ECOG PERFORMANCE STATUS: 2 - Symptomatic, <50% confined to bed  Vitals:   12/30/17 1549  BP: (!) 159/68  Pulse: 68  Resp: 18  Temp: 97.8 F (36.6 C)  SpO2: 98%   Filed  Weights   12/30/17 1549  Weight: 155 lb (70.3 kg)   .Body mass index is 26.61 kg/m.  GENERAL:alert, in no acute distress and comfortable SKIN: no acute rashes, no significant lesions EYES: conjunctiva are pink and non-injected, sclera anicteric OROPHARYNX: MMM, no exudates, no oropharyngeal erythema or ulceration NECK: supple, no JVD LYMPH:  no palpable lymphadenopathy in the cervical, axillary or inguinal regions LUNGS: clear to auscultation b/l with normal respiratory effort HEART: regular rate & rhythm ABDOMEN:  normoactive bowel sounds , non tender, not distended. No palpable hepatosplenomegaly.  Extremity: 1+ pedal edema PSYCH: alert & oriented x 3 with fluent speech NEURO: no focal motor/sensory deficits   LABORATORY DATA:  I have reviewed the data as listed  . CBC Latest Ref Rng & Units 12/30/2017 11/13/2017 11/07/2017  WBC 4.0 - 10.5 K/uL 4.4 4.7 5.1  Hemoglobin 12.0 - 15.0 g/dL 13.8 11.3(L) 10.0(L)  Hematocrit 36.0 - 46.0 % 43.0 34.9 32.4(L)  Platelets 150 - 400 K/uL 248 375 362    . CMP Latest Ref Rng & Units 12/30/2017 11/13/2017 11/06/2017  Glucose 70 - 99 mg/dL 139(H) 103(H) 91  BUN 8 - 23 mg/dL 13 14 6(L)  Creatinine 0.44 - 1.00 mg/dL 0.78 0.69 0.55  Sodium 135 - 145 mmol/L 140 139 140  Potassium 3.5 - 5.1 mmol/L 3.7 4.2 4.4  Chloride 98 - 111 mmol/L 107 108 113(H)  CO2 22 - 32 mmol/L _0 Calcium 8.9 - 10.3 mg/dL 8.8(L) 8.3(L) 7.7(L)  Total Protein 6.5 - 8.1 g/dL 6.9 6.4(L) -  Total Bilirubin 0.3 - 1.2 mg/dL 0.2(L) <0.2(L) -  Alkaline Phos 38 - 126 U/L 133(H) 115 -  AST 15 - 41 U/L 15 12(L) -  ALT 0 - 44 U/L 11 11 -   . Lab Results  Component Value Date   IRON 86 12/30/2017   TIBC 268 12/30/2017   IRONPCTSAT 32 12/30/2017   (Iron and TIBC)  Lab Results  Component Value Date   FERRITIN 246 12/30/2017    Component     Latest Ref Rng & Units 05/08/2016  Parietal Cell Ab     0.0 - 20.0 Units 3.5  Intrinsic Factor Abs, Serum  0.0 - 1.1  AU/mL 0.9   11/06/17 Surgical Biopsy:     RADIOGRAPHIC STUDIES: I have personally reviewed the radiological images as listed and agreed with the findings in the report. Ct Chest W Contrast  Result Date: 12/25/2017 CLINICAL DATA:  Follow-up colon cancer. EXAM: CT CHEST, ABDOMEN, AND PELVIS WITH CONTRAST TECHNIQUE: Multidetector CT imaging of the chest, abdomen and pelvis was performed following the standard protocol during bolus administration of intravenous contrast. CONTRAST:  122m OMNIPAQUE IOHEXOL 300 MG/ML  SOLN COMPARISON:  07/01/2017 FINDINGS: CT CHEST FINDINGS Cardiovascular: Normal heart size. No pericardial effusion. Aortic atherosclerosis. Mediastinum/Nodes: Bilateral heterogeneous thyroid nodules are again noted. The trachea appears patent and is midline. Large hiatal hernia identified. No enlarged axillary or supraclavicular lymph nodes. No mediastinal or hilar adenopathy identified. Lungs/Pleura: No pleural effusion. Passive atelectasis identified within the medial aspect of both lower lobes. Small scattered pulmonary nodules are again noted. Index nodule within the anterior left upper lobe is stable measuring 5 mm, image 42/5. The index nodule in the superior segment of left lower lobe measures 4 mm, image 37/5. Unchanged. Index nodule in the posterior left lower lobe measures 5 mm and is stable, image 77/5. Stable 4 mm left lower lobe lung nodule, image 94/5. Posterior right lower lobe lung nodule is unchanged measuring 4 mm, image 87/5. 3 mm right upper lobe lung nodule is stable, image 61/5. Musculoskeletal: Osteopenia and thoracic spondylosis identified. No aggressive lytic or sclerotic bone lesions identified. T11 compression deformity is stable. CT ABDOMEN PELVIS FINDINGS Hepatobiliary: Within the medial segment of left lobe of liver there is a 6 mm hypodense lesion, image 61/2. Unchanged from 06/18/2017. No new or suspicious liver lesions identified. Gallbladder normal. No biliary  ductal dilatation identified. Pancreas: Unremarkable. No pancreatic ductal dilatation or surrounding inflammatory changes. Spleen: Normal in size without focal abnormality. Adrenals/Urinary Tract: Normal appearance of the left adrenal gland. Small low-density nodule in the right adrenal gland is unchanged. Left adrenal gland is normal. Similar appearance of right kidney cyst measuring 1.9 cm. No hydronephrosis identified. The urinary bladder appears normal. Stomach/Bowel: Large hiatal hernia containing at least 60% intrathoracic stomach. No abnormal dilatation of the large or small bowel loops. Status post right hemicolectomy with entero colonic anastomosis. Distal colonic diverticulosis identified without acute inflammation. Vascular/Lymphatic: Aortic atherosclerosis. No aneurysm. No adenopathy within the abdomen Reproductive: Uterus contains multiple calcified fibroids. No adnexal mass identified. Other: No ascites. No peritoneal nodularity or mass. Left inguinal hernia contains fat only. Musculoskeletal: No acute or significant osseous findings. IMPRESSION: 1. Stable appearance of scattered small pulmonary nodules within both lungs. These remain indeterminate. Recommend continued interval follow-up to ensure stability of these lesions. 2. No mass or adenopathy identified within the chest, abdomen or pelvis. 3. Stable right adrenal nodule which may represent a benign adenoma. 4.  Aortic Atherosclerosis (ICD10-I70.0). 5. Large hiatal hernia 6. Bilateral thyroid nodules. Nodule in the left lobe demonstrated mild increased FDG uptake on previous PET-CT. Consider further evaluation with thyroid sonogram. Electronically Signed   By: TKerby MoorsM.D.   On: 12/25/2017 15:57   Ct Abdomen Pelvis W Contrast  Result Date: 12/25/2017 CLINICAL DATA:  Follow-up colon cancer. EXAM: CT CHEST, ABDOMEN, AND PELVIS WITH CONTRAST TECHNIQUE: Multidetector CT imaging of the chest, abdomen and pelvis was performed following the  standard protocol during bolus administration of intravenous contrast. CONTRAST:  1041mOMNIPAQUE IOHEXOL 300 MG/ML  SOLN COMPARISON:  07/01/2017 FINDINGS: CT CHEST FINDINGS Cardiovascular: Normal heart size. No pericardial effusion. Aortic atherosclerosis.  Mediastinum/Nodes: Bilateral heterogeneous thyroid nodules are again noted. The trachea appears patent and is midline. Large hiatal hernia identified. No enlarged axillary or supraclavicular lymph nodes. No mediastinal or hilar adenopathy identified. Lungs/Pleura: No pleural effusion. Passive atelectasis identified within the medial aspect of both lower lobes. Small scattered pulmonary nodules are again noted. Index nodule within the anterior left upper lobe is stable measuring 5 mm, image 42/5. The index nodule in the superior segment of left lower lobe measures 4 mm, image 37/5. Unchanged. Index nodule in the posterior left lower lobe measures 5 mm and is stable, image 77/5. Stable 4 mm left lower lobe lung nodule, image 94/5. Posterior right lower lobe lung nodule is unchanged measuring 4 mm, image 87/5. 3 mm right upper lobe lung nodule is stable, image 61/5. Musculoskeletal: Osteopenia and thoracic spondylosis identified. No aggressive lytic or sclerotic bone lesions identified. T11 compression deformity is stable. CT ABDOMEN PELVIS FINDINGS Hepatobiliary: Within the medial segment of left lobe of liver there is a 6 mm hypodense lesion, image 61/2. Unchanged from 06/18/2017. No new or suspicious liver lesions identified. Gallbladder normal. No biliary ductal dilatation identified. Pancreas: Unremarkable. No pancreatic ductal dilatation or surrounding inflammatory changes. Spleen: Normal in size without focal abnormality. Adrenals/Urinary Tract: Normal appearance of the left adrenal gland. Small low-density nodule in the right adrenal gland is unchanged. Left adrenal gland is normal. Similar appearance of right kidney cyst measuring 1.9 cm. No hydronephrosis  identified. The urinary bladder appears normal. Stomach/Bowel: Large hiatal hernia containing at least 60% intrathoracic stomach. No abnormal dilatation of the large or small bowel loops. Status post right hemicolectomy with entero colonic anastomosis. Distal colonic diverticulosis identified without acute inflammation. Vascular/Lymphatic: Aortic atherosclerosis. No aneurysm. No adenopathy within the abdomen Reproductive: Uterus contains multiple calcified fibroids. No adnexal mass identified. Other: No ascites. No peritoneal nodularity or mass. Left inguinal hernia contains fat only. Musculoskeletal: No acute or significant osseous findings. IMPRESSION: 1. Stable appearance of scattered small pulmonary nodules within both lungs. These remain indeterminate. Recommend continued interval follow-up to ensure stability of these lesions. 2. No mass or adenopathy identified within the chest, abdomen or pelvis. 3. Stable right adrenal nodule which may represent a benign adenoma. 4.  Aortic Atherosclerosis (ICD10-I70.0). 5. Large hiatal hernia 6. Bilateral thyroid nodules. Nodule in the left lobe demonstrated mild increased FDG uptake on previous PET-CT. Consider further evaluation with thyroid sonogram. Electronically Signed   By: Taylor  Stroud M.D.   On: 12/25/2017 15:57    ASSESSMENT & PLAN:   84 y.o. caucasian female with   1) Severe Microcytic Anemia s/p PRBc transfusion in sept 2017.  This appears to be likely related to severe iron deficiency due to cecal adenocarcinoma  2) Severe Iron deficiency- due to chronic GI losses from cecal adenocarcinoma . Lab Results  Component Value Date   IRON 86 12/30/2017   TIBC 268 12/30/2017   IRONPCTSAT 32 12/30/2017   (Iron and TIBC)  Lab Results  Component Value Date   FERRITIN 246 12/30/2017   3) B12 deficiency - antiparietal cell and anti IF ab neg. B12 levels adequate with current replacement. B12 --- 375 PLAN -I discussed goal is to maintain  Ferritin > 100 and iron saturation at 20%.  -continue vit B complex 1 tab po daily to support accelerated hematopoiesis.  4. Recently diagnosed cecal adenocarcinoma - with evidence of local Lnadneopathy 07/01/17 PET revealed Hypermetabolic colon mass in the vicinity of the ileocecal valve, maximum SUV 17.9, compatible with malignancy.     11/04/17 Surgical pathology revealed 9.2cm invasive colorectal adenocarcinoma, clear margins, and three implicated lymph nodes in metastasis, with N1B status.    PLAN:  -Discussed with pt and family that staging is at least Stage III B -Discussed the possible treatment options based on staging -May be a role to back off of Clonidine, 10mg Amlodipine, 25mg Lopressor in post-surgical setting. Pt notes that she will discuss this further with her cardiologist.  -Continue home care PT/OT -Did refer pt to Nutritional therapist and Social worker's per pt and family request -Discussed pt labwork today, 12/30/17; blood counts have all normalized, chemistries are stable. Ferritin normal at 246.  -Discussed the 12/25/17 CT C/A/P which revealed  Stable appearance of scattered small pulmonary nodules within both lungs. These remain indeterminate. Recommend continued interval follow-up to ensure stability of these lesions. 2. No mass or adenopathy identified within the chest, abdomen or Pelvis. 3. Stable right adrenal nodule which may represent a benign adenoma. 4.  Aortic Atherosclerosis (ICD10-I70.0). 5. Large hiatal hernia 6. Bilateral thyroid nodules. Nodule in the left lobe demonstrated mild increased FDG uptake on previous PET-CT. Consider further evaluation with thyroid sonogram. -Pt is clinically doing well, her labs are improved, and most recent radiographic imaging is reassuring -Will repeat scans in 6 months   -Will see the pt back in 3 months   4)  . Patient Active Problem List   Diagnosis Date Noted  . Primary cancer of cecum pT3, pN1b s/p lap colectomy 11/04/2017  11/04/2017  . Chronic idiopathic constipation 04/27/2017  . Iron deficiency anemia 10/20/2015  . B12 deficiency anemia 10/20/2015  . Symptomatic anemia 10/19/2015  . UTI (urinary tract infection) 07/26/2014  . TGA (transient global amnesia) 07/25/2014  . Cardiac dysrhythmia 02/06/2010  . PALPITATIONS 02/06/2010  . Essential hypertension, benign 05/22/2008  . PSVT 03/27/2008  . OBESITY, UNSPECIFIED 03/26/2008  . Abnormal involuntary movement 03/26/2008  . AMNESIA, TRANSIENT GLOBAL 01/10/2007  -advised continued f/u with PCP for management of her other medical co-morbids   RTC with Dr Kale in 3 months with labs   All of the patients and multiple family members questions were answered with apparent satisfaction. The patient knows to call the clinic with any problems, questions or concerns.  The total time spent in the appt was 20 minutes and more than 50% was on counseling and direct patient cares.    Gautam Kale MD MS AAHIVMS SCH CTH Hematology/Oncology Physician [facility]  (Office):       [phone] (Work cell):  [phone] (Fax):           [phone]  I, Schuyler Bain, am acting as a scribe for Dr. Gautam Kale.   .I have reviewed the above documentation for accuracy and completeness, and I agree with the above. .Gautam Kishore Kale MD   

## 2017-12-30 ENCOUNTER — Telehealth: Payer: Self-pay | Admitting: Hematology

## 2017-12-30 ENCOUNTER — Inpatient Hospital Stay (HOSPITAL_BASED_OUTPATIENT_CLINIC_OR_DEPARTMENT_OTHER): Payer: MEDICARE | Admitting: Hematology

## 2017-12-30 ENCOUNTER — Inpatient Hospital Stay: Payer: MEDICARE | Attending: Hematology

## 2017-12-30 VITALS — BP 159/68 | HR 68 | Temp 97.8°F | Resp 18 | Ht 64.0 in | Wt 155.0 lb

## 2017-12-30 DIAGNOSIS — C180 Malignant neoplasm of cecum: Secondary | ICD-10-CM

## 2017-12-30 DIAGNOSIS — I10 Essential (primary) hypertension: Secondary | ICD-10-CM

## 2017-12-30 DIAGNOSIS — K5909 Other constipation: Secondary | ICD-10-CM | POA: Diagnosis not present

## 2017-12-30 DIAGNOSIS — I471 Supraventricular tachycardia: Secondary | ICD-10-CM

## 2017-12-30 DIAGNOSIS — K4090 Unilateral inguinal hernia, without obstruction or gangrene, not specified as recurrent: Secondary | ICD-10-CM | POA: Diagnosis not present

## 2017-12-30 DIAGNOSIS — R918 Other nonspecific abnormal finding of lung field: Secondary | ICD-10-CM | POA: Diagnosis not present

## 2017-12-30 DIAGNOSIS — E538 Deficiency of other specified B group vitamins: Secondary | ICD-10-CM

## 2017-12-30 DIAGNOSIS — D509 Iron deficiency anemia, unspecified: Secondary | ICD-10-CM

## 2017-12-30 DIAGNOSIS — E041 Nontoxic single thyroid nodule: Secondary | ICD-10-CM | POA: Insufficient documentation

## 2017-12-30 DIAGNOSIS — K449 Diaphragmatic hernia without obstruction or gangrene: Secondary | ICD-10-CM

## 2017-12-30 DIAGNOSIS — Z801 Family history of malignant neoplasm of trachea, bronchus and lung: Secondary | ICD-10-CM | POA: Insufficient documentation

## 2017-12-30 DIAGNOSIS — I700 Atherosclerosis of aorta: Secondary | ICD-10-CM

## 2017-12-30 DIAGNOSIS — N281 Cyst of kidney, acquired: Secondary | ICD-10-CM | POA: Insufficient documentation

## 2017-12-30 DIAGNOSIS — K5730 Diverticulosis of large intestine without perforation or abscess without bleeding: Secondary | ICD-10-CM | POA: Insufficient documentation

## 2017-12-30 DIAGNOSIS — M8580 Other specified disorders of bone density and structure, unspecified site: Secondary | ICD-10-CM

## 2017-12-30 DIAGNOSIS — Z79899 Other long term (current) drug therapy: Secondary | ICD-10-CM | POA: Insufficient documentation

## 2017-12-30 DIAGNOSIS — E042 Nontoxic multinodular goiter: Secondary | ICD-10-CM

## 2017-12-30 LAB — IRON AND TIBC
Iron: 86 ug/dL (ref 41–142)
SATURATION RATIOS: 32 % (ref 21–57)
TIBC: 268 ug/dL (ref 236–444)
UIBC: 181 ug/dL (ref 120–384)

## 2017-12-30 LAB — CBC WITH DIFFERENTIAL/PLATELET
ABS IMMATURE GRANULOCYTES: 0 10*3/uL (ref 0.00–0.07)
Basophils Absolute: 0 10*3/uL (ref 0.0–0.1)
Basophils Relative: 1 %
EOS PCT: 2 %
Eosinophils Absolute: 0.1 10*3/uL (ref 0.0–0.5)
HEMATOCRIT: 43 % (ref 36.0–46.0)
Hemoglobin: 13.8 g/dL (ref 12.0–15.0)
Immature Granulocytes: 0 %
LYMPHS ABS: 1.3 10*3/uL (ref 0.7–4.0)
LYMPHS PCT: 29 %
MCH: 28.5 pg (ref 26.0–34.0)
MCHC: 32.1 g/dL (ref 30.0–36.0)
MCV: 88.8 fL (ref 80.0–100.0)
MONO ABS: 0.4 10*3/uL (ref 0.1–1.0)
MONOS PCT: 10 %
NEUTROS ABS: 2.6 10*3/uL (ref 1.7–7.7)
Neutrophils Relative %: 58 %
Platelets: 248 10*3/uL (ref 150–400)
RBC: 4.84 MIL/uL (ref 3.87–5.11)
RDW: 14.3 % (ref 11.5–15.5)
WBC: 4.4 10*3/uL (ref 4.0–10.5)
nRBC: 0 % (ref 0.0–0.2)

## 2017-12-30 LAB — CMP (CANCER CENTER ONLY)
ALK PHOS: 133 U/L — AB (ref 38–126)
ALT: 11 U/L (ref 0–44)
ANION GAP: 8 (ref 5–15)
AST: 15 U/L (ref 15–41)
Albumin: 3.6 g/dL (ref 3.5–5.0)
BILIRUBIN TOTAL: 0.2 mg/dL — AB (ref 0.3–1.2)
BUN: 13 mg/dL (ref 8–23)
CALCIUM: 8.8 mg/dL — AB (ref 8.9–10.3)
CO2: 25 mmol/L (ref 22–32)
Chloride: 107 mmol/L (ref 98–111)
Creatinine: 0.78 mg/dL (ref 0.44–1.00)
GFR, Estimated: >60 mL/min (ref >60)
Glucose, Bld: 139 mg/dL — ABNORMAL HIGH (ref 70–99)
Potassium: 3.7 mmol/L (ref 3.5–5.1)
Sodium: 140 mmol/L (ref 135–145)
TOTAL PROTEIN: 6.9 g/dL (ref 6.5–8.1)

## 2017-12-30 LAB — CEA (IN HOUSE-CHCC): CEA (CHCC-IN HOUSE): 1.01 ng/mL (ref 0.00–5.00)

## 2017-12-30 LAB — VITAMIN B12: VITAMIN B 12: 254 pg/mL (ref 180–914)

## 2017-12-30 LAB — FERRITIN: Ferritin: 246 ng/mL (ref 11–307)

## 2017-12-30 NOTE — Telephone Encounter (Signed)
Gave pt avs and calendar  °

## 2017-12-31 ENCOUNTER — Encounter: Payer: Self-pay | Admitting: Podiatry

## 2017-12-31 ENCOUNTER — Encounter

## 2017-12-31 ENCOUNTER — Ambulatory Visit (INDEPENDENT_AMBULATORY_CARE_PROVIDER_SITE_OTHER): Payer: MEDICARE | Admitting: Podiatry

## 2017-12-31 VITALS — BP 142/76 | HR 83

## 2017-12-31 DIAGNOSIS — M79675 Pain in left toe(s): Secondary | ICD-10-CM

## 2017-12-31 DIAGNOSIS — M79674 Pain in right toe(s): Secondary | ICD-10-CM | POA: Diagnosis not present

## 2017-12-31 DIAGNOSIS — B351 Tinea unguium: Secondary | ICD-10-CM

## 2017-12-31 NOTE — Patient Instructions (Signed)

## 2018-01-18 ENCOUNTER — Encounter: Payer: Self-pay | Admitting: Podiatry

## 2018-01-18 NOTE — Progress Notes (Signed)
Subjective: [name] [name] presents today with painful, thick toenails 1-5 b/l that she cannot cut and which interfere with daily activities.  Pain is aggravated when wearing enclosed shoe gear.   Current Outpatient Medications:  .  amLODipine (NORVASC) 10 MG tablet, TAKE 1 TABLET DAILY, Disp: 90 tablet, Rfl: 1 .  cloNIDine (CATAPRES) 0.2 MG tablet, Take 2.5 tablets (0.5 mg total) by mouth daily. (Patient taking differently: Take 0.2-0.3 mg by mouth See admin instructions. Take 0.2 mg by mouth in the morning and take 0.3 mg by mouth at bedtime), Disp: 225 tablet, Rfl: 1 No current facility-administered medications for this visit.   Facility-Administered Medications Ordered in Other Visits:  .  clindamycin (CLEOCIN) 900 mg in dextrose 5 % 50 mL IVPB, 900 mg, Intravenous, 60 min Pre-Op **AND** [DISCONTINUED] gentamicin (GARAMYCIN) 5 mg/kg in dextrose 5 % 50 mL IVPB, 5 mg/kg, Intravenous, 60 min Pre-Op, Excell Seltzer, MD  Allergies  Allergen Reactions  . Aspirin Hives  . Codeine Other (See Comments)    REACTION: GI upset  . Hydrochlorothiazide Rash  . Penicillins Hives, Shortness Of Breath, Rash and Other (See Comments)    Has patient had a PCN reaction causing immediate rash, facial/tongue/throat swelling, SOB or lightheadedness with hypotension: yes Has patient had a PCN reaction causing severe rash involving mucus membranes or skin necrosis: unknown Has patient had a PCN reaction that required hospitalization : unknown Has patient had a PCN reaction occurring within the last 10 years: no If all of the above answers are "NO", then may proceed with Cephalosporin use.   . Sulfa Antibiotics Rash  . Sulfonamide Derivatives Rash  . Biotin Other (See Comments)    Mouth sores  . Levofloxacin Other (See Comments)    Joint aches and weakness  . Macrodantin [Nitrofurantoin] Other (See Comments)    Unknown reaction  . Latex Rash    Objective:  Vascular Examination: Capillary refill  time immediate x 10 digits Dorsalis pedis and Posterior tibial pulses palpable 1/4 b/l Digital hair present x 10 digits Skin temperature gradient WNL b/l  Dermatological Examination: Skin with normal turgor, texture and tone b/l  Toenails 1-5 b/l discolored, thick, dystrophic with subungual debris and pain with palpation to nailbeds due to thickness of nails.  Stasis dermatitis RLE  Musculoskeletal: Muscle strength 5/5 to all LE muscle groups HAV b/l Hammertoe deformity b/l 2-5   Neurological: Sensation intact with 10 gram monofilament. Vibratory sensation intact.  Assessment: Painful onychomycosis toenails 1-5 b/l   Plan: 1. Toenails 1-5 b/l were debrided in length and girth without iatrogenic bleeding. 2. Patient to continue soft, supportive shoe gear 3. Patient to report any pedal injuries to medical professional immediately. 4. Follow up 3 months. Patient/POA to call should there be a concern in the interim.

## 2018-01-27 NOTE — Progress Notes (Signed)
HPI The patient presents for follow up of HTN.  Since I last saw her she has been diagnosed with colon cancer and has had partial hemicolectomy.  Her HR has been running low.  She saw [place] in follow up and had her beta blocker was stopped.  She presents for follow up.  She is done relatively well since her surgery.  She did a little bit of physical therapy at home.  She gets around with a walker.  She brings me her blood pressure diary and her blood pressures have been very well controlled and typically in the 1 teens 259D systolic.  Heart rates in the 50s.  She is not having any presyncope or syncope.  She is not having any chest pressure, neck or arm discomfort.      Allergies  Allergen Reactions  . Aspirin Hives  . Codeine Other (See Comments)    REACTION: GI upset  . Hydrochlorothiazide Rash  . Penicillins Hives, Shortness Of Breath, Rash and Other (See Comments)    Has patient had a PCN reaction causing immediate rash, facial/tongue/throat swelling, SOB or lightheadedness with hypotension: yes Has patient had a PCN reaction causing severe rash involving mucus membranes or skin necrosis: unknown Has patient had a PCN reaction that required hospitalization : unknown Has patient had a PCN reaction occurring within the last 10 years: no If all of the above answers are "NO", then may proceed with Cephalosporin use.   . Sulfa Antibiotics Rash  . Sulfonamide Derivatives Rash  . Biotin Other (See Comments)    Mouth sores  . Levofloxacin Other (See Comments)    Joint aches and weakness  . Macrodantin [Nitrofurantoin] Other (See Comments)    Unknown reaction  . Latex Rash    Current Outpatient Medications  Medication Sig Dispense Refill  . amLODipine (NORVASC) 10 MG tablet TAKE 1 TABLET DAILY 90 tablet 1  . cloNIDine (CATAPRES) 0.2 MG tablet Take 1 tablet (0.2 mg total) by mouth 2 (two) times daily. 225 tablet 1   No current facility-administered medications for this visit.     Facility-Administered Medications Ordered in Other Visits  Medication Dose Route Frequency Provider Last Rate Last Dose  . clindamycin (CLEOCIN) 900 mg in dextrose 5 % 50 mL IVPB  900 mg Intravenous 60 min Pre-Op Excell Seltzer, MD        Past Medical History:  Diagnosis Date  . Anemia   . Atrial tachycardia ([place])   . Cancer ([place])    colon  . CHICKENPOX, HX OF 09/07/2008   Qualifier: Diagnosis of  By: Asa Lente MD, Jannifer Rodney Cough    yellow to clear sputum cough for a while per pt no fever  . HCAP (healthcare-associated pneumonia) 10/31/2015  . History of blood transfusion last transfusion 09-29-17  . HTN (hypertension)   . Jaundice due to hepatitis    at age 85 or 42  . Pneumonia 2014  . Transient global amnesia few yrs ago x 2   AMS  . Tremors of nervous system    benign familial    Past Surgical History:  Procedure Laterality Date  . ABDOMINAL HERNIA REPAIR  yrs ago  . BACK SURGERY     years ago lower  . LAPAROSCOPIC PARTIAL COLECTOMY N/A 11/04/2017   Procedure: LAPAROSCOPIC RIGHT HEMICOLECTOMY;  Surgeon: Excell Seltzer, MD;  Location: [facility];  Service: General;  Laterality: N/A;  . TONSILLECTOMY  age 21   ROS:   As stated  in the HPI and negative for all other systems.    PHYSICAL EXAM BP (!) 148/74   Pulse 83   Ht 5\' 3"  (1.6 m)   Wt 156 lb 6.4 oz (70.9 kg)   SpO2 96%   BMI 27.71 kg/m   PHYSICAL EXAM GEN: Somewhat frail appearing NECK:  No jugular venous distention at 90 degrees, waveform within normal limits, carotid upstroke brisk and symmetric, no bruits, no thyromegaly LYMPHATICS:  No cervical adenopathy LUNGS:  Clear to auscultation bilaterally BACK:  No CVA tenderness CHEST:  Unremarkable HEART:  S1 and S2 within normal limits, no S3, no S4, no clicks, no rubs, 2 out of 6 apical systolic murmur radiating slightly at the aortic outflow tract, no diastolic murmurs ABD:  Positive bowel sounds normal in frequency in pitch, no bruits, no rebound,  no guarding, unable to assess midline mass or bruit with the patient seated. EXT:  2 plus pulses throughout, moderate edema, no cyanosis no clubbing SKIN:  No rashes no nodules NEURO:  Cranial nerves II through XII grossly intact, motor grossly intact throughout, resting tremor PSYCH:  Cognitively intact, oriented to person place and time   EKG:  NA   ASSESSMENT AND PLAN   ESSENTIAL HYPERTENSION, BENIGN -  Her blood pressure is running lower than it used to.  She is having a hard time cutting her clonidine in half at night so I am just can reduce this to 1 tablet twice daily.  Surprisingly she still doing well with this medicine and not having any orthostatic symptoms or hypersomnolence.  It is the only one is really worked well for her in combination with the Norvasc.  She will continue this.   BRADYCARDIA:  Her heart rate is improved.  No change in therapy.  EDEMA:  This is mild.  No change in therapy.

## 2018-01-28 ENCOUNTER — Ambulatory Visit (INDEPENDENT_AMBULATORY_CARE_PROVIDER_SITE_OTHER): Payer: MEDICARE | Admitting: Cardiology

## 2018-01-28 ENCOUNTER — Encounter: Payer: Self-pay | Admitting: Cardiology

## 2018-01-28 ENCOUNTER — Telehealth: Payer: Self-pay | Admitting: Cardiology

## 2018-01-28 VITALS — BP 148/74 | HR 83 | Ht 63.0 in | Wt 156.4 lb

## 2018-01-28 DIAGNOSIS — I10 Essential (primary) hypertension: Secondary | ICD-10-CM

## 2018-01-28 DIAGNOSIS — R001 Bradycardia, unspecified: Secondary | ICD-10-CM

## 2018-01-28 DIAGNOSIS — M7989 Other specified soft tissue disorders: Secondary | ICD-10-CM | POA: Diagnosis not present

## 2018-01-28 MED ORDER — CLONIDINE HCL 0.2 MG PO TABS: 0.2000 mg | ORAL_TABLET | Freq: Two times a day (BID) | ORAL | 1 refills | Status: DC

## 2018-01-28 NOTE — Telephone Encounter (Signed)
New message    Pt daughter is calling stating that medication was called into [facility] when pt was here. They need it sent to [facility] and cancel prescription that was sent to [facility].    *STAT* If patient is at the pharmacy, call can be transferred to refill team.   1. Which medications need to be refilled? (please list name of each medication and dose if known) clonidine 0.2 mg  2. Which pharmacy/location (including street and city if local pharmacy) is medication to be sent to? [facility]  3. Do they need a 30 day or 90 day supply? 90 day

## 2018-01-28 NOTE — Patient Instructions (Signed)
Medication Instructions:  Decrease: Clonidine 0.2 mg (1 tablet) twice a day  If you need a refill on your cardiac medications before your next appointment, please call your pharmacy.   Lab work: None  Testing/Procedures: None  Follow-Up: At [facility], you and your health needs are our priority.  As part of our continuing mission to provide you with exceptional heart care, we have created designated Provider Care Teams.  These Care Teams include your primary Cardiologist (physician) and Advanced Practice Providers (APPs -  Physician Assistants and Nurse Practitioners) who all work together to provide you with the care you need, when you need it. You will need a follow up appointment in 6 months.  Please call our office 2 months in advance to schedule this appointment.  You may see Minus Breeding, MD or one of the following Advanced Practice Providers on your designated Care Team:   Rosaria Ferries, PA-C . Jory Sims, DNP, ANP

## 2018-02-23 NOTE — Telephone Encounter (Signed)
Close this encounter

## 2018-03-30 NOTE — Progress Notes (Signed)
HEMATOLOGY/ONCOLOGY CLINIC NOTE  Date of Service:  03/31/18     Patient Care Team: Lawerance Cruel, MD as PCP - General Minus Breeding, MD as PCP - Cardiology (Cardiology) Excell Seltzer, MD as Consulting Physician (General Surgery) Richmond Campbell, MD as Consulting Physician (Gastroenterology) Minus Breeding, MD as Consulting Physician (Cardiology) Brunetta Genera, MD as Consulting Physician (Hematology)  CHIEF COMPLAINTS/PURPOSE OF CONSULTATION:  Iron deficiency Anemia and B12 deficiency Newly diagnosed colon cancer  HISTORY OF PRESENTING ILLNESS:   [name] [name] is a wonderful 85 y.o. female who has been referred to [place] by Dr .Harrington Challenger, Dwyane Luo, MD for evaluation and management of microcytic anemia.  Patient has a h/o HTN, atrial tachycardia, , benign tremor who was noted to have a new severe microcytic anemia and weakness with a drop of hgb down to 7.4 in sept 2017. She was admitted to the hospital and received 1 unit of PRBC. She was noted to have severe new iron deficiency and also noted to have B12 deficiency. Her stool occult blood was apparently neg. Was discharged on PO iron and was taking it for a few months. Rpt stool studies with PCP were again noted to be hemoccult neg. She received B12 IM x 1 in the hospital but has not been on any B12 since then. Patient had refused a GI workup in the hospital and was to be seen by Dr Fuller Plan for GI workup in the outpatient setting but has refused and continues to refuse and GI workup.  Denies any overt GI bleeding. No melena no hematemesis no hematochezia. No nausea/vomiting or abdominal pain. No significant weight loss recently.  Her hgb improved some on Po iron but then started dropping again and so she was referred to [place] for consideration of IV iron replacement.  On CXR in the hospital she was incidentally noted to have a moderate to large hiatal hernia.   INTERVAL HISTORY   [name] [name] is here for fu of  her recently diagnosed colon cancer. The patient's last visit with [place] was on 12/30/17. She is accompanied today by her husband and daughter. The pt reports that she is doing well overall.   The pt reports that she has had a recent cough. She denies any abdominal pains or changes in her bowel habits. She notes that she is regularly moving her bowels, and denies any blood in the stools or black stools. She notes that she is eating well, but is eating less ice cream and chocolate than she used to. She has lost 4 pounds in the interim.  The pt notes that she noticed a small nodule appear on her left ear a couple months ago which was itchy, and then subsequently stopped itching. She notes that it has "gotten much better," after applying Sarna lotion.  The pt notes again that if she was seen to have progression at a later date, she would not prefer to have chemotherapy treatment.  Lab results today (03/31/18) of CBC w/diff and CMP is as follows: all values are WNL except for Glucose at 168, Alk Phos at 147. 03/31/18 Iron and TIBC revealed all values WNL 03/31/18 Ferritin at 187 03/31/18 Vitamin B12 is pending  On review of systems, pt reports recent cough, moving her bowels well, good energy levels, and denies abdominal pains, changes in bowel habits, blood in the stools, black stools, leg swelling, and any other symptoms.   MEDICAL HISTORY:  Past Medical History:  Diagnosis Date  .  Anemia   . Atrial tachycardia ([place])   . Cancer ([place])    colon  . CHICKENPOX, HX OF 09/07/2008   Qualifier: Diagnosis of  By: Asa Lente MD, Jannifer Rodney Cough    yellow to clear sputum cough for a while per pt no fever  . HCAP (healthcare-associated pneumonia) 10/31/2015  . History of blood transfusion last transfusion 09-29-17  . HTN (hypertension)   . Jaundice due to hepatitis    at age 53 or 49  . Pneumonia 2014  . Transient global amnesia few yrs ago x 2   AMS  . Tremors of nervous system    benign familial    Large Hiatal hernia HCAP 10/2015 Iron deficiency Anemia B12 deficiency.  SURGICAL HISTORY: Past Surgical History:  Procedure Laterality Date  . ABDOMINAL HERNIA REPAIR  yrs ago  . BACK SURGERY     years ago lower  . LAPAROSCOPIC PARTIAL COLECTOMY N/A 11/04/2017   Procedure: LAPAROSCOPIC RIGHT HEMICOLECTOMY;  Surgeon: Excell Seltzer, MD;  Location: [facility];  Service: General;  Laterality: N/A;  . TONSILLECTOMY  age 54    SOCIAL HISTORY: Social History   Socioeconomic History  . Marital status: Married    Spouse name: Not on file  . Number of children: Not on file  . Years of education: Not on file  . Highest education level: Not on file  Occupational History  . Not on file  Social Needs  . Financial resource strain: Not on file  . Food insecurity:    Worry: Not on file    Inability: Not on file  . Transportation needs:    Medical: Not on file    Non-medical: Not on file  Tobacco Use  . Smoking status: Never Smoker  . Smokeless tobacco: Never Used  . Tobacco comment: + prior 2nd hand exposure from spouse smoking  Substance and Sexual Activity  . Alcohol use: No    Alcohol/week: 0.0 standard drinks  . Drug use: No  . Sexual activity: Never  Lifestyle  . Physical activity:    Days per week: Not on file    Minutes per session: Not on file  . Stress: Not on file  Relationships  . Social connections:    Talks on phone: Not on file    Gets together: Not on file    Attends religious service: Not on file    Active member of club or organization: Not on file    Attends meetings of clubs or organizations: Not on file    Relationship status: Not on file  . Intimate partner violence:    Fear of current or ex partner: Not on file    Emotionally abused: Not on file    Physically abused: Not on file    Forced sexual activity: Not on file  Other Topics Concern  . Not on file  Social History Narrative  . Not on file    FAMILY HISTORY: Family History  Problem  Relation Age of Onset  . Lung cancer Mother   . Parkinson's disease Mother   . Hypertension Mother   . Other Father        blood clot  . Arthritis Sister   . Other Sister        spinal stenosis    ALLERGIES:  is allergic to aspirin; codeine; hydrochlorothiazide; penicillins; sulfa antibiotics; sulfonamide derivatives; biotin; levofloxacin; macrodantin [nitrofurantoin]; and latex.  MEDICATIONS:  Current Outpatient Medications  Medication Sig Dispense Refill  . amLODipine (NORVASC)  10 MG tablet TAKE 1 TABLET DAILY 90 tablet 1  . cloNIDine (CATAPRES) 0.2 MG tablet Take 1 tablet (0.2 mg total) by mouth 2 (two) times daily. 225 tablet 1   No current facility-administered medications for this visit.    Facility-Administered Medications Ordered in Other Visits  Medication Dose Route Frequency Provider Last Rate Last Dose  . clindamycin (CLEOCIN) 900 mg in dextrose 5 % 50 mL IVPB  900 mg Intravenous 60 min Pre-Op Excell Seltzer, MD        REVIEW OF SYSTEMS:    A 10+ POINT REVIEW OF SYSTEMS WAS OBTAINED including neurology, dermatology, psychiatry, cardiac, respiratory, lymph, extremities, GI, GU, Musculoskeletal, constitutional, breasts, reproductive, HEENT.  All pertinent positives are noted in the HPI.  All others are negative.   PHYSICAL EXAMINATION:  ECOG PERFORMANCE STATUS: 2 - Symptomatic, <50% confined to bed  Vitals:   03/31/18 1350  BP: (!) 156/72  Pulse: 63  Resp: 18  Temp: (!) 97.5 F (36.4 C)  SpO2: 99%   Filed Weights   03/31/18 1350  Weight: 152 lb 1.6 oz (69 kg)   .Body mass index is 26.94 kg/m.  GENERAL:alert, in no acute distress and comfortable SKIN: no acute rashes, no significant lesions EYES: conjunctiva are pink and non-injected, sclera anicteric OROPHARYNX: MMM, no exudates, no oropharyngeal erythema or ulceration NECK: supple, no JVD LYMPH:  no palpable lymphadenopathy in the cervical, axillary or inguinal regions LUNGS: clear to auscultation  b/l with normal respiratory effort HEART: regular rate & rhythm ABDOMEN:  normoactive bowel sounds , non tender, not distended. No palpable hepatosplenomegaly.  Extremity: 1+ pedal edema PSYCH: alert & oriented x 3 with fluent speech NEURO: no focal motor/sensory deficits   LABORATORY DATA:  I have reviewed the data as listed  . CBC Latest Ref Rng & Units 03/31/2018 12/30/2017 11/13/2017  WBC 4.0 - 10.5 K/uL 4.9 4.4 4.7  Hemoglobin 12.0 - 15.0 g/dL 14.3 13.8 11.3(L)  Hematocrit 36.0 - 46.0 % 43.9 43.0 34.9  Platelets 150 - 400 K/uL 279 248 375    . CMP Latest Ref Rng & Units 03/31/2018 12/30/2017 11/13/2017  Glucose 70 - 99 mg/dL 168(H) 139(H) 103(H)  BUN 8 - 23 mg/dL '12 13 14  ' Creatinine 0.44 - 1.00 mg/dL 0.73 0.78 0.69  Sodium 135 - 145 mmol/L 140 140 139  Potassium 3.5 - 5.1 mmol/L 4.1 3.7 4.2  Chloride 98 - 111 mmol/L 107 107 108  CO2 22 - 32 mmol/L '24 25 25  ' Calcium 8.9 - 10.3 mg/dL 9.1 8.8(L) 8.3(L)  Total Protein 6.5 - 8.1 g/dL 7.2 6.9 6.4(L)  Total Bilirubin 0.3 - 1.2 mg/dL 0.3 0.2(L) <0.2(L)  Alkaline Phos 38 - 126 U/L 147(H) 133(H) 115  AST 15 - 41 U/L 17 15 12(L)  ALT 0 - 44 U/L '13 11 11   ' . Lab Results  Component Value Date   IRON 101 03/31/2018   TIBC 311 03/31/2018   IRONPCTSAT 33 03/31/2018   (Iron and TIBC)  Lab Results  Component Value Date   FERRITIN 187 03/31/2018    Component     Latest Ref Rng & Units 05/08/2016  Parietal Cell Ab     0.0 - 20.0 Units 3.5  Intrinsic Factor Abs, Serum     0.0 - 1.1 AU/mL 0.9   11/06/17 Surgical Biopsy:     RADIOGRAPHIC STUDIES: I have personally reviewed the radiological images as listed and agreed with the findings in the report. No results found.  ASSESSMENT &  PLAN:   85 y.o. caucasian female with   1) Severe Microcytic Anemia s/p PRBc transfusion in sept 2017.  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 Lab Results  Component Value Date   IRON 101 03/31/2018   TIBC 311 03/31/2018   IRONPCTSAT 33 03/31/2018   (Iron and TIBC)  Lab Results  Component Value Date   FERRITIN 187 03/31/2018   3) B12 deficiency - antiparietal cell and anti IF ab neg. B12 levels adequate with current replacement. B12 --- 375 PLAN -I discussed goal is to maintain Ferritin > 100 and iron saturation at 20%.  -continue vit B complex 1 tab po daily to support accelerated hematopoiesis.  4. 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   11/04/17 Surgical pathology revealed 9.2cm invasive colorectal adenocarcinoma, clear margins, and three implicated lymph nodes in metastasis, with N1B status.    12/25/17 CT C/A/P revealed  Stable appearance of scattered small pulmonary nodules within both lungs. These remain indeterminate. Recommend continued interval follow-up to ensure stability of these lesions. 2. No mass or adenopathy identified within the chest, abdomen or Pelvis. 3. Stable right adrenal nodule which may represent a benign adenoma. 4.  Aortic Atherosclerosis (ICD10-I70.0). 5. Large hiatal hernia 6. Bilateral thyroid nodules. Nodule in the left lobe demonstrated mild increased FDG uptake on previous PET-CT. Consider further evaluation with thyroid sonogram.  PLAN:  -Discussed pt labwork today, 03/31/18; all blood counts are normal including HGB normal at 14.3, Ferritin at 187, and Vitamin B12 is 254  -recommended patient start B12 SL OTC 1000 mcg daiily -Last available 12/30/17 CEA was normal at 1.01, however her CEA was not previously elevated prior to resection -As the pt notes that she would not like to pursue chemotherapy if a future routine scan revealed progression, I discussed my  recommendation to have less frequent scans if it won't change what we do, which she agrees with. However, she would like to complete one last routine scan in 16 weeks, and then repeat scans as directed by new symptoms -Regarding improving left ear nodularity without ulceration, recommend keeping dermatology appointment and recommend Lotrisone -May be a role to back off of Clonidine, 27m Amlodipine, 215mLopressor in post-surgical setting. Pt notes that she will discuss this further with her cardiologist.  -Continue home care PT/OT -Did refer pt to Nutritional therapist and Social worker's per pt and family request -Will see the pt back in 4 months   4)  . Patient Active Problem List   Diagnosis Date Noted  . Leg swelling 01/28/2018  . Bradycardia 01/28/2018  . Primary cancer of cecum pT3, pN1b s/p lap colectomy 11/04/2017 11/04/2017  . Chronic idiopathic constipation 04/27/2017  . Iron deficiency anemia 10/20/2015  . B12 deficiency anemia 10/20/2015  . Symptomatic anemia 10/19/2015  . Allergy 08/17/2015  . UTI (urinary tract infection) 07/26/2014  . TGA (transient global amnesia) 07/25/2014  . Cardiac dysrhythmia 02/06/2010  . PALPITATIONS 02/06/2010  . Essential hypertension, benign 05/22/2008  . PSVT 03/27/2008  . OBESITY, UNSPECIFIED 03/26/2008  . Abnormal involuntary movement 03/26/2008  . AMNESIA, TRANSIENT GLOBAL 01/10/2007  -advised continued f/u with PCP for management of her other medical co-morbids   Ct chest/abd/pelvis in 16 weeks with labs  RTC with Dr Irene Limbo in 4 months    All of the patients and multiple family members questions were answered with apparent satisfaction. The patient knows to call the clinic with any problems, questions or concerns.  The total time spent in the appt was 25 minutes and more than 50% was on counseling and direct patient cares.   Sullivan Lone MD [place] AAHIVMS [facility] [place] [facility] Hematology/Oncology Physician [facility]  (Office):        [phone] (Work cell):  [phone] (Fax):           [phone]  I, Baldwin [place], am acting as a scribe for Dr. Sullivan Lone.   .I have reviewed the above documentation for accuracy and completeness, and I agree with the above. Brunetta Genera MD

## 2018-03-31 ENCOUNTER — Inpatient Hospital Stay (HOSPITAL_BASED_OUTPATIENT_CLINIC_OR_DEPARTMENT_OTHER): Payer: MEDICARE | Admitting: Hematology

## 2018-03-31 ENCOUNTER — Inpatient Hospital Stay: Payer: MEDICARE | Attending: Hematology

## 2018-03-31 ENCOUNTER — Telehealth: Payer: Self-pay | Admitting: Hematology

## 2018-03-31 VITALS — BP 156/72 | HR 63 | Temp 97.5°F | Resp 18 | Ht 63.0 in | Wt 152.1 lb

## 2018-03-31 DIAGNOSIS — C180 Malignant neoplasm of cecum: Secondary | ICD-10-CM | POA: Diagnosis not present

## 2018-03-31 DIAGNOSIS — I700 Atherosclerosis of aorta: Secondary | ICD-10-CM | POA: Diagnosis not present

## 2018-03-31 DIAGNOSIS — I10 Essential (primary) hypertension: Secondary | ICD-10-CM | POA: Insufficient documentation

## 2018-03-31 DIAGNOSIS — E538 Deficiency of other specified B group vitamins: Secondary | ICD-10-CM | POA: Diagnosis not present

## 2018-03-31 DIAGNOSIS — K449 Diaphragmatic hernia without obstruction or gangrene: Secondary | ICD-10-CM

## 2018-03-31 DIAGNOSIS — R000 Tachycardia, unspecified: Secondary | ICD-10-CM

## 2018-03-31 DIAGNOSIS — D509 Iron deficiency anemia, unspecified: Secondary | ICD-10-CM | POA: Insufficient documentation

## 2018-03-31 LAB — CMP (CANCER CENTER ONLY)
ALT: 13 U/L (ref 0–44)
AST: 17 U/L (ref 15–41)
Albumin: 3.9 g/dL (ref 3.5–5.0)
Alkaline Phosphatase: 147 U/L — ABNORMAL HIGH (ref 38–126)
Anion gap: 9 (ref 5–15)
BUN: 12 mg/dL (ref 8–23)
CO2: 24 mmol/L (ref 22–32)
Calcium: 9.1 mg/dL (ref 8.9–10.3)
Chloride: 107 mmol/L (ref 98–111)
Creatinine: 0.73 mg/dL (ref 0.44–1.00)
GFR, Est AFR Am: >60 mL/min (ref >60)
GFR, Estimated: >60 mL/min (ref >60)
Glucose, Bld: 168 mg/dL — ABNORMAL HIGH (ref 70–99)
Potassium: 4.1 mmol/L (ref 3.5–5.1)
SODIUM: 140 mmol/L (ref 135–145)
Total Bilirubin: 0.3 mg/dL (ref 0.3–1.2)
Total Protein: 7.2 g/dL (ref 6.5–8.1)

## 2018-03-31 LAB — CBC WITH DIFFERENTIAL/PLATELET
Abs Immature Granulocytes: 0.01 10*3/uL (ref 0.00–0.07)
Basophils Absolute: 0 10*3/uL (ref 0.0–0.1)
Basophils Relative: 1 %
Eosinophils Absolute: 0.1 10*3/uL (ref 0.0–0.5)
Eosinophils Relative: 1 %
HCT: 43.9 % (ref 36.0–46.0)
Hemoglobin: 14.3 g/dL (ref 12.0–15.0)
Immature Granulocytes: 0 %
Lymphocytes Relative: 26 %
Lymphs Abs: 1.3 10*3/uL (ref 0.7–4.0)
MCH: 29.8 pg (ref 26.0–34.0)
MCHC: 32.6 g/dL (ref 30.0–36.0)
MCV: 91.5 fL (ref 80.0–100.0)
Monocytes Absolute: 0.5 10*3/uL (ref 0.1–1.0)
Monocytes Relative: 9 %
Neutro Abs: 3.1 10*3/uL (ref 1.7–7.7)
Neutrophils Relative %: 63 %
Platelets: 279 10*3/uL (ref 150–400)
RBC: 4.8 MIL/uL (ref 3.87–5.11)
RDW: 13.2 % (ref 11.5–15.5)
WBC: 4.9 10*3/uL (ref 4.0–10.5)
nRBC: 0 % (ref 0.0–0.2)

## 2018-03-31 LAB — IRON AND TIBC
Iron: 101 ug/dL (ref 41–142)
Saturation Ratios: 33 % (ref 21–57)
TIBC: 311 ug/dL (ref 236–444)
UIBC: 210 ug/dL (ref 120–384)

## 2018-03-31 LAB — VITAMIN B12: Vitamin B-12: 256 pg/mL (ref 180–914)

## 2018-03-31 LAB — FERRITIN: Ferritin: 187 ng/mL (ref 11–307)

## 2018-03-31 NOTE — Telephone Encounter (Signed)
Gave avs, calendar, and contrast ° °

## 2018-03-31 NOTE — Patient Instructions (Signed)
Thank you for choosing [facility] to provide your oncology and hematology care.  To afford each patient quality time with our providers, please arrive 30 minutes before your scheduled appointment time.  If you arrive late for your appointment, you may be asked to reschedule.  We strive to give you quality time with our providers, and arriving late affects you and other patients whose appointments are after yours.    If you are a no show for multiple scheduled visits, you may be dismissed from the clinic at the providers discretion.     Again, thank you for choosing [facility], our hope is that these requests will decrease the amount of time that you wait before being seen by our physicians.  ______________________________________________________________________   Should you have questions after your visit to the [facility], please contact our office at [phone] between the hours of 8:30 and 4:30 p.m.    Voicemails left after 4:30p.m will not be returned until the following business day.     For prescription refill requests, please have your pharmacy contact us directly.  Please also try to allow 48 hours for prescription requests.     Please contact the scheduling department for questions regarding scheduling.  For scheduling of procedures such as PET scans, CT scans, MRI, Ultrasound, etc please contact central scheduling at [phone].     Resources For Cancer Patients and Caregivers:    Oncolink.org:  A wonderful resource for patients and healthcare providers for information regarding your disease, ways to tract your treatment, what to expect, etc.      American Cancer Society:  800-227-2345  Can help patients locate various types of support and financial assistance   Cancer Care: 1-800-813-HOPE (4673) Provides financial assistance, online support groups, medication/co-pay assistance.     [place] DSS:  336-641-3447 Where to apply  for food stamps, Medicaid, and utility assistance   Medicare Rights Center: 800-333-4114 Helps people with Medicare understand their rights and benefits, navigate the Medicare system, and secure the quality healthcare they deserve   SCAT: 336-333-6589 [place] [employer]'s shared-ride transportation service for eligible riders who have a disability that prevents them from riding the fixed route bus.     For additional information on assistance programs please contact our social worker:   Abigail Elmore:  [phone]  

## 2018-04-01 ENCOUNTER — Ambulatory Visit (INDEPENDENT_AMBULATORY_CARE_PROVIDER_SITE_OTHER): Payer: MEDICARE | Admitting: Podiatry

## 2018-04-01 ENCOUNTER — Other Ambulatory Visit: Payer: Self-pay

## 2018-04-01 DIAGNOSIS — B351 Tinea unguium: Secondary | ICD-10-CM

## 2018-04-01 DIAGNOSIS — M79674 Pain in right toe(s): Secondary | ICD-10-CM

## 2018-04-01 DIAGNOSIS — M79675 Pain in left toe(s): Secondary | ICD-10-CM | POA: Diagnosis not present

## 2018-04-01 NOTE — Patient Instructions (Signed)

## 2018-04-07 ENCOUNTER — Telehealth: Payer: Self-pay | Admitting: *Deleted

## 2018-04-07 NOTE — Telephone Encounter (Signed)
Contacted patient per Dr. Grier Mitts direction with lab results:Please let patient know her B12 levels are low and would recommend OTC SL B12 1094mcg daily. Patient verbalized understanding

## 2018-04-10 ENCOUNTER — Encounter: Payer: Self-pay | Admitting: Podiatry

## 2018-04-10 NOTE — Progress Notes (Signed)
Subjective: [name] [name] presents today with painful, thick toenails 1-5 b/l that she cannot cut and which interfere with daily activities.  Pain is aggravated when wearing enclosed shoe gear.  Patient states she needs to be sooner than 3 months as her left great toenail grows into the corners of her toe and she did attempt to cut it herself, but is afraid she will hurt herself.  Lawerance Cruel, MD is her PCP.    Current Outpatient Medications:  .  amLODipine (NORVASC) 10 MG tablet, TAKE 1 TABLET DAILY, Disp: 90 tablet, Rfl: 1 .  cloNIDine (CATAPRES) 0.2 MG tablet, Take 1 tablet (0.2 mg total) by mouth 2 (two) times daily., Disp: 225 tablet, Rfl: 1 .  MAGNESIUM CITRATE PO, Take by mouth., Disp: , Rfl:  No current facility-administered medications for this visit.   Facility-Administered Medications Ordered in Other Visits:  .  clindamycin (CLEOCIN) 900 mg in dextrose 5 % 50 mL IVPB, 900 mg, Intravenous, 60 min Pre-Op **AND** [DISCONTINUED] gentamicin (GARAMYCIN) 5 mg/kg in dextrose 5 % 50 mL IVPB, 5 mg/kg, Intravenous, 60 min Pre-Op, Excell Seltzer, MD  Allergies  Allergen Reactions  . Aspirin Hives  . Codeine Other (See Comments)    REACTION: GI upset  . Hydrochlorothiazide Rash  . Penicillins Hives, Shortness Of Breath, Rash and Other (See Comments)    Has patient had a PCN reaction causing immediate rash, facial/tongue/throat swelling, SOB or lightheadedness with hypotension: yes Has patient had a PCN reaction causing severe rash involving mucus membranes or skin necrosis: unknown Has patient had a PCN reaction that required hospitalization : unknown Has patient had a PCN reaction occurring within the last 10 years: no If all of the above answers are "NO", then may proceed with Cephalosporin use.   . Sulfa Antibiotics Rash  . Sulfonamide Derivatives Rash  . Biotin Other (See Comments)    Mouth sores  . Levofloxacin Other (See Comments)    Joint aches and weakness  .  Macrodantin [Nitrofurantoin] Other (See Comments)    Unknown reaction  . Latex Rash    Objective:  Vascular Examination: Capillary refill time immediate x 10 digits  Dorsalis pedis and Posterior tibial pulses 1/4 b/l  Digital hair present x 10 digits  Skin temperature gradient WNL b/l  Dermatological Examination: Skin with normal turgor, texture and tone b/l  Toenails 1-5 b/l discolored, thick, dystrophic with subungual debris and pain with palpation to nailbeds due to thickness of nails.  Stasis dermatitis RLE  Musculoskeletal: Muscle strength 5/5 to all LE muscle groups b/l  HAV b/l feet  Hammertoe deformity 2-5 b/l  No pain, crepitus or joint limitation noted with ROM.   Neurological: Sensation intact with 10 gram monofilament  Vibratory sensation intact  Assessment: Painful onychomycosis toenails 1-5 b/l   Plan: 1. Toenails 1-5 b/l were debrided in length and girth without iatrogenic bleeding. 2. Patient to continue soft, supportive shoe gear 3. Patient to report any pedal injuries to medical professional immediately. 4. We will see her sooner per her request, yet keep her in eligibility for Medicare. Follow up 9 weeks.  5. Patient/POA to call should there be a concern in the interim.

## 2018-04-29 ENCOUNTER — Other Ambulatory Visit: Payer: Self-pay | Admitting: Cardiology

## 2018-06-09 ENCOUNTER — Ambulatory Visit: Payer: MEDICARE | Admitting: Podiatry

## 2018-07-21 ENCOUNTER — Other Ambulatory Visit: Payer: MEDICARE

## 2018-07-21 ENCOUNTER — Ambulatory Visit (HOSPITAL_COMMUNITY): Admission: RE | Admit: 2018-07-21 | Payer: MEDICARE | Source: Ambulatory Visit

## 2018-07-29 ENCOUNTER — Ambulatory Visit: Payer: MEDICARE | Admitting: Hematology

## 2018-07-29 ENCOUNTER — Other Ambulatory Visit: Payer: MEDICARE

## 2018-08-06 ENCOUNTER — Ambulatory Visit (INDEPENDENT_AMBULATORY_CARE_PROVIDER_SITE_OTHER): Payer: MEDICARE | Admitting: Podiatry

## 2018-08-06 ENCOUNTER — Encounter: Payer: Self-pay | Admitting: Podiatry

## 2018-08-06 ENCOUNTER — Other Ambulatory Visit: Payer: Self-pay

## 2018-08-06 VITALS — Temp 97.9°F

## 2018-08-06 DIAGNOSIS — M79675 Pain in left toe(s): Secondary | ICD-10-CM

## 2018-08-06 DIAGNOSIS — M79674 Pain in right toe(s): Secondary | ICD-10-CM

## 2018-08-06 DIAGNOSIS — B351 Tinea unguium: Secondary | ICD-10-CM | POA: Diagnosis not present

## 2018-08-06 NOTE — Patient Instructions (Signed)

## 2018-08-14 NOTE — Progress Notes (Signed)
Subjective: [name] [name] presents to clinic with cc of painful mycotic toenails  which are aggravated with enclosed shoe gear.  This pain limits her daily activities. Pain symptoms resolve with periodic professional debridement.  Lawerance Cruel, MD is her PCP.   Current Outpatient Medications:  .  amLODipine (NORVASC) 10 MG tablet, TAKE 1 TABLET DAILY, Disp: 90 tablet, Rfl: 2 .  cloNIDine (CATAPRES) 0.2 MG tablet, Take 1 tablet (0.2 mg total) by mouth 2 (two) times daily., Disp: 225 tablet, Rfl: 1 .  MAGNESIUM CITRATE PO, Take by mouth., Disp: , Rfl:  No current facility-administered medications for this visit.   Facility-Administered Medications Ordered in Other Visits:  .  clindamycin (CLEOCIN) 900 mg in dextrose 5 % 50 mL IVPB, 900 mg, Intravenous, 60 min Pre-Op **AND** [DISCONTINUED] gentamicin (GARAMYCIN) 5 mg/kg in dextrose 5 % 50 mL IVPB, 5 mg/kg, Intravenous, 60 min Pre-Op, Excell Seltzer, MD   Allergies  Allergen Reactions  . Aspirin Hives  . Codeine Other (See Comments)    REACTION: GI upset  . Hydrochlorothiazide Rash  . Penicillins Hives, Shortness Of Breath, Rash and Other (See Comments)    Has patient had a PCN reaction causing immediate rash, facial/tongue/throat swelling, SOB or lightheadedness with hypotension: yes Has patient had a PCN reaction causing severe rash involving mucus membranes or skin necrosis: unknown Has patient had a PCN reaction that required hospitalization : unknown Has patient had a PCN reaction occurring within the last 10 years: no If all of the above answers are "NO", then may proceed with Cephalosporin use.   . Sulfa Antibiotics Rash  . Sulfonamide Derivatives Rash  . Biotin Other (See Comments)    Mouth sores  . Levofloxacin Other (See Comments)    Joint aches and weakness  . Macrodantin [Nitrofurantoin] Other (See Comments)    Unknown reaction  . Latex Rash     Objective: Vitals:   08/06/18 1407  Temp: 97.9 F (36.6 C)     Physical Examination:  Vascular  Examination: Capillary refill time immediate x 10 digits.  DP/PT pulses 1/4 b/l.  Digital hair present x 10 digits b/l.  No edema noted b/l.  Skin temperature gradient WNL b/l.  Dermatological Examination: Skin with normal turgor, texture and tone b/l.  No open wounds b/l.  No interdigital macerations noted b/l.  Elongated, thick, discolored brittle toenails with subungual debris and pain on dorsal palpation of nailbeds 1-5 b/l.  Musculoskeletal Examination: Muscle strength 5/5 to all muscle groups b/l.  HAV with bunion b/l.  Hammertoe deformity 2-5 b/l.  No pain, crepitus or joint discomfort with active/passive ROM.  Neurological Examination: Sensation intact 5/5 b/l with 10 gram monofilament.  Vibratory sensation intact b/l.  Proprioceptive sensation intact b/l.  Assessment: 1. Mycotic nail infection with pain 1-5 b/l  Plan: 1. Toenails 1-5 b/l were debrided in length and girth without iatrogenic laceration. 2. Continue soft, supportive shoe gear daily. 3. Report any pedal injuries to medical professional. 4. Follow up 3 months. 5. Patient/POA to call should there be a question/concern in there interim.

## 2018-09-26 NOTE — Progress Notes (Signed)
HPI The patient presents for follow up of HTN.   When I last saw her her BPs were running low.  Since I last saw her she has done well.  She walks everyday in her driveway for exercise with a walker.  The patient denies any new symptoms such as chest discomfort, neck or arm discomfort. There has been no new shortness of breath, PND or orthopnea. There have been no reported palpitations, presyncope or syncope.  Her BPs that she brings today are between 125/64 - 148/66   Allergies  Allergen Reactions  . Aspirin Hives  . Codeine Other (See Comments)    REACTION: GI upset  . Hydrochlorothiazide Rash  . Penicillins Hives, Shortness Of Breath, Rash and Other (See Comments)    Has patient had a PCN reaction causing immediate rash, facial/tongue/throat swelling, SOB or lightheadedness with hypotension: yes Has patient had a PCN reaction causing severe rash involving mucus membranes or skin necrosis: unknown Has patient had a PCN reaction that required hospitalization : unknown Has patient had a PCN reaction occurring within the last 10 years: no If all of the above answers are "NO", then may proceed with Cephalosporin use.   . Sulfa Antibiotics Rash  . Sulfonamide Derivatives Rash  . Biotin Other (See Comments)    Mouth sores  . Levofloxacin Other (See Comments)    Joint aches and weakness  . Macrodantin [Nitrofurantoin] Other (See Comments)    Unknown reaction  . Latex Rash    Current Outpatient Medications  Medication Sig Dispense Refill  . amLODipine (NORVASC) 10 MG tablet TAKE 1 TABLET DAILY 90 tablet 2  . cloNIDine (CATAPRES) 0.2 MG tablet Take 1 tablet (0.2 mg total) by mouth 2 (two) times daily. 225 tablet 1  . MAGNESIUM CITRATE PO Take by mouth.     No current facility-administered medications for this visit.    Facility-Administered Medications Ordered in Other Visits  Medication Dose Route Frequency Provider Last Rate Last Dose  . clindamycin (CLEOCIN) 900 mg in  dextrose 5 % 50 mL IVPB  900 mg Intravenous 60 min Pre-Op Excell Seltzer, MD        Past Medical History:  Diagnosis Date  . Anemia   . Atrial tachycardia ([place])   . Cancer ([place])    colon  . CHICKENPOX, HX OF 09/07/2008   Qualifier: Diagnosis of  By: Asa Lente MD, Jannifer Rodney Cough    yellow to clear sputum cough for a while per pt no fever  . HCAP (healthcare-associated pneumonia) 10/31/2015  . History of blood transfusion last transfusion 09-29-17  . HTN (hypertension)   . Jaundice due to hepatitis    at age 85 or 82  . Pneumonia 2014  . Transient global amnesia few yrs ago x 2   AMS  . Tremors of nervous system    benign familial    Past Surgical History:  Procedure Laterality Date  . ABDOMINAL HERNIA REPAIR  yrs ago  . BACK SURGERY     years ago lower  . LAPAROSCOPIC PARTIAL COLECTOMY N/A 11/04/2017   Procedure: LAPAROSCOPIC RIGHT HEMICOLECTOMY;  Surgeon: Excell Seltzer, MD;  Location: [facility];  Service: General;  Laterality: N/A;  . TONSILLECTOMY  age 81   ROS:   As stated in the HPI and negative for all other systems.  PHYSICAL EXAM BP (!) 172/79   Pulse 74   Temp (!) 97.2 F (36.2 C)   Wt 161 lb (73 kg)  SpO2 96%   BMI 28.52 kg/m   GEN:  No distress NECK:  No jugular venous distention at 90 degrees, waveform within normal limits, carotid upstroke brisk and symmetric, no bruits, no thyromegaly LUNGS:  Clear to auscultation bilaterally BACK:  No CVA tenderness CHEST:  Unremarkable HEART:  S1 and S2 within normal limits, no S3, no S4, no clicks, no rubs, no murmurs ABD:  Positive bowel sounds normal in frequency in pitch, no bruits, no rebound, no guarding, unable to assess midline mass or bruit with the patient seated. EXT:  2 plus pulses throughout, moderate edema, no cyanosis no clubbing SKIN:  No rashes no nodules NEURO:  Cranial nerves II through XII grossly intact, motor grossly intact throughout PSYCH:  Cognitively intact, oriented to person place  and time   EKG:  NA    ASSESSMENT AND PLAN   ESSENTIAL HYPERTENSION, BENIGN -  Her blood pressure is controlled.  No change in therapy.   BRADYCARDIA:  Her heart rate is controlled.  No change in therapy.   EDEMA:  This is is mild.  No change in therapy.

## 2018-09-27 ENCOUNTER — Encounter: Payer: Self-pay | Admitting: Cardiology

## 2018-09-27 ENCOUNTER — Other Ambulatory Visit: Payer: Self-pay

## 2018-09-27 ENCOUNTER — Ambulatory Visit (INDEPENDENT_AMBULATORY_CARE_PROVIDER_SITE_OTHER): Payer: MEDICARE | Admitting: Cardiology

## 2018-09-27 VITALS — BP 172/79 | HR 74 | Temp 97.2°F | Wt 161.0 lb

## 2018-09-27 DIAGNOSIS — I10 Essential (primary) hypertension: Secondary | ICD-10-CM

## 2018-09-27 DIAGNOSIS — R001 Bradycardia, unspecified: Secondary | ICD-10-CM

## 2018-09-27 NOTE — Patient Instructions (Signed)

## 2018-10-02 ENCOUNTER — Other Ambulatory Visit: Payer: Self-pay | Admitting: Cardiology

## 2018-11-18 ENCOUNTER — Encounter: Payer: Self-pay | Admitting: Gynecology

## 2018-12-18 ENCOUNTER — Other Ambulatory Visit: Payer: Self-pay | Admitting: Cardiology

## 2018-12-20 NOTE — Telephone Encounter (Signed)
Close this encounter

## 2019-02-18 DIAGNOSIS — D649 Anemia, unspecified: Secondary | ICD-10-CM | POA: Diagnosis not present

## 2019-02-18 DIAGNOSIS — C189 Malignant neoplasm of colon, unspecified: Secondary | ICD-10-CM | POA: Diagnosis not present

## 2019-02-18 DIAGNOSIS — I10 Essential (primary) hypertension: Secondary | ICD-10-CM | POA: Diagnosis not present

## 2019-04-13 ENCOUNTER — Other Ambulatory Visit: Payer: Self-pay | Admitting: *Deleted

## 2019-04-13 MED ORDER — CLONIDINE HCL 0.2 MG PO TABS: 0.2000 mg | ORAL_TABLET | Freq: Two times a day (BID) | ORAL | 1 refills | Status: DC

## 2019-04-13 NOTE — Telephone Encounter (Signed)
Rx has been sent to the pharmacy electronically. ° °

## 2019-04-19 ENCOUNTER — Other Ambulatory Visit: Payer: Self-pay

## 2019-04-19 MED ORDER — CLONIDINE HCL 0.2 MG PO TABS: 0.2000 mg | ORAL_TABLET | Freq: Two times a day (BID) | ORAL | 2 refills | Status: DC

## 2019-06-08 IMAGING — CT CT ABD-PELV W/ CM
2 of 5 series · 12 of 36 positions shown, 15 images · IV contrast (OMNIPAQUE)
Comparison: 07/01/2017

CLINICAL DATA: Follow-up colon cancer.

EXAM:
CT CHEST, ABDOMEN, AND PELVIS WITH CONTRAST
TECHNIQUE: Multidetector CT imaging of the chest, abdomen and pelvis was
performed following the standard protocol during bolus
administration of intravenous contrast.
CONTRAST:  100mL OMNIPAQUE IOHEXOL 300 MG/ML  SOLN

[Series 2: cap with · axial · 0.79mm/px · z∈[-751,-281]mm · 9 of 118 slices shown, 12 images]
[im 12/118  mediastinal]
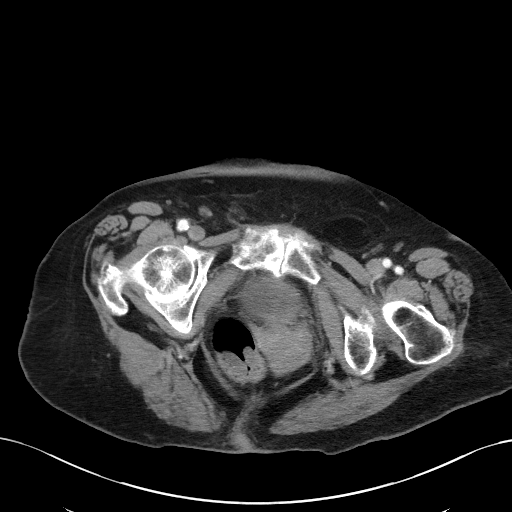
[im 12/118  lung]
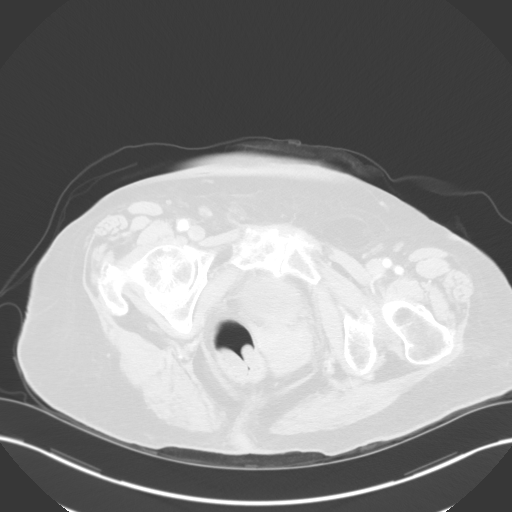
[im 24/118  lung]
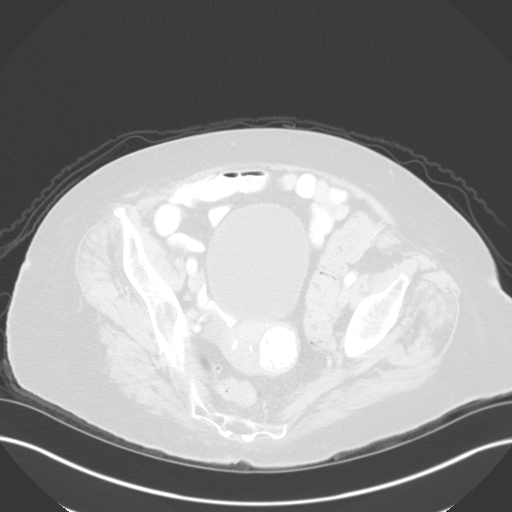
[im 36/118  lung]
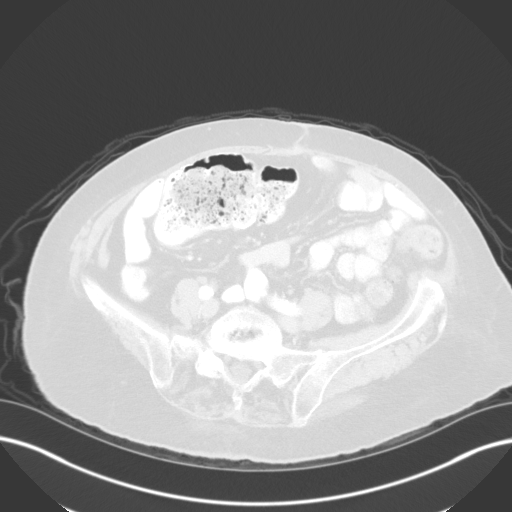
[im 47/118  lung]
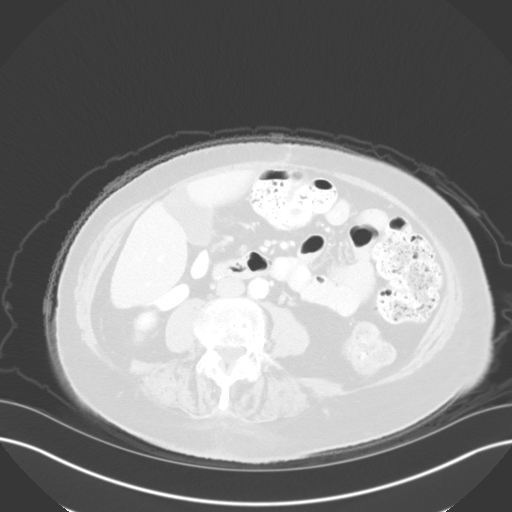
[im 59/118  mediastinal]
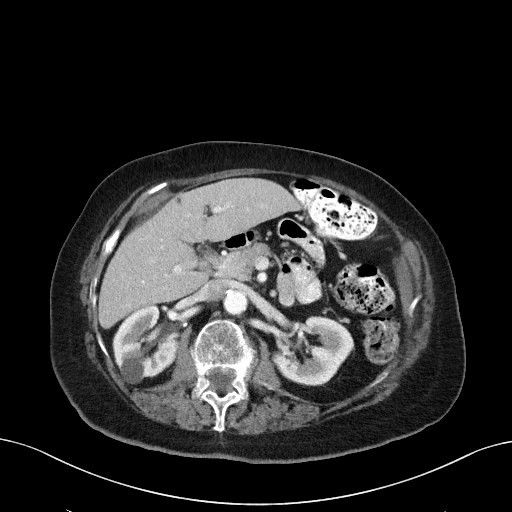
[im 59/118  lung]
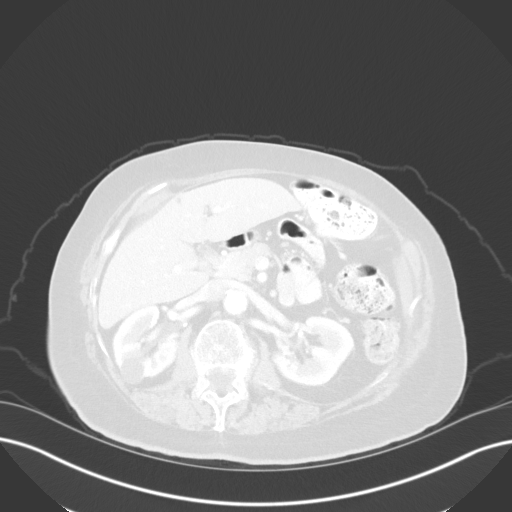
[im 71/118  lung]
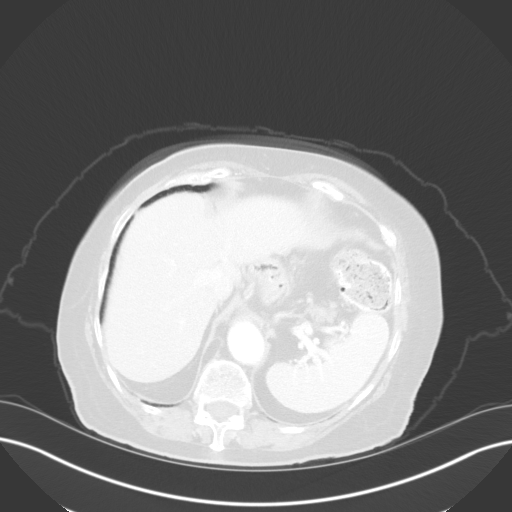
[im 82/118  lung]
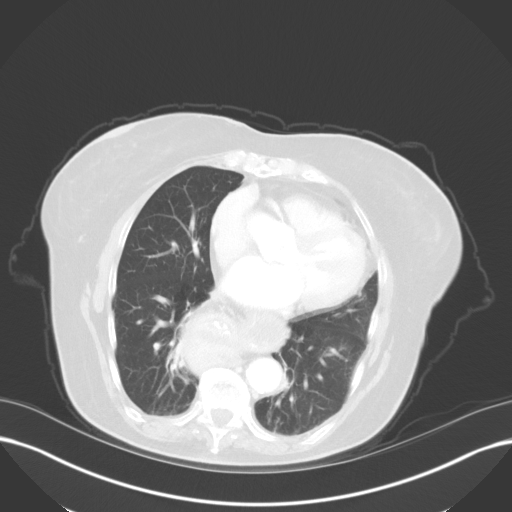
[im 94/118  lung]
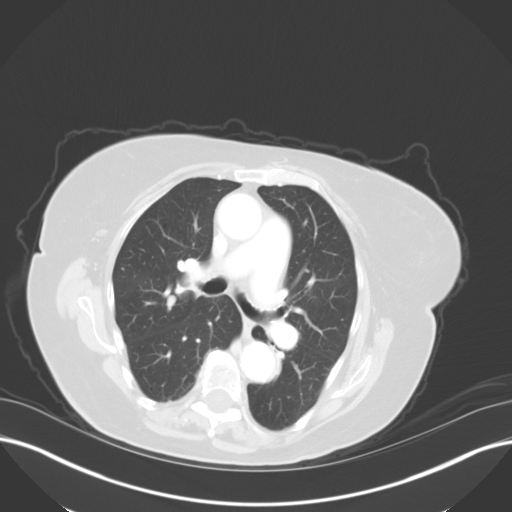
[im 106/118  mediastinal]
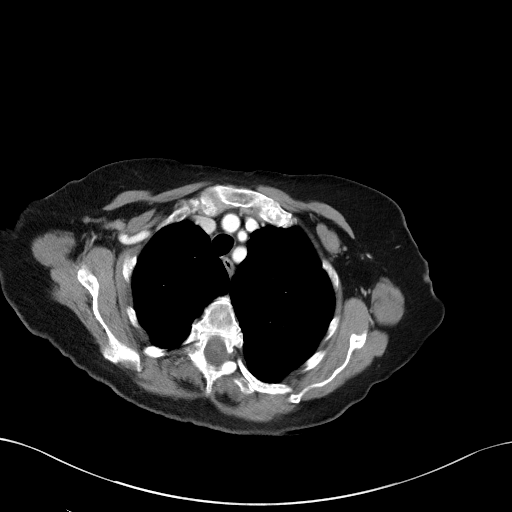
[im 106/118  lung]
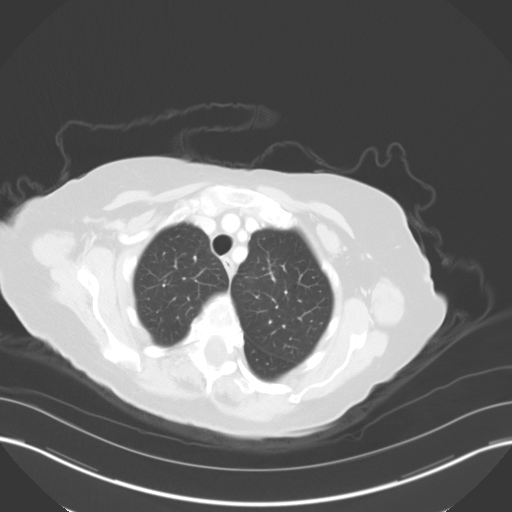

[Series 6: coronals · coronal · 0.79mm/px · 3 of 146 slices shown]
[im 30/146  lung]
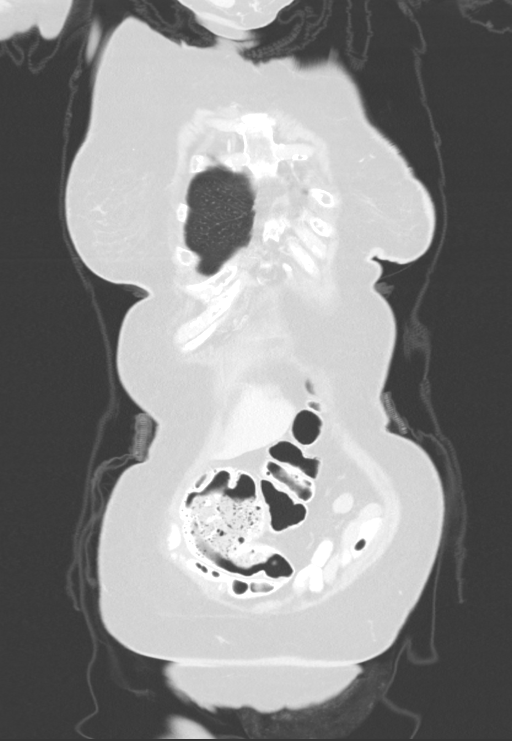
[im 59/146  lung]
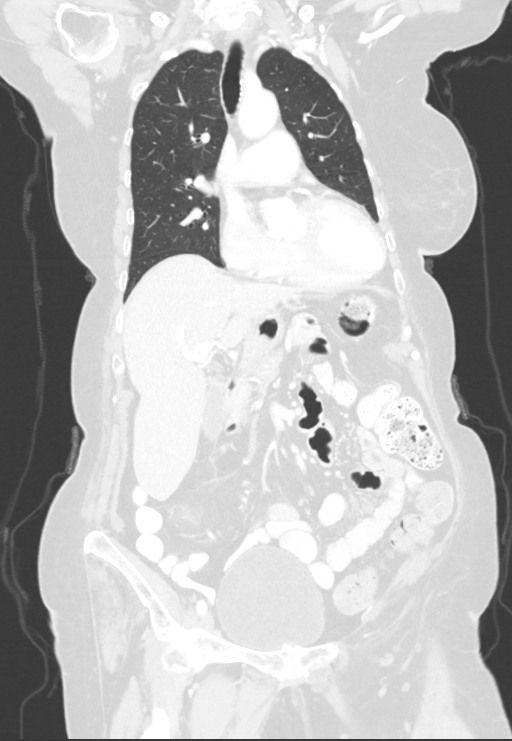
[im 88/146  lung]
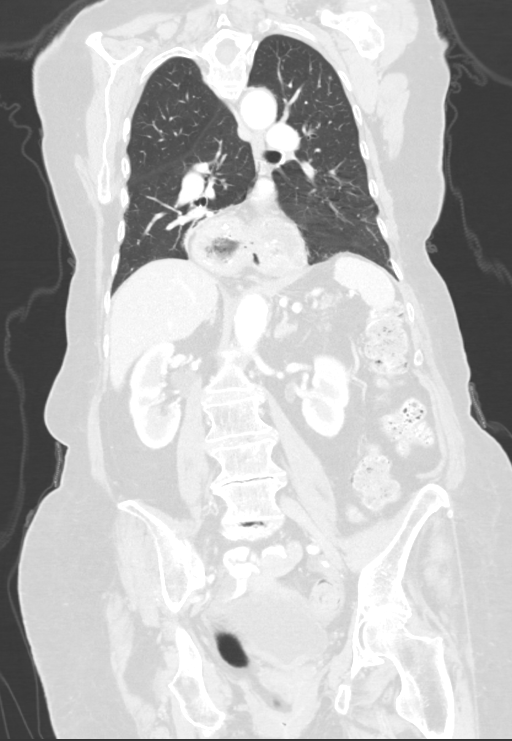

[12 of 36 positions shown; findings below may reference images not displayed]

FINDINGS: CT CHEST FINDINGS

Cardiovascular: Normal heart size. No pericardial effusion. Aortic
atherosclerosis.

Mediastinum/Nodes: Bilateral heterogeneous thyroid nodules are again
noted. The trachea appears patent and is midline. Large hiatal
hernia identified. No enlarged axillary or supraclavicular lymph
nodes. No mediastinal or hilar adenopathy identified.

Lungs/Pleura: No pleural effusion. Passive atelectasis identified
within the medial aspect of both lower lobes. Small scattered
pulmonary nodules are again noted. Index nodule within the anterior
left upper lobe is stable measuring 5 mm, image 42/5. The index
nodule in the superior segment of left lower lobe measures 4 mm,
image 37/5. Unchanged. Index nodule in the posterior left lower lobe
measures 5 mm and is stable, image 77/5. Stable 4 mm left lower lobe
lung nodule, image 94/5. Posterior right lower lobe lung nodule is
unchanged measuring 4 mm, image 87/5. 3 mm right upper lobe lung
nodule is stable, image 61/5.

Musculoskeletal: Osteopenia and thoracic spondylosis identified. No
aggressive lytic or sclerotic bone lesions identified. T11
compression deformity is stable.

CT ABDOMEN PELVIS FINDINGS

Hepatobiliary: Within the medial segment of left lobe of liver there
is a 6 mm hypodense lesion, image 61/2. Unchanged from 06/18/2017.
No new or suspicious liver lesions identified. Gallbladder normal.
No biliary ductal dilatation identified.

Pancreas: Unremarkable. No pancreatic ductal dilatation or
surrounding inflammatory changes.

Spleen: Normal in size without focal abnormality.

Adrenals/Urinary Tract: Normal appearance of the left adrenal gland.
Small low-density nodule in the right adrenal gland is unchanged.
Left adrenal gland is normal. Similar appearance of right kidney
cyst measuring 1.9 cm. No hydronephrosis identified. The urinary
bladder appears normal.

Stomach/Bowel: Large hiatal hernia containing at least 60%
intrathoracic stomach. No abnormal dilatation of the large or small
bowel loops. Status post right hemicolectomy with entero colonic
anastomosis. Distal colonic diverticulosis identified without acute
inflammation.

Vascular/Lymphatic: Aortic atherosclerosis. No aneurysm. No
adenopathy within the abdomen

Reproductive: Uterus contains multiple calcified fibroids. No
adnexal mass identified.

Other: No ascites. No peritoneal nodularity or mass. Left inguinal
hernia contains fat only.

Musculoskeletal: No acute or significant osseous findings.
IMPRESSION: 1. Stable appearance of scattered small pulmonary nodules within
both lungs. These remain indeterminate. Recommend continued interval
follow-up to ensure stability of these lesions.
2. No mass or adenopathy identified within the chest, abdomen or
pelvis.
3. Stable right adrenal nodule which may represent a benign adenoma.
4.  Aortic Atherosclerosis ([id number]).
5. Large hiatal hernia
6. Bilateral thyroid nodules. Nodule in the left lobe demonstrated
mild increased FDG uptake on previous PET-CT. Consider further
evaluation with thyroid sonogram.

## 2019-06-13 ENCOUNTER — Telehealth: Payer: Self-pay | Admitting: Cardiology

## 2019-06-13 NOTE — Telephone Encounter (Signed)
     I went in pt's chart to see who called her today(06-13-19)

## 2019-07-19 DIAGNOSIS — R32 Unspecified urinary incontinence: Secondary | ICD-10-CM | POA: Diagnosis not present

## 2019-07-19 DIAGNOSIS — Z0000 Encounter for general adult medical examination without abnormal findings: Secondary | ICD-10-CM | POA: Diagnosis not present

## 2019-07-19 DIAGNOSIS — I10 Essential (primary) hypertension: Secondary | ICD-10-CM | POA: Diagnosis not present

## 2019-07-19 DIAGNOSIS — E162 Hypoglycemia, unspecified: Secondary | ICD-10-CM | POA: Diagnosis not present

## 2019-07-19 DIAGNOSIS — D649 Anemia, unspecified: Secondary | ICD-10-CM | POA: Diagnosis not present

## 2019-07-19 DIAGNOSIS — Z131 Encounter for screening for diabetes mellitus: Secondary | ICD-10-CM | POA: Diagnosis not present

## 2019-07-19 DIAGNOSIS — R531 Weakness: Secondary | ICD-10-CM | POA: Diagnosis not present

## 2019-07-19 DIAGNOSIS — D473 Essential (hemorrhagic) thrombocythemia: Secondary | ICD-10-CM | POA: Diagnosis not present

## 2019-07-21 DIAGNOSIS — R32 Unspecified urinary incontinence: Secondary | ICD-10-CM | POA: Diagnosis not present

## 2019-08-17 DIAGNOSIS — C189 Malignant neoplasm of colon, unspecified: Secondary | ICD-10-CM | POA: Diagnosis not present

## 2019-08-17 DIAGNOSIS — D649 Anemia, unspecified: Secondary | ICD-10-CM | POA: Diagnosis not present

## 2019-08-17 DIAGNOSIS — I10 Essential (primary) hypertension: Secondary | ICD-10-CM | POA: Diagnosis not present

## 2019-08-24 NOTE — Progress Notes (Signed)
HEMATOLOGY/ONCOLOGY CLINIC NOTE  Date of Service:  08/26/19     Patient Care Team: Lawerance Cruel, MD as PCP - General Minus Breeding, MD as PCP - Cardiology (Cardiology) Excell Seltzer, MD (Inactive) as Consulting Physician (General Surgery) Richmond Campbell, MD as Consulting Physician (Gastroenterology) Minus Breeding, MD as Consulting Physician (Cardiology) Brunetta Genera, MD as Consulting Physician (Hematology)  CHIEF COMPLAINTS/PURPOSE OF CONSULTATION:  Iron deficiency Anemia and B12 deficiency F/u for colon cancer  HISTORY OF PRESENTING ILLNESS:   [name] [name] is a wonderful 86 y.o. female who has been referred to [place] by Dr .Harrington Challenger, Dwyane Luo, MD for evaluation and management of microcytic anemia.  Patient has a h/o HTN, atrial tachycardia, , benign tremor who was noted to have a new severe microcytic anemia and weakness with a drop of hgb down to 7.4 in sept 2017. She was admitted to the hospital and received 1 unit of PRBC. She was noted to have severe new iron deficiency and also noted to have B12 deficiency. Her stool occult blood was apparently neg. Was discharged on PO iron and was taking it for a few months. Rpt stool studies with PCP were again noted to be hemoccult neg. She received B12 IM x 1 in the hospital but has not been on any B12 since then. Patient had refused a GI workup in the hospital and was to be seen by Dr Fuller Plan for GI workup in the outpatient setting but has refused and continues to refuse and GI workup.  Denies any overt GI bleeding. No melena no hematemesis no hematochezia. No nausea/vomiting or abdominal pain. No significant weight loss recently.  Her hgb improved some on Po iron but then started dropping again and so she was referred to [place] for consideration of IV iron replacement.  On CXR in the hospital she was incidentally noted to have a moderate to large hiatal hernia.   INTERVAL HISTORY   [name] is here for fu  of her recently diagnosed colon cancer. The patient's last visit with [place] was on 03/31/2018. Pt is joined by her daughter. The pt reports that she is doing well overall.  The pt reports she is good. She tried to take Vitamin B12 but they made her itch so she stopped. Pt has not gotten her COVID19 vaccines. She has a healthy appetite. Pt has no new concerns. She sometimes has coughing spells but she has had this for a long time.    Lab results today (08/26/19) of CBC w/diff and CMP is as follows: all values are WNL except for RBC at 5.12, Hemoglobin at 15.2, HCT at 46.1, CO2 at 20, Alkaline Phosphatase at 129 08/26/19 of Ferritin at 69: WNL 08/26/19 of Vitamin B12 at 252: WNL 08/26/19 of CEA at 1.51: WNL  On review of systems, pt reports healthy appetite, pedal edema and denies irregular bowl movements, black/blood in stool, abdominal pains, constipation, diarrhea and any other symptoms.   MEDICAL HISTORY:  Past Medical History:  Diagnosis Date  . Anemia   . Atrial tachycardia ([place])   . Cancer ([place])    colon  . CHICKENPOX, HX OF 09/07/2008   Qualifier: Diagnosis of  By: Asa Lente MD, Jannifer Rodney Cough    yellow to clear sputum cough for a while per pt no fever  . HCAP (healthcare-associated pneumonia) 10/31/2015  . History of blood transfusion last transfusion 09-29-17  . HTN (hypertension)   . Jaundice due to hepatitis  at age 60 or 26  . Pneumonia 2014  . Transient global amnesia few yrs ago x 2   AMS  . Tremors of nervous system    benign familial  Large Hiatal hernia HCAP 10/2015 Iron deficiency Anemia B12 deficiency.  SURGICAL HISTORY: Past Surgical History:  Procedure Laterality Date  . ABDOMINAL HERNIA REPAIR  yrs ago  . BACK SURGERY     years ago lower  . LAPAROSCOPIC PARTIAL COLECTOMY N/A 11/04/2017   Procedure: LAPAROSCOPIC RIGHT HEMICOLECTOMY;  Surgeon: Excell Seltzer, MD;  Location: [facility];  Service: General;  Laterality: N/A;  . TONSILLECTOMY  age 63     SOCIAL HISTORY: Social History   Socioeconomic History  . Marital status: Married    Spouse name: Not on file  . Number of children: Not on file  . Years of education: Not on file  . Highest education level: Not on file  Occupational History  . Not on file  Tobacco Use  . Smoking status: Never Smoker  . Smokeless tobacco: Never Used  . Tobacco comment: + prior 2nd hand exposure from spouse smoking  Vaping Use  . Vaping Use: Never used  Substance and Sexual Activity  . Alcohol use: No    Alcohol/week: 0.0 standard drinks  . Drug use: No  . Sexual activity: Never  Other Topics Concern  . Not on file  Social History Narrative  . Not on file   Social Determinants of Health   Financial Resource Strain:   . Difficulty of Paying Living Expenses:   Food Insecurity:   . Worried About Charity fundraiser in the Last Year:   . Arboriculturist in the Last Year:   Transportation Needs:   . Film/video editor (Medical):   Marland Kitchen Lack of Transportation (Non-Medical):   Physical Activity:   . Days of Exercise per Week:   . Minutes of Exercise per Session:   Stress:   . Feeling of Stress :   Social Connections:   . Frequency of Communication with Friends and Family:   . Frequency of Social Gatherings with Friends and Family:   . Attends Religious Services:   . Active Member of Clubs or Organizations:   . Attends Archivist Meetings:   Marland Kitchen Marital Status:   Intimate Partner Violence:   . Fear of Current or Ex-Partner:   . Emotionally Abused:   Marland Kitchen Physically Abused:   . Sexually Abused:     FAMILY HISTORY: Family History  Problem Relation Age of Onset  . Lung cancer Mother   . Parkinson's disease Mother   . Hypertension Mother   . Other Father        blood clot  . Arthritis Sister   . Other Sister        spinal stenosis    ALLERGIES:  is allergic to aspirin, codeine, hydrochlorothiazide, penicillins, sulfa antibiotics, sulfonamide derivatives, biotin,  levofloxacin, macrodantin [nitrofurantoin], and latex.  MEDICATIONS:  Current Outpatient Medications  Medication Sig Dispense Refill  . amLODipine (NORVASC) 10 MG tablet TAKE 1 TABLET DAILY 90 tablet 3  . cloNIDine (CATAPRES) 0.2 MG tablet Take 1 tablet (0.2 mg total) by mouth 2 (two) times daily. 180 tablet 2  . MAGNESIUM CITRATE PO Take by mouth.    . magnesium citrate SOLN Take by mouth.    . nitrofurantoin, macrocrystal-monohydrate, (MACROBID) 100 MG capsule Take 100 mg by mouth 2 (two) times daily.     No current facility-administered medications for this  visit.   Facility-Administered Medications Ordered in Other Visits  Medication Dose Route Frequency Provider Last Rate Last Admin  . clindamycin (CLEOCIN) 900 mg in dextrose 5 % 50 mL IVPB  900 mg Intravenous 60 min Pre-Op Excell Seltzer, MD        REVIEW OF SYSTEMS:   A 10+ POINT REVIEW OF SYSTEMS WAS OBTAINED including neurology, dermatology, psychiatry, cardiac, respiratory, lymph, extremities, GI, GU, Musculoskeletal, constitutional, breasts, reproductive, HEENT.  All pertinent positives are noted in the HPI.  All others are negative.   PHYSICAL EXAMINATION:  ECOG PERFORMANCE STATUS: 2 - Symptomatic, <50% confined to bed  Vitals:   08/26/19 1145  BP: (!) 184/87  Pulse: 78  Resp: 18  Temp: 97.7 F (36.5 C)  SpO2: 99%   Filed Weights   08/26/19 1145  Weight: 170 lb 9.6 oz (77.4 kg)   .Body mass index is 30.22 kg/m.  Exam given in chair  GENERAL:alert, in no acute distress and comfortable SKIN: no acute rashes, no significant lesions EYES: conjunctiva are pink and non-injected, sclera anicteric OROPHARYNX: MMM, no exudates, no oropharyngeal erythema or ulceration NECK: supple, no JVD LYMPH:  no palpable lymphadenopathy in the cervical, axillary or inguinal regions LUNGS: clear to auscultation b/l with normal respiratory effort HEART: regular rate & rhythm ABDOMEN:  normoactive bowel sounds , non tender,  not distended. Extremity: 2+ pedal edema PSYCH: alert & oriented x 3 with fluent speech NEURO: no focal motor/sensory deficits  LABORATORY DATA:  I have reviewed the data as listed  . CBC Latest Ref Rng & Units 08/26/2019 03/31/2018 12/30/2017  WBC 4.0 - 10.5 K/uL 6.5 4.9 4.4  Hemoglobin 12.0 - 15.0 g/dL 15.2(H) 14.3 13.8  Hematocrit 36 - 46 % 46.1(H) 43.9 43.0  Platelets 150 - 400 K/uL 304 279 248    . CMP Latest Ref Rng & Units 08/26/2019 03/31/2018 12/30/2017  Glucose 70 - 99 mg/dL 88 168(H) 139(H)  BUN 8 - 23 mg/dL 12 12 13   Creatinine 0.44 - 1.00 mg/dL 0.70 0.73 0.78  Sodium 135 - 145 mmol/L 137 140 140  Potassium 3.5 - 5.1 mmol/L 4.1 4.1 3.7  Chloride 98 - 111 mmol/L 106 107 107  CO2 22 - 32 mmol/L 20(L) 24 25  Calcium 8.9 - 10.3 mg/dL 9.5 9.1 8.8(L)  Total Protein 6.5 - 8.1 g/dL 7.8 7.2 6.9  Total Bilirubin 0.3 - 1.2 mg/dL 0.4 0.3 0.2(L)  Alkaline Phos 38 - 126 U/L 129(H) 147(H) 133(H)  AST 15 - 41 U/L 18 17 15   ALT 0 - 44 U/L 15 13 11    . Lab Results  Component Value Date   IRON 101 03/31/2018   TIBC 311 03/31/2018   IRONPCTSAT 33 03/31/2018   (Iron and TIBC)  Lab Results  Component Value Date   FERRITIN 69 08/26/2019   B12 -- 256  Component     Latest Ref Rng & Units 05/08/2016  Parietal Cell Ab     0.0 - 20.0 Units 3.5  Intrinsic Factor Abs, Serum     0.0 - 1.1 AU/mL 0.9   11/06/17 Surgical Biopsy:     RADIOGRAPHIC STUDIES: I have personally reviewed the radiological images as listed and agreed with the findings in the report. No results found.  ASSESSMENT & PLAN:   86 y.o. caucasian female with   1) Severe Microcytic Anemia s/p PRBc transfusion in sept 2017.  This appears to be likely related to severe iron deficiency due to cecal adenocarcinoma  2) Severe Iron  deficiency- due to chronic GI losses from cecal adenocarcinoma . Lab Results  Component Value Date   IRON 101 03/31/2018   TIBC 311 03/31/2018   IRONPCTSAT 33 03/31/2018    (Iron and TIBC)  Lab Results  Component Value Date   FERRITIN 69 08/26/2019   3) B12 deficiency - antiparietal cell and anti IF ab neg. B12 levels adequate with current replacement. B12 --- 375 PLAN -I discussed goal is to maintain Ferritin > 100 and iron saturation at 20%.  -continue vit B complex 1 tab po daily to support accelerated hematopoiesis.  4. 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   11/04/17 Surgical pathology revealed 9.2cm invasive colorectal adenocarcinoma, clear margins, and three implicated lymph nodes in metastasis, with N1B status.    12/25/17 CT C/A/P revealed  Stable appearance of scattered small pulmonary nodules within both lungs. These remain indeterminate. Recommend continued interval follow-up to ensure stability of these lesions. 2. No mass or adenopathy identified within the chest, abdomen or Pelvis. 3. Stable right adrenal nodule which may represent a benign adenoma. 4.  Aortic Atherosclerosis (ICD10-I70.0). 5. Large hiatal hernia 6. Bilateral thyroid nodules. Nodule in the left lobe demonstrated mild increased FDG uptake on previous PET-CT. Consider further evaluation with thyroid sonogram.  PLAN:  -Discussed pt labwork today, 08/26/19;  of CBC w/diff and CMP is as follows: all values are WNL except for RBC at 5.12, Hemoglobin at 15.2, HCT at 46.1, CO2 at 20, Alkaline Phosphatase at 129 -Discussed 08/26/19 of Ferritin at 69: WNL -Discussed 08/26/19 of Vitamin B12 at 252- low -- patient has not been taking B12 replacement -Discussed 08/26/19 of CEA at 1.51: WNL -Advised on COVID19 vaccination recommendation to get this - patient resistant to taking the vaccine. -Advised on conservative approach -pt wants to watch once a year and does not want scans  -Advised on pedal  edema- f/u on PCP -Recommends getting COVID19 vaccines  -Will see back sooner if needed  -Will see back in 1 year   4)  . Patient Active Problem List   Diagnosis Date Noted  . Leg swelling 01/28/2018  . Bradycardia 01/28/2018  . Primary cancer of cecum pT3, pN1b s/p lap colectomy 11/04/2017 11/04/2017  . Chronic idiopathic constipation 04/27/2017  . Iron deficiency anemia 10/20/2015  . B12 deficiency anemia 10/20/2015  . Symptomatic anemia 10/19/2015  . Allergy 08/17/2015  . UTI (urinary tract infection) 07/26/2014  . TGA (transient global amnesia) 07/25/2014  . Cardiac dysrhythmia 02/06/2010  . PALPITATIONS 02/06/2010  . Essential hypertension, benign 05/22/2008  . PSVT 03/27/2008  . OBESITY, UNSPECIFIED 03/26/2008  . Abnormal involuntary movement 03/26/2008  . AMNESIA, TRANSIENT GLOBAL 01/10/2007  -advised continued f/u with PCP for management of her other medical co-morbids   FOLLOW UP: RTC with Dr Irene Limbo with labs in 12 months     The total time spent in the appt was 30 minutes and more than 50% was on counseling and direct patient cares.  All of the patient's questions were answered with apparent satisfaction. The patient knows to call the clinic with any problems, questions or concerns.   Sullivan Lone MD [place] AAHIVMS [facility] Hematology/Oncology Physician [facility]  (Office):       [phone] (Work cell):  [phone] (Fax):           [phone]  I, Margaretha Sheffield  Hinkel am acting as a scribe for Dr. Sullivan Lone.   .I have reviewed the above documentation for accuracy and completeness, and I agree with the above. Brunetta Genera MD

## 2019-08-25 ENCOUNTER — Other Ambulatory Visit: Payer: Self-pay | Admitting: Hematology

## 2019-08-25 DIAGNOSIS — D509 Iron deficiency anemia, unspecified: Secondary | ICD-10-CM

## 2019-08-25 DIAGNOSIS — C180 Malignant neoplasm of cecum: Secondary | ICD-10-CM

## 2019-08-25 DIAGNOSIS — E538 Deficiency of other specified B group vitamins: Secondary | ICD-10-CM

## 2019-08-26 ENCOUNTER — Inpatient Hospital Stay: Payer: MEDICARE

## 2019-08-26 ENCOUNTER — Other Ambulatory Visit: Payer: Self-pay

## 2019-08-26 ENCOUNTER — Inpatient Hospital Stay: Payer: MEDICARE | Attending: Hematology | Admitting: Hematology

## 2019-08-26 VITALS — BP 184/87 | HR 78 | Temp 97.7°F | Resp 18 | Ht 63.0 in | Wt 170.6 lb

## 2019-08-26 DIAGNOSIS — R251 Tremor, unspecified: Secondary | ICD-10-CM | POA: Diagnosis not present

## 2019-08-26 DIAGNOSIS — E538 Deficiency of other specified B group vitamins: Secondary | ICD-10-CM

## 2019-08-26 DIAGNOSIS — D509 Iron deficiency anemia, unspecified: Secondary | ICD-10-CM

## 2019-08-26 DIAGNOSIS — R000 Tachycardia, unspecified: Secondary | ICD-10-CM | POA: Diagnosis not present

## 2019-08-26 DIAGNOSIS — Z85038 Personal history of other malignant neoplasm of large intestine: Secondary | ICD-10-CM | POA: Insufficient documentation

## 2019-08-26 DIAGNOSIS — C180 Malignant neoplasm of cecum: Secondary | ICD-10-CM

## 2019-08-26 DIAGNOSIS — K449 Diaphragmatic hernia without obstruction or gangrene: Secondary | ICD-10-CM | POA: Insufficient documentation

## 2019-08-26 DIAGNOSIS — I10 Essential (primary) hypertension: Secondary | ICD-10-CM | POA: Diagnosis not present

## 2019-08-26 DIAGNOSIS — R918 Other nonspecific abnormal finding of lung field: Secondary | ICD-10-CM | POA: Diagnosis not present

## 2019-08-26 DIAGNOSIS — D500 Iron deficiency anemia secondary to blood loss (chronic): Secondary | ICD-10-CM | POA: Diagnosis not present

## 2019-08-26 LAB — CBC WITH DIFFERENTIAL/PLATELET
Abs Immature Granulocytes: 0.01 10*3/uL (ref 0.00–0.07)
Basophils Absolute: 0 10*3/uL (ref 0.0–0.1)
Basophils Relative: 1 %
Eosinophils Absolute: 0.1 10*3/uL (ref 0.0–0.5)
Eosinophils Relative: 2 %
HCT: 46.1 % — ABNORMAL HIGH (ref 36.0–46.0)
Hemoglobin: 15.2 g/dL — ABNORMAL HIGH (ref 12.0–15.0)
Immature Granulocytes: 0 %
Lymphocytes Relative: 26 %
Lymphs Abs: 1.7 10*3/uL (ref 0.7–4.0)
MCH: 29.7 pg (ref 26.0–34.0)
MCHC: 33 g/dL (ref 30.0–36.0)
MCV: 90 fL (ref 80.0–100.0)
Monocytes Absolute: 0.7 10*3/uL (ref 0.1–1.0)
Monocytes Relative: 11 %
Neutro Abs: 4 10*3/uL (ref 1.7–7.7)
Neutrophils Relative %: 60 %
Platelets: 304 10*3/uL (ref 150–400)
RBC: 5.12 MIL/uL — ABNORMAL HIGH (ref 3.87–5.11)
RDW: 13.2 % (ref 11.5–15.5)
WBC: 6.5 10*3/uL (ref 4.0–10.5)
nRBC: 0 % (ref 0.0–0.2)

## 2019-08-26 LAB — VITAMIN B12: Vitamin B-12: 252 pg/mL (ref 180–914)

## 2019-08-26 LAB — CMP (CANCER CENTER ONLY)
ALT: 15 U/L (ref 0–44)
AST: 18 U/L (ref 15–41)
Albumin: 4.1 g/dL (ref 3.5–5.0)
Alkaline Phosphatase: 129 U/L — ABNORMAL HIGH (ref 38–126)
Anion gap: 11 (ref 5–15)
BUN: 12 mg/dL (ref 8–23)
CO2: 20 mmol/L — ABNORMAL LOW (ref 22–32)
Calcium: 9.5 mg/dL (ref 8.9–10.3)
Chloride: 106 mmol/L (ref 98–111)
Creatinine: 0.7 mg/dL (ref 0.44–1.00)
GFR, Est AFR Am: >60 mL/min (ref >60)
GFR, Estimated: >60 mL/min (ref >60)
Glucose, Bld: 88 mg/dL (ref 70–99)
Potassium: 4.1 mmol/L (ref 3.5–5.1)
Sodium: 137 mmol/L (ref 135–145)
Total Bilirubin: 0.4 mg/dL (ref 0.3–1.2)
Total Protein: 7.8 g/dL (ref 6.5–8.1)

## 2019-08-26 LAB — FERRITIN: Ferritin: 69 ng/mL (ref 11–307)

## 2019-08-26 LAB — CEA (IN HOUSE-CHCC): CEA (CHCC-In House): 1.51 ng/mL (ref 0.00–5.00)

## 2019-08-30 ENCOUNTER — Telehealth: Payer: Self-pay | Admitting: Hematology

## 2019-08-30 NOTE — Telephone Encounter (Signed)
Scheduled per 07/16 los, patient has been called and notified.  

## 2019-09-08 ENCOUNTER — Telehealth: Payer: Self-pay | Admitting: *Deleted

## 2019-09-08 NOTE — Telephone Encounter (Signed)
-----   Message from Brunetta Genera, MD sent at 09/01/2019 11:20 PM EDT ----- Plz let patient know her B12 levels are low @ 256. Would recommend she restart taking 500 mcg of SL B12 OTC

## 2019-09-08 NOTE — Telephone Encounter (Signed)
Contacted patient regarding test results per Dr. Grier Mitts directions. Patient states she cannot take vitamins - B12 or Vit D or anything.  States she breaks out in a rash or hives every time she has taken them. Or she gets a UTI. She asked that Dr. Irene Limbo be informed. Message routed to MD.

## 2019-09-26 ENCOUNTER — Telehealth: Payer: Self-pay | Admitting: Cardiology

## 2019-09-26 NOTE — Telephone Encounter (Signed)
Patients daughter calling in regards to her mothers appt with Dr. Percival Spanish on 09/29/19. She states that her mother is not vaccinated and wants to know if she can do a virtual appt instead of coming in. Please advise.

## 2019-09-26 NOTE — Telephone Encounter (Signed)
Sure

## 2019-09-28 DIAGNOSIS — Z7189 Other specified counseling: Secondary | ICD-10-CM | POA: Insufficient documentation

## 2019-09-28 NOTE — Progress Notes (Signed)
Virtual Visit via Telephone Note   This visit type was conducted due to national recommendations for restrictions regarding the COVID-19 Pandemic (e.g. social distancing) in an effort to limit this patient's exposure and mitigate transmission in our community.  Due to her co-morbid illnesses, this patient is at least at moderate risk for complications without adequate follow up.  This format is felt to be most appropriate for this patient at this time.  The patient did not have access to video technology/had technical difficulties with video requiring transitioning to audio format only (telephone).  All issues noted in this document were discussed and addressed.  No physical exam could be performed with this format.  Please refer to the patient's chart for her  consent to telehealth for [facility].    Date:  09/29/2019   ID:  [name] [name], DOB 02-14-1927, MRN [id number] The patient was identified using 2 identifiers.  Patient Location: Home Provider Location: Home Office  PCP:  Lawerance Cruel, MD  Cardiologist:  Minus Breeding, MD  Electrophysiologist:  None   Evaluation Performed:  Follow-Up Visit  Chief Complaint:  Hypertension  History of Present Illness:    [name] [name] is a 86 y.o. female for follow up of HTN.   Since I last saw her she has done okay.  She gets around slowly with her walker.  She denies any cardiovascular symptoms.  Her blood pressure is little bit elevated today and it was when she was at the cancer center recently but she is not checking it routinely.  It was well controlled when I saw her previously.  She feels okay.  She denies any chest pressure, neck or arm discomfort.  She has had no shortness of breath, PND or orthopnea.  She reports no lower extremity swelling.   The patient does not have symptoms concerning for COVID-19 infection (fever, chills, cough, or new shortness of breath).    Past Medical History:  Diagnosis Date   Anemia     Atrial tachycardia ([place])    Cancer ([place])    colon   CHICKENPOX, HX OF 09/07/2008   Qualifier: Diagnosis of  By: Asa Lente MD, Mateo Flow A    Cough    yellow to clear sputum cough for a while per pt no fever   HCAP (healthcare-associated pneumonia) 10/31/2015   History of blood transfusion last transfusion 09-29-17   HTN (hypertension)    Jaundice due to hepatitis    at age 71 or 77   Pneumonia 2014   Transient global amnesia few yrs ago x 2   AMS   Tremors of nervous system    benign familial   Past Surgical History:  Procedure Laterality Date   ABDOMINAL HERNIA REPAIR  yrs ago   BACK SURGERY     years ago lower   LAPAROSCOPIC PARTIAL COLECTOMY N/A 11/04/2017   Procedure: LAPAROSCOPIC RIGHT HEMICOLECTOMY;  Surgeon: Excell Seltzer, MD;  Location: [facility];  Service: General;  Laterality: N/A;   TONSILLECTOMY  age 69     Current Meds  Medication Sig   amLODipine (NORVASC) 10 MG tablet TAKE 1 TABLET DAILY   cloNIDine (CATAPRES) 0.2 MG tablet Take 1 tablet (0.2 mg total) by mouth 2 (two) times daily.     Allergies:   Aspirin, Codeine, Hydrochlorothiazide, Penicillins, Sulfa antibiotics, Sulfonamide derivatives, Biotin, Levofloxacin, Macrodantin [nitrofurantoin], Vitamin b12, Vitamin d analogs, and Latex   Social History   Tobacco Use   Smoking status: Never Smoker   Smokeless tobacco:  Never Used   Tobacco comment: + prior 2nd hand exposure from spouse smoking  Vaping Use   Vaping Use: Never used  Substance Use Topics   Alcohol use: No    Alcohol/week: 0.0 standard drinks   Drug use: No     Family Hx: The patient's family history includes Arthritis in her sister; Hypertension in her mother; Lung cancer in her mother; Other in her father and sister; Parkinson's disease in her mother.  ROS:   Please see the history of present illness.    All other systems reviewed and are negative.   Prior CV studies:   The following studies were reviewed  today:  None  Labs/Other Tests and Data Reviewed:    EKG:  No ECG reviewed.  Recent Labs: 08/26/2019: ALT 15; BUN 12; Creatinine 0.70; Hemoglobin 15.2; Platelets 304; Potassium 4.1; Sodium 137   Recent Lipid Panel Lab Results  Component Value Date/Time   CHOL (H) 01/10/2007 12:03 AM    236        ATP III CLASSIFICATION:  <200     mg/dL   Desirable  200-239  mg/dL   Borderline High  >=240    mg/dL   High   TRIG 132 01/10/2007 12:03 AM   HDL 42 01/10/2007 12:03 AM   CHOLHDL 5.6 01/10/2007 12:03 AM   LDLCALC (H) 01/10/2007 12:03 AM    168        Total Cholesterol/HDL:CHD Risk Coronary Heart Disease Risk Table                     Men   Women  1/2 Average Risk   3.4   3.3    Wt Readings from Last 3 Encounters:  09/29/19 167 lb (75.8 kg)  08/26/19 170 lb 9.6 oz (77.4 kg)  09/27/18 161 lb (73 kg)     Objective:    Vital Signs:  BP (!) 148/87    Pulse 82    Ht 5\' 2"  (1.575 m)    Wt 167 lb (75.8 kg)    BMI 30.54 kg/m    VITAL SIGNS:  reviewed  ASSESSMENT & PLAN:    ESSENTIAL HYPERTENSION, BENIGN -  Her blood pressure is  slightly elevated and she is going to check it 3-4 times per week and let me know.  She is pleased overall with her control as it was difficult to control previously.    BRADYCARDIA:  Her heart rate is not slow.  She has had no presyncope or syncope.  I would avoid any AV nodal blocking agents going forward  EDEMA:  This is is mild by her report.  No change in therapy.   COVID-19 EDUCATION: She has no symptoms consistent with Covid.  She does not want the vaccine.  I discussed social distancing and the importance in this situation particularly with the virulence of the delta strain.   Time:   Today, I have spent 16   Medication Adjustments/Labs and Tests Ordered: Current medicines are reviewed at length with the patient today.  Concerns regarding medicines are outlined above.   Tests Ordered: No orders of the defined types were placed in this  encounter.   Medication Changes: No orders of the defined types were placed in this encounter.   Follow Up:  In Person In 1 year  Signed, Minus Breeding, MD  09/29/2019 12:10 PM    [place]

## 2019-09-29 ENCOUNTER — Encounter: Payer: Self-pay | Admitting: Cardiology

## 2019-09-29 ENCOUNTER — Telehealth (INDEPENDENT_AMBULATORY_CARE_PROVIDER_SITE_OTHER): Payer: MEDICARE | Admitting: Cardiology

## 2019-09-29 VITALS — BP 148/87 | HR 82 | Ht 62.0 in | Wt 167.0 lb

## 2019-09-29 DIAGNOSIS — Z7189 Other specified counseling: Secondary | ICD-10-CM | POA: Diagnosis not present

## 2019-09-29 DIAGNOSIS — I10 Essential (primary) hypertension: Secondary | ICD-10-CM

## 2019-09-29 DIAGNOSIS — M7989 Other specified soft tissue disorders: Secondary | ICD-10-CM

## 2019-09-29 DIAGNOSIS — R001 Bradycardia, unspecified: Secondary | ICD-10-CM

## 2019-09-29 NOTE — Patient Instructions (Signed)

## 2019-12-08 DIAGNOSIS — D649 Anemia, unspecified: Secondary | ICD-10-CM | POA: Diagnosis not present

## 2019-12-08 DIAGNOSIS — C189 Malignant neoplasm of colon, unspecified: Secondary | ICD-10-CM | POA: Diagnosis not present

## 2019-12-08 DIAGNOSIS — I10 Essential (primary) hypertension: Secondary | ICD-10-CM | POA: Diagnosis not present

## 2020-01-15 ENCOUNTER — Other Ambulatory Visit: Payer: Self-pay | Admitting: Cardiology

## 2020-01-18 DIAGNOSIS — H35413 Lattice degeneration of retina, bilateral: Secondary | ICD-10-CM | POA: Diagnosis not present

## 2020-01-18 DIAGNOSIS — H2513 Age-related nuclear cataract, bilateral: Secondary | ICD-10-CM | POA: Diagnosis not present

## 2020-01-19 ENCOUNTER — Telehealth: Payer: Self-pay | Admitting: Cardiology

## 2020-01-19 NOTE — Telephone Encounter (Signed)
    Pt c/o medication issue:  1. Name of Medication:   amLODipine (NORVASC) 10 MG tablet    2. How are you currently taking this medication (dosage and times per day)? TAKE 1 TABLET DAILY  3. Are you having a reaction (difficulty breathing--STAT)?   4. What is your medication issue? Pt's daughter calling, she said [facility] needs prio auth for this medication

## 2020-01-19 NOTE — Telephone Encounter (Signed)
Spoke with Ruby in the prior auth department at [facility]. No prior authorization needed. Patient should receive amlodipine soon.   Updated patient via telephone.

## 2020-01-25 ENCOUNTER — Telehealth: Payer: Self-pay | Admitting: Cardiology

## 2020-01-25 NOTE — Telephone Encounter (Signed)
*  STAT* If patient is at the pharmacy, call can be transferred to refill team.   1. Which medications need to be refilled? (please list name of each medication and dose if known) amLODipine (NORVASC) 10 MG tablet  2. Which pharmacy/location (including street and city if local pharmacy) is medication to be sent to? [facility], [place] AT Portal to Registered [facility] Sites  3. Do they need a 30 day or 90 day supply? 90 day

## 2020-01-31 ENCOUNTER — Telehealth: Payer: Self-pay | Admitting: Cardiology

## 2020-01-31 NOTE — Telephone Encounter (Signed)
Spoke with pharmacy and refilled meds

## 2020-01-31 NOTE — Telephone Encounter (Signed)
*  STAT* If patient is at the pharmacy, call can be transferred to refill team.   1. Which medications need to be refilled? (please list name of each medication and dose if known)  amLODipine (NORVASC) 10 MG tablet  2. Which pharmacy/location (including street and city if local pharmacy) is medication to be sent to? [facility], [place] AT Portal to Registered [employer] Sites  3. Do they need a 30 day or 90 day supply? 90  Patients daughter is calling in to get refill, she made a prior refill request last week and it failed. Please advise.

## 2020-03-07 DIAGNOSIS — I10 Essential (primary) hypertension: Secondary | ICD-10-CM | POA: Diagnosis not present

## 2020-03-07 DIAGNOSIS — D649 Anemia, unspecified: Secondary | ICD-10-CM | POA: Diagnosis not present

## 2020-03-07 DIAGNOSIS — C189 Malignant neoplasm of colon, unspecified: Secondary | ICD-10-CM | POA: Diagnosis not present

## 2020-04-18 DIAGNOSIS — C189 Malignant neoplasm of colon, unspecified: Secondary | ICD-10-CM | POA: Diagnosis not present

## 2020-04-18 DIAGNOSIS — D649 Anemia, unspecified: Secondary | ICD-10-CM | POA: Diagnosis not present

## 2020-04-18 DIAGNOSIS — I10 Essential (primary) hypertension: Secondary | ICD-10-CM | POA: Diagnosis not present

## 2020-05-22 ENCOUNTER — Other Ambulatory Visit: Payer: Self-pay

## 2020-05-22 ENCOUNTER — Encounter: Payer: Self-pay | Admitting: Podiatry

## 2020-05-22 ENCOUNTER — Ambulatory Visit (INDEPENDENT_AMBULATORY_CARE_PROVIDER_SITE_OTHER): Payer: MEDICARE | Admitting: Podiatry

## 2020-05-22 DIAGNOSIS — M79674 Pain in right toe(s): Secondary | ICD-10-CM | POA: Diagnosis not present

## 2020-05-22 DIAGNOSIS — M79675 Pain in left toe(s): Secondary | ICD-10-CM

## 2020-05-22 DIAGNOSIS — B351 Tinea unguium: Secondary | ICD-10-CM

## 2020-05-22 NOTE — Progress Notes (Signed)
This patient returns to the office for evaluation and treatment of long thick painful nails both great toes. .  This patient is unable to trim his own nails since the patient cannot reach his feet.  Patient says the nails are painful walking and wearing his shoes.  She presents to the office with her daughter.  He returns for preventive foot care services.  General Appearance  Alert, conversant and in no acute stress.  Vascular  Dorsalis pedis and posterior tibial  pulses are weakly  palpable  bilaterally.  Capillary return is within normal limits  bilaterally. Cold feet  Bilaterally. Absent digital hair.  Neurologic  Senn-Weinstein monofilament wire test within normal limits  bilaterally. Muscle power within normal limits bilaterally.  Nails Thick disfigured discolored nails with subungual debris  Hallux toenails  bilaterally. No evidence of bacterial infection or drainage bilaterally.  Orthopedic  No limitations of motion  feet .  No crepitus or effusions noted.  No bony pathology or digital deformities noted.  Skin  normotropic skin with no porokeratosis noted bilaterally.  No signs of infections or ulcers noted.     Onychomycosis  Pain in toes right foot  Pain in toes left foot  Debridement  of nails  1-5  B/L with a nail nipper.  Nails were then filed using a dremel tool with no incidents.    RTC  Prn.   Gardiner Barefoot DPM

## 2020-07-18 DIAGNOSIS — C180 Malignant neoplasm of cecum: Secondary | ICD-10-CM | POA: Diagnosis not present

## 2020-07-18 DIAGNOSIS — N814 Uterovaginal prolapse, unspecified: Secondary | ICD-10-CM | POA: Diagnosis not present

## 2020-07-18 DIAGNOSIS — K648 Other hemorrhoids: Secondary | ICD-10-CM | POA: Diagnosis not present

## 2020-07-18 DIAGNOSIS — K623 Rectal prolapse: Secondary | ICD-10-CM | POA: Diagnosis not present

## 2020-07-18 DIAGNOSIS — Z9049 Acquired absence of other specified parts of digestive tract: Secondary | ICD-10-CM | POA: Diagnosis not present

## 2020-08-17 ENCOUNTER — Encounter: Payer: Self-pay | Admitting: Hematology

## 2020-08-20 ENCOUNTER — Encounter: Payer: Self-pay | Admitting: Obstetrics & Gynecology

## 2020-08-20 ENCOUNTER — Telehealth: Payer: Self-pay

## 2020-08-20 ENCOUNTER — Telehealth (INDEPENDENT_AMBULATORY_CARE_PROVIDER_SITE_OTHER): Payer: MEDICARE | Admitting: Obstetrics & Gynecology

## 2020-08-20 DIAGNOSIS — N765 Ulceration of vagina: Secondary | ICD-10-CM

## 2020-08-20 DIAGNOSIS — N814 Uterovaginal prolapse, unspecified: Secondary | ICD-10-CM

## 2020-08-20 NOTE — Telephone Encounter (Signed)
Patient has appt scheduled today at 3:30pm but is refusing to come to it.  SHe is also refusing hysterectomy as recommended by Dr. Earlean Shawl. Her prolapsed uterus is ulcerated and foul smelling and daughter needs advice as caregiver and wants to know if Dr. Marguerita Merles will meet with them in video visit.  I discussed the situation with Dr. Dellis Filbert and she agreed. SHe asked if daughter could take a photo of the prolapse for the video visit since she will not be examining her. Daughter agreed.  [name] [name] informed to change visit to video visit.

## 2020-08-20 NOTE — Progress Notes (Signed)
    [name] [name] 09-22-1927 [id number]        87 y.o.  G4P4L4 Verbal consent obtained for video visit.  Patient well identified.  Video visit was between the patient with her 2 daughters at their residence and myself at my [place] office.  RP: Vaginal ulcer associated with complete uterine prolapse with cystocele  HPI: Patient had pessary complications with vaginal ulcers at pessary pressure points.  She also had recurrent acute cystitis when using the pessary.  Therefore the decision was made to remove the pessary to let the ulcers heal and see if the acute cystitis would recur less often.  Patient has not had a bladder infection since then.  The apex of the prolapsed uterus and anterior wall of the vagina has an ulcer with bad odor.  Patient is not complaining of pain.  Would like to discuss management to help treat the vaginal ulceration.   OB History  Gravida Para Term Preterm AB Living  4 4       4   SAB IAB Ectopic Multiple Live Births               # Outcome Date GA Lbr Len/2nd Weight Sex Delivery Anes PTL Lv  4 Para           3 Para           2 Para           1 Para             Past medical history,surgical history, problem list, medications, allergies, family history and social history were all reviewed and documented in the EPIC chart.   Directed ROS with pertinent positives and negatives documented in the history of present illness/assessment and plan.  Exam:  There were no vitals filed for this visit. General appearance:  Normal  Pictures of the prolapsed uterus with anterior wall shown on the screen.  A small ulcer was present at the apex of the prolapse.  No pus seen.  The ulcer was about 1 cm in diameter.   Assessment/Plan:  87 y.o. G4P4   1. Cystocele with uterine prolapse  Complete uterine prolapse with cystocele.  Pessary usage complicated by vaginal ulcers and recurrent acute cystitis.  No recurrence of acute cystitis since removal of the pessary.  Vaginal  ulcer has developed at the apex of the prolapse with odors.  2. Vaginal ulcer Counseling on management of vaginal ulcer.  Recommend pushing the prolapsed uterus back into the vagina every night, and as often as possible during the day.  Also recommend warm soaking as frequently as possible to keep clean and avoid friction and dryness.  Will clean with peroxide for the next 3 days and then intermittently as needed.  Given the multiple antibiotic allergies that the patient has and no clear evidence of infection per pictures shown, decision not to treat with antibiotics at this time.  Recommend applying Vaseline generously to prevent friction when active.  Longer-term management also discussed with patient and daughters.  Recommend colpocleisis when the vaginal ulcer is healed.  Patient referred to Dr. Wannetta Sender urogynecologist.  Patient and daughters voiced understanding and agreement with plan.  Princess Bruins MD, 4:01 PM 08/20/2020

## 2020-08-24 ENCOUNTER — Ambulatory Visit: Payer: MEDICARE | Admitting: Hematology

## 2020-08-24 ENCOUNTER — Other Ambulatory Visit: Payer: MEDICARE

## 2020-08-31 ENCOUNTER — Other Ambulatory Visit: Payer: Self-pay

## 2020-08-31 DIAGNOSIS — C180 Malignant neoplasm of cecum: Secondary | ICD-10-CM

## 2020-09-01 NOTE — Progress Notes (Signed)
HEMATOLOGY/ONCOLOGY CLINIC NOTE  Date of Service:  .09/03/2020    Patient Care Team: Lawerance Cruel, MD as PCP - General Minus Breeding, MD as PCP - Cardiology (Cardiology) Excell Seltzer, MD (Inactive) as Consulting Physician (General Surgery) Richmond Campbell, MD as Consulting Physician (Gastroenterology) Minus Breeding, MD as Consulting Physician (Cardiology) Brunetta Genera, MD as Consulting Physician (Hematology)  CHIEF COMPLAINTS/PURPOSE OF CONSULTATION:  Iron deficiency Anemia and B12 deficiency F/u for colon cancer  HISTORY OF PRESENTING ILLNESS:   [name] [name] is a wonderful 87 y.o. female who has been referred to [place] by Dr .Harrington Challenger, Dwyane Luo, MD for evaluation and management of microcytic anemia.  Patient has a h/o HTN, atrial tachycardia, , benign tremor who was noted to have a new severe microcytic anemia and weakness with a drop of hgb down to 7.4 in sept 2017. She was admitted to the hospital and received 1 unit of PRBC. She was noted to have severe new iron deficiency and also noted to have B12 deficiency. Her stool occult blood was apparently neg. Was discharged on PO iron and was taking it for a few months. Rpt stool studies with PCP were again noted to be hemoccult neg. She received B12 IM x 1 in the hospital but has not been on any B12 since then. Patient had refused a GI workup in the hospital and was to be seen by Dr Fuller Plan for GI workup in the outpatient setting but has refused and continues to refuse and GI workup.  Denies any overt GI bleeding. No melena no hematemesis no hematochezia. No nausea/vomiting or abdominal pain. No significant weight loss recently.  Her hgb improved some on Po iron but then started dropping again and so she was referred to [place] for consideration of IV iron replacement.  On CXR in the hospital she was incidentally noted to have a moderate to large hiatal hernia.   INTERVAL HISTORY   [name] [name] is here for  follow-up of her iron deficiency anemia and colon cancer. The patient's last visit with [place] was on 08/26/2019. Pt is joined by her daughter. The pt reports that she is doing well overall.  The pt reports that she has been doing well.  Good appetite.  No significant weight loss.  No overt evidence of GI bleeding.  No abdominal pain.  Regular bowel habits. She notes that she generally does not like to go to see doctors but does enjoy coming to meet [place] which is very kind of her to say. She does note that she would not want to be seen very frequently and is happy with yearly follow-ups.  Lab results today 09/03/2020 were reviewed with her.  Hemoglobin remains normal at 14.8 with normocytic MCV.  CMP within normal limits other than hyperglycemia with blood sugar at 228.  CEA level is within normal limits at 1.53.  Ferritin is down to 14.   On review of systems, pt reports age-related arthritic issues but no other symptoms suggestive of colon cancer recurrence at this time.   MEDICAL HISTORY:  Past Medical History:  Diagnosis Date   Anemia    Atrial tachycardia ([place])    Cancer ([place])    colon   [place], HX OF 09/07/2008   Qualifier: Diagnosis of  By: Asa Lente MD, Mateo Flow A    Cough    yellow to clear sputum cough for a while per pt no fever   HCAP (healthcare-associated pneumonia) 10/31/2015   History of blood transfusion  last transfusion 09-29-17   HTN (hypertension)    Jaundice due to hepatitis    at age 75 or 74   Pneumonia 2014   Transient global amnesia few yrs ago x 2   AMS   Tremors of nervous system    benign familial  Large Hiatal hernia HCAP 10/2015 Iron deficiency Anemia B12 deficiency.  SURGICAL HISTORY: Past Surgical History:  Procedure Laterality Date   ABDOMINAL HERNIA REPAIR  yrs ago   BACK SURGERY     years ago lower   LAPAROSCOPIC PARTIAL COLECTOMY N/A 11/04/2017   Procedure: LAPAROSCOPIC RIGHT HEMICOLECTOMY;  Surgeon: Excell Seltzer, MD;  Location: [facility];   Service: General;  Laterality: N/A;   TONSILLECTOMY  age 21    SOCIAL HISTORY: Social History   Socioeconomic History   Marital status: Married    Spouse name: Not on file   Number of children: Not on file   Years of education: Not on file   Highest education level: Not on file  Occupational History   Not on file  Tobacco Use   Smoking status: Never   Smokeless tobacco: Never   Tobacco comments:    + prior 2nd hand exposure from spouse smoking  Vaping Use   Vaping Use: Never used  Substance and Sexual Activity   Alcohol use: No    Alcohol/week: 0.0 standard drinks   Drug use: No   Sexual activity: Never  Other Topics Concern   Not on file  Social History Narrative   Not on file   Social Determinants of Health   Financial Resource Strain: Not on file  Food Insecurity: Not on file  Transportation Needs: Not on file  Physical Activity: Not on file  Stress: Not on file  Social Connections: Not on file  Intimate Partner Violence: Not on file    FAMILY HISTORY: Family History  Problem Relation Age of Onset   Lung cancer Mother    Parkinson's disease Mother    Hypertension Mother    Other Father        blood clot   Arthritis Sister    Other Sister        spinal stenosis    ALLERGIES:  is allergic to aspirin, codeine, hydrochlorothiazide, penicillins, sulfa antibiotics, sulfonamide derivatives, biotin, levofloxacin, macrodantin [nitrofurantoin], vitamin b12, vitamin d analogs, and latex.  MEDICATIONS:  Current Outpatient Medications  Medication Sig Dispense Refill   amLODipine (NORVASC) 10 MG tablet TAKE 1 TABLET DAILY 90 tablet 3   cloNIDine (CATAPRES) 0.2 MG tablet Take 1 tablet (0.2 mg total) by mouth 2 (two) times daily. 180 tablet 2   No current facility-administered medications for this visit.   Facility-Administered Medications Ordered in Other Visits  Medication Dose Route Frequency Provider Last Rate Last Admin   clindamycin (CLEOCIN) 900 mg in  dextrose 5 % 50 mL IVPB  900 mg Intravenous 60 min Pre-Op Excell Seltzer, MD        REVIEW OF SYSTEMS:   10 Point review of Systems was done is negative except as noted above.  PHYSICAL EXAMINATION:  ECOG PERFORMANCE STATUS: 2 - Symptomatic, <50% confined to bed  Vitals:   09/03/20 1319  BP: (!) 184/93  Pulse: (!) 110  Resp: 18  Temp: 98 F (36.7 C)  SpO2: 99%    Filed Weights   09/03/20 1319  Weight: 186 lb 14.4 oz (84.8 kg)    .Body mass index is 34.18 kg/m.  NAD GENERAL:alert, in no acute distress and comfortable SKIN:  no acute rashes, no significant lesions EYES: conjunctiva are pink and non-injected, sclera anicteric OROPHARYNX: MMM, no exudates, no oropharyngeal erythema or ulceration NECK: supple, no JVD LYMPH:  no palpable lymphadenopathy in the cervical, axillary or inguinal regions LUNGS: clear to auscultation b/l with normal respiratory effort HEART: regular rate & rhythm ABDOMEN:  normoactive bowel sounds , non tender, not distended. Extremity: no pedal edema PSYCH: alert & oriented x 3 with fluent speech NEURO: no focal motor/sensory deficits  LABORATORY DATA:  I have reviewed the data as listed  . CBC Latest Ref Rng & Units 09/03/2020 08/26/2019 03/31/2018  WBC 4.0 - 10.5 K/uL 8.7 6.5 4.9  Hemoglobin 12.0 - 15.0 g/dL 14.8 15.2(H) 14.3  Hematocrit 36.0 - 46.0 % 45.1 46.1(H) 43.9  Platelets 150 - 400 K/uL 375 304 279    . CMP Latest Ref Rng & Units 09/03/2020 08/26/2019 03/31/2018  Glucose 70 - 99 mg/dL 228(H) 88 168(H)  BUN 8 - 23 mg/dL '13 12 12  '$ Creatinine 0.44 - 1.00 mg/dL 0.81 0.70 0.73  Sodium 135 - 145 mmol/L 136 137 140  Potassium 3.5 - 5.1 mmol/L 4.0 4.1 4.1  Chloride 98 - 111 mmol/L 102 106 107  CO2 22 - 32 mmol/L 21(L) 20(L) 24  Calcium 8.9 - 10.3 mg/dL 9.1 9.5 9.1  Total Protein 6.5 - 8.1 g/dL 7.6 7.8 7.2  Total Bilirubin 0.3 - 1.2 mg/dL 0.4 0.4 0.3  Alkaline Phos 38 - 126 U/L 120 129(H) 147(H)  AST 15 - 41 U/L '17 18 17  '$ ALT 0 -  44 U/L '15 15 13   '$ Lab Results  Component Value Date   FERRITIN 14 09/03/2020    Component     Latest Ref Rng & Units 05/08/2016  Parietal Cell Ab     0.0 - 20.0 Units 3.5  Intrinsic Factor Abs, Serum     0.0 - 1.1 AU/mL 0.9   11/06/17 Surgical Biopsy:     RADIOGRAPHIC STUDIES: I have personally reviewed the radiological images as listed and agreed with the findings in the report. No results found.  ASSESSMENT & PLAN:   87 y.o. caucasian female with   1) Severe Microcytic Anemia s/p PRBc transfusion in sept 2017.  This appears to be likely related to severe iron deficiency due to cecal adenocarcinoma  2) Severe Iron deficiency- due to chronic GI losses from cecal adenocarcinoma  Lab Results  Component Value Date   FERRITIN 14 09/03/2020   3) B12 deficiency - antiparietal cell and anti IF ab neg. B12 levels adequate with current replacement.  PLAN -I discussed goal is to maintain Ferritin > 100 and iron saturation at 20%.  -continue vit B complex 1 tab po daily to support accelerated hematopoiesis.  4.  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   11/04/17 Surgical pathology revealed 9.2cm invasive colorectal adenocarcinoma, clear margins, and three implicated lymph nodes in metastasis, with N1B status.    12/25/17 CT C/A/P revealed  Stable appearance of scattered small pulmonary nodules within both lungs. These remain indeterminate. Recommend continued interval follow-up to ensure stability of these lesions. 2. No mass or adenopathy identified within the chest, abdomen or Pelvis. 3. Stable right adrenal nodule which may represent a benign adenoma. 4.  Aortic Atherosclerosis (ICD10-I70.0). 5. Large hiatal hernia 6. Bilateral thyroid nodules. Nodule in the left lobe demonstrated  mild increased FDG  uptake on previous PET-CT. Consider further evaluation with thyroid sonogram.  Patient is status post laparoscopic right hemicolectomy by Dr. Excell Seltzer on 11/04/2017.  She had chosen to not pursue any adjuvant chemotherapy. She would like to take a palliative approach and does not want frequent screening or follow-ups.  PLAN:  -Discussed pt labwork today, 09/03/2020; CBC is within normal limits, CMP unremarkable other than hyperglycemia. -Patient CEA levels are within normal limits. -She does not have any lab or clinical evidence of colon cancer progression at this time. -Given her age of 89 years she is not keen to pursue very active clinical follow-up or repeat imaging studies to monitor or manage her colon cancer at this time. -Her ferritin level is 14 and we shall give her the option of replacing her iron stores IV.  If not we would recommend she continue to take oral iron replacement. -Per patient's wishes we shall see her back with labs in 12 months.  4)  . Patient Active Problem List   Diagnosis Date Noted   Educated about COVID-19 virus infection 09/28/2019   Leg swelling 01/28/2018   Bradycardia 01/28/2018   Primary cancer of cecum pT3, pN1b s/p lap colectomy 11/04/2017 11/04/2017   Chronic idiopathic constipation 04/27/2017   Iron deficiency anemia 10/20/2015   B12 deficiency anemia 10/20/2015   Symptomatic anemia 10/19/2015   Allergy 08/17/2015   UTI (urinary tract infection) 07/26/2014   TGA (transient global amnesia) 07/25/2014   Cardiac dysrhythmia 02/06/2010   PALPITATIONS 02/06/2010   Essential hypertension, benign 05/22/2008   PSVT 03/27/2008   OBESITY, UNSPECIFIED 03/26/2008   Abnormal involuntary movement 03/26/2008   AMNESIA, TRANSIENT GLOBAL 01/10/2007  -advised continued f/u with PCP for management of her other medical co-morbids   FOLLOW UP: RTC with Dr Irene Limbo with labs in 12 months      The total time spent in the appt was 20 minutes and more than 50%  was on counseling and direct patient cares.  All of the patient's questions were answered with apparent satisfaction. The patient knows to call the clinic with any problems, questions or concerns.   Sullivan Lone MD [place] AAHIVMS [facility] Hematology/Oncology Physician [facility]  (Office):       [phone] (Work cell):  [phone] (Fax):           [phone]  I, Reinaldo Raddle, am acting as scribe for Dr. Sullivan Lone, MD.   .I have reviewed the above documentation for accuracy and completeness, and I agree with the above. Brunetta Genera MD

## 2020-09-03 ENCOUNTER — Other Ambulatory Visit: Payer: Self-pay

## 2020-09-03 ENCOUNTER — Inpatient Hospital Stay (HOSPITAL_BASED_OUTPATIENT_CLINIC_OR_DEPARTMENT_OTHER): Payer: MEDICARE | Admitting: Hematology

## 2020-09-03 ENCOUNTER — Inpatient Hospital Stay: Payer: MEDICARE | Attending: Hematology

## 2020-09-03 VITALS — BP 184/93 | HR 110 | Temp 98.0°F | Resp 18 | Wt 186.9 lb

## 2020-09-03 DIAGNOSIS — D509 Iron deficiency anemia, unspecified: Secondary | ICD-10-CM | POA: Diagnosis not present

## 2020-09-03 DIAGNOSIS — I471 Supraventricular tachycardia: Secondary | ICD-10-CM | POA: Insufficient documentation

## 2020-09-03 DIAGNOSIS — E538 Deficiency of other specified B group vitamins: Secondary | ICD-10-CM | POA: Insufficient documentation

## 2020-09-03 DIAGNOSIS — D500 Iron deficiency anemia secondary to blood loss (chronic): Secondary | ICD-10-CM | POA: Diagnosis not present

## 2020-09-03 DIAGNOSIS — Z801 Family history of malignant neoplasm of trachea, bronchus and lung: Secondary | ICD-10-CM | POA: Insufficient documentation

## 2020-09-03 DIAGNOSIS — I10 Essential (primary) hypertension: Secondary | ICD-10-CM | POA: Diagnosis not present

## 2020-09-03 DIAGNOSIS — I700 Atherosclerosis of aorta: Secondary | ICD-10-CM | POA: Insufficient documentation

## 2020-09-03 DIAGNOSIS — C180 Malignant neoplasm of cecum: Secondary | ICD-10-CM

## 2020-09-03 LAB — CEA (IN HOUSE-CHCC): CEA (CHCC-In House): 1.53 ng/mL (ref 0.00–5.00)

## 2020-09-03 LAB — CMP (CANCER CENTER ONLY)
ALT: 15 U/L (ref 0–44)
AST: 17 U/L (ref 15–41)
Albumin: 3.9 g/dL (ref 3.5–5.0)
Alkaline Phosphatase: 120 U/L (ref 38–126)
Anion gap: 13 (ref 5–15)
BUN: 13 mg/dL (ref 8–23)
CO2: 21 mmol/L — ABNORMAL LOW (ref 22–32)
Calcium: 9.1 mg/dL (ref 8.9–10.3)
Chloride: 102 mmol/L (ref 98–111)
Creatinine: 0.81 mg/dL (ref 0.44–1.00)
GFR, Estimated: >60 mL/min (ref >60)
Glucose, Bld: 228 mg/dL — ABNORMAL HIGH (ref 70–99)
Potassium: 4 mmol/L (ref 3.5–5.1)
Sodium: 136 mmol/L (ref 135–145)
Total Bilirubin: 0.4 mg/dL (ref 0.3–1.2)
Total Protein: 7.6 g/dL (ref 6.5–8.1)

## 2020-09-03 LAB — FERRITIN: Ferritin: 14 ng/mL (ref 11–307)

## 2020-09-03 LAB — CBC WITH DIFFERENTIAL (CANCER CENTER ONLY)
Abs Immature Granulocytes: 0.02 10*3/uL (ref 0.00–0.07)
Basophils Absolute: 0 10*3/uL (ref 0.0–0.1)
Basophils Relative: 0 %
Eosinophils Absolute: 0 10*3/uL (ref 0.0–0.5)
Eosinophils Relative: 0 %
HCT: 45.1 % (ref 36.0–46.0)
Hemoglobin: 14.8 g/dL (ref 12.0–15.0)
Immature Granulocytes: 0 %
Lymphocytes Relative: 17 %
Lymphs Abs: 1.4 10*3/uL (ref 0.7–4.0)
MCH: 28.4 pg (ref 26.0–34.0)
MCHC: 32.8 g/dL (ref 30.0–36.0)
MCV: 86.4 fL (ref 80.0–100.0)
Monocytes Absolute: 0.5 10*3/uL (ref 0.1–1.0)
Monocytes Relative: 6 %
Neutro Abs: 6.7 10*3/uL (ref 1.7–7.7)
Neutrophils Relative %: 77 %
Platelet Count: 375 10*3/uL (ref 150–400)
RBC: 5.22 MIL/uL — ABNORMAL HIGH (ref 3.87–5.11)
RDW: 13.3 % (ref 11.5–15.5)
WBC Count: 8.7 10*3/uL (ref 4.0–10.5)
nRBC: 0 % (ref 0.0–0.2)

## 2020-09-04 ENCOUNTER — Telehealth: Payer: Self-pay | Admitting: Hematology

## 2020-09-04 NOTE — Telephone Encounter (Signed)
Scheduled follow-up appointment per 7/25 los. Patient is aware. 

## 2020-09-06 LAB — VITAMIN B1: Vitamin B1 (Thiamine): 175.7 nmol/L (ref 66.5–200.0)

## 2020-09-09 ENCOUNTER — Encounter: Payer: Self-pay | Admitting: Hematology

## 2020-09-14 ENCOUNTER — Telehealth: Payer: Self-pay

## 2020-09-14 NOTE — Telephone Encounter (Signed)
Per Dr Irene Limbo:  let pt know her CEA tumor marker is within normal limits which is quite reassuring.  However her iron levels are gradually coming down probably from poor absorption or from her hiatal hernia.  I know she is not very keen for additional work-up or imaging.  But we should offer her the option of IV iron replacement to replace her iron stores to avoid her getting anemic.  At this time she does not have anemia so she will need only 1 IV iron infusion.  If she prefers not to have IV iron would recommend taking over-the-counter iron polysaccharide 150 mg p.o. daily.  Follow-up with primary care physician in 6 months we shall see her back in 71month as scheduled unless she has new concerns.   Pt was not sure she wanted to take oral iron due to her past hx of constipation when taking it. Pt will discuss getting IV iron with daughter and let u[place]now. Pt acknowledged information and verbalized understanding of information .

## 2020-09-18 ENCOUNTER — Telehealth: Payer: Self-pay | Admitting: Hematology

## 2020-09-18 NOTE — Telephone Encounter (Signed)
Scheduled appt per 8/9 sch msg. Called pt's daughter per msg. No answer. Left msg with appt date and time.

## 2020-09-24 ENCOUNTER — Inpatient Hospital Stay: Payer: MEDICARE | Attending: Hematology

## 2020-09-24 ENCOUNTER — Ambulatory Visit: Payer: MEDICARE

## 2020-09-24 ENCOUNTER — Other Ambulatory Visit: Payer: Self-pay

## 2020-09-24 VITALS — BP 152/77 | HR 81 | Temp 98.2°F | Resp 17

## 2020-09-24 DIAGNOSIS — D509 Iron deficiency anemia, unspecified: Secondary | ICD-10-CM | POA: Diagnosis not present

## 2020-09-24 DIAGNOSIS — Z79899 Other long term (current) drug therapy: Secondary | ICD-10-CM | POA: Diagnosis not present

## 2020-09-24 DIAGNOSIS — D500 Iron deficiency anemia secondary to blood loss (chronic): Secondary | ICD-10-CM

## 2020-09-24 MED ORDER — DIPHENHYDRAMINE HCL 25 MG PO TABS
25.0000 mg | ORAL_TABLET | Freq: Once | ORAL | Status: AC
  Administered 2020-09-24: 25 mg via ORAL
  Filled 2020-09-24: qty 1

## 2020-09-24 MED ORDER — ACETAMINOPHEN 325 MG PO TABS
650.0000 mg | ORAL_TABLET | Freq: Once | ORAL | Status: AC
  Administered 2020-09-24: 650 mg via ORAL
  Filled 2020-09-24: qty 2

## 2020-09-24 MED ORDER — DIPHENHYDRAMINE HCL 25 MG PO CAPS
ORAL_CAPSULE | ORAL | Status: AC
  Filled 2020-09-24: qty 1

## 2020-09-24 MED ORDER — SODIUM CHLORIDE 0.9 % IV SOLN: INTRAVENOUS | Status: DC

## 2020-09-24 MED ORDER — SODIUM CHLORIDE 0.9 % IV SOLN
750.0000 mg | Freq: Once | INTRAVENOUS | Status: AC
  Administered 2020-09-24: 750 mg via INTRAVENOUS
  Filled 2020-09-24: qty 15

## 2020-09-24 NOTE — Patient Instructions (Signed)
Ferric carboxymaltose injection What is this medication? FERRIC CARBOXYMALTOSE (FER ik car BOX ee MAWL tose) treats anemia in people with chronic kidney disease or people who cannot take iron by mouth. It works by increasing iron in your body which helps red blood cells deliver oxygen fromthe lungs to cells all over the body. This medicine may be used for other purposes; ask your health care provider orpharmacist if you have questions. COMMON BRAND NAME(S): Injectafer What should I tell my care team before I take this medication? They need to know if you have any of these conditions: High blood pressure High levels of iron in the blood An unusual or allergic reaction to iron, other medications, foods, dyes, or preservatives Pregnant or trying to get pregnant Breast-feeding How should I use this medication? This medication is injected into a vein. It is given by your care team in ahospital or clinic setting. Talk to your care team about the use of this medication in children. While it may be given to children as young as 1 year for selected conditions,precautions do apply. Overdosage: If you think you have taken too much of this medicine contact apoison control center or emergency room at once. NOTE: This medicine is only for you. Do not share this medicine with others. What if I miss a dose? Keep appointments for follow-up doses. It is important not to miss your dose.Call your care team if you are unable to keep an appointment. What may interact with this medication? Do not take this medication with any of the following medications: Deferoxamine Dimercaprol Other iron products This list may not describe all possible interactions. Give your health care provider a list of all the medicines, herbs, non-prescription drugs, or dietary supplements you use. Also tell them if you smoke, drink alcohol, or use illegaldrugs. Some items may interact with your medicine. What should I watch for while  using this medication? Visit your care team for regular checks on your progress. Tell your care teamif your symptoms do not start to get better or if they get worse. You may need blood work while you are taking this medication. You may need to eat more foods that contain iron. Talk to your care team. Foods that contain iron include whole grains/cereals, dried fruits, beans, or peas,leafy green vegetables, and organ meats (liver, kidney). What side effects may I notice from receiving this medication? Side effects that you should report to your care team as soon as possible: Allergic reactions-skin rash, itching, hives, swelling of the face, lips, tongue, or throat Dizziness Facial flushing, redness Increase in blood pressure Low phosphorous levels-loss of appetite, muscle weakness Side effects that usually do not require medical attention (report to your careteam if they continue or are bothersome): Change in taste Constipation Headache Nausea Pain, redness, or irritation at injection site Vomiting This list may not describe all possible side effects. Call your doctor for medical advice about side effects. You may report side effects to FDA at1-800-FDA-1088. Where should I keep my medication? This medication is given in a hospital or clinic. It will not be stored at home. NOTE: This sheet is a summary. It may not cover all possible information. If you have questions about this medicine, talk to your doctor, pharmacist, orhealth care provider.  2022 Elsevier/Gold Standard (2020-02-24 12:26:19)  

## 2020-10-04 NOTE — Progress Notes (Signed)
Cardiology Office Note   Date:  10/05/2020   ID:  [name] [name], DOB 07/21/1927, MRN [id number]  PCP:  Lawerance Cruel, MD  Cardiologist:   Minus Breeding, MD   Chief Complaint  Patient presents with   Edema       History of Present Illness: [name] [name] is a 87 y.o. female who presents for follow up of HTN.   Since I last saw her she has done OK.  Her husband died of a stroke.  Her daughter has been a long-term survivor of melanoma but he is having significant problems and might give up therapy soon.   Despite that she has done well.  She gets around with a walker in her home.  She denies any new cardiovascular symptoms.  She does have lower extremity swelling but this has been stable.  She has had no PND or orthopnea.  She had no palpitations, presyncope or syncope.  She denies any chest pain.  Past Medical History:  Diagnosis Date   Anemia    Atrial tachycardia ([place])    Cancer ([place])    colon   CHICKENPOX, HX OF 09/07/2008   Qualifier: Diagnosis of  By: Asa Lente MD, Mateo Flow A    Cough    yellow to clear sputum cough for a while per pt no fever   HCAP (healthcare-associated pneumonia) 10/31/2015   History of blood transfusion last transfusion 09-29-17   HTN (hypertension)    Jaundice due to hepatitis    at age 89 or 22   Pneumonia 2014   Transient global amnesia few yrs ago x 2   AMS   Tremors of nervous system    benign familial    Past Surgical History:  Procedure Laterality Date   ABDOMINAL HERNIA REPAIR  yrs ago   BACK SURGERY     years ago lower   LAPAROSCOPIC PARTIAL COLECTOMY N/A 11/04/2017   Procedure: LAPAROSCOPIC RIGHT HEMICOLECTOMY;  Surgeon: Excell Seltzer, MD;  Location: [facility];  Service: General;  Laterality: N/A;   TONSILLECTOMY  age 29     Current Outpatient Medications  Medication Sig Dispense Refill   amLODipine (NORVASC) 10 MG tablet Take 1 tablet (10 mg total) by mouth daily. 90 tablet 3   cloNIDine (CATAPRES) 0.2 MG tablet Take 1  tablet (0.2 mg total) by mouth 2 (two) times daily. 180 tablet 3   No current facility-administered medications for this visit.   Facility-Administered Medications Ordered in Other Visits  Medication Dose Route Frequency Provider Last Rate Last Admin   clindamycin (CLEOCIN) 900 mg in dextrose 5 % 50 mL IVPB  900 mg Intravenous 60 min Pre-Op Excell Seltzer, MD        Allergies:   Aspirin, Codeine, Hydrochlorothiazide, Penicillins, Sulfa antibiotics, Sulfonamide derivatives, Biotin, Levofloxacin, Macrodantin [nitrofurantoin], Vitamin b12, Vitamin d analogs, and Latex    ROS:  Please see the history of present illness.   Otherwise, review of systems are positive for none.   All other systems are reviewed and negative.    PHYSICAL EXAM: VS:  BP (!) 163/85   Pulse 99   Ht '5\' 3"'$  (1.6 m)   Wt 191 lb 9.6 oz (86.9 kg)   SpO2 98%   BMI 33.94 kg/m  , BMI Body mass index is 33.94 kg/m. GEN:  No distress NECK:  No jugular venous distention at 90 degrees, waveform within normal limits, carotid upstroke brisk and symmetric, no bruits, no thyromegaly LYMPHATICS:  No cervical adenopathy LUNGS:  Clear to auscultation bilaterally BACK:  No CVA tenderness CHEST:  Unremarkable HEART:  S1 and S2 within normal limits, no S3, no S4, no clicks, no rubs, no murmurs ABD:  Positive bowel sounds normal in frequency in pitch, no bruits, no rebound, no guarding, unable to assess midline mass or bruit with the patient seated. EXT:  2 plus pulses throughout, moderate edema, no cyanosis no clubbing SKIN:  No rashes no nodules NEURO:  Cranial nerves II through XII grossly intact, motor grossly intact throughout PSYCH:  Cognitively intact, oriented to person place and time   EKG:  EKG is ordered today. The ekg ordered today demonstrates sinus rhythm, rate 99, axis within normal limits, intervals within normal limits, low voltage in the limb leads and chest leads.   Recent Labs: 09/03/2020: ALT 15; BUN 13;  Creatinine 0.81; Hemoglobin 14.8; Platelet Count 375; Potassium 4.0; Sodium 136    Lipid Panel    Component Value Date/Time   CHOL (H) 01/10/2007 0003    236        ATP III CLASSIFICATION:  <200     mg/dL   Desirable  200-239  mg/dL   Borderline High  >=240    mg/dL   High   TRIG 132 01/10/2007 0003   HDL 42 01/10/2007 0003   CHOLHDL 5.6 01/10/2007 0003   VLDL 26 01/10/2007 0003   LDLCALC (H) 01/10/2007 0003    168        Total Cholesterol/HDL:CHD Risk Coronary Heart Disease Risk Table                     Men   Women  1/2 Average Risk   3.4   3.3      Wt Readings from Last 3 Encounters:  10/05/20 191 lb 9.6 oz (86.9 kg)  09/03/20 186 lb 14.4 oz (84.8 kg)  09/29/19 167 lb (75.8 kg)      Other studies Reviewed: Additional studies/ records that were reviewed today include: Labs. Review of the above records demonstrates:  Please see elsewhere in the note.     ASSESSMENT AND PLAN:  ESSENTIAL HYPERTENSION, BENIGN -  Her blood pressure is mildly elevated but she is absolutely not can take any other medicines.  She will consent to take what we have her on.  She wants to get her medications from this office so I will see her yearly.  EDEMA:  This is stable.  She keeps her feet elevated.  She has not had to take a diuretic that would be an emergent situation.    Current medicines are reviewed at length with the patient today.  The patient does not have concerns regarding medicines.  The following changes have been made:  no change  Labs/ tests ordered today include: None  Orders Placed This Encounter  Procedures   EKG 12-Lead      Disposition:   FU with me in one year.     Signed, Minus Breeding, MD  10/05/2020 2:06 PM    [facility]

## 2020-10-05 ENCOUNTER — Other Ambulatory Visit: Payer: Self-pay

## 2020-10-05 ENCOUNTER — Encounter: Payer: Self-pay | Admitting: Cardiology

## 2020-10-05 ENCOUNTER — Ambulatory Visit (INDEPENDENT_AMBULATORY_CARE_PROVIDER_SITE_OTHER): Payer: MEDICARE | Admitting: Cardiology

## 2020-10-05 VITALS — BP 163/85 | HR 99 | Ht 63.0 in | Wt 191.6 lb

## 2020-10-05 DIAGNOSIS — M7989 Other specified soft tissue disorders: Secondary | ICD-10-CM

## 2020-10-05 DIAGNOSIS — I10 Essential (primary) hypertension: Secondary | ICD-10-CM

## 2020-10-05 DIAGNOSIS — R001 Bradycardia, unspecified: Secondary | ICD-10-CM

## 2020-10-05 MED ORDER — AMLODIPINE BESYLATE 10 MG PO TABS: 10.0000 mg | ORAL_TABLET | Freq: Every day | ORAL | 3 refills | Status: DC

## 2020-10-05 MED ORDER — CLONIDINE HCL 0.2 MG PO TABS: 0.2000 mg | ORAL_TABLET | Freq: Two times a day (BID) | ORAL | 3 refills | Status: DC

## 2020-10-05 NOTE — Patient Instructions (Signed)
Medication Instructions:  Your physician recommends that you continue on your current medications as directed. Please refer to the Current Medication list given to you today.  *If you need a refill on your cardiac medications before your next appointment, please call your pharmacy*  Follow-Up: At [facility], you and your health needs are our priority.  As part of our continuing mission to provide you with exceptional heart care, we have created designated Provider Care Teams.  These Care Teams include your primary Cardiologist (physician) and Advanced Practice Providers (APPs -  Physician Assistants and Nurse Practitioners) who all work together to provide you with the care you need, when you need it.  We recommend signing up for the patient portal called "MyChart".  Sign up information is provided on this After Visit Summary.  MyChart is used to connect with patients for Virtual Visits (Telemedicine).  Patients are able to view lab/test results, encounter notes, upcoming appointments, etc.  Non-urgent messages can be sent to your provider as well.   To learn more about what you can do with MyChart, go to [email or web address].    Your next appointment:   12 month(s)  The format for your next appointment:   In Person  Provider:   You may see Minus Breeding, MD or one of the following Advanced Practice Providers on your designated Care Team:   Rosaria Ferries, PA-C Caron Presume, PA-C Jory Sims, DNP, ANP   Other Instructions

## 2020-11-16 ENCOUNTER — Telehealth: Payer: Self-pay | Admitting: Cardiology

## 2020-11-16 NOTE — Telephone Encounter (Signed)
Spoke to patient's daughter Jacqlyn Larsen she stated mother accidentally took todays and Saturdays medications.Advised not to take medications tomorrow and start back on Sunday.Advised to monitor B/P and stay hydrated.I will make Dr.Hochrein aware.

## 2020-11-16 NOTE — Telephone Encounter (Signed)
Pt c/o medication issue:  1. Name of Medication: cloNIDine (CATAPRES) 0.2 MG tablet amLODipine (NORVASC) 10 MG tablet  2. How are you currently taking this medication (dosage and times per day)? PT TOOK DOUBLE OF BOTH PILLS  3. Are you having a reaction (difficulty breathing--STAT)? NO REACTION AS OF NOW, PT IS CURRENTLY EATING LUNCH. I CAN HEAR THE PT IN THE BACKGROUND SAYING  SHE FEELS FINE.  4. What is your medication issue? PT ACCIDENTALLY TOOK DOUBLE OF BOTH SCRIPTS THIS MORNING. PT TOOK Friday AND Saturday PILLS TODAY!!

## 2021-04-22 ENCOUNTER — Telehealth: Payer: Self-pay | Admitting: Hematology

## 2021-04-22 NOTE — Telephone Encounter (Signed)
Scheduled follow-up appointment per 3/10 staff message. Patient's daughter is aware. ?

## 2021-05-08 ENCOUNTER — Ambulatory Visit: Payer: MEDICARE | Admitting: Hematology

## 2021-05-08 ENCOUNTER — Other Ambulatory Visit: Payer: MEDICARE

## 2021-05-13 DIAGNOSIS — E559 Vitamin D deficiency, unspecified: Secondary | ICD-10-CM | POA: Diagnosis not present

## 2021-05-13 DIAGNOSIS — R059 Cough, unspecified: Secondary | ICD-10-CM | POA: Diagnosis not present

## 2021-05-13 DIAGNOSIS — Z6835 Body mass index (BMI) 35.0-35.9, adult: Secondary | ICD-10-CM | POA: Diagnosis not present

## 2021-05-13 DIAGNOSIS — J309 Allergic rhinitis, unspecified: Secondary | ICD-10-CM | POA: Diagnosis not present

## 2021-05-13 DIAGNOSIS — J3489 Other specified disorders of nose and nasal sinuses: Secondary | ICD-10-CM | POA: Diagnosis not present

## 2021-05-13 DIAGNOSIS — R946 Abnormal results of thyroid function studies: Secondary | ICD-10-CM | POA: Diagnosis not present

## 2021-05-13 DIAGNOSIS — D473 Essential (hemorrhagic) thrombocythemia: Secondary | ICD-10-CM | POA: Diagnosis not present

## 2021-05-24 ENCOUNTER — Other Ambulatory Visit: Payer: Self-pay

## 2021-05-24 DIAGNOSIS — D500 Iron deficiency anemia secondary to blood loss (chronic): Secondary | ICD-10-CM

## 2021-05-27 ENCOUNTER — Inpatient Hospital Stay: Payer: MEDICARE | Attending: Hematology

## 2021-05-27 ENCOUNTER — Inpatient Hospital Stay (HOSPITAL_BASED_OUTPATIENT_CLINIC_OR_DEPARTMENT_OTHER): Payer: MEDICARE | Admitting: Hematology

## 2021-05-27 ENCOUNTER — Other Ambulatory Visit: Payer: Self-pay

## 2021-05-27 VITALS — BP 145/93 | HR 123 | Temp 97.5°F | Resp 18 | Wt 195.1 lb

## 2021-05-27 DIAGNOSIS — D751 Secondary polycythemia: Secondary | ICD-10-CM | POA: Insufficient documentation

## 2021-05-27 DIAGNOSIS — I10 Essential (primary) hypertension: Secondary | ICD-10-CM | POA: Insufficient documentation

## 2021-05-27 DIAGNOSIS — D509 Iron deficiency anemia, unspecified: Secondary | ICD-10-CM | POA: Insufficient documentation

## 2021-05-27 DIAGNOSIS — K449 Diaphragmatic hernia without obstruction or gangrene: Secondary | ICD-10-CM | POA: Insufficient documentation

## 2021-05-27 DIAGNOSIS — I4891 Unspecified atrial fibrillation: Secondary | ICD-10-CM | POA: Insufficient documentation

## 2021-05-27 DIAGNOSIS — D500 Iron deficiency anemia secondary to blood loss (chronic): Secondary | ICD-10-CM

## 2021-05-27 DIAGNOSIS — C180 Malignant neoplasm of cecum: Secondary | ICD-10-CM

## 2021-05-27 DIAGNOSIS — E538 Deficiency of other specified B group vitamins: Secondary | ICD-10-CM | POA: Insufficient documentation

## 2021-05-27 LAB — CBC WITH DIFFERENTIAL (CANCER CENTER ONLY)
Abs Immature Granulocytes: 0.02 10*3/uL (ref 0.00–0.07)
Basophils Absolute: 0 10*3/uL (ref 0.0–0.1)
Basophils Relative: 1 %
Eosinophils Absolute: 0.1 10*3/uL (ref 0.0–0.5)
Eosinophils Relative: 1 %
HCT: 51.1 % — ABNORMAL HIGH (ref 36.0–46.0)
Hemoglobin: 17 g/dL — ABNORMAL HIGH (ref 12.0–15.0)
Immature Granulocytes: 0 %
Lymphocytes Relative: 25 %
Lymphs Abs: 1.5 10*3/uL (ref 0.7–4.0)
MCH: 29.2 pg (ref 26.0–34.0)
MCHC: 33.3 g/dL (ref 30.0–36.0)
MCV: 87.8 fL (ref 80.0–100.0)
Monocytes Absolute: 0.6 10*3/uL (ref 0.1–1.0)
Monocytes Relative: 10 %
Neutro Abs: 3.7 10*3/uL (ref 1.7–7.7)
Neutrophils Relative %: 63 %
Platelet Count: 327 10*3/uL (ref 150–400)
RBC: 5.82 MIL/uL — ABNORMAL HIGH (ref 3.87–5.11)
RDW: 13.6 % (ref 11.5–15.5)
WBC Count: 5.9 10*3/uL (ref 4.0–10.5)
nRBC: 0 % (ref 0.0–0.2)

## 2021-05-27 LAB — CMP (CANCER CENTER ONLY)
ALT: 15 U/L (ref 0–44)
AST: 17 U/L (ref 15–41)
Albumin: 4.3 g/dL (ref 3.5–5.0)
Alkaline Phosphatase: 129 U/L — ABNORMAL HIGH (ref 38–126)
Anion gap: 10 (ref 5–15)
BUN: 15 mg/dL (ref 8–23)
CO2: 24 mmol/L (ref 22–32)
Calcium: 9.5 mg/dL (ref 8.9–10.3)
Chloride: 104 mmol/L (ref 98–111)
Creatinine: 0.71 mg/dL (ref 0.44–1.00)
GFR, Estimated: >60 mL/min (ref >60)
Glucose, Bld: 138 mg/dL — ABNORMAL HIGH (ref 70–99)
Potassium: 4.1 mmol/L (ref 3.5–5.1)
Sodium: 138 mmol/L (ref 135–145)
Total Bilirubin: 0.4 mg/dL (ref 0.3–1.2)
Total Protein: 8 g/dL (ref 6.5–8.1)

## 2021-05-27 LAB — CEA (IN HOUSE-CHCC): CEA (CHCC-In House): 1.54 ng/mL (ref 0.00–5.00)

## 2021-05-27 LAB — VITAMIN B12: Vitamin B-12: 148 pg/mL — ABNORMAL LOW (ref 180–914)

## 2021-05-27 LAB — FERRITIN: Ferritin: 38 ng/mL (ref 11–307)

## 2021-05-29 ENCOUNTER — Encounter: Payer: Self-pay | Admitting: Hematology

## 2021-05-29 NOTE — Progress Notes (Signed)
? ? ? ?HEMATOLOGY/ONCOLOGY CLINIC NOTE ? ?Date of Service:  .05/27/2021 ? ?Patient Care Team: ?Lawerance Cruel, MD as PCP - General ?Minus Breeding, MD as PCP - Cardiology (Cardiology) ?Excell Seltzer, MD (Inactive) as Consulting Physician (General Surgery) ?Richmond Campbell, MD as Consulting Physician (Gastroenterology) ?Minus Breeding, MD as Consulting Physician (Cardiology) ?Brunetta Genera, MD as Consulting Physician (Hematology) ? ?CHIEF COMPLAINTS/PURPOSE OF CONSULTATION:  ?Iron deficiency Anemia and B12 deficiency ?F/u for colon cancer ? ?HISTORY OF PRESENTING ILLNESS:  ? ?[name] [name] is a wonderful 88 y.o. female who has been referred to [place] by Dr .Harrington Challenger, Dwyane Luo, MD for evaluation and management of microcytic anemia. ? ?Patient has a h/o HTN, atrial tachycardia, , benign tremor who was noted to have a new severe microcytic anemia and weakness with a drop of hgb down to 7.4 in sept 2017. She was admitted to the hospital and received 1 unit of PRBC. She was noted to have severe new iron deficiency and also noted to have B12 deficiency. Her stool occult blood was apparently neg. Was discharged on PO iron and was taking it for a few months. Rpt stool studies with PCP were again noted to be hemoccult neg. She received B12 IM x 1 in the hospital but has not been on any B12 since then. ?Patient had refused a GI workup in the hospital and was to be seen by Dr Fuller Plan for GI workup in the outpatient setting but has refused and continues to refuse and GI workup. ? ?Denies any overt GI bleeding. No melena no hematemesis no hematochezia. No nausea/vomiting or abdominal pain. ?No significant weight loss recently. ? ?Her hgb improved some on Po iron but then started dropping again and so she was referred to [place] for consideration of IV iron replacement. ? ?On CXR in the hospital she was incidentally noted to have a moderate to large hiatal hernia. ? ? ?INTERVAL HISTORY  ? ?[name] [name] for follow-up of  her colon cancer and iron deficiency anemia.  She notes irritable bowel habits sometimes with looser stools in the setting of previous history of right hemicolectomy. ?Reasonable p.o. intake with progressive weight gain and no reported weight loss. ?Notes no black stools or blood in the stools. ?Labs done today were reviewed with the patient in detail.  CBC shows some polycythemia with a hemoglobin of 17 and hematocrit of 51.1, WBC count of 5.9k and platelets of 327k. ?CMP within normal limits ?CEA within normal limits at 1.54 ?Ferritin improved slightly up to 38 from a previous level of 14. ?B12 deficient with a level of 148 ?Patient does report coughing episodes with p.o. food intake.  Discussed and patient agreeable to referral to speech therapy for swallow testing. ? ? ?MEDICAL HISTORY:  ?Past Medical History:  ?Diagnosis Date  ? Anemia   ? Atrial tachycardia ([place])   ? Cancer [facility])   ? colon  ? CHICKENPOX, HX OF 09/07/2008  ? Qualifier: Diagnosis of  By: Asa Lente MD, Jannifer Rodney   ? Cough   ? yellow to clear sputum cough for a while per pt no fever  ? HCAP (healthcare-associated pneumonia) 10/31/2015  ? History of blood transfusion last transfusion 09-29-17  ? HTN (hypertension)   ? Jaundice due to hepatitis   ? at age 89 or 89  ? Pneumonia 2014  ? Transient global amnesia few yrs ago x 2  ? AMS  ? Tremors of nervous system   ? benign familial  ?Large Hiatal hernia ?  HCAP 10/2015 ?Iron deficiency Anemia ?B12 deficiency. ? ?SURGICAL HISTORY: ?Past Surgical History:  ?Procedure Laterality Date  ? ABDOMINAL HERNIA REPAIR  yrs ago  ? BACK SURGERY    ? years ago lower  ? LAPAROSCOPIC PARTIAL COLECTOMY N/A 11/04/2017  ? Procedure: LAPAROSCOPIC RIGHT HEMICOLECTOMY;  Surgeon: Excell Seltzer, MD;  Location: [facility];  Service: General;  Laterality: N/A;  ? TONSILLECTOMY  age 6  ? ? ?SOCIAL HISTORY: ?Social History  ? ?Socioeconomic History  ? Marital status: Married  ?  Spouse name: Not on file  ? Number of children: Not on  file  ? Years of education: Not on file  ? Highest education level: Not on file  ?Occupational History  ? Not on file  ?Tobacco Use  ? Smoking status: Never  ? Smokeless tobacco: Never  ? Tobacco comments:  ?  + prior 2nd hand exposure from spouse smoking  ?Vaping Use  ? Vaping Use: Never used  ?Substance and Sexual Activity  ? Alcohol use: No  ?  Alcohol/week: 0.0 standard drinks  ? Drug use: No  ? Sexual activity: Never  ?Other Topics Concern  ? Not on file  ?Social History Narrative  ? Not on file  ? ?Social Determinants of Health  ? ?Financial Resource Strain: Not on file  ?Food Insecurity: Not on file  ?Transportation Needs: Not on file  ?Physical Activity: Not on file  ?Stress: Not on file  ?Social Connections: Not on file  ?Intimate Partner Violence: Not on file  ? ? ?FAMILY HISTORY: ?Family History  ?Problem Relation Age of Onset  ? Lung cancer Mother   ? Parkinson's disease Mother   ? Hypertension Mother   ? Other Father   ?     blood clot  ? Arthritis Sister   ? Other Sister   ?     spinal stenosis  ? ? ?ALLERGIES:  is allergic to aspirin, codeine, hydrochlorothiazide, penicillins, sulfa antibiotics, sulfonamide derivatives, biotin, levofloxacin, macrodantin [nitrofurantoin], vitamin b12, vitamin d analogs, and latex. ? ?MEDICATIONS:  ?Current Outpatient Medications  ?Medication Sig Dispense Refill  ? amLODipine (NORVASC) 10 MG tablet Take 1 tablet (10 mg total) by mouth daily. 90 tablet 3  ? cloNIDine (CATAPRES) 0.2 MG tablet Take 1 tablet (0.2 mg total) by mouth 2 (two) times daily. 180 tablet 3  ? ?No current facility-administered medications for this visit.  ? ? ?REVIEW OF SYSTEMS:   ?10 Point review of Systems was done is negative except as noted above. ? ?PHYSICAL EXAMINATION:  ?ECOG PERFORMANCE STATUS: 2 - Symptomatic, <50% confined to bed ? ?Vitals:  ? 05/27/21 1438  ?BP: (!) 145/93  ?Pulse: (!) 123  ?Resp: 18  ?Temp: (!) 97.5 ?F (36.4 ?C)  ?SpO2: 98%  ? ? ?Filed Weights  ? 05/27/21 1438  ?Weight:  195 lb 1.6 oz (88.5 kg)  ? ? ?.Body mass index is 34.56 kg/m?. ?.. ?GENERAL:alert, in no acute distress and comfortable ?SKIN: no acute rashes, no significant lesions ?EYES: conjunctiva are pink and non-injected, sclera anicteric ?OROPHARYNX: MMM, no exudates, no oropharyngeal erythema or ulceration ?NECK: supple, no JVD ?LYMPH:  no palpable lymphadenopathy in the cervical, axillary or inguinal regions ?LUNGS: clear to auscultation b/l with normal respiratory effort ?HEART: regular rate & rhythm ?ABDOMEN:  normoactive bowel sounds , non tender, not distended. ?Extremity: no pedal edema ?PSYCH: alert & oriented x 3 with fluent speech ?NEURO: no focal motor/sensory deficits ? ? ?LABORATORY DATA:  ?I have reviewed the data as listed ? ?. ? ?  Latest Ref Rng & Units 05/27/2021  ?  2:19 PM 09/03/2020  ?  1:01 PM 08/26/2019  ? 11:22 AM  ?CBC  ?WBC 4.0 - 10.5 K/uL 5.9   8.7   6.5    ?Hemoglobin 12.0 - 15.0 g/dL 17.0   14.8   15.2    ?Hematocrit 36.0 - 46.0 % 51.1   45.1   46.1    ?Platelets 150 - 400 K/uL 327   375   304    ? ? ?. ? ?  Latest Ref Rng & Units 05/27/2021  ?  2:19 PM 09/03/2020  ?  1:01 PM 08/26/2019  ? 11:22 AM  ?CMP  ?Glucose 70 - 99 mg/dL 138   228   88    ?BUN 8 - 23 mg/dL '15   13   12    '$ ?Creatinine 0.44 - 1.00 mg/dL 0.71   0.81   0.70    ?Sodium 135 - 145 mmol/L 138   136   137    ?Potassium 3.5 - 5.1 mmol/L 4.1   4.0   4.1    ?Chloride 98 - 111 mmol/L 104   102   106    ?CO2 22 - 32 mmol/L '24   21   20    '$ ?Calcium 8.9 - 10.3 mg/dL 9.5   9.1   9.5    ?Total Protein 6.5 - 8.1 g/dL 8.0   7.6   7.8    ?Total Bilirubin 0.3 - 1.2 mg/dL 0.4   0.4   0.4    ?Alkaline Phos 38 - 126 U/L 129   120   129    ?AST 15 - 41 U/L '17   17   18    '$ ?ALT 0 - 44 U/L '15   15   15    '$ ? ?Lab Results  ?Component Value Date  ? FERRITIN 38 05/27/2021  ? ?B12 -- 148 ? ? ?Component ?    Latest Ref Rng & Units 05/08/2016  ?Parietal Cell Ab ?    0.0 - 20.0 Units 3.5  ?Intrinsic Factor Abs, Serum ?    0.0 - 1.1 AU/mL 0.9  ? ?11/06/17 Surgical  Biopsy: ? ? ? ? ?RADIOGRAPHIC STUDIES: ?I have personally reviewed the radiological images as listed and agreed with the findings in the report. ?No results found. ? ?ASSESSMENT & PLAN:  ? ?88 y.o. [place]

## 2021-05-30 ENCOUNTER — Other Ambulatory Visit (HOSPITAL_COMMUNITY): Payer: Self-pay | Admitting: *Deleted

## 2021-05-30 ENCOUNTER — Telehealth (HOSPITAL_COMMUNITY): Payer: Self-pay

## 2021-05-30 DIAGNOSIS — R1310 Dysphagia, unspecified: Secondary | ICD-10-CM

## 2021-05-30 NOTE — Telephone Encounter (Signed)
Attempted to contact patient to schedule OP MBS - left voicemail. ?

## 2021-06-05 ENCOUNTER — Inpatient Hospital Stay (HOSPITAL_COMMUNITY): Admission: RE | Admit: 2021-06-05 | Payer: MEDICARE | Source: Ambulatory Visit

## 2021-06-05 NOTE — Progress Notes (Signed)
Contacted pt's daughter per Dr Irene Limbo to let her know that the patient is significantly B12 deficient and should continue B12 1000 mcg sublingually over-the-counter daily ?Pt's daughter verbalized understanding and stated she would let the patient know.  ?

## 2021-06-06 ENCOUNTER — Ambulatory Visit (HOSPITAL_COMMUNITY): Payer: MEDICARE

## 2021-06-06 ENCOUNTER — Ambulatory Visit (HOSPITAL_COMMUNITY)
Admission: RE | Admit: 2021-06-06 | Discharge: 2021-06-06 | Disposition: A | Discharge status: Home / Self Care | Payer: MEDICARE | Source: Ambulatory Visit | Attending: Hematology | Admitting: Hematology

## 2021-06-06 DIAGNOSIS — R1310 Dysphagia, unspecified: Secondary | ICD-10-CM

## 2021-06-06 DIAGNOSIS — C180 Malignant neoplasm of cecum: Secondary | ICD-10-CM

## 2021-06-06 NOTE — Progress Notes (Incomplete)
Objective Swallowing Evaluation: Type of Study: MBS-Modified Barium Swallow Study ?  ?Patient Details  ?Name: [name] [name] ?MRN: [id number] ?Date of Birth: [birth year] ? ?Today's Date: 06/06/2021 ?Time: SLP Start Time (ACUTE ONLY): 1443 ?-SLP Stop Time (ACUTE ONLY): 1540 ? ?SLP Time Calculation (min) (ACUTE ONLY): 30 min ? ? ?Past Medical History:  ?Past Medical History:  ?Diagnosis Date  ? Anemia   ? Atrial tachycardia ([place])   ? Cancer [facility])   ? colon  ? CHICKENPOX, HX OF 09/07/2008  ? Qualifier: Diagnosis of  By: Asa Lente MD, Jannifer Rodney   ? Cough   ? yellow to clear sputum cough for a while per pt no fever  ? HCAP (healthcare-associated pneumonia) 10/31/2015  ? History of blood transfusion last transfusion 09-29-17  ? HTN (hypertension)   ? Jaundice due to hepatitis   ? at age 74 or 76  ? Pneumonia 2014  ? Transient global amnesia few yrs ago x 2  ? AMS  ? Tremors of nervous system   ? benign familial  ? ?Past Surgical History:  ?Past Surgical History:  ?Procedure Laterality Date  ? ABDOMINAL HERNIA REPAIR  yrs ago  ? BACK SURGERY    ? years ago lower  ? LAPAROSCOPIC PARTIAL COLECTOMY N/A 11/04/2017  ? Procedure: LAPAROSCOPIC RIGHT HEMICOLECTOMY;  Surgeon: Excell Seltzer, MD;  Location: [facility];  Service: General;  Laterality: N/A;  ? TONSILLECTOMY  age 88  ? ?HPI: 88 yo female referred for swallow evaluation by Dr Irene Limbo due to pt report of coughing with intake. Pt with PMH + for few episodes of amnesia, stroke symptom - imaging 2008 showed 2008 MRI Small chronic infarct is present in the left internal capsule and in the left thalamus. Diffusion-weighted imaging is negative for acute infarct. No mass or fluid collection is identified. Small areas of signal abnormality in the left basal ganglia likely represent asymmetric mineralization or could be small areas of chronic hemorrhage. There is mucosal thickening and possibly some fluid in the sphenoid sinus which may be due to acute sinusitis, 2016 MRI moderate cerebellar  atrophy, large hiatal hernia, bilateral thyroid nodules,  stable scattered small pulmonary nodules both lungs per imaging 2019, HCAP 2017, tremors familial, colon cancer, 2nd hand smoke exposure.  Pt has declined GI work up in the hospital or OP per MD notes.  Pt reports coughing/choking episodes occur approximately once a week - with no specific time or food/drink.  She has not required heimlich manuever, had recently pna nor weight loss. She resides with her daughter [name].  Pt complains of xerostomia, Daughter [name] advises that she helps pt to stand and raise her arms up when she chokes *states [name]'s spouse is on the other side. ?  ?Subjective: pt awake in chair ? ? ? Recommendations for follow up therapy are one component of a multi-disciplinary discharge planning process, led by the attending physician.  Recommendations may be updated based on patient status, additional functional criteria and insurance authorization. ? ?Assessment / Plan / Recommendation ? ? ?  06/06/2021  ?  2:00 PM  ?Clinical Impressions  ?Clinical Impression Pt presents with functional oropharyngeal swallow ability without aspiration nor frank larygneal penetration of any consistency tested *thin, nectar, puree or solid.  She expressed reticense to swallow barium tablet stating she didn't take any pills "that size". She only takes 2 small pills.  Flash penetration of thin cleared independently - and is [facility].  Pt's symptoms were not replicated during testing.  Daughter advises pt  has a hard time swallowing pills, thus advised to consider taking them with pudding (whole) starting and following with liquids. Daughter also advised cold items exacerbate her coughing - thus they provide her with room temperature liquids.   Thanks for this referral.  ?SLP Visit Diagnosis Dysphagia, unspecified (R13.10)  ?Impact on safety and function Mild aspiration risk  ? ?   ?   ? View : No data to display.  ?  ?  ?  ?   ?   ? View : No data to display.  ?  ?  ?   ? ? ? ?  06/06/2021  ?  2:00 PM  ?Diet Recommendations  ?SLP Diet Recommendations Regular solids;Thin liquid  ?Liquid Administration via Cup  ?Medication Administration Other (Comment)  ?Compensations Slow rate;Small sips/bites  ?Postural Changes Remain semi-upright after after feeds/meals (Comment);Seated upright at 90 degrees  ?   ? ?   ? View : No data to display.  ?  ?  ?  ? ? ?   ? View : No data to display.  ?  ?  ?  ?   ? ? ?  06/06/2021  ?  2:00 PM  ?Oral Phase  ?Oral Phase WFL  ?Oral - Nectar Cup [facility]  ?Oral - Thin Cup [facility]  ?Oral - Puree WFL  ?Oral - Mech Soft WFL  ?  ? ?  06/06/2021  ?  2:00 PM  ?Pharyngeal Phase  ?Pharyngeal Phase WFL  ?Pharyngeal- Nectar Cup [facility]  ?Pharyngeal Material does not enter airway  ?Pharyngeal- Thin Cup [facility];Penetration/Aspiration during swallow  ?Pharyngeal Material enters airway, remains ABOVE vocal cords then ejected out  ?Pharyngeal- Puree Delayed swallow initiation-vallecula;WFL  ?Pharyngeal Material does not enter airway  ?Pharyngeal- Mechanical Soft WFL;Delayed swallow initiation-vallecula  ?Pharyngeal Material does not enter airway  ?Pharyngeal Comment Minimal flash penetration of thin liquids observed, pt did not swallow large sequential boluses despite verbal cues.  Minimal vallecular residual with cracker WFL for pt's age.  ?  ? ? ?  06/06/2021  ?  2:00 PM  ?Cervical Esophageal Phase   ?Cervical Esophageal Phase WFL  ? ? ? ?Macario Golds ?06/06/2021, 3:13 PM ?  ? ?   ?   ?   ?   ?   ? ?  ? ?  ?  ?   ?  ? ? ?  ? ? ?

## 2021-06-21 ENCOUNTER — Telehealth: Payer: Self-pay

## 2021-06-21 ENCOUNTER — Other Ambulatory Visit: Payer: Self-pay | Admitting: *Deleted

## 2021-06-21 DIAGNOSIS — D509 Iron deficiency anemia, unspecified: Secondary | ICD-10-CM

## 2021-06-21 DIAGNOSIS — C180 Malignant neoplasm of cecum: Secondary | ICD-10-CM

## 2021-06-21 NOTE — Telephone Encounter (Signed)
Because of WFL modified barium swallow study (MBSS) and outstanding referral for ST, SLP called pt at number on record and spoke with pt's daughter [name] (pt resides with [name]). [name] stated neither she nor her mother had any questions about the MBSS, and that her mother would likely refuse any ST regardless of outcome of MBSS. When [name] asked her mother (pt) if she thought she needed any ST, SLP heard pt, in background, state she does not require/desire ST.  ?Because pt refused ST, SLP will close referral at this time.  ?

## 2021-06-24 ENCOUNTER — Inpatient Hospital Stay: Payer: MEDICARE | Attending: Hematology

## 2021-06-24 ENCOUNTER — Other Ambulatory Visit: Payer: Self-pay

## 2021-06-24 DIAGNOSIS — I10 Essential (primary) hypertension: Secondary | ICD-10-CM | POA: Diagnosis not present

## 2021-06-24 DIAGNOSIS — D509 Iron deficiency anemia, unspecified: Secondary | ICD-10-CM | POA: Insufficient documentation

## 2021-06-24 DIAGNOSIS — Z85038 Personal history of other malignant neoplasm of large intestine: Secondary | ICD-10-CM | POA: Diagnosis not present

## 2021-06-24 DIAGNOSIS — E538 Deficiency of other specified B group vitamins: Secondary | ICD-10-CM | POA: Diagnosis not present

## 2021-06-24 DIAGNOSIS — C180 Malignant neoplasm of cecum: Secondary | ICD-10-CM

## 2021-06-24 LAB — CBC WITH DIFFERENTIAL (CANCER CENTER ONLY)
Abs Immature Granulocytes: 0.02 10*3/uL (ref 0.00–0.07)
Basophils Absolute: 0 10*3/uL (ref 0.0–0.1)
Basophils Relative: 1 %
Eosinophils Absolute: 0.1 10*3/uL (ref 0.0–0.5)
Eosinophils Relative: 1 %
HCT: 50.3 % — ABNORMAL HIGH (ref 36.0–46.0)
Hemoglobin: 16.4 g/dL — ABNORMAL HIGH (ref 12.0–15.0)
Immature Granulocytes: 0 %
Lymphocytes Relative: 22 %
Lymphs Abs: 1.3 10*3/uL (ref 0.7–4.0)
MCH: 28.6 pg (ref 26.0–34.0)
MCHC: 32.6 g/dL (ref 30.0–36.0)
MCV: 87.6 fL (ref 80.0–100.0)
Monocytes Absolute: 0.6 10*3/uL (ref 0.1–1.0)
Monocytes Relative: 9 %
Neutro Abs: 4.1 10*3/uL (ref 1.7–7.7)
Neutrophils Relative %: 67 %
Platelet Count: 357 10*3/uL (ref 150–400)
RBC: 5.74 MIL/uL — ABNORMAL HIGH (ref 3.87–5.11)
RDW: 14.1 % (ref 11.5–15.5)
WBC Count: 6.1 10*3/uL (ref 4.0–10.5)
nRBC: 0 % (ref 0.0–0.2)

## 2021-06-24 LAB — VITAMIN B12: Vitamin B-12: 942 pg/mL — ABNORMAL HIGH (ref 180–914)

## 2021-06-24 LAB — CMP (CANCER CENTER ONLY)
ALT: 13 U/L (ref 0–44)
AST: 15 U/L (ref 15–41)
Albumin: 4.1 g/dL (ref 3.5–5.0)
Alkaline Phosphatase: 142 U/L — ABNORMAL HIGH (ref 38–126)
Anion gap: 9 (ref 5–15)
BUN: 11 mg/dL (ref 8–23)
CO2: 26 mmol/L (ref 22–32)
Calcium: 9.3 mg/dL (ref 8.9–10.3)
Chloride: 103 mmol/L (ref 98–111)
Creatinine: 0.76 mg/dL (ref 0.44–1.00)
GFR, Estimated: >60 mL/min (ref >60)
Glucose, Bld: 166 mg/dL — ABNORMAL HIGH (ref 70–99)
Potassium: 3.9 mmol/L (ref 3.5–5.1)
Sodium: 138 mmol/L (ref 135–145)
Total Bilirubin: 0.4 mg/dL (ref 0.3–1.2)
Total Protein: 7.8 g/dL (ref 6.5–8.1)

## 2021-06-25 LAB — CEA (IN HOUSE-CHCC): CEA (CHCC-In House): 1.57 ng/mL (ref 0.00–5.00)

## 2021-06-25 LAB — FERRITIN: Ferritin: 34 ng/mL (ref 11–307)

## 2021-06-28 DIAGNOSIS — Z6836 Body mass index (BMI) 36.0-36.9, adult: Secondary | ICD-10-CM | POA: Diagnosis not present

## 2021-06-28 DIAGNOSIS — R946 Abnormal results of thyroid function studies: Secondary | ICD-10-CM | POA: Diagnosis not present

## 2021-07-09 ENCOUNTER — Inpatient Hospital Stay (HOSPITAL_BASED_OUTPATIENT_CLINIC_OR_DEPARTMENT_OTHER): Payer: MEDICARE | Admitting: Hematology

## 2021-07-09 DIAGNOSIS — C180 Malignant neoplasm of cecum: Secondary | ICD-10-CM | POA: Diagnosis not present

## 2021-07-09 DIAGNOSIS — D751 Secondary polycythemia: Secondary | ICD-10-CM

## 2021-07-09 DIAGNOSIS — E538 Deficiency of other specified B group vitamins: Secondary | ICD-10-CM

## 2021-07-11 ENCOUNTER — Telehealth: Payer: Self-pay | Admitting: Hematology

## 2021-07-11 NOTE — Telephone Encounter (Signed)
Left message with follow-up appointments per 5/30 los.

## 2021-07-15 ENCOUNTER — Encounter: Payer: Self-pay | Admitting: Hematology

## 2021-07-15 NOTE — Progress Notes (Signed)
HEMATOLOGY/ONCOLOGY CLINIC NOTE  Date of Service:  .07/09/2021  Patient Care Team: Lawerance Cruel, MD as PCP - General Minus Breeding, MD as PCP - Cardiology (Cardiology) Excell Seltzer, MD (Inactive) as Consulting Physician (General Surgery) Richmond Campbell, MD as Consulting Physician (Gastroenterology) Minus Breeding, MD as Consulting Physician (Cardiology) Brunetta Genera, MD as Consulting Physician (Hematology)  CHIEF COMPLAINTS/PURPOSE OF CONSULTATION:  Follow-up for iron deficiency anemia and B12 deficiency Elevated hemoglobin levels  HPI  [name] [name] is a wonderful 88 y.o. female who has been referred to [place] by Dr .Harrington Challenger, Dwyane Luo, MD for evaluation and management of microcytic anemia.  Patient has a h/o HTN, atrial tachycardia, , benign tremor who was noted to have a new severe microcytic anemia and weakness with a drop of hgb down to 7.4 in sept 2017. She was admitted to the hospital and received 1 unit of PRBC. She was noted to have severe new iron deficiency and also noted to have B12 deficiency. Her stool occult blood was apparently neg. Was discharged on PO iron and was taking it for a few months. Rpt stool studies with PCP were again noted to be hemoccult neg. She received B12 IM x 1 in the hospital but has not been on any B12 since then. Patient had refused a GI workup in the hospital and was to be seen by Dr Fuller Plan for GI workup in the outpatient setting but has refused and continues to refuse and GI workup.  Denies any overt GI bleeding. No melena no hematemesis no hematochezia. No nausea/vomiting or abdominal pain. No significant weight loss recently.  Her hgb improved some on Po iron but then started dropping again and so she was referred to [place] for consideration of IV iron replacement.  On CXR in the hospital she was incidentally noted to have a moderate to large hiatal hernia.   INTERVAL HISTORY   .I connected with [name] on  .07/09/2021 at  8:40 AM EDT by telephone visit and verified that I am speaking with the correct person using two identifiers.   I discussed the limitations, risks, security and privacy concerns of performing an evaluation and management service by telemedicine and the availability of in-person appointments. I also discussed with the patient that there may be a patient responsible charge related to this service. The patient expressed understanding and agreed to proceed.   Other persons participating in the visit and their role in the encounter: Patient's daughter  Patient's location: Home Provider's location: [facility]  Chief Complaint: Follow-up for labs  Patient notes no acute new symptoms since her last clinic visit.  Her repeat labs to monitor her for resolution of polycythemia were discussed with her in detail.  She was seen and evaluated by speech pathologist and the recommendations were reviewed.  MEDICAL HISTORY:  Past Medical History:  Diagnosis Date   Anemia    Atrial tachycardia ([place])    Cancer ([place])    colon   CHICKEN[place], HX OF 09/07/2008   Qualifier: Diagnosis of  By: Asa Lente MD, Mateo Flow A    Cough    yellow to clear sputum cough for a while per pt no fever   HCAP (healthcare-associated pneumonia) 10/31/2015   History of blood transfusion last transfusion 09-29-17   HTN (hypertension)    Jaundice due to hepatitis    at age 89 or 55   Pneumonia 2014   Transient global amnesia few yrs ago x 2  AMS   Tremors of nervous system    benign familial  Large Hiatal hernia HCAP 10/2015 Iron deficiency Anemia B12 deficiency.  SURGICAL HISTORY: Past Surgical History:  Procedure Laterality Date   ABDOMINAL HERNIA REPAIR  yrs ago   BACK SURGERY     years ago lower   LAPAROSCOPIC PARTIAL COLECTOMY N/A 11/04/2017   Procedure: LAPAROSCOPIC RIGHT HEMICOLECTOMY;  Surgeon: Excell Seltzer, MD;  Location: [facility];  Service: General;  Laterality: N/A;   TONSILLECTOMY   age 64    SOCIAL HISTORY: Social History   Socioeconomic History   Marital status: Married    Spouse name: Not on file   Number of children: Not on file   Years of education: Not on file   Highest education level: Not on file  Occupational History   Not on file  Tobacco Use   Smoking status: Never   Smokeless tobacco: Never   Tobacco comments:    + prior 2nd hand exposure from spouse smoking  Vaping Use   Vaping Use: Never used  Substance and Sexual Activity   Alcohol use: No    Alcohol/week: 0.0 standard drinks   Drug use: No   Sexual activity: Never  Other Topics Concern   Not on file  Social History Narrative   Not on file   Social Determinants of Health   Financial Resource Strain: Not on file  Food Insecurity: Not on file  Transportation Needs: Not on file  Physical Activity: Not on file  Stress: Not on file  Social Connections: Not on file  Intimate Partner Violence: Not on file    FAMILY HISTORY: Family History  Problem Relation Age of Onset   Lung cancer Mother    Parkinson's disease Mother    Hypertension Mother    Other Father        blood clot   Arthritis Sister    Other Sister        spinal stenosis    ALLERGIES:  is allergic to aspirin, codeine, hydrochlorothiazide, penicillins, sulfa antibiotics, sulfonamide derivatives, biotin, levofloxacin, macrodantin [nitrofurantoin], vitamin b12, vitamin d analogs, and latex.  MEDICATIONS:  Current Outpatient Medications  Medication Sig Dispense Refill   amLODipine (NORVASC) 10 MG tablet Take 1 tablet (10 mg total) by mouth daily. 90 tablet 3   cloNIDine (CATAPRES) 0.2 MG tablet Take 1 tablet (0.2 mg total) by mouth 2 (two) times daily. 180 tablet 3   No current facility-administered medications for this visit.    REVIEW OF SYSTEMS:   10 Point review of Systems was done is negative except as noted above.  PHYSICAL EXAMINATION:  Telemedicine visit  LABORATORY DATA:  I have reviewed the data as  listed  .    Latest Ref Rng & Units 06/24/2021    1:43 PM 05/27/2021    2:19 PM 09/03/2020    1:01 PM  CBC  WBC 4.0 - 10.5 K/uL 6.1   5.9   8.7    Hemoglobin 12.0 - 15.0 g/dL 16.4   17.0   14.8    Hematocrit 36.0 - 46.0 % 50.3   51.1   45.1    Platelets 150 - 400 K/uL 357   327   375      .    Latest Ref Rng & Units 06/24/2021    1:43 PM 05/27/2021    2:19 PM 09/03/2020    1:01 PM  CMP  Glucose 70 - 99 mg/dL 166   138   228  BUN 8 - 23 mg/dL '11   15   13    '$ Creatinine 0.44 - 1.00 mg/dL 0.76   0.71   0.81    Sodium 135 - 145 mmol/L 138   138   136    Potassium 3.5 - 5.1 mmol/L 3.9   4.1   4.0    Chloride 98 - 111 mmol/L 103   104   102    CO2 22 - 32 mmol/L '26   24   21    '$ Calcium 8.9 - 10.3 mg/dL 9.3   9.5   9.1    Total Protein 6.5 - 8.1 g/dL 7.8   8.0   7.6    Total Bilirubin 0.3 - 1.2 mg/dL 0.4   0.4   0.4    Alkaline Phos 38 - 126 U/L 142   129   120    AST 15 - 41 U/L '15   17   17    '$ ALT 0 - 44 U/L '13   15   15     '$ Lab Results  Component Value Date   FERRITIN 34 06/24/2021   B12 -- 148   Component     Latest Ref Rng & Units 05/08/2016  Parietal Cell Ab     0.0 - 20.0 Units 3.5  Intrinsic Factor Abs, Serum     0.0 - 1.1 AU/mL 0.9   11/06/17 Surgical Biopsy:     RADIOGRAPHIC STUDIES: I have personally reviewed the radiological images as listed and agreed with the findings in the report. No results found.  ASSESSMENT & PLAN:   88 y.o. caucasian female with   1) Severe Microcytic Anemia s/p PRBc transfusion in sept 2017.  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 B12 levels are back down to 148.  Patient with lack of compliance with B12 replacement.  PLAN -I discussed goal is to maintain Ferritin > 100 and  iron saturation at 20%.  -continue vit B complex 1 tab po daily to support accelerated hematopoiesis.  4.  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   11/04/17 Surgical pathology revealed 9.2cm invasive colorectal adenocarcinoma, clear margins, and three implicated lymph nodes in metastasis, with N1B status.    12/25/17 CT C/A/P revealed  Stable appearance of scattered small pulmonary nodules within both lungs. These remain indeterminate. Recommend continued interval follow-up to ensure stability of these lesions. 2. No mass or adenopathy identified within the chest, abdomen or Pelvis. 3. Stable right adrenal nodule which may represent a benign adenoma. 4.  Aortic Atherosclerosis (ICD10-I70.0). 5. Large hiatal hernia 6. Bilateral thyroid nodules. Nodule in the left lobe demonstrated mild increased FDG uptake on previous PET-CT. Consider further evaluation with thyroid sonogram.  Patient is status post laparoscopic right hemicolectomy by Dr. Excell Seltzer on 11/04/2017.  She had chosen to not pursue any adjuvant chemotherapy.  PLAN:  -Patient's labs done on 06/24/2021 were reviewed in details CBC shows improvement in her hemoglobin which is down to 16.4 with hematocrit of 58 down from 51.1.  No leukocytosis no thrombocytosis. -Patient notes no change in  bowel habits or rectal bleeding. We will monitor her hemoglobin and hematocrit and if persistently elevated despite good hydration we will consider getting clonal studies in the future. -Continue B12 replacement 1000 mcg of B12 daily -Patient was unable to complete her modified barium swallow but not noted to have any overt aspiration and nonspecific recommendations from speech pathology at this time other than general aspiration risk reduction techniques.   4)  . Patient  Active Problem List   Diagnosis Date Noted   Educated about COVID-19 virus infection 09/28/2019   Leg swelling 01/28/2018   Bradycardia 01/28/2018   Primary cancer of cecum pT3, pN1b s/p lap colectomy 11/04/2017 11/04/2017   Chronic idiopathic constipation 04/27/2017   Iron deficiency anemia 10/20/2015   B12 deficiency anemia 10/20/2015   Symptomatic anemia 10/19/2015   Allergy 08/17/2015   UTI (urinary tract infection) 07/26/2014   TGA (transient global amnesia) 07/25/2014   Cardiac dysrhythmia 02/06/2010   PALPITATIONS 02/06/2010   Essential hypertension, benign 05/22/2008   PSVT 03/27/2008   OBESITY, UNSPECIFIED 03/26/2008   Abnormal involuntary movement 03/26/2008   AMNESIA, TRANSIENT GLOBAL 01/10/2007  -advised continued f/u with PCP for management of her other medical co-morbids   FOLLOW UP: Phone visit with Dr. Irene Limbo in 4 months Labs 2 weeks prior to phone visit   The total time spent in the appointment was 15 minutes*.  All of the patient's questions were answered with apparent satisfaction. The patient knows to call the clinic with any problems, questions or concerns.   Sullivan Lone MD MS AAHIVMS [facility] Hematology/Oncology Physician [facility]  .*Total Encounter Time as defined by the Centers for Medicare and Medicaid Services includes, in addition to the face-to-face time of a patient visit (documented in the note above) non-face-to-face time: obtaining and reviewing outside history, ordering and reviewing medications, tests or procedures, care coordination (communications with other health care professionals or caregivers) and documentation in the medical record.

## 2021-08-22 ENCOUNTER — Encounter: Payer: Self-pay | Admitting: Emergency Medicine

## 2021-08-22 ENCOUNTER — Inpatient Hospital Stay
Admission: EM | Admit: 2021-08-22 | Discharge: 2021-08-25 | DRG: 310 | Disposition: A | Discharge status: Home Health | Payer: MEDICARE | Attending: Internal Medicine | Admitting: Internal Medicine

## 2021-08-22 ENCOUNTER — Inpatient Hospital Stay: Payer: MEDICARE

## 2021-08-22 ENCOUNTER — Emergency Department: Payer: MEDICARE

## 2021-08-22 DIAGNOSIS — E538 Deficiency of other specified B group vitamins: Secondary | ICD-10-CM | POA: Diagnosis present

## 2021-08-22 DIAGNOSIS — Z888 Allergy status to other drugs, medicaments and biological substances status: Secondary | ICD-10-CM | POA: Diagnosis not present

## 2021-08-22 DIAGNOSIS — Z881 Allergy status to other antibiotic agents status: Secondary | ICD-10-CM | POA: Diagnosis not present

## 2021-08-22 DIAGNOSIS — R918 Other nonspecific abnormal finding of lung field: Secondary | ICD-10-CM | POA: Diagnosis not present

## 2021-08-22 DIAGNOSIS — R001 Bradycardia, unspecified: Secondary | ICD-10-CM | POA: Diagnosis present

## 2021-08-22 DIAGNOSIS — I480 Paroxysmal atrial fibrillation: Secondary | ICD-10-CM | POA: Diagnosis present

## 2021-08-22 DIAGNOSIS — J189 Pneumonia, unspecified organism: Secondary | ICD-10-CM | POA: Diagnosis not present

## 2021-08-22 DIAGNOSIS — Z20822 Contact with and (suspected) exposure to covid-19: Secondary | ICD-10-CM | POA: Diagnosis present

## 2021-08-22 DIAGNOSIS — Z882 Allergy status to sulfonamides status: Secondary | ICD-10-CM

## 2021-08-22 DIAGNOSIS — Z880 Allergy status to penicillin: Secondary | ICD-10-CM

## 2021-08-22 DIAGNOSIS — I4891 Unspecified atrial fibrillation: Secondary | ICD-10-CM

## 2021-08-22 DIAGNOSIS — D509 Iron deficiency anemia, unspecified: Secondary | ICD-10-CM | POA: Diagnosis present

## 2021-08-22 DIAGNOSIS — Z91040 Latex allergy status: Secondary | ICD-10-CM

## 2021-08-22 DIAGNOSIS — Z886 Allergy status to analgesic agent status: Secondary | ICD-10-CM

## 2021-08-22 DIAGNOSIS — Z9049 Acquired absence of other specified parts of digestive tract: Secondary | ICD-10-CM | POA: Diagnosis not present

## 2021-08-22 DIAGNOSIS — Z885 Allergy status to narcotic agent status: Secondary | ICD-10-CM

## 2021-08-22 DIAGNOSIS — Z66 Do not resuscitate: Secondary | ICD-10-CM | POA: Diagnosis present

## 2021-08-22 DIAGNOSIS — Z79899 Other long term (current) drug therapy: Secondary | ICD-10-CM | POA: Diagnosis not present

## 2021-08-22 DIAGNOSIS — J168 Pneumonia due to other specified infectious organisms: Secondary | ICD-10-CM | POA: Diagnosis not present

## 2021-08-22 DIAGNOSIS — R0689 Other abnormalities of breathing: Secondary | ICD-10-CM | POA: Diagnosis not present

## 2021-08-22 DIAGNOSIS — R9431 Abnormal electrocardiogram [ECG] [EKG]: Secondary | ICD-10-CM | POA: Diagnosis not present

## 2021-08-22 DIAGNOSIS — R000 Tachycardia, unspecified: Secondary | ICD-10-CM | POA: Diagnosis not present

## 2021-08-22 DIAGNOSIS — R251 Tremor, unspecified: Secondary | ICD-10-CM | POA: Diagnosis present

## 2021-08-22 DIAGNOSIS — Z85038 Personal history of other malignant neoplasm of large intestine: Secondary | ICD-10-CM | POA: Diagnosis not present

## 2021-08-22 DIAGNOSIS — D6489 Other specified anemias: Secondary | ICD-10-CM | POA: Diagnosis not present

## 2021-08-22 DIAGNOSIS — I471 Supraventricular tachycardia: Principal | ICD-10-CM | POA: Diagnosis present

## 2021-08-22 DIAGNOSIS — I10 Essential (primary) hypertension: Secondary | ICD-10-CM | POA: Diagnosis present

## 2021-08-22 DIAGNOSIS — Z8249 Family history of ischemic heart disease and other diseases of the circulatory system: Secondary | ICD-10-CM | POA: Diagnosis not present

## 2021-08-22 DIAGNOSIS — F32A Depression, unspecified: Secondary | ICD-10-CM | POA: Diagnosis not present

## 2021-08-22 DIAGNOSIS — I517 Cardiomegaly: Secondary | ICD-10-CM | POA: Diagnosis not present

## 2021-08-22 LAB — CBC WITH DIFFERENTIAL/PLATELET
Abs Immature Granulocytes: 0.08 10*3/uL — ABNORMAL HIGH (ref 0.00–0.07)
Basophils Absolute: 0 10*3/uL (ref 0.0–0.1)
Basophils Relative: 0 %
Eosinophils Absolute: 0 10*3/uL (ref 0.0–0.5)
Eosinophils Relative: 0 %
HCT: 48.7 % — ABNORMAL HIGH (ref 36.0–46.0)
Hemoglobin: 15.9 g/dL — ABNORMAL HIGH (ref 12.0–15.0)
Immature Granulocytes: 1 %
Lymphocytes Relative: 3 %
Lymphs Abs: 0.4 10*3/uL — ABNORMAL LOW (ref 0.7–4.0)
MCH: 28.8 pg (ref 26.0–34.0)
MCHC: 32.6 g/dL (ref 30.0–36.0)
MCV: 88.1 fL (ref 80.0–100.0)
Monocytes Absolute: 0.9 10*3/uL (ref 0.1–1.0)
Monocytes Relative: 5 %
Neutro Abs: 15.2 10*3/uL — ABNORMAL HIGH (ref 1.7–7.7)
Neutrophils Relative %: 91 %
Platelets: 307 10*3/uL (ref 150–400)
RBC: 5.53 MIL/uL — ABNORMAL HIGH (ref 3.87–5.11)
RDW: 13.5 % (ref 11.5–15.5)
WBC: 16.6 10*3/uL — ABNORMAL HIGH (ref 4.0–10.5)
nRBC: 0 % (ref 0.0–0.2)

## 2021-08-22 LAB — COMPREHENSIVE METABOLIC PANEL
ALT: 17 U/L (ref 0–44)
AST: 29 U/L (ref 15–41)
Albumin: 3.9 g/dL (ref 3.5–5.0)
Alkaline Phosphatase: 117 U/L (ref 38–126)
Anion gap: 9 (ref 5–15)
BUN: 13 mg/dL (ref 8–23)
CO2: 22 mmol/L (ref 22–32)
Calcium: 8.6 mg/dL — ABNORMAL LOW (ref 8.9–10.3)
Chloride: 104 mmol/L (ref 98–111)
Creatinine, Ser: 0.73 mg/dL (ref 0.44–1.00)
GFR, Estimated: >60 mL/min (ref >60)
Glucose, Bld: 140 mg/dL — ABNORMAL HIGH (ref 70–99)
Potassium: 4.2 mmol/L (ref 3.5–5.1)
Sodium: 135 mmol/L (ref 135–145)
Total Bilirubin: 1.5 mg/dL — ABNORMAL HIGH (ref 0.3–1.2)
Total Protein: 7.4 g/dL (ref 6.5–8.1)

## 2021-08-22 LAB — TROPONIN I (HIGH SENSITIVITY): Troponin I (High Sensitivity): 6 ng/L (ref <18)

## 2021-08-22 LAB — SARS CORONAVIRUS 2 BY RT PCR: SARS Coronavirus 2 by RT PCR: NEGATIVE

## 2021-08-22 LAB — LACTIC ACID, PLASMA: Lactic Acid, Venous: 2 mmol/L (ref 0.5–1.9)

## 2021-08-22 MED ORDER — HEPARIN SODIUM (PORCINE) 5000 UNIT/ML IJ SOLN
5000.0000 [IU] | Freq: Three times a day (TID) | INTRAMUSCULAR | Status: DC
  Administered 2021-08-22 – 2021-08-25 (×9): 5000 [IU] via SUBCUTANEOUS
  Filled 2021-08-22 (×9): qty 1

## 2021-08-22 MED ORDER — ACETAMINOPHEN 325 MG PO TABS
650.0000 mg | ORAL_TABLET | Freq: Once | ORAL | Status: AC
Start: 2021-08-22 — End: 2021-08-22
  Administered 2021-08-22: 650 mg via ORAL
  Filled 2021-08-22: qty 2

## 2021-08-22 MED ORDER — VANCOMYCIN HCL IN DEXTROSE 1-5 GM/200ML-% IV SOLN
1000.0000 mg | Freq: Once | INTRAVENOUS | Status: AC
  Administered 2021-08-22: 1000 mg via INTRAVENOUS
  Filled 2021-08-22: qty 200

## 2021-08-22 MED ORDER — LACTATED RINGERS IV BOLUS
1000.0000 mL | Freq: Once | INTRAVENOUS | Status: AC
  Administered 2021-08-22: 1000 mL via INTRAVENOUS

## 2021-08-22 MED ORDER — METRONIDAZOLE 500 MG/100ML IV SOLN
500.0000 mg | Freq: Once | INTRAVENOUS | Status: AC
  Administered 2021-08-22: 500 mg via INTRAVENOUS
  Filled 2021-08-22: qty 100

## 2021-08-22 MED ORDER — SODIUM CHLORIDE 0.9 % IV SOLN
2.0000 g | Freq: Three times a day (TID) | INTRAVENOUS | Status: DC
  Administered 2021-08-22 – 2021-08-23 (×2): 2 g via INTRAVENOUS
  Filled 2021-08-22 (×2): qty 10

## 2021-08-22 NOTE — ED Provider Notes (Signed)
[facility] Provider Note    Event Date/Time   First MD Initiated Contact with Patient 08/22/21 1745     (approximate)   History   Fever   HPI  [name] [name] is a 88 y.o. female who presents to the emergency department today because of concerns for some fevers and chills today.  Patient states normally her temperature runs around 96 but today it was 99.  She has also had chills.  Patient states she has had some cough but she has allergies.  She has been taking Flonase for that.  Patient denies any nausea or vomiting.  Denies any chest pain denies any change in urination.      Physical Exam   Triage Vital Signs: ED Triage Vitals  Enc Vitals Group     BP 08/22/21 1751 (!) 148/88     Pulse Rate 08/22/21 1751 93     Resp 08/22/21 1752 (!) 26     Temp 08/22/21 1753 99.1 F (37.3 C)     Temp Source 08/22/21 1753 Oral     SpO2 08/22/21 1751 94 %     Weight --      Height --      Head Circumference --      Peak Flow --      Pain Score --      Pain Loc --      Pain Edu? --      Excl. in [place]? --     Most recent vital signs: Vitals:   08/22/21 1830 08/22/21 2000  BP: (!) 134/92 (!) 119/91  Pulse: 82 87  Resp: (!) 25 (!) 28  Temp:  99.2 F (37.3 C)  SpO2: 94% 94%   General: Awake, alert, oriented. CV:  Good peripheral perfusion. Irregular rhythm. Resp:  Normal effort. Lungs clear. Abd:  No distention.     ED Results / Procedures / Treatments   Labs (all labs ordered are listed, but only abnormal results are displayed) Labs Reviewed  LACTIC ACID, PLASMA - Abnormal; Notable for the following components:      Result Value   Lactic Acid, Venous 2.0 (*)    All other components within normal limits  COMPREHENSIVE METABOLIC PANEL - Abnormal; Notable for the following components:   Glucose, Bld 140 (*)    Calcium 8.6 (*)    Total Bilirubin 1.5 (*)    All other components within normal limits  CBC WITH DIFFERENTIAL/PLATELET - Abnormal;  Notable for the following components:   WBC 16.6 (*)    RBC 5.53 (*)    Hemoglobin 15.9 (*)    HCT 48.7 (*)    Neutro Abs 15.2 (*)    Lymphs Abs 0.4 (*)    Abs Immature Granulocytes 0.08 (*)    All other components within normal limits  CULTURE, BLOOD (ROUTINE X 2)  CULTURE, BLOOD (ROUTINE X 2)  URINE CULTURE  SARS CORONAVIRUS 2 BY RT PCR  URINALYSIS, COMPLETE (UACMP) WITH MICROSCOPIC     EKG  I, Nance Pear, attending physician, personally viewed and interpreted this EKG  EKG Time: 1752 Rate: 112 Rhythm: atrial fibrillation Axis: right axis deviation Intervals: qtc 454 QRS: narrow ST changes: no st elevation Impression: abnormal ekg   RADIOLOGY I independently interpreted and visualized the CXR. My interpretation: Cardiomegaly.  Radiology interpretation:  IMPRESSION:  Cardiomegaly, vascular congestion.    Right infrahilar opacity concerning for pneumonia.   PROCEDURES:  Critical Care performed: No  Procedures   MEDICATIONS  ORDERED IN ED: Medications  aztreonam (AZACTAM) 2 g in sodium chloride 0.9 % 100 mL IVPB (has no administration in time range)  vancomycin (VANCOCIN) IVPB 1000 mg/200 mL premix (has no administration in time range)  acetaminophen (TYLENOL) tablet 650 mg (has no administration in time range)  metroNIDAZOLE (FLAGYL) IVPB 500 mg (500 mg Intravenous New Bag/Given 08/22/21 1901)  lactated ringers bolus 1,000 mL (1,000 mLs Intravenous New Bag/Given 08/22/21 1902)     IMPRESSION / MDM / ASSESSMENT AND PLAN / ED COURSE  I reviewed the triage vital signs and the nursing notes.                              Differential diagnosis includes, but is not limited to, pneumonia, UTI, viral illness.  Patient's presentation is most consistent with acute presentation with potential threat to life or bodily function.  Patient presented to the emergency department today because of concerns for fevers.  Patient was afebrile here in the emergency  department however patient states her temperature normally runs low.  Blood work however is concerning given leukocytosis and slight lactic acidosis.  Additionally EKG here is concerning for atrial fibrillation.  Family states patient does not have any history of atrial fibrillation.  Patient was given broad-spectrum antibiotics.  Chest x-ray is concerning for pneumonia.  I do think this could be the source of the patient's symptoms.  Discussed with Dr. Marcello Moores with the hospitalist service who will plan on admission.     FINAL CLINICAL IMPRESSION(S) / ED DIAGNOSES   Final diagnoses:  Pneumonia due to infectious organism, unspecified laterality, unspecified part of lung  Atrial fibrillation, unspecified type [facility])     Note:  This document was prepared using Dragon voice recognition software and may include unintentional dictation errors.    Nance Pear, MD 08/22/21 2027

## 2021-08-22 NOTE — ED Triage Notes (Addendum)
BIB EMS from home.  Family concerned d/t pt feeling week and having fevers at home.  Intermittent afib RVR with EMS with no previous afib dx.  Pt denies CP, SHOB, dizziness.  No cardiac hx.  BP 152/70,  20G RH.  250 NS bolus.  CBG 141.  Refused tylenol.  Temp 99.4 with EMS.   Pt does report on arrival that she has "heart rate issues"

## 2021-08-22 NOTE — H&P (Addendum)
History and Physical    [name] [name] ZRA:[id number] DOB: May 27, 1927 DOA: 08/22/2021  PCP: Lawerance Cruel, MD  Patient coming from: home  I have personally briefly reviewed patient's old medical records in [place]  Chief Complaint: fever /generalized weakness  HPI: [name] [name] is a 88 y.o. female with medical history significant of  Anemia associated with Iron/B12 deficiency, Atrial tachycardia/Paroxsymal SVT/hx of bradycardia,Allergy with chronic cough,  colon ca s/p hemicolectomy followed by oncology , HTN,tremors , who present to ED with generalized weakness and fever/chills/ flushed appearance. All symptoms that started this am per daughter. Patients denies n/v/d/abdominal pain or dysuria but does have hx of urinary incontinence. IN the field patient BP 152/70, Temp 99.4,  give was given 250 cc bolus enroute.    ED Course:  Vitals:  Afeb, bp 148,88, hr 93, sat 94% on ra  rr 26 Labs Lactic 2 Na : 135, K 4.2, cr 0.73, glu 140, cr 0.73 CE 6,  Wbc:16.6  hgb 15.9, plt 307  pmn 91  Cxr: IMPRESSION: Cardiomegaly, vascular congestion.   Right infrahilar opacity concerning for pneumonia.  LKT:GYBW rate 112  Review of Systems: As per HPI otherwise 10 point review of systems negative.   Past Medical History:  Diagnosis Date   Anemia    Atrial tachycardia ([place])    Cancer ([place])    colon   [place]EN[place], HX OF 09/07/2008   Qualifier: Diagnosis of  By: Asa Lente MD, Mateo Flow A    Cough    yellow to clear sputum cough for a while per pt no fever   HCAP (healthcare-associated pneumonia) 10/31/2015   History of blood transfusion last transfusion 09-29-17   HTN (hypertension)    Jaundice due to hepatitis    at age 44 or 61   Pneumonia 2014   Transient global amnesia few yrs ago x 2   AMS   Tremors of nervous system    benign familial    Past Surgical History:  Procedure Laterality Date   ABDOMINAL HERNIA REPAIR  yrs ago   BACK SURGERY     years ago lower    LAPAROSCOPIC PARTIAL COLECTOMY N/A 11/04/2017   Procedure: LAPAROSCOPIC RIGHT HEMICOLECTOMY;  Surgeon: Excell Seltzer, MD;  Location: [facility];  Service: General;  Laterality: N/A;   TONSILLECTOMY  age 81     reports that she has never smoked. She has never used smokeless tobacco. She reports that she does not drink alcohol and does not use drugs.  Allergies  Allergen Reactions   Aspirin Hives   Codeine Other (See Comments)    REACTION: GI upset   Hydrochlorothiazide Rash   Penicillins Hives, Shortness Of Breath, Rash and Other (See Comments)    Has patient had a PCN reaction causing immediate rash, facial/tongue/throat swelling, SOB or lightheadedness with hypotension: yes Has patient had a PCN reaction causing severe rash involving mucus membranes or skin necrosis: unknown Has patient had a PCN reaction that required hospitalization : unknown Has patient had a PCN reaction occurring within the last 10 years: no If all of the above answers are "NO", then may proceed with Cephalosporin use.    Sulfa Antibiotics Rash   Sulfonamide Derivatives Rash   Biotin Other (See Comments)    Mouth sores   Levofloxacin Other (See Comments)    Joint aches and weakness   Macrodantin [Nitrofurantoin] Other (See Comments)    Unknown reaction   Vitamin B12 Hives   Vitamin D Analogs Hives   Latex  Rash    Family History  Problem Relation Age of Onset   Lung cancer Mother    Parkinson's disease Mother    Hypertension Mother    Other Father        blood clot   Arthritis Sister    Other Sister        spinal stenosis    Prior to Admission medications   Medication Sig Start Date End Date Taking? Authorizing Provider  amLODipine (NORVASC) 10 MG tablet Take 1 tablet (10 mg total) by mouth daily. 10/05/20   Minus Breeding, MD  cloNIDine (CATAPRES) 0.2 MG tablet Take 1 tablet (0.2 mg total) by mouth 2 (two) times daily. 10/05/20   Minus Breeding, MD    Physical Exam: Vitals:   08/22/21 1753  08/22/21 1800 08/22/21 1830 08/22/21 2000  BP:  131/86 (!) 134/92 (!) 119/91  Pulse:  92 82 87  Resp:  20 (!) 25 (!) 28  Temp: 99.1 F (37.3 C)   99.2 F (37.3 C)  TempSrc: Oral   Oral  SpO2:  95% 94% 94%    Vitals:   08/22/21 1753 08/22/21 1800 08/22/21 1830 08/22/21 2000  BP:  131/86 (!) 134/92 (!) 119/91  Pulse:  92 82 87  Resp:  20 (!) 25 (!) 28  Temp: 99.1 F (37.3 C)   99.2 F (37.3 C)  TempSrc: Oral   Oral  SpO2:  95% 94% 94%   Constitutional: NAD, calm, comfortable Eyes: PERRL, lids and conjunctivae normal ENMT: Mucous membranes are moist. Posterior pharynx clear of any exudate or lesions.Normal dentition.  Neck: normal, supple, no masses, no thyromegaly Respiratory: clear to auscultation bilaterally, no wheezing, no crackles. Normal respiratory effort. No accessory muscle use.  Cardiovascular: iRegular rhythm, no murmurs / rubs / gallops. +extremity edema. 2+ pedal pulses.   Abdomen: no tenderness, no masses palpated. No hepatosplenomegaly. Bowel sounds positive.  Musculoskeletal: no clubbing / cyanosis. No joint deformity upper and lower extremities. Good ROM, no contractures. Normal muscle tone.  Skin: no rashes, lesions, ulcers. No induration Neurologic: CN 2-12 grossly intact. Sensation intact,  Strength 5/5 in all 4.  Psychiatric: Normal judgment and insight. Alert and oriented x 3. Normal mood.    Labs on Admission: I have personally reviewed following labs and imaging studies  CBC: Recent Labs  Lab 08/22/21 1802  WBC 16.6*  NEUTROABS 15.2*  HGB 15.9*  HCT 48.7*  MCV 88.1  PLT 409   Basic Metabolic Panel: Recent Labs  Lab 08/22/21 1802  NA 135  K 4.2  CL 104  CO2 22  GLUCOSE 140*  BUN 13  CREATININE 0.73  CALCIUM 8.6*   GFR: CrCl cannot be calculated (Unknown ideal weight.). Liver Function Tests: Recent Labs  Lab 08/22/21 1802  AST 29  ALT 17  ALKPHOS 117  BILITOT 1.5*  PROT 7.4  ALBUMIN 3.9   No results for input(s): "LIPASE",  "AMYLASE" in the last 168 hours. No results for input(s): "AMMONIA" in the last 168 hours. Coagulation Profile: No results for input(s): "INR", "PROTIME" in the last 168 hours. Cardiac Enzymes: No results for input(s): "CKTOTAL", "CKMB", "CKMBINDEX", "TROPONINI" in the last 168 hours. BNP (last 3 results) No results for input(s): "PROBNP" in the last 8760 hours. HbA1C: No results for input(s): "HGBA1C" in the last 72 hours. CBG: No results for input(s): "GLUCAP" in the last 168 hours. Lipid Profile: No results for input(s): "CHOL", "HDL", "LDLCALC", "TRIG", "CHOLHDL", "LDLDIRECT" in the last 72 hours. Thyroid Function Tests: No  results for input(s): "TSH", "T4TOTAL", "FREET4", "T3FREE", "THYROIDAB" in the last 72 hours. Anemia Panel: No results for input(s): "VITAMINB12", "FOLATE", "FERRITIN", "TIBC", "IRON", "RETICCTPCT" in the last 72 hours. Urine analysis:    Component Value Date/Time   COLORURINE YELLOW 10/14/2017 1400   APPEARANCEUR HAZY (A) 10/14/2017 1400   LABSPEC 1.014 10/14/2017 1400   PHURINE 5.0 10/14/2017 1400   GLUCOSEU NEGATIVE 10/14/2017 1400   HGBUR NEGATIVE 10/14/2017 1400   BILIRUBINUR NEGATIVE 10/14/2017 1400   KETONESUR NEGATIVE 10/14/2017 1400   PROTEINUR NEGATIVE 10/14/2017 1400   UROBILINOGEN 0.2 07/25/2014 1505   NITRITE POSITIVE (A) 10/14/2017 1400   LEUKOCYTESUR LARGE (A) 10/14/2017 1400    Radiological Exams on Admission: DG Chest Port 1 View  Result Date: 08/22/2021 CLINICAL DATA:  Questionable sepsis EXAM: PORTABLE CHEST 1 VIEW COMPARISON:  10/31/2015 FINDINGS: Cardiomegaly, vascular congestion. Focal infrahilar opacity noted on the right concerning for pneumonia. No confluent opacity on the left. No effusions or acute bony abnormality. IMPRESSION: Cardiomegaly, vascular congestion. Right infrahilar opacity concerning for pneumonia. Electronically Signed   By: Rolm Baptise M.D.   On: 08/22/2021 18:22    EKG: Independently reviewed. See  above  Assessment/Plan  CAP  r/o Aspiration PNA - no noted  hypoxemia - hx of  aspiration episodes ( coughing with meals)  -admit to tele -place on CAP protocol -check full respiratory panel -pulmonary toilet , nebs prn  - f/u urine ag/ blood cultures  -de-escalate abx as able  -SLP to see  -CT scan for further evalaution  Atrial Tachycardia  PSVT  Bradycardia  New Onset Afib -followed by Dr Percival Spanish  -off bb due to hx of symptomatic bradycardia  - now with  new onset afib  rate mid 90 t0 110 -patient is not interested in long term anticoagulation  -consider cardiology consult regarding re-starting bb  -patient currently has cvr   HTN  -resume home regimen /amlodipine/ clonidine  Hx of Colon CA -followed by oncology   DVT prophylaxis: heparin Code Status: DNR Disposition Plan: patient  expected to be admitted greater than 2 midnights g Consults called:  Admission status: med tele   Clance Boll MD [facility]   If 7PM-7AM, please contact night-coverage [email or web address] Password [facility]  08/22/2021, 8:19 PM

## 2021-08-23 ENCOUNTER — Other Ambulatory Visit: Payer: Self-pay

## 2021-08-23 DIAGNOSIS — I10 Essential (primary) hypertension: Secondary | ICD-10-CM | POA: Diagnosis not present

## 2021-08-23 DIAGNOSIS — D6489 Other specified anemias: Secondary | ICD-10-CM

## 2021-08-23 DIAGNOSIS — I480 Paroxysmal atrial fibrillation: Secondary | ICD-10-CM | POA: Diagnosis not present

## 2021-08-23 DIAGNOSIS — I4891 Unspecified atrial fibrillation: Secondary | ICD-10-CM | POA: Diagnosis not present

## 2021-08-23 LAB — URINALYSIS, COMPLETE (UACMP) WITH MICROSCOPIC
Bilirubin Urine: NEGATIVE
Glucose, UA: 50 mg/dL — AB
Ketones, ur: NEGATIVE mg/dL
Nitrite: NEGATIVE
Protein, ur: NEGATIVE mg/dL
Specific Gravity, Urine: 1.012 (ref 1.005–1.030)
pH: 6 (ref 5.0–8.0)

## 2021-08-23 LAB — COMPREHENSIVE METABOLIC PANEL
ALT: 18 U/L (ref 0–44)
AST: 24 U/L (ref 15–41)
Albumin: 3.4 g/dL — ABNORMAL LOW (ref 3.5–5.0)
Alkaline Phosphatase: 101 U/L (ref 38–126)
Anion gap: 8 (ref 5–15)
BUN: 9 mg/dL (ref 8–23)
CO2: 20 mmol/L — ABNORMAL LOW (ref 22–32)
Calcium: 8.2 mg/dL — ABNORMAL LOW (ref 8.9–10.3)
Chloride: 108 mmol/L (ref 98–111)
Creatinine, Ser: 0.66 mg/dL (ref 0.44–1.00)
GFR, Estimated: >60 mL/min (ref >60)
Glucose, Bld: 126 mg/dL — ABNORMAL HIGH (ref 70–99)
Potassium: 3.3 mmol/L — ABNORMAL LOW (ref 3.5–5.1)
Sodium: 136 mmol/L (ref 135–145)
Total Bilirubin: 1 mg/dL (ref 0.3–1.2)
Total Protein: 6.9 g/dL (ref 6.5–8.1)

## 2021-08-23 LAB — RESPIRATORY PANEL BY PCR

## 2021-08-23 LAB — CBC WITH DIFFERENTIAL/PLATELET
Abs Immature Granulocytes: 0.03 10*3/uL (ref 0.00–0.07)
Basophils Absolute: 0 10*3/uL (ref 0.0–0.1)
Basophils Relative: 0 %
Eosinophils Absolute: 0 10*3/uL (ref 0.0–0.5)
Eosinophils Relative: 0 %
HCT: 45.8 % (ref 36.0–46.0)
Hemoglobin: 14.6 g/dL (ref 12.0–15.0)
Immature Granulocytes: 0 %
Lymphocytes Relative: 5 %
Lymphs Abs: 0.5 10*3/uL — ABNORMAL LOW (ref 0.7–4.0)
MCH: 28.5 pg (ref 26.0–34.0)
MCHC: 31.9 g/dL (ref 30.0–36.0)
MCV: 89.5 fL (ref 80.0–100.0)
Monocytes Absolute: 0.8 10*3/uL (ref 0.1–1.0)
Monocytes Relative: 7 %
Neutro Abs: 10.4 10*3/uL — ABNORMAL HIGH (ref 1.7–7.7)
Neutrophils Relative %: 88 %
Platelets: 220 10*3/uL (ref 150–400)
RBC: 5.12 MIL/uL — ABNORMAL HIGH (ref 3.87–5.11)
RDW: 13.6 % (ref 11.5–15.5)
WBC: 11.8 10*3/uL — ABNORMAL HIGH (ref 4.0–10.5)
nRBC: 0 % (ref 0.0–0.2)

## 2021-08-23 LAB — LACTIC ACID, PLASMA
Lactic Acid, Venous: 1.7 mmol/L (ref 0.5–1.9)
Lactic Acid, Venous: 1.5 mmol/L (ref 0.5–1.9)

## 2021-08-23 LAB — STREP PNEUMONIAE URINARY ANTIGEN: Strep Pneumo Urinary Antigen: NEGATIVE

## 2021-08-23 LAB — TSH: TSH: 3.607 u[IU]/mL (ref 0.350–4.500)

## 2021-08-23 LAB — HIV ANTIBODY (ROUTINE TESTING W REFLEX): HIV Screen 4th Generation wRfx: NONREACTIVE

## 2021-08-23 LAB — PROCALCITONIN: Procalcitonin: 0.17 ng/mL

## 2021-08-23 MED ORDER — FLUTICASONE PROPIONATE 50 MCG/ACT NA SUSP
1.0000 | Freq: Every day | NASAL | Status: DC
Start: 2021-08-24 — End: 2021-08-24

## 2021-08-23 MED ORDER — SODIUM CHLORIDE 0.9 % IV SOLN: INTRAVENOUS | Status: AC

## 2021-08-23 MED ORDER — METRONIDAZOLE 500 MG/100ML IV SOLN: 500.0000 mg | Freq: Two times a day (BID) | INTRAVENOUS | Status: DC

## 2021-08-23 MED ORDER — FLORANEX PO PACK
1.0000 g | PACK | Freq: Three times a day (TID) | ORAL | Status: DC
  Administered 2021-08-23 (×2): 1 g via ORAL
  Filled 2021-08-23 (×5): qty 1

## 2021-08-23 MED ORDER — AMLODIPINE BESYLATE 10 MG PO TABS
10.0000 mg | ORAL_TABLET | Freq: Every day | ORAL | Status: DC
  Administered 2021-08-23 – 2021-08-25 (×3): 10 mg via ORAL
  Filled 2021-08-23: qty 1
  Filled 2021-08-23: qty 2
  Filled 2021-08-23: qty 1

## 2021-08-23 MED ORDER — CLONIDINE HCL 0.1 MG PO TABS
0.2000 mg | ORAL_TABLET | Freq: Two times a day (BID) | ORAL | Status: DC
  Administered 2021-08-23 – 2021-08-25 (×5): 0.2 mg via ORAL
  Filled 2021-08-23 (×5): qty 2

## 2021-08-23 NOTE — Consult Note (Signed)
Cardiology Consultation:   Patient ID: [name] [name] MRN: [id number]; DOB: [birth year]  Admit date: 08/22/2021 Date of Consult: 08/23/2021  PCP:  Lawerance Cruel, MD   [facility] Cardiologist:  Minus Breeding, MD        Patient Profile:   [name] [name] is a 88 y.o. female with a hx of anemia, atrial tachycardia, colon cancer, hypertension, pneumonia, tremors, transient global amnesia, chronic allergies with chronic cough from postnasal drip who is being seen 08/23/2021 for the evaluation of atrial fibrillation at the request of Dr. Marcello Moores.  History of Present Illness:   [name] [name] is a 88 year old female with a complex past medical history as listed above significant for anemia associated with iron/B12 deficiency, atrial tachycardia/paroxysmal SVT, history of bradycardia with beta-blockers, chronic allergic rhinitis with chronic cough and postnasal drip, colon cancer status post hemicolectomy followed by oncology, hypertension, tremors, transient global amnesia, who presented to the emergency department with generalized weakness, fever, chills, and flushed appearance.  According to her daughter all symptoms started this a.m. where the patient was running a low-grade fever around 99.  According to her daughter her temperature typically does run low so any elevation with her is classified as a fever.  Patient subsequently denies any chest pain, shortness of breath, or palpitations.  She endorses chronic cough from postnasal drip and chronic swelling to her bilateral lower extremities.  Initial vitals: Blood pressure 144/88, pulse 93, respiratory 26, temperature 99.1, SPO2 94%  Pertinent labs: Lactic acid 2.0, glucose 140, calcium 8.6, total bilirubin 1.5, WBCs 16.6, hemoglobin 15.9, hematocrit 48.7, TSH 3.607, procalcitonin 0.17, respiratory panel negative, high-sensitivity troponin 6  Imaging: CXR shows cardiomegaly, vascular congestion, right infrahilar opacity concerning  for pneumonia.  CT of the chest reveals large hiatal hernia, progressive dilatation of the pulmonary trunk, mild coronary artery calcification, mild descending thoracic aortic aneurysm measuring 3.2 cm in diameter, stable 3 mm right upper lobe pulmonary nodule  Medications given: Azactam 2 g IVPB, vancomycin 1000 mg IVPB, Tylenol 650 mg, Flagyl 500 mg IV PV, lactated Ringer's 1 L bolus  Past Medical History:  Diagnosis Date   Anemia    Atrial tachycardia (HCC)    Cancer (HCC)    colon   CHICKENPOX, HX OF 09/07/2008   Qualifier: Diagnosis of  By: Asa Lente MD, Mateo Flow A    Cough    yellow to clear sputum cough for a while per pt no fever   HCAP (healthcare-associated pneumonia) 10/31/2015   History of blood transfusion last transfusion 09-29-17   HTN (hypertension)    Jaundice due to hepatitis    at age 13 or 25   Pneumonia 2014   Transient global amnesia few yrs ago x 2   AMS   Tremors of nervous system    benign familial    Past Surgical History:  Procedure Laterality Date   ABDOMINAL HERNIA REPAIR  yrs ago   BACK SURGERY     years ago lower   LAPAROSCOPIC PARTIAL COLECTOMY N/A 11/04/2017   Procedure: LAPAROSCOPIC RIGHT HEMICOLECTOMY;  Surgeon: Excell Seltzer, MD;  Location: [facility];  Service: General;  Laterality: N/A;   TONSILLECTOMY  age 65     Home Medications:  Prior to Admission medications   Medication Sig Start Date End Date Taking? Authorizing Provider  amLODipine (NORVASC) 10 MG tablet Take 10 mg by mouth daily.   Yes [provider]  cloNIDine (CATAPRES) 0.2 MG tablet Take 0.2 mg by mouth 2 (two) times daily.  Yes [provider]  fluticasone (FLONASE) 50 MCG/ACT nasal spray Place 1 spray into both nostrils daily. 08/15/21  Yes [provider]    Inpatient Medications: Scheduled Meds:  amLODipine  10 mg Oral Daily   cloNIDine  0.2 mg Oral BID   [START ON 08/24/2021] fluticasone  1 spray Each Nare Daily   heparin  5,000 Units  Subcutaneous Q8H   lactobacillus  1 g Oral TID WC   Continuous Infusions:  sodium chloride     PRN Meds:   Allergies:    Allergies  Allergen Reactions   Aspirin Hives   Codeine Other (See Comments)    REACTION: GI upset   Hydrochlorothiazide Rash   Penicillins Hives, Shortness Of Breath, Rash and Other (See Comments)    Has patient had a PCN reaction causing immediate rash, facial/tongue/throat swelling, SOB or lightheadedness with hypotension: yes Has patient had a PCN reaction causing severe rash involving mucus membranes or skin necrosis: unknown Has patient had a PCN reaction that required hospitalization : unknown Has patient had a PCN reaction occurring within the last 10 years: no If all of the above answers are "NO", then may proceed with Cephalosporin use.    Sulfa Antibiotics Rash   Sulfonamide Derivatives Rash   Biotin Other (See Comments)    Mouth sores   Levofloxacin Other (See Comments)    Joint aches and weakness   Macrodantin [Nitrofurantoin] Other (See Comments)    Unknown reaction   Vitamin B12 Hives   Vitamin D Analogs Hives   Latex Rash    Social History:   Social History   Socioeconomic History   Marital status: Widowed    Spouse name: Not on file   Number of children: Not on file   Years of education: Not on file   Highest education level: Not on file  Occupational History   Not on file  Tobacco Use   Smoking status: Never   Smokeless tobacco: Never   Tobacco comments:    + prior 2nd hand exposure from spouse smoking  Vaping Use   Vaping Use: Never used  Substance and Sexual Activity   Alcohol use: No    Alcohol/week: 0.0 standard drinks of alcohol   Drug use: No   Sexual activity: Never  Other Topics Concern   Not on file  Social History Narrative   Not on file   Social Determinants of Health   Financial Resource Strain: Not on file  Food Insecurity: Not on file  Transportation Needs: Not on file  Physical Activity: Not on  file  Stress: Not on file  Social Connections: Not on file  Intimate Partner Violence: Not on file    Family History:    Family History  Problem Relation Age of Onset   Lung cancer Mother    Parkinson's disease Mother    Hypertension Mother    Other Father        blood clot   Arthritis Sister    Other Sister        spinal stenosis     ROS:  Please see the history of present illness.  Review of Systems  Constitutional:  Positive for malaise/fatigue.  HENT:         Chronic post nasal drip  Eyes: Negative.   Respiratory:  Positive for cough.   Cardiovascular:  Positive for leg swelling.  Gastrointestinal:  Positive for heartburn.  Genitourinary:  Positive for urgency.  Musculoskeletal: Negative.   Skin: Negative.  Neurological:  Positive for weakness.  Endo/Heme/Allergies: Negative.   Psychiatric/Behavioral: Negative.      All other ROS reviewed and negative.     Physical Exam/Data:   Vitals:   08/23/21 0700 08/23/21 0753 08/23/21 0945 08/23/21 1120  BP:  (!) 155/92 (!) 169/95 134/73  Pulse: 92 91 93 81  Resp: (!) 38 (!) 27 (!) 24 (!) 22  Temp:      TempSrc:      SpO2: 93% 93% 95% 93%    Intake/Output Summary (Last 24 hours) at 08/23/2021 1135 Last data filed at 08/23/2021 0746 Gross per 24 hour  Intake 100 ml  Output --  Net 100 ml      05/27/2021    2:38 PM 10/05/2020    1:04 PM 09/03/2020    1:19 PM  Last 3 Weights  Weight (lbs) 195 lb 1.6 oz 191 lb 9.6 oz 186 lb 14.4 oz  Weight (kg) 88.497 kg 86.909 kg 84.777 kg     There is no height or weight on file to calculate BMI.  General:  Well nourished, well developed, in no acute distress HEENT: normal, glasses on Neck: no JVD Vascular: No carotid bruits; Distal pulses 2+ bilaterally Cardiac:  normal S1, S2; irregularly irregular; II/VI systolic murmur without rubs or gallops   Lungs:  clear to auscultation bilaterally, no wheezing, rhonchi or rales  Abd: soft, nontender, no hepatomegaly, bowel sounds  present in all 4 quadrants Ext: 2+ edema to bilateral lower extremities Musculoskeletal:  No deformities, BUE and BLE strength normal and equal Skin: warm and dry  Neuro:  CNs 2-12 intact, no focal abnormalities noted Psych:  Normal affect   EKG:  The EKG was personally reviewed and demonstrates: Rate controlled atrial fibrillation with a rate of 110 Telemetry:  Telemetry was personally reviewed and demonstrates: Rate controlled atrial fibrillation with rates 80s to 90s  Relevant CV Studies: Echocardiogram completed 07/27/2017 Left ventricle: The cavity size was normal. Wall thickness was    normal. Systolic function was normal. The estimated ejection    fraction was in the range of 60% to 65%. Wall motion was normal;    there were no regional wall motion abnormalities. Doppler    parameters are consistent with high ventricular filling pressure.  - Aortic valve: Valve area (VTI): 2.37 cm^2. Valve area (Vmax):    2.59 cm^2. Valve area (Vmean): 2.35 cm^2.  - Mitral valve: Calcified annulus.  - Left atrium: The atrium was moderately dilated.  - Right ventricle: The cavity size was mildly dilated.  - Right atrium: The atrium was moderately dilated.  - Atrial septum: There was an atrial septal aneurysm.  - Pulmonary arteries: Systolic pressure was moderately increased.    PA peak pressure: 46 mm Hg (S).   Laboratory Data:  High Sensitivity Troponin:   Recent Labs  Lab 08/22/21 1802  TROPONINIHS 6     Chemistry Recent Labs  Lab 08/22/21 1802 08/23/21 0349  NA 135 136  K 4.2 3.3*  CL 104 108  CO2 22 20*  GLUCOSE 140* 126*  BUN 13 9  CREATININE 0.73 0.66  CALCIUM 8.6* 8.2*  GFRNONAA >60 >60  ANIONGAP 9 8    Recent Labs  Lab 08/22/21 1802 08/23/21 0349  PROT 7.4 6.9  ALBUMIN 3.9 3.4*  AST 29 24  ALT 17 18  ALKPHOS 117 101  BILITOT 1.5* 1.0   Lipids No results for input(s): "CHOL", "TRIG", "HDL", "LABVLDL", "LDLCALC", "CHOLHDL" in the last 168 hours.  Hematology Recent Labs  Lab 08/22/21 1802 08/23/21 0349  WBC 16.6* 11.8*  RBC 5.53* 5.12*  HGB 15.9* 14.6  HCT 48.7* 45.8  MCV 88.1 89.5  MCH 28.8 28.5  MCHC 32.6 31.9  RDW 13.5 13.6  PLT 307 220   Thyroid  Recent Labs  Lab 08/23/21 0348  TSH 3.607    BNPNo results for input(s): "BNP", "PROBNP" in the last 168 hours.  DDimer No results for input(s): "DDIMER" in the last 168 hours.   Radiology/Studies:  CT Chest Wo Contrast  Result Date: 08/22/2021 CLINICAL DATA:  Abnormal xray - lung opacity/opacities EXAM: CT CHEST WITHOUT CONTRAST TECHNIQUE: Multidetector CT imaging of the chest was performed following the standard protocol without IV contrast. RADIATION DOSE REDUCTION: This exam was performed according to the departmental dose-optimization program which includes automated exposure control, adjustment of the mA and/or kV according to patient size and/or use of iterative reconstruction technique. COMPARISON:  06/28/2017 FINDINGS: Cardiovascular: Mild coronary artery calcification. Global cardiac size is mildly enlarged, stable since prior examination. No pericardial effusion. Central pulmonary arteries are markedly enlarged in keeping with changes of pulmonary arterial hypertension. Since the prior examination, there has been progressive dilation of the pulmonary trunk, now measuring 4.1 cm in diameter (previously measuring 3.6 cm when measured in similar fashion). Moderate atherosclerotic calcification of the a thoracic aorta. The descending thoracic aorta is mildly dilated measuring 3.2 cm in diameter within the arch beyond the takeoff of the left subclavian artery. This appears mildly progressive since prior examination where this measured 3.0 cm. Mediastinum/Nodes: Visualized thyroid is unremarkable. No pathologic thoracic adenopathy. Small fluid within the distal esophagus may relate to gastroesophageal reflux or esophageal dysmotility. Large hiatal hernia again noted which  appears similar size to prior examination and accounts for the infrahilar opacity noted on recent chest radiograph. Lungs/Pleura: Stable 3 mm pulmonary nodule within the right upper lobe, safely considered benign given its stability over time. Mild bibasilar atelectasis. Lungs are otherwise clear. No pneumothorax or pleural effusion. Central airways are widely patent. Upper Abdomen: Cortical lesion within the interpolar region of the right kidney is incompletely included on this examination and not well assessed, but was previously characterized as a simple cyst on CT of 12/25/2017. No acute abnormality. Musculoskeletal: No acute bone abnormality. Osseous structures are age-appropriate. No lytic or blastic bone lesion. IMPRESSION: 1. Large hiatal hernia, similar in size to prior examination and accounting for the infrahilar opacity noted on recent chest radiograph. 2. Progressive dilation of the pulmonary trunk, now measuring 4.1 cm in diameter, in keeping with changes of progressive pulmonary arterial hypertension. 3. Mild coronary artery calcification. 4. Mild descending thoracic aortic aneurysm measuring 3.2 cm in diameter, mildly progressive since prior examination where this measured 3.0 cm. Recommend annual imaging followup by CTA or MRA. This recommendation follows 2010 ACCF/AHA/AATS/ACR/ASA/SCA/SCAI/SIR/STS/SVM Guidelines for the Diagnosis and Management of Patients with Thoracic Aortic Disease. Circulation.2010; 121: Q469-G295. Aortic aneurysm NOS (ICD10-I71.9) 5. Stable 3 mm right upper lobe pulmonary nodule, safely considered benign given its stability over time. Aortic Atherosclerosis (ICD10-I70.0). Electronically Signed   By: Fidela Salisbury M.D.   On: 08/22/2021 21:59   DG Chest Port 1 View  Result Date: 08/22/2021 CLINICAL DATA:  Questionable sepsis EXAM: PORTABLE CHEST 1 VIEW COMPARISON:  10/31/2015 FINDINGS: Cardiomegaly, vascular congestion. Focal infrahilar opacity noted on the right concerning  for pneumonia. No confluent opacity on the left. No effusions or acute bony abnormality. IMPRESSION: Cardiomegaly, vascular congestion. Right infrahilar opacity concerning for pneumonia. Electronically Signed  By: Rolm Baptise M.D.   On: 08/22/2021 18:22     Assessment and Plan:   Atrial tachycardia/PSVT,/bradycardia/new onset rate controlled atrial fibrillation -Of note patient's primary cardiologist is Dr. Percival Spanish -Patient with known history of atrial tachycardia but no known history of atrial fibrillation -Echocardiogram ordered and pending -Remains in atrial fibrillation that is rate controlled -She is currently not on any beta-blocker therapy due to history of symptomatic bradycardia from previously taken beat blockers -She is not currently interested in long-term anticoagulation even with a CHA2DS2-VASc score of 4 -We will revisit discussion on anticoagulation and risk of stroke but there is also concern with her and history of anemia, ambulatory dysfunction, and age -Patient may need to be considered for watchman as she may be unable to take anticoagulants as well -Beta-blocker therapy not restarted at this time with her rate being controlled, calcium channel blockers would likely not be a good option for her as well with her moderate amount of chronic lower extremity edema that she has refused diuretic therapy for  Hypertension -Blood pressure this morning 155/92 -Continue PTA medications of clonidine and amlodipine for adequate blood pressure control -Vital signs per unit protocol  History of colon cancer -Patient does have bouts of diarrhea with heme colectomy surgery for cancer previously -She continues to be followed by oncology  Microcytic anemia/iron deficiency/B12 deficiency -Hemoglobin of 14.6 this morning -Last blood transfusion was 2017 -She follows with heme-onc outpatient   Risk Assessment/Risk Scores:          CHA2DS2-VASc Score = 4   This indicates a 4.8%  annual risk of stroke. The patient's score is based upon: CHF History: 0 HTN History: 1 Diabetes History: 0 Stroke History: 0 Vascular Disease History: 0 Age Score: 2 Gender Score: 1         For questions or updates, please contact [place] Please consult [email or web address] for contact info under    Signed, Mearle Drew, NP  08/23/2021 11:35 AM

## 2021-08-23 NOTE — ED Notes (Signed)
Breakfast ordered 

## 2021-08-23 NOTE — Progress Notes (Signed)
PROGRESS NOTE    [name] [name]  SAY:[id number] DOB: October 01, 1927 DOA: 08/22/2021 PCP: [name] Cruel, MD    Brief Narrative:  88 y.o. female with medical history significant of  Anemia associated with Iron/B12 deficiency, Atrial tachycardia/Paroxsymal SVT/hx of bradycardia,Allergy with chronic cough,  colon ca s/p hemicolectomy followed by oncology , HTN,tremors , who present to ED with generalized weakness and fever/chills/ flushed appearance. All symptoms that started this am per daughter. Patients denies n/v/d/abdominal pain or dysuria but does have hx of urinary incontinence. IN the field patient BP 152/70, Temp 99.4    Assessment & Plan:   Principal Problem:   CAP (community acquired pneumonia)  Generalized weakness Subjective fevers/chills Symptoms started the morning of admission.  Initially there was concern for pneumonia/infectious process.  This was ruled out.  No pneumonia seen on chest CT.  Negative procalcitonin.  No fever since admission.  I do not believe this is infectious in nature and believe her symptoms are more rate likely related to underlying arrhythmia.  SLP evaluated patient.  No signs or symptoms of aspiration Plan: Discontinue antibiotics Monitor vitals and fever curve Blood cultures as needed fever Follow-up respiratory viral panel  Atrial tachyarrhythmia History of PSVT History of symptomatic bradycardia New onset atrial fibrillation with controlled rate Patient's primary cardiologist is Dr. Percival Spanish.  Patient with a known history of atrial tachycardia but not atrial fibrillation.  Currently rate controlled.  No chest pain or palpitations endorsed. Plan: Seen by cardiology.  At this point no plans for rate control as patient is auto rate controlled.  She is a good candidate for anticoagulation CHA2DS2-VASc is 4 however patient is very reluctant to start anticoagulation at this time.  If she does agree to anticoagulation recommend Eliquis 5 mg twice  daily.  For now we will order echocardiogram and review.  Continue telemetry monitoring for next 24 hours.  Essential hypertension PTA clonidine and amlodipine  History of colon cancer Outpatient follow-up with oncology  Microcytic anemia Iron deficiency anemia B12 deficiency anemia Hemoglobin stable today No indication for transfusion   DVT prophylaxis: SQ heparin Code Status: Full Family Communication: Daughter at bedside 7/14 Disposition Plan: Status is: Inpatient Remains inpatient appropriate because: New onset atrial fibrillation   Level of care: Telemetry Medical  Consultants:  Cardiology  Procedures:  None  Antimicrobials: None   Subjective: Seen and examined.  Resting comfortably in bed.  No visible distress.  No pain complaints.  Daughter at bedside.  Objective: Vitals:   08/23/21 0945 08/23/21 1120 08/23/21 1200 08/23/21 1336  BP: (!) 169/95 134/73 138/90 132/81  Pulse: 93 81 83 82  Resp: (!) 24 (!) 22 (!) 22 17  Temp:    98.5 F (36.9 C)  TempSrc:      SpO2: 95% 93%  94%    Intake/Output Summary (Last 24 hours) at 08/23/2021 1353 Last data filed at 08/23/2021 0746 Gross per 24 hour  Intake 100 ml  Output --  Net 100 ml   There were no vitals filed for this visit.  Examination:  General exam: Appears calm and comfortable.  Resting tremor Respiratory system: Clear to auscultation. Respiratory effort normal. Cardiovascular system: S1-S2, regular rate, irregular rhythm, no murmurs, 1+ pedal edema Gastrointestinal system: Soft, NT/ND, normal bowel sounds Central nervous system: Alert and oriented. No focal neurological deficits. Extremities: Symmetric 5 x 5 power. Skin: No rashes, lesions or ulcers Psychiatry: Judgement and insight appear normal. Mood & affect appropriate.     Data Reviewed: I have  personally reviewed following labs and imaging studies  CBC: Recent Labs  Lab 08/22/21 1802 08/23/21 0349  WBC 16.6* 11.8*  NEUTROABS  15.2* 10.4*  HGB 15.9* 14.6  HCT 48.7* 45.8  MCV 88.1 89.5  PLT 307 585   Basic Metabolic Panel: Recent Labs  Lab 08/22/21 1802 08/23/21 0349  NA 135 136  K 4.2 3.3*  CL 104 108  CO2 22 20*  GLUCOSE 140* 126*  BUN 13 9  CREATININE 0.73 0.66  CALCIUM 8.6* 8.2*   GFR: CrCl cannot be calculated (Unknown ideal weight.). Liver Function Tests: Recent Labs  Lab 08/22/21 1802 08/23/21 0349  AST 29 24  ALT 17 18  ALKPHOS 117 101  BILITOT 1.5* 1.0  PROT 7.4 6.9  ALBUMIN 3.9 3.4*   No results for input(s): "LIPASE", "AMYLASE" in the last 168 hours. No results for input(s): "AMMONIA" in the last 168 hours. Coagulation Profile: No results for input(s): "INR", "PROTIME" in the last 168 hours. Cardiac Enzymes: No results for input(s): "CKTOTAL", "CKMB", "CKMBINDEX", "TROPONINI" in the last 168 hours. BNP (last 3 results) No results for input(s): "PROBNP" in the last 8760 hours. HbA1C: No results for input(s): "HGBA1C" in the last 72 hours. CBG: No results for input(s): "GLUCAP" in the last 168 hours. Lipid Profile: No results for input(s): "CHOL", "HDL", "LDLCALC", "TRIG", "CHOLHDL", "LDLDIRECT" in the last 72 hours. Thyroid Function Tests: Recent Labs    08/23/21 0348  TSH 3.607   Anemia Panel: No results for input(s): "VITAMINB12", "FOLATE", "FERRITIN", "TIBC", "IRON", "RETICCTPCT" in the last 72 hours. Sepsis Labs: Recent Labs  Lab 08/22/21 1751 08/23/21 0301 08/23/21 0348 08/23/21 0641  PROCALCITON  --   --  0.17  --   LATICACIDVEN 2.0* 1.7  --  1.5    Recent Results (from the past 240 hour(s))  Blood Culture (routine x 2)     Status: None (Preliminary result)   Collection Time: 08/22/21  6:02 PM   Specimen: BLOOD LEFT FOREARM  Result Value Ref Range Status   Specimen Description BLOOD LEFT FOREARM  Final   Special Requests   Final    BOTTLES DRAWN AEROBIC AND ANAEROBIC Blood Culture results may not be optimal due to an inadequate volume of blood  received in culture bottles   Culture   Final    NO GROWTH < 12 HOURS Performed at [facility], [address] [place], [place] [address]    Report Status PENDING  Incomplete  SARS Coronavirus 2 by RT PCR (hospital order, performed in [place] hospital lab) *cepheid single result test* Anterior Nasal Swab     Status: None   Collection Time: 08/22/21  8:23 PM   Specimen: Anterior Nasal Swab  Result Value Ref Range Status   SARS Coronavirus 2 by RT PCR NEGATIVE NEGATIVE Final    Comment: (NOTE) SARS-CoV-2 target nucleic acids are NOT DETECTED.  The SARS-CoV-2 RNA is generally detectable in upper and lower respiratory specimens during the acute phase of infection. The lowest concentration of SARS-CoV-2 viral copies this assay can detect is 250 copies / mL. A negative result does not preclude SARS-CoV-2 infection and should not be used as the sole basis for treatment or other patient management decisions.  A negative result may occur with improper specimen collection / handling, submission of specimen other than nasopharyngeal swab, presence of viral mutation(s) within the areas targeted by this assay, and inadequate number of viral copies (<250 copies / mL). A negative result must be combined with  clinical observations, patient history, and epidemiological information.  Fact Sheet for Patients:   https://www.patel.info/  Fact Sheet for Healthcare Providers: https://hall.com/  This test is not yet approved or  cleared by the [place] FDA and has been authorized for detection and/or diagnosis of SARS-CoV-2 by FDA under an Emergency Use Authorization (EUA).  This EUA will remain in effect (meaning this test can be used) for the duration of the COVID-19 declaration under Section 564(b)(1) of the Act, 21 U.S.C. section 360bbb-3(b)(1), unless the authorization is terminated or revoked sooner.  Performed at [facility], [address] [place], [place] [address]          Radiology Studies: CT Chest Wo Contrast  Result Date: 08/22/2021 CLINICAL DATA:  Abnormal xray - lung opacity/opacities EXAM: CT CHEST WITHOUT CONTRAST TECHNIQUE: Multidetector CT imaging of the chest was performed following the standard protocol without IV contrast. RADIATION DOSE REDUCTION: This exam was performed according to the departmental dose-optimization program which includes automated exposure control, adjustment of the mA and/or kV according to patient size and/or use of iterative reconstruction technique. COMPARISON:  06/28/2017 FINDINGS: Cardiovascular: Mild coronary artery calcification. Global cardiac size is mildly enlarged, stable since prior examination. No pericardial effusion. Central pulmonary arteries are markedly enlarged in keeping with changes of pulmonary arterial hypertension. Since the prior examination, there has been progressive dilation of the pulmonary trunk, now measuring 4.1 cm in diameter (previously measuring 3.6 cm when measured in similar fashion). Moderate atherosclerotic calcification of the a thoracic aorta. The descending thoracic aorta is mildly dilated measuring 3.2 cm in diameter within the arch beyond the takeoff of the left subclavian artery. This appears mildly progressive since prior examination where this measured 3.0 cm. Mediastinum/Nodes: Visualized thyroid is unremarkable. No pathologic thoracic adenopathy. Small fluid within the distal esophagus may relate to gastroesophageal reflux or esophageal dysmotility. Large hiatal hernia again noted which appears similar size to prior examination and accounts for the infrahilar opacity noted on recent chest radiograph. Lungs/Pleura: Stable 3 mm pulmonary nodule within the right upper lobe, safely considered benign given its stability over time. Mild bibasilar atelectasis. Lungs are otherwise clear. No pneumothorax or pleural effusion. Central airways  are widely patent. Upper Abdomen: Cortical lesion within the interpolar region of the right kidney is incompletely included on this examination and not well assessed, but was previously characterized as a simple cyst on CT of 12/25/2017. No acute abnormality. Musculoskeletal: No acute bone abnormality. Osseous structures are age-appropriate. No lytic or blastic bone lesion. IMPRESSION: 1. Large hiatal hernia, similar in size to prior examination and accounting for the infrahilar opacity noted on recent chest radiograph. 2. Progressive dilation of the pulmonary trunk, now measuring 4.1 cm in diameter, in keeping with changes of progressive pulmonary arterial hypertension. 3. Mild coronary artery calcification. 4. Mild descending thoracic aortic aneurysm measuring 3.2 cm in diameter, mildly progressive since prior examination where this measured 3.0 cm. Recommend annual imaging followup by CTA or MRA. This recommendation follows 2010 ACCF/AHA/AATS/ACR/ASA/SCA/SCAI/SIR/STS/SVM Guidelines for the Diagnosis and Management of Patients with Thoracic Aortic Disease. Circulation.2010; 121: Y782-N562. Aortic aneurysm NOS (ICD10-I71.9) 5. Stable 3 mm right upper lobe pulmonary nodule, safely considered benign given its stability over time. Aortic Atherosclerosis (ICD10-I70.0). Electronically Signed   By: Fidela Salisbury M.D.   On: 08/22/2021 21:59   DG Chest Port 1 View  Result Date: 08/22/2021 CLINICAL DATA:  Questionable sepsis EXAM: PORTABLE CHEST 1 VIEW COMPARISON:  10/31/2015 FINDINGS: Cardiomegaly, vascular congestion. Focal infrahilar opacity noted on  the right concerning for pneumonia. No confluent opacity on the left. No effusions or acute bony abnormality. IMPRESSION: Cardiomegaly, vascular congestion. Right infrahilar opacity concerning for pneumonia. Electronically Signed   By: Rolm Baptise M.D.   On: 08/22/2021 18:22        Scheduled Meds:  amLODipine  10 mg Oral Daily   cloNIDine  0.2 mg Oral BID    [START ON 08/24/2021] fluticasone  1 spray Each Nare Daily   heparin  5,000 Units Subcutaneous Q8H   lactobacillus  1 g Oral TID WC   Continuous Infusions:  sodium chloride 75 mL/hr at 08/23/21 1341     LOS: 1 day      Sidney Ace, MD [facility]   If 7PM-7AM, please contact night-coverage  08/23/2021, 1:53 PM

## 2021-08-23 NOTE — Evaluation (Addendum)
Clinical/Bedside Swallow Evaluation Patient Details  Name: [name] [name] MRN: [id number] Date of Birth: 1927/06/27  Today's Date: 08/23/2021 Time: SLP Start Time (ACUTE ONLY): 0840 SLP Stop Time (ACUTE ONLY): 0940 SLP Time Calculation (min) (ACUTE ONLY): 60 min  Past Medical History:  Past Medical History:  Diagnosis Date   Anemia    Atrial tachycardia ([place])    Cancer ([place])    colon   [place], HX OF 09/07/2008   Qualifier: Diagnosis of  By: Asa Lente MD, Mate[place] Flow A    Cough    yellow to clear sputum cough for a while per [name] no fever   HCAP (healthcare-associated pneumonia) 10/31/2015   History of blood transfusion last transfusion 09-29-17   HTN (hypertension)    Jaundice due to hepatitis    at age 56 or 44   Pneumonia 2014   Transient global amnesia few yrs ago x 2   AMS   Tremors of nervous system    benign familial   Past Surgical History:  Past Surgical History:  Procedure Laterality Date   ABDOMINAL HERNIA REPAIR  yrs ago   BACK SURGERY     years ago lower   LAPAROSCOPIC PARTIAL COLECTOMY N/A 11/04/2017   Procedure: LAPAROSCOPIC RIGHT HEMICOLECTOMY;  Surgeon: Excell Seltzer, MD;  Location: [facility];  Service: General;  Laterality: N/A;   TONSILLECTOMY  age 28   HPI:  [name] [name] is a 88 y.o. female with medical history significant of   Anemia associated with Iron/B12 deficiency, Atrial tachycardia/Paroxsymal,  SVT/hx of bradycardia,Allergy with chronic cough, colon ca s/p hemicolectomy followed by oncology, HTN, tremors, who present to ED with generalized weakness and fever/chills/ flushed appearance. All symptoms that started this am per daughter. Patients denies n/v/d/abdominal pain or dysuria but does have hx of urinary incontinence.  A recent MBSS revealed: "[name] presents with functional oropharyngeal swallow ability without aspiration nor frank larygneal penetration of any consistency tested" w/ No f/u.    Assessment / Plan / Recommendation  Clinical Impression  [name]  seen today for BSE. [name] awake/alert x4. She followed directions and engaged in conversation w/ this SLP and Dtr. Dtr present in room during evaluation. Noted [name] having resting tremors(mild) at baseline. [name] on RA.  [name] appears to present w/ adequate oropharyngeal phase swallow function w/ No oropharyngeal phase dysphagia noted, No neuromuscular deficits noted. [name] consumed po trials w/ No overt, clinical s/s of aspiration during intake. [name] appears at reduced risk for aspiration from an oropharyngeal phase standpoing when following general aspiration precautions. Also: MBSS in 05/2021 revealed: "[name] presents with functional oropharyngeal swallow ability without aspiration nor frank larygneal penetration of any consistency tested.".  OF NOTE: [name] has a Large Hiatal Hernia per chart notes; Imaging in chart. [name]/Dtr aware. Discussion was had w/ them that ANY Esophageal phase Dysmotility or Regurgitation of Reflux material can increase risk for aspiration of that Reflux material during Retrograde backflow thus impact Pulmonary status, even aspiration pneumonia of Reflux.  During po trials, [name] consumed all consistencies w/ no overt coughing, decline in vocal quality, or change in respiratory presentation during/post trials. O2 sats remained 98%. Oral phase appeared [facility] w/ timely bolus management, mastication, and control of bolus propulsion for A-P transfer for swallowing. Oral clearing achieved w/ all trial consistencies. OM Exam appeared [facility] w/ no unilateral weakness noted. Speech Clear. [name] fed self w/ setup support.   Recommend continue a Regular consistency diet w/ well-Cut meats, moistened foods; Thin liquids VIA CUP - less straw use for less  air swallowed. Recommend general aspiration precautions including Pills WHOLE in Puree for safer, easier swallowing as [name] described Larger pills causing difficulty to swallow. Recommend STRICT REFLUX PRECAUTIONS during/post meals secondary to Esophageal phase Dysmotility  secondary to the Large Hiatal Hernia. Education given on Pills in Puree; food consistencies and easy to eat options; general aspiration precautions; AND MUCH EDUCATION given on REFLUX/ESOPHAGEAL phase DYSMOTILITY and STRATEGIES to lessen Esophageal phase Dysmotility. NSG to reconsult if any new needs arise. MD and NSG agreed SLP Visit Diagnosis: Dysphagia, unspecified (R13.10)    Aspiration Risk   (reduced following general aspiration and REFLUX precautions)    Diet Recommendation   Regular consistency diet w/ well-Cut meats, moistened foods; Thin liquids VIA CUP - less straw use for less air swallowed. Recommend general aspiration precautions including Pills WHOLE in Puree for safer, easier swallowing as [name] described Larger pills causing difficulty to swallow. Recommend STRICT REFLUX PRECAUTIONS during/post meals secondary to Esophageal phase Dysmotility secondary to the Large Hiatal Hernia.  Medication Administration: Whole meds with puree (larger pills; as desired)    Other  Recommendations Recommended Consults: Consider GI evaluation;Consider esophageal assessment (re: Large Hiatal Hernia for further education on its impact on Esophageal motility if desired) Oral Care Recommendations: Oral care BID;Oral care before and after PO;Patient independent with oral care Other Recommendations:  (n/a)    Recommendations for follow up therapy are one component of a multi-disciplinary discharge planning process, led by the attending physician.  Recommendations may be updated based on patient status, additional functional criteria and insurance authorization.  Follow up Recommendations No SLP follow up      Assistance Recommended at Discharge PRN (setup)  Functional Status Assessment Patient has not had a recent decline in their functional status (from her baseline)  Frequency and Duration  (n/a)   (n/a)       Prognosis Prognosis for Safe Diet Advancement: Fair (-Good) Barriers to Reach Goals:  Time post onset;Severity of deficits (Large Hiatal Hernia at baseline) Barriers/Prognosis Comment: Large Hiatal Hernia      Swallow Study   General Date of Onset: 08/22/21 HPI: [name] [name] is a 88 y.o. female with medical history significant of   Anemia associated with Iron/B12 deficiency, Atrial tachycardia/Paroxsymal,  SVT/hx of bradycardia,Allergy with chronic cough, colon ca s/p hemicolectomy followed by oncology, HTN, tremors, who present to ED with generalized weakness and fever/chills/ flushed appearance. All symptoms that started this am per daughter. Patients denies n/v/d/abdominal pain or dysuria but does have hx of urinary incontinence.  A recent MBSS revealed: "[name] presents with functional oropharyngeal swallow ability without aspiration nor frank larygneal penetration of any consistency tested" w/ No f/u. Type of Study: Bedside Swallow Evaluation Previous Swallow Assessment: MBSS 05/2021 -- functional swallow Diet Prior to this Study: Regular;Thin liquids Temperature Spikes Noted: No Respiratory Status: Room air History of Recent Intubation: No Behavior/Cognition: Alert;Cooperative;Pleasant mood Oral Cavity Assessment: Within Functional Limits Oral Care Completed by SLP: Recent completion by staff Oral Cavity - Dentition: Adequate natural dentition Vision: Functional for self-feeding Self-Feeding Abilities: Able to feed self;Needs set up Patient Positioning: Upright in bed (supportive positioning given) Baseline Vocal Quality: Normal Volitional Cough: Strong Volitional Swallow: Able to elicit    Oral/Motor/Sensory Function Overall Oral Motor/Sensory Function: Within functional limits   Ice Chips Ice chips: Not tested   Thin Liquid Thin Liquid: Within functional limits Presentation: Cup;Self Fed (15+ trials) Other Comments: prior/during meal    Nectar Thick Nectar Thick Liquid: Not tested   Honey Thick Honey Thick  Liquid: Not tested   Puree Puree: Within functional  limits Presentation: Self Fed;Spoon (~6+ ozs)   Solid     Solid: Within functional limits (soft/moist) Presentation: Self Fed (5 trials)         Orinda Kenner, MS, CCC-SLP Speech Language Pathologist Rehab Services; [place] [phone] (ascom) [name] [name] 08/23/2021,10:19 AM

## 2021-08-24 ENCOUNTER — Inpatient Hospital Stay (HOSPITAL_COMMUNITY)
Admit: 2021-08-24 | Discharge: 2021-08-24 | Disposition: A | Discharge status: Home / Self Care | Payer: MEDICARE | Attending: Cardiology | Admitting: Cardiology

## 2021-08-24 DIAGNOSIS — I4891 Unspecified atrial fibrillation: Secondary | ICD-10-CM | POA: Diagnosis not present

## 2021-08-24 DIAGNOSIS — I10 Essential (primary) hypertension: Secondary | ICD-10-CM | POA: Diagnosis not present

## 2021-08-24 DIAGNOSIS — R9431 Abnormal electrocardiogram [ECG] [EKG]: Secondary | ICD-10-CM

## 2021-08-24 DIAGNOSIS — I480 Paroxysmal atrial fibrillation: Secondary | ICD-10-CM | POA: Diagnosis not present

## 2021-08-24 LAB — BASIC METABOLIC PANEL
Anion gap: 7 (ref 5–15)
BUN: 8 mg/dL (ref 8–23)
CO2: 22 mmol/L (ref 22–32)
Calcium: 8.8 mg/dL — ABNORMAL LOW (ref 8.9–10.3)
Chloride: 107 mmol/L (ref 98–111)
Creatinine, Ser: 0.64 mg/dL (ref 0.44–1.00)
GFR, Estimated: >60 mL/min (ref >60)
Glucose, Bld: 117 mg/dL — ABNORMAL HIGH (ref 70–99)
Potassium: 3.7 mmol/L (ref 3.5–5.1)
Sodium: 136 mmol/L (ref 135–145)

## 2021-08-24 LAB — CBC WITH DIFFERENTIAL/PLATELET
Abs Immature Granulocytes: 0.02 10*3/uL (ref 0.00–0.07)
Basophils Absolute: 0 10*3/uL (ref 0.0–0.1)
Basophils Relative: 0 %
Eosinophils Absolute: 0.1 10*3/uL (ref 0.0–0.5)
Eosinophils Relative: 1 %
HCT: 48.9 % — ABNORMAL HIGH (ref 36.0–46.0)
Hemoglobin: 16.2 g/dL — ABNORMAL HIGH (ref 12.0–15.0)
Immature Granulocytes: 0 %
Lymphocytes Relative: 13 %
Lymphs Abs: 1 10*3/uL (ref 0.7–4.0)
MCH: 28.8 pg (ref 26.0–34.0)
MCHC: 33.1 g/dL (ref 30.0–36.0)
MCV: 86.9 fL (ref 80.0–100.0)
Monocytes Absolute: 0.9 10*3/uL (ref 0.1–1.0)
Monocytes Relative: 12 %
Neutro Abs: 5.5 10*3/uL (ref 1.7–7.7)
Neutrophils Relative %: 74 %
Platelets: 288 10*3/uL (ref 150–400)
RBC: 5.63 MIL/uL — ABNORMAL HIGH (ref 3.87–5.11)
RDW: 13.8 % (ref 11.5–15.5)
WBC: 7.6 10*3/uL (ref 4.0–10.5)
nRBC: 0 % (ref 0.0–0.2)

## 2021-08-24 LAB — ECHOCARDIOGRAM COMPLETE
AR max vel: 1.26 cm2
AV Peak grad: 6.8 mmHg
Ao pk vel: 1.3 m/s
Area-P 1/2: 4.12 cm2
Calc EF: 57 %
S' Lateral: 3.43 cm
Single Plane A2C EF: 64.5 %
Single Plane A4C EF: 56.5 %

## 2021-08-24 LAB — MAGNESIUM: Magnesium: 2.2 mg/dL (ref 1.7–2.4)

## 2021-08-24 LAB — URINE CULTURE

## 2021-08-24 MED ORDER — POTASSIUM CHLORIDE CRYS ER 20 MEQ PO TBCR
20.0000 meq | EXTENDED_RELEASE_TABLET | Freq: Once | ORAL | Status: AC
  Administered 2021-08-24: 20 meq via ORAL
  Filled 2021-08-24: qty 1

## 2021-08-24 MED ORDER — BENZONATATE 100 MG PO CAPS
200.0000 mg | ORAL_CAPSULE | Freq: Three times a day (TID) | ORAL | Status: DC
  Administered 2021-08-24 – 2021-08-25 (×5): 200 mg via ORAL
  Filled 2021-08-24 (×6): qty 2

## 2021-08-24 MED ORDER — GUAIFENESIN-DM 100-10 MG/5ML PO SYRP
5.0000 mL | ORAL_SOLUTION | ORAL | Status: DC | PRN
  Filled 2021-08-24: qty 10

## 2021-08-24 MED ORDER — PERFLUTREN LIPID MICROSPHERE
1.0000 mL | INTRAVENOUS | Status: AC | PRN
  Administered 2021-08-24: 2.5 mL via INTRAVENOUS

## 2021-08-24 NOTE — Progress Notes (Signed)
*  PRELIMINARY RESULTS* Echocardiogram 2D Echocardiogram has been performed. Definity IV ultrasound imaging agent used on this study.  Claretta Fraise 08/24/2021, 1:34 PM
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
## 2021-08-24 NOTE — Evaluation (Signed)
Occupational Therapy Evaluation Patient Details Name: [name] [name] MRN: [id number] DOB: 09/25/1927 Today's Date: 08/24/2021   History of Present Illness Pt is a 88 year old female presenting to ED with generalized weakness and fever/chills/ flushed appearance,  new onset afib. PMH significant foranemia, atrial tachycardia, colon cancer, hypertension, tremors, transient global amnesia, chronic allergies with chronic cough from postnasal drip   Clinical Impression   Chart reviewed, RN cleared pt for participation in OT evaluation. Pt daughter who reports she is primary caregiver and pt lives with is present throughout evaluation. Pt is alert and oriented x4. PTA pt amb household distances with RW with SBA from family. Assist required for dressing and bathing tasks. At this time pt presents with mild deficits in strength, endurance, and activity tolerance as compared to previous baseline. She would benefit from [facility] to address deficits and to facilitate return to PLOF. Pt is left as received, NAD, all needs met. OT will follow acutely.      Recommendations for follow up therapy are one component of a multi-disciplinary discharge planning process, led by the attending physician.  Recommendations may be updated based on patient status, additional functional criteria and insurance authorization.   Follow Up Recommendations  Home health OT    Assistance Recommended at Discharge Frequent or constant Supervision/Assistance  Patient can return home with the following A little help with walking and/or transfers;A little help with bathing/dressing/bathroom    Functional Status Assessment  Patient has had a recent decline in their functional status and demonstrates the ability to make significant improvements in function in a reasonable and predictable amount of time.  Equipment Recommendations  None recommended by OT    Recommendations for Other Services       Precautions / Restrictions  Precautions Precautions: Fall Restrictions Weight Bearing Restrictions: No      Mobility Bed Mobility Overal bed mobility: Needs Assistance Bed Mobility: Supine to Sit, Sit to Supine     Supine to sit: HOB elevated, Min assist Sit to supine: Supervision, HOB elevated   General bed mobility comments: has bed rails at home    Transfers Overall transfer level: Needs assistance Equipment used: Rolling walker (2 wheels) Transfers: Sit to/from Stand Sit to Stand: Min guard                  Balance Overall balance assessment: Needs assistance Sitting-balance support: Feet supported Sitting balance-Leahy Scale: Good     Standing balance support: During functional activity, Bilateral upper extremity supported, Reliant on assistive device for balance Standing balance-Leahy Scale: Fair                             ADL either performed or assessed with clinical judgement   ADL Overall ADL's : Needs assistance/impaired     Grooming: Wash/dry hands;Sitting;Set up Grooming Details (indicate cue type and reason): anticipated             Lower Body Dressing: Moderate assistance;Sit to/from stand   Toilet Transfer: BSC/3in1;Ambulation;Min guard   Toileting- Clothing Manipulation and Hygiene: Maximal assistance;Sit to/from stand Toileting - Clothing Manipulation Details (indicate cue type and reason): from daugther pt reports this is baseline     Functional mobility during ADLs: Min guard;Supervision/safety;Rolling walker (2 wheels) (household distances within room)       Vision Patient Visual Report: No change from baseline       Perception     Praxis  Pertinent Vitals/Pain Pain Assessment Pain Assessment: No/denies pain     Hand Dominance     Extremity/Trunk Assessment Upper Extremity Assessment Upper Extremity Assessment: Generalized weakness   Lower Extremity Assessment Lower Extremity Assessment: Generalized weakness   Cervical  / Trunk Assessment Cervical / Trunk Assessment: Kyphotic   Communication Communication Communication: No difficulties   Cognition Arousal/Alertness: Awake/alert Behavior During Therapy: WFL for tasks assessed/performed Overall Cognitive Status: Within Functional Limits for tasks assessed                                       General Comments  vss throughout    Exercises Other Exercises Other Exercises: edu pt and daugther re: role of OT, role of rehab, discharge recommendations, home safety, falls prevention   Shoulder Instructions      Home Living Family/patient expects to be discharged to:: Private residence Living Arrangements: Children Available Help at Discharge: Family;Available 24 hours/day Type of Home: House Home Access: Ramped entrance     Home Layout: One level     Bathroom Shower/Tub: Occupational psychologist: Standard Bathroom Accessibility: Yes   Home Equipment: International aid/development worker (2 wheels)          Prior Functioning/Environment Prior Level of Function : Needs assist             Mobility Comments: pt amb household and short community distances with RW, family reports they typically provide SBA ADLs Comments: assit from daugther for LB dressing, toileting (due to pt uterine prolapse per pt daugther report), bathing; Assist for IADLs provided by family.        OT Problem List: Impaired balance (sitting and/or standing);Decreased activity tolerance;Decreased strength      OT Treatment/Interventions: Self-care/ADL training;Patient/family education;Therapeutic exercise;Energy conservation;Balance training;Therapeutic activities;DME and/or AE instruction    OT Goals(Current goals can be found in the care plan section) Acute Rehab OT Goals Patient Stated Goal: go home OT Goal Formulation: With patient Time For Goal Achievement: 09/07/21 Potential to Achieve Goals: Good ADL Goals Pt Will Perform  Grooming: standing;with supervision Pt Will Perform Lower Body Dressing: with supervision;sit to/from stand Pt Will Transfer to Toilet: with supervision;ambulating Pt Will Perform Toileting - Clothing Manipulation and hygiene: with supervision;sit to/from stand  OT Frequency: Min 2X/week    Co-evaluation              AM-PAC OT "6 Clicks" Daily Activity     Outcome Measure Help from another person eating meals?: None Help from another person taking care of personal grooming?: None Help from another person toileting, which includes using toliet, bedpan, or urinal?: A Lot Help from another person bathing (including washing, rinsing, drying)?: A Lot Help from another person to put on and taking off regular upper body clothing?: A Little Help from another person to put on and taking off regular lower body clothing?: A Lot 6 Click Score: 17   End of Session Equipment Utilized During Treatment: Rolling walker (2 wheels) Nurse Communication: Mobility status  Activity Tolerance: Patient tolerated treatment well Patient left: in bed;with call bell/phone within reach  OT Visit Diagnosis: Unsteadiness on feet (R26.81);Muscle weakness (generalized) (M62.81)                Time: 1544-1600 OT Time Calculation (min): 16 min Charges:  OT General Charges $OT Visit: 1 Visit OT Evaluation $OT Eval Low Complexity: 1 Low  Shanon Payor, OTD OTR/L  08/24/21, 4:11 PM

## 2021-08-24 NOTE — Progress Notes (Signed)
Patient's daughter, Marcie Bal, called multiple times this shift.  Marcie Bal does not have patient password and was transferred into patient's room multiple times to speak to patient.  Marcie Bal called for a final time and states that her calls into patient's room have not been answered and requested that nurse staff go into the room and ask patient to answer the phone.  I went into patient's room and fond patient's phone unplugged from the wall.  Patient states that she does not want to talk on the phone at this time because when she talks, she starts coughing.  This information was relayed to Marcie Bal who got upset and states that her sister is coaching patient.  I state to her that she is welcome to visit patient and informed her of [facility] visiting hours.

## 2021-08-24 NOTE — Progress Notes (Signed)
Progress Note  Patient Name: [name] [name] Date of Encounter: 08/24/2021  [facility] Cardiologist: Minus Breeding, MD   Subjective   Patient seen on a.m. rounds.  She is sitting on the side of the bed eating breakfast.  Her daughter remains at the bedside.  She denies any shortness of breath, chest pain, or palpitations.  Inpatient Medications    Scheduled Meds:  amLODipine  10 mg Oral Daily   cloNIDine  0.2 mg Oral BID   heparin  5,000 Units Subcutaneous Q8H   Continuous Infusions:  PRN Meds:    Vital Signs    Vitals:   08/23/21 1935 08/24/21 0000 08/24/21 0456 08/24/21 0925  BP: (!) 144/85 136/82 (!) 151/92 (!) 154/86  Pulse: 83 80 88 77  Resp: '20 18 18 18  '$ Temp: 98.7 F (37.1 C) 98.4 F (36.9 C) 98.9 F (37.2 C) 98 F (36.7 C)  TempSrc: Oral     SpO2: 95% 95% 97% 98%   No intake or output data in the 24 hours ending 08/24/21 1029    05/27/2021    2:38 PM 10/05/2020    1:04 PM 09/03/2020    1:19 PM  Last 3 Weights  Weight (lbs) 195 lb 1.6 oz 191 lb 9.6 oz 186 lb 14.4 oz  Weight (kg) 88.497 kg 86.909 kg 84.777 kg      Telemetry    Rate controlled atrial fibrillation rate 70s to 80s- Personally Reviewed  ECG    No new tracings- Personally Reviewed  Physical Exam   GEN: No acute distress.  Sitting upright on the side of the bed eating breakfast Neck: No JVD Cardiac: Irregularly irregular, II/VI murmur, without rubs, or gallops.  Respiratory: Clear to auscultation bilaterally.  Respirations are unlabored at rest on room air. GI: Soft, nontender, non-distended, obese with bowel sounds present in all 4 quadrants. MS: 1+ edema to the bilateral lower extremities; No deformity. Neuro:  Nonfocal  Psych: Normal affect   Labs    High Sensitivity Troponin:   Recent Labs  Lab 08/22/21 1802  TROPONINIHS 6     Chemistry Recent Labs  Lab 08/22/21 1802 08/23/21 0349 08/24/21 0859  NA 135 136 136  K 4.2 3.3* 3.7  CL 104 108 107  CO2 22 20* 22   GLUCOSE 140* 126* 117*  BUN '13 9 8  '$ CREATININE 0.73 0.66 0.64  CALCIUM 8.6* 8.2* 8.8*  MG  --   --  2.2  PROT 7.4 6.9  --   ALBUMIN 3.9 3.4*  --   AST 29 24  --   ALT 17 18  --   ALKPHOS 117 101  --   BILITOT 1.5* 1.0  --   GFRNONAA >60 >60 >60  ANIONGAP '9 8 7    '$ Lipids No results for input(s): "CHOL", "TRIG", "HDL", "LABVLDL", "LDLCALC", "CHOLHDL" in the last 168 hours.  Hematology Recent Labs  Lab 08/22/21 1802 08/23/21 0349 08/24/21 0859  WBC 16.6* 11.8* 7.6  RBC 5.53* 5.12* 5.63*  HGB 15.9* 14.6 16.2*  HCT 48.7* 45.8 48.9*  MCV 88.1 89.5 86.9  MCH 28.8 28.5 28.8  MCHC 32.6 31.9 33.1  RDW 13.5 13.6 13.8  PLT 307 220 288   Thyroid  Recent Labs  Lab 08/23/21 0348  TSH 3.607    BNPNo results for input(s): "BNP", "PROBNP" in the last 168 hours.  DDimer No results for input(s): "DDIMER" in the last 168 hours.   Radiology    CT Chest Wo Contrast  Result Date: 08/22/2021 CLINICAL DATA:  Abnormal xray - lung opacity/opacities EXAM: CT CHEST WITHOUT CONTRAST TECHNIQUE: Multidetector CT imaging of the chest was performed following the standard protocol without IV contrast. RADIATION DOSE REDUCTION: This exam was performed according to the departmental dose-optimization program which includes automated exposure control, adjustment of the mA and/or kV according to patient size and/or use of iterative reconstruction technique. COMPARISON:  06/28/2017 FINDINGS: Cardiovascular: Mild coronary artery calcification. Global cardiac size is mildly enlarged, stable since prior examination. No pericardial effusion. Central pulmonary arteries are markedly enlarged in keeping with changes of pulmonary arterial hypertension. Since the prior examination, there has been progressive dilation of the pulmonary trunk, now measuring 4.1 cm in diameter (previously measuring 3.6 cm when measured in similar fashion). Moderate atherosclerotic calcification of the a thoracic aorta. The descending  thoracic aorta is mildly dilated measuring 3.2 cm in diameter within the arch beyond the takeoff of the left subclavian artery. This appears mildly progressive since prior examination where this measured 3.0 cm. Mediastinum/Nodes: Visualized thyroid is unremarkable. No pathologic thoracic adenopathy. Small fluid within the distal esophagus may relate to gastroesophageal reflux or esophageal dysmotility. Large hiatal hernia again noted which appears similar size to prior examination and accounts for the infrahilar opacity noted on recent chest radiograph. Lungs/Pleura: Stable 3 mm pulmonary nodule within the right upper lobe, safely considered benign given its stability over time. Mild bibasilar atelectasis. Lungs are otherwise clear. No pneumothorax or pleural effusion. Central airways are widely patent. Upper Abdomen: Cortical lesion within the interpolar region of the right kidney is incompletely included on this examination and not well assessed, but was previously characterized as a simple cyst on CT of 12/25/2017. No acute abnormality. Musculoskeletal: No acute bone abnormality. Osseous structures are age-appropriate. No lytic or blastic bone lesion. IMPRESSION: 1. Large hiatal hernia, similar in size to prior examination and accounting for the infrahilar opacity noted on recent chest radiograph. 2. Progressive dilation of the pulmonary trunk, now measuring 4.1 cm in diameter, in keeping with changes of progressive pulmonary arterial hypertension. 3. Mild coronary artery calcification. 4. Mild descending thoracic aortic aneurysm measuring 3.2 cm in diameter, mildly progressive since prior examination where this measured 3.0 cm. Recommend annual imaging followup by CTA or MRA. This recommendation follows 2010 ACCF/AHA/AATS/ACR/ASA/SCA/SCAI/SIR/STS/SVM Guidelines for the Diagnosis and Management of Patients with Thoracic Aortic Disease. Circulation.2010; 121: N629-B284. Aortic aneurysm NOS (ICD10-I71.9) 5. Stable  3 mm right upper lobe pulmonary nodule, safely considered benign given its stability over time. Aortic Atherosclerosis (ICD10-I70.0). Electronically Signed   By: Fidela Salisbury M.D.   On: 08/22/2021 21:59   DG Chest Port 1 View  Result Date: 08/22/2021 CLINICAL DATA:  Questionable sepsis EXAM: PORTABLE CHEST 1 VIEW COMPARISON:  10/31/2015 FINDINGS: Cardiomegaly, vascular congestion. Focal infrahilar opacity noted on the right concerning for pneumonia. No confluent opacity on the left. No effusions or acute bony abnormality. IMPRESSION: Cardiomegaly, vascular congestion. Right infrahilar opacity concerning for pneumonia. Electronically Signed   By: Rolm Baptise M.D.   On: 08/22/2021 18:22    Cardiac Studies   Echocardiogram completed 07/27/2017 Left ventricle: The cavity size was normal. Wall thickness was    normal. Systolic function was normal. The estimated ejection    fraction was in the range of 60% to 65%. Wall motion was normal;    there were no regional wall motion abnormalities. Doppler    parameters are consistent with high ventricular filling pressure.  - Aortic valve: Valve area (VTI): 2.37 cm^2. Valve area (Vmax):  2.59 cm^2. Valve area (Vmean): 2.35 cm^2.  - Mitral valve: Calcified annulus.  - Left atrium: The atrium was moderately dilated.  - Right ventricle: The cavity size was mildly dilated.  - Right atrium: The atrium was moderately dilated.  - Atrial septum: There was an atrial septal aneurysm.  - Pulmonary arteries: Systolic pressure was moderately increased.    PA peak pressure: 46 mm Hg (S).   Patient Profile     88 y.o. female with a past medical history of anemia, atrial tachycardia, colon cancer, hypertension, tremors, transient global amnesia, chronic allergies with chronic cough from postnasal drip is being seen and evaluated for new onset atrial fibrillation.  Assessment & Plan    Atrial tachycardia/PSVT/bradycardia/new onset rate controlled atrial  fibrillation -Patient has known history of atrial tachycardia but atrial fibrillation is new finding on this admission -Echocardiogram ordered and pending -Atrial fibrillation remains rate controlled and patient is asymptomatic to irregularly irregular rate and rhythm -She is currently not on beta-blocker therapy due to history of symptomatic bradycardia -CHA2DS2-VASc score of 4 patient and daughter have decided against anticoagulant therapy both are aware of the risk of stroke, the patient also has uterine prolapse and hemorrhoids and bleeds daily and is not interested in anticoagulants -Beta-blocker therapy not restarted at this time as her rate is well controlled, but if in the near future she needs rate controlling medications her amlodipine can be changed to low-dose diltiazem   Hypertension -Blood pressure this morning 154/86 -Continue clonidine and amlodipine -Vital signs per unit protocol  History of colon cancer -Patient is continue to be followed by oncology  Microcytic anemia/iron deficiency/B12 deficiency -Hemoglobin this morning 16.2 -Continues to be followed by heme-onc outpatient     For questions or updates, please contact [facility] Please consult [email or web address] for contact info under        Signed, Jayce Kainz, NP  08/24/2021, 10:29 AM

## 2021-08-24 NOTE — Progress Notes (Signed)
PROGRESS NOTE    [name] [name]  WSF:[id number] DOB: [birth year] DOA: 08/22/2021 PCP: Lawerance Cruel, MD    Brief Narrative:  88 y.o. female with medical history significant of  Anemia associated with Iron/B12 deficiency, Atrial tachycardia/Paroxsymal SVT/hx of bradycardia,Allergy with chronic cough,  colon ca s/p hemicolectomy followed by oncology , HTN,tremors , who present to ED with generalized weakness and fever/chills/ flushed appearance. All symptoms that started this am per daughter. Patients denies n/v/d/abdominal pain or dysuria but does have hx of urinary incontinence. IN the field patient BP 152/70, Temp 99.4    Assessment & Plan:   Principal Problem:   CAP (community acquired pneumonia) Active Problems:   Primary hypertension  Generalized weakness Subjective fevers/chills Symptoms started the morning of admission.  Initially there was concern for pneumonia/infectious process.  This was ruled out.  No pneumonia seen on chest CT.  Negative procalcitonin.  No fever since admission.  I do not believe this is infectious in nature and believe her symptoms are more rate likely related to underlying arrhythmia.  SLP evaluated patient.  No signs or symptoms of aspiration Plan: No abx Monitor vitals and fever curve Follow blood cultures, NGTD  Atrial tachyarrhythmia History of PSVT History of symptomatic bradycardia New onset atrial fibrillation with controlled rate Patient's primary cardiologist is Dr. Percival Spanish.  Patient with a known history of atrial tachycardia but not atrial fibrillation.  Currently rate controlled.  No chest pain or palpitations endorsed. Plan: Seen by cardiology.  At this point no plans for rate control as patient is auto rate controlled.  She is a good candidate for anticoagulation CHA2DS2-VASc is 4 however patient is very reluctant to start anticoagulation at this time.  Patient with history of bleeding hemorrhoids and uterine prolapse.  Defer AC at  this time.  Can be revisited at outpatient cardiology visit.  Essential hypertension PTA clonidine and amlodipine  History of colon cancer Outpatient follow-up with oncology  Microcytic anemia Iron deficiency anemia B12 deficiency anemia Hemoglobin stable today No indication for transfusion   DVT prophylaxis: SQ heparin Code Status: Full Family Communication: Daughter at bedside 7/14, 7/15 Disposition Plan: Status is: Inpatient Remains inpatient appropriate because: Unsafe DC plan.  Possible dc in 24 hours   Level of care: Telemetry Medical  Consultants:  Cardiology  Procedures:  None  Antimicrobials: None   Subjective: Seen and examined.  Resting comfortably in bed.  No visible distress.  No pain complaints.  Daughter at bedside.  Objective: Vitals:   08/24/21 0000 08/24/21 0456 08/24/21 0925 08/24/21 1642  BP: 136/82 (!) 151/92 (!) 154/86 139/78  Pulse: 80 88 77 80  Resp: '18 18 18 18  '$ Temp: 98.4 F (36.9 C) 98.9 F (37.2 C) 98 F (36.7 C) 98.4 F (36.9 C)  TempSrc:      SpO2: 95% 97% 98% 97%   No intake or output data in the 24 hours ending 08/24/21 1702  There were no vitals filed for this visit.  Examination:  General exam: NAD.  Resting tremor Respiratory system: Clear to auscultation. Respiratory effort normal. Cardiovascular system: S1-S2, regular rate, irregular rhythm, no murmurs, trace pedal edema Gastrointestinal system: Soft, NT/ND, normal bowel sounds Central nervous system: Alert and oriented. No focal neurological deficits. Extremities: Symmetric 5 x 5 power. Skin: No rashes, lesions or ulcers Psychiatry: Judgement and insight appear normal. Mood & affect appropriate.     Data Reviewed: I have personally reviewed following labs and imaging studies  CBC: Recent Labs  Lab 08/22/21  1802 08/23/21 0349 08/24/21 0859  WBC 16.6* 11.8* 7.6  NEUTROABS 15.2* 10.4* 5.5  HGB 15.9* 14.6 16.2*  HCT 48.7* 45.8 48.9*  MCV 88.1 89.5 86.9   PLT 307 220 941   Basic Metabolic Panel: Recent Labs  Lab 08/22/21 1802 08/23/21 0349 08/24/21 0859  NA 135 136 136  K 4.2 3.3* 3.7  CL 104 108 107  CO2 22 20* 22  GLUCOSE 140* 126* 117*  BUN '13 9 8  '$ CREATININE 0.73 0.66 0.64  CALCIUM 8.6* 8.2* 8.8*  MG  --   --  2.2   GFR: CrCl cannot be calculated (Unknown ideal weight.). Liver Function Tests: Recent Labs  Lab 08/22/21 1802 08/23/21 0349  AST 29 24  ALT 17 18  ALKPHOS 117 101  BILITOT 1.5* 1.0  PROT 7.4 6.9  ALBUMIN 3.9 3.4*   No results for input(s): "LIPASE", "AMYLASE" in the last 168 hours. No results for input(s): "AMMONIA" in the last 168 hours. Coagulation Profile: No results for input(s): "INR", "PROTIME" in the last 168 hours. Cardiac Enzymes: No results for input(s): "CKTOTAL", "CKMB", "CKMBINDEX", "TROPONINI" in the last 168 hours. BNP (last 3 results) No results for input(s): "PROBNP" in the last 8760 hours. HbA1C: No results for input(s): "HGBA1C" in the last 72 hours. CBG: No results for input(s): "GLUCAP" in the last 168 hours. Lipid Profile: No results for input(s): "CHOL", "HDL", "LDLCALC", "TRIG", "CHOLHDL", "LDLDIRECT" in the last 72 hours. Thyroid Function Tests: Recent Labs    08/23/21 0348  TSH 3.607   Anemia Panel: No results for input(s): "VITAMINB12", "FOLATE", "FERRITIN", "TIBC", "IRON", "RETICCTPCT" in the last 72 hours. Sepsis Labs: Recent Labs  Lab 08/22/21 1751 08/23/21 0301 08/23/21 0348 08/23/21 0641  PROCALCITON  --   --  0.17  --   LATICACIDVEN 2.0* 1.7  --  1.5    Recent Results (from the past 240 hour(s))  Blood Culture (routine x 2)     Status: None (Preliminary result)   Collection Time: 08/22/21  6:02 PM   Specimen: BLOOD LEFT FOREARM  Result Value Ref Range Status   Specimen Description BLOOD LEFT FOREARM  Final   Special Requests   Final    BOTTLES DRAWN AEROBIC AND ANAEROBIC Blood Culture results may not be optimal due to an inadequate volume of blood  received in culture bottles   Culture   Final    NO GROWTH 2 DAYS Performed at [facility], [address] [place], [place] [address]    Report Status PENDING  Incomplete  SARS Coronavirus 2 by RT PCR (hospital order, performed in [place] hospital lab) *cepheid single result test* Anterior Nasal Swab     Status: None   Collection Time: 08/22/21  8:23 PM   Specimen: Anterior Nasal Swab  Result Value Ref Range Status   SARS Coronavirus 2 by RT PCR NEGATIVE NEGATIVE Final    Comment: (NOTE) SARS-CoV-2 target nucleic acids are NOT DETECTED.  The SARS-CoV-2 RNA is generally detectable in upper and lower respiratory specimens during the acute phase of infection. The lowest concentration of SARS-CoV-2 viral copies this assay can detect is 250 copies / mL. A negative result does not preclude SARS-CoV-2 infection and should not be used as the sole basis for treatment or other patient management decisions.  A negative result may occur with improper specimen collection / handling, submission of specimen other than nasopharyngeal swab, presence of viral mutation(s) within the areas targeted by this assay, and inadequate number of viral  copies (<250 copies / mL). A negative result must be combined with clinical observations, patient history, and epidemiological information.  Fact Sheet for Patients:   https://www.patel.info/  Fact Sheet for Healthcare Providers: https://hall.com/  This test is not yet approved or  cleared by the [place] FDA and has been authorized for detection and/or diagnosis of SARS-CoV-2 by FDA under an Emergency Use Authorization (EUA).  This EUA will remain in effect (meaning this test can be used) for the duration of the COVID-19 declaration under Section 564(b)(1) of the Act, 21 U.S.C. section 360bbb-3(b)(1), unless the authorization is terminated or revoked sooner.  Performed at [facility],  [address] [place], [place] [address]   Urine Culture     Status: Abnormal   Collection Time: 08/22/21 11:37 PM   Specimen: Urine, Random  Result Value Ref Range Status   Specimen Description   Final    URINE, RANDOM Performed at [facility], [address] [place], [place] [address]    Special Requests   Final    NONE Performed at [facility], [address] [place], [place] [address]    Culture MULTIPLE SPECIES PRESENT, SUGGEST RECOLLECTION (A)  Final   Report Status 08/24/2021 FINAL  Final  Respiratory (~20 pathogens) panel by PCR     Status: None   Collection Time: 08/23/21  2:00 PM   Specimen: Nasopharyngeal Swab; Respiratory  Result Value Ref Range Status   Adenovirus NOT DETECTED NOT DETECTED Final   Coronavirus 229E NOT DETECTED NOT DETECTED Final    Comment: (NOTE) The Coronavirus on the Respiratory Panel, DOES NOT test for the novel  Coronavirus (2019 nCoV)    Coronavirus HKU1 NOT DETECTED NOT DETECTED Final   Coronavirus NL63 NOT DETECTED NOT DETECTED Final   Coronavirus OC43 NOT DETECTED NOT DETECTED Final   Metapneumovirus NOT DETECTED NOT DETECTED Final   Rhinovirus / Enterovirus NOT DETECTED NOT DETECTED Final   Influenza A NOT DETECTED NOT DETECTED Final   Influenza B NOT DETECTED NOT DETECTED Final   Parainfluenza Virus 1 NOT DETECTED NOT DETECTED Final   Parainfluenza Virus 2 NOT DETECTED NOT DETECTED Final   Parainfluenza Virus 3 NOT DETECTED NOT DETECTED Final   Parainfluenza Virus 4 NOT DETECTED NOT DETECTED Final   Respiratory Syncytial Virus NOT DETECTED NOT DETECTED Final   Bordetella pertussis NOT DETECTED NOT DETECTED Final   Bordetella Parapertussis NOT DETECTED NOT DETECTED Final   Chlamydophila pneumoniae NOT DETECTED NOT DETECTED Final   Mycoplasma pneumoniae NOT DETECTED NOT DETECTED Final    Comment: Performed at [facility], [place] [address], [place],  [address]         Radiology  Studies: ECHOCARDIOGRAM COMPLETE  Result Date: 08/24/2021    ECHOCARDIOGRAM REPORT   Patient Name:   [name] [name] Date of Exam: 08/24/2021 Medical Rec #:  [id number]      Height:       63.0 in Accession #:    [id number]     Weight:       195.1 lb Date of Birth:  March 29, 1927     BSA:          1.913 m Patient Age:    43 years       BP:           134/71 mmHg Patient Gender: F              HR:  85 bpm. Exam Location:  [facility] Procedure: 2D Echo and Intracardiac Opacification Agent Indications:     Abnormal ECG R94.31  History:         Patient has prior history of Echocardiogram examinations, most                  recent 07/27/2017.  Sonographer:     Kathlen Brunswick RDCS Referring Phys:  [id number] SHERI HAMMOCK Diagnosing Phys: Kate Sable MD  Sonographer Comments: Technically difficult study due to poor echo windows, suboptimal apical window and no subcostal window. Image acquisition challenging due to patient body habitus and Image acquisition challenging due to respiratory motion. IMPRESSIONS  1. Left ventricular ejection fraction, by estimation, is 55 to 60%. Left ventricular ejection fraction by 2D MOD biplane is 57.0 %. The left ventricle has normal function. The left ventricle has no regional wall motion abnormalities. Left ventricular diastolic parameters are indeterminate.  2. Right ventricular systolic function is normal. The right ventricular size is not well visualized.  3. Left atrial size was severely dilated.  4. Right atrial size was moderately dilated.  5. The mitral valve is normal in structure. Mild mitral valve regurgitation.  6. The aortic valve is tricuspid. Aortic valve regurgitation is not visualized. Aortic valve sclerosis is present, with no evidence of aortic valve stenosis. FINDINGS  Left Ventricle: Left ventricular ejection fraction, by estimation, is 55 to 60%. Left ventricular ejection fraction by 2D MOD biplane is 57.0 %. The left ventricle has normal function. The left  ventricle has no regional wall motion abnormalities. Definity contrast agent was given IV to delineate the left ventricular endocardial borders. The left ventricular internal cavity size was normal in size. There is no left ventricular hypertrophy. Left ventricular diastolic parameters are indeterminate. Right Ventricle: The right ventricular size is not well visualized. No increase in right ventricular wall thickness. Right ventricular systolic function is normal. Left Atrium: Left atrial size was severely dilated. Right Atrium: Right atrial size was moderately dilated. Pericardium: There is no evidence of pericardial effusion. Mitral Valve: The mitral valve is normal in structure. Mild mitral valve regurgitation. Tricuspid Valve: The tricuspid valve is normal in structure. Tricuspid valve regurgitation is mild. Aortic Valve: The aortic valve is tricuspid. Aortic valve regurgitation is not visualized. Aortic valve sclerosis is present, with no evidence of aortic valve stenosis. Aortic valve peak gradient measures 6.8 mmHg. Pulmonic Valve: The pulmonic valve was normal in structure. Pulmonic valve regurgitation is mild. Aorta: The aortic root is normal in size and structure. IAS/Shunts: No atrial level shunt detected by color flow Doppler.  LEFT VENTRICLE PLAX 2D                        Biplane EF (MOD) LVIDd:         4.96 cm         LV Biplane EF:   Left LVIDs:         3.42 cm                          ventricular LV PW:         0.95 cm                          ejection LV IVS:        0.74 cm  fraction by LVOT diam:     1.80 cm                          2D MOD LV SV:         28                               biplane is LV SV Index:   15                               57.0 %. LVOT Area:     2.54 cm                                Diastology                                LV e' medial:    10.20 cm/s LV Volumes (MOD)               LV E/e' medial:  9.8 LV vol d, MOD    49.0 ml       LV e' lateral:    14.70 cm/s A2C:                           LV E/e' lateral: 6.8 LV vol d, MOD    61.6 ml A4C: LV vol s, MOD    17.4 ml A2C: LV vol s, MOD    26.8 ml A4C: LV SV MOD A2C:   31.6 ml LV SV MOD A4C:   61.6 ml LV SV MOD BP:    31.6 ml RIGHT VENTRICLE RV Basal diam:  3.72 cm TAPSE (M-mode): 2.1 cm LEFT ATRIUM            Index LA diam:      3.90 cm  2.04 cm/m LA Vol (A4C): 127.0 ml 66.38 ml/m  AORTIC VALVE                 PULMONIC VALVE AV Area (Vmax): 1.26 cm     PV Vmax:       1.36 m/s AV Vmax:        130.00 cm/s  PV Peak grad:  7.4 mmHg AV Peak Grad:   6.8 mmHg LVOT Vmax:      64.60 cm/s LVOT Vmean:     37.800 cm/s LVOT VTI:       0.111 m  AORTA Ao Root diam: 3.20 cm Ao Asc diam:  3.40 cm MITRAL VALVE                TRICUSPID VALVE MV Area (PHT): 4.12 cm     TV Peak grad:   28.5 mmHg MV Decel Time: 184 msec     TV Vmax:        2.67 m/s MV E velocity: 100.00 cm/s                             SHUNTS                             Systemic VTI:  0.11 m  Systemic Diam: 1.80 cm Kate Sable MD Electronically signed by Kate Sable MD Signature Date/Time: 08/24/2021/1:41:43 PM    Final    CT Chest Wo Contrast  Result Date: 08/22/2021 CLINICAL DATA:  Abnormal xray - lung opacity/opacities EXAM: CT CHEST WITHOUT CONTRAST TECHNIQUE: Multidetector CT imaging of the chest was performed following the standard protocol without IV contrast. RADIATION DOSE REDUCTION: This exam was performed according to the departmental dose-optimization program which includes automated exposure control, adjustment of the mA and/or kV according to patient size and/or use of iterative reconstruction technique. COMPARISON:  06/28/2017 FINDINGS: Cardiovascular: Mild coronary artery calcification. Global cardiac size is mildly enlarged, stable since prior examination. No pericardial effusion. Central pulmonary arteries are markedly enlarged in keeping with changes of pulmonary arterial hypertension. Since the prior  examination, there has been progressive dilation of the pulmonary trunk, now measuring 4.1 cm in diameter (previously measuring 3.6 cm when measured in similar fashion). Moderate atherosclerotic calcification of the a thoracic aorta. The descending thoracic aorta is mildly dilated measuring 3.2 cm in diameter within the arch beyond the takeoff of the left subclavian artery. This appears mildly progressive since prior examination where this measured 3.0 cm. Mediastinum/Nodes: Visualized thyroid is unremarkable. No pathologic thoracic adenopathy. Small fluid within the distal esophagus may relate to gastroesophageal reflux or esophageal dysmotility. Large hiatal hernia again noted which appears similar size to prior examination and accounts for the infrahilar opacity noted on recent chest radiograph. Lungs/Pleura: Stable 3 mm pulmonary nodule within the right upper lobe, safely considered benign given its stability over time. Mild bibasilar atelectasis. Lungs are otherwise clear. No pneumothorax or pleural effusion. Central airways are widely patent. Upper Abdomen: Cortical lesion within the interpolar region of the right kidney is incompletely included on this examination and not well assessed, but was previously characterized as a simple cyst on CT of 12/25/2017. No acute abnormality. Musculoskeletal: No acute bone abnormality. Osseous structures are age-appropriate. No lytic or blastic bone lesion. IMPRESSION: 1. Large hiatal hernia, similar in size to prior examination and accounting for the infrahilar opacity noted on recent chest radiograph. 2. Progressive dilation of the pulmonary trunk, now measuring 4.1 cm in diameter, in keeping with changes of progressive pulmonary arterial hypertension. 3. Mild coronary artery calcification. 4. Mild descending thoracic aortic aneurysm measuring 3.2 cm in diameter, mildly progressive since prior examination where this measured 3.0 cm. Recommend annual imaging followup by  CTA or MRA. This recommendation follows 2010 ACCF/AHA/AATS/ACR/ASA/SCA/SCAI/SIR/STS/SVM Guidelines for the Diagnosis and Management of Patients with Thoracic Aortic Disease. Circulation.2010; 121: Y706-C376. Aortic aneurysm NOS (ICD10-I71.9) 5. Stable 3 mm right upper lobe pulmonary nodule, safely considered benign given its stability over time. Aortic Atherosclerosis (ICD10-I70.0). Electronically Signed   By: Fidela Salisbury M.D.   On: 08/22/2021 21:59   DG Chest Port 1 View  Result Date: 08/22/2021 CLINICAL DATA:  Questionable sepsis EXAM: PORTABLE CHEST 1 VIEW COMPARISON:  10/31/2015 FINDINGS: Cardiomegaly, vascular congestion. Focal infrahilar opacity noted on the right concerning for pneumonia. No confluent opacity on the left. No effusions or acute bony abnormality. IMPRESSION: Cardiomegaly, vascular congestion. Right infrahilar opacity concerning for pneumonia. Electronically Signed   By: Rolm Baptise M.D.   On: 08/22/2021 18:22        Scheduled Meds:  amLODipine  10 mg Oral Daily   benzonatate  200 mg Oral TID   cloNIDine  0.2 mg Oral BID   heparin  5,000 Units Subcutaneous Q8H   Continuous Infusions:     LOS: 2 days  Sidney Ace, MD [facility]   If 7PM-7AM, please contact night-coverage  08/24/2021, 5:02 PM

## 2021-08-24 NOTE — Progress Notes (Signed)
  Chaplain On-Call received a call this morning from patient's daughter Zonia Kief, who requested that the Chaplain visit the patient.  Marcie Bal described current difficult relationships between herself and her two sisters. Marcie Bal asked for the Chaplain to visit and pray with the patient.  Chaplain met the patient this afternoon at 1700. Also present at bedside was patient's daughter Lorre Nick, with whom the patient lives.  Chaplain provided spiritual and emotional support, and offered prayer for healing and wholeness for patient and family.  Chaplain Pollyann Samples M.Div., [facility]

## 2021-08-24 NOTE — Progress Notes (Signed)
After receiving a multitude of calls (around 11) to the unit from patient's daughter Zonia Kief, patient was addressed separately from other family members and asked to verify her wishes. Patient stated she would "call Marcie Bal herself" and to tell her if she called back that she is to "quit calling." Patient states she has already communicated with Marcie Bal once today and will call her later when she is feeling better. Password verified with patient. Emotional support provided to patient.

## 2021-08-25 ENCOUNTER — Other Ambulatory Visit: Payer: Self-pay

## 2021-08-25 ENCOUNTER — Encounter: Payer: Self-pay | Admitting: Internal Medicine

## 2021-08-25 DIAGNOSIS — I480 Paroxysmal atrial fibrillation: Secondary | ICD-10-CM | POA: Diagnosis not present

## 2021-08-25 MED ORDER — BENZONATATE 200 MG PO CAPS: 200.0000 mg | ORAL_CAPSULE | Freq: Three times a day (TID) | ORAL | 0 refills | Status: DC

## 2021-08-25 NOTE — Progress Notes (Signed)
Pt seen resting comfortably in bed with daughter, [name], at bedside present and attentive to patient's needs. Patient apologizes to this RN for "her daughter Marcie Bal calling several times today." This RN assured patient that no information would be given to any callers without confirmation of password that was set by the patient at admission. Patient verbalizes understanding. Instructed pt or daughter to call for any needs or assistance, both verbalized understanding. CBWR.

## 2021-08-25 NOTE — Progress Notes (Signed)
Physical Therapy Evaluation Patient Details Name: CHARLOTTE FIDALGO MRN: [id number] DOB: December 15, 1927 Today's Date: 08/25/2021  History of Present Illness  Pt is a 88 y.o. female with PMH including a-tach/paroxysmal SVT/hx of bradycardia, allergy with chronic cough, iron/B12 deficiency, colon cancer s/p hemicolectomy, HTN, tremors. Pt presented to ED on 08/22/21 with generalized weakness and fever/chills which started in AM on 08/22/21. Pt admitted with community acquired pneumonia.   Clinical Impression  Pt is a pleasant 88 year old female who was admitted for pneumonia. Pt stated she moved in with her daughter in October 2022, but prior to that lived independently. Pt performs bed mobility from sit to supine with supervision, transfers and ambulation with RW and CGA. Pt utilizes RW and Rollator at baseline. Pt has bilat LE strength deficits with 3/5 hip flex, 4/5 knee ext/flex and ankle DF. Pt ambulated to bathroom to void and required max A for pericare (from daughter) with pt able to assist in LE dressing while standing without UE support and CGA. Pt ambulated 40 feet in room with RW and CGA without LOB, but required occasional cueing to maintain appropriate distance from RW. Pt demonstrates deficits with LE strength, balance and endurance. Would benefit from skilled PT to address above deficits and promote optimal return to PLOF. Recommend HH PT at discharge due to pt current mobility status and family support at home.     Recommendations for follow up therapy are one component of a multi-disciplinary discharge planning process, led by the attending physician.  Recommendations may be updated based on patient status, additional functional criteria and insurance authorization.  Follow Up Recommendations Home health PT      Assistance Recommended at Discharge Intermittent Supervision/Assistance  Patient can return home with the following  A little help with walking and/or transfers;A little help with  bathing/dressing/bathroom;Assistance with cooking/housework;Assist for transportation;Help with stairs or ramp for entrance    Equipment Recommendations None recommended by PT  Recommendations for Other Services       Functional Status Assessment Patient has had a recent decline in their functional status and demonstrates the ability to make significant improvements in function in a reasonable and predictable amount of time.     Precautions / Restrictions Precautions Precautions: Fall Restrictions Weight Bearing Restrictions: No      Mobility  Bed Mobility Overal bed mobility: Needs Assistance Bed Mobility: Sit to Supine       Sit to supine: Supervision, HOB elevated   General bed mobility comments: pt received sitting EOB having just eaten breakfast. Pt transferred sit to supine with supervision and increased time/effort to complete task    Transfers Overall transfer level: Needs assistance Equipment used: Rolling walker (2 wheels) Transfers: Sit to/from Stand Sit to Stand: Min guard           General transfer comment: pt requires increased time/effort    Ambulation/Gait Ambulation/Gait assistance: Min guard Gait Distance (Feet): 40 Feet Assistive device: Rolling walker (2 wheels) Gait Pattern/deviations: Step-through pattern, Wide base of support Gait velocity: decreased     General Gait Details: pt required occasional verbal cues for appropriate distance from RW (per pts daughter they also provide the cue often at home). Slow, steady gait pattern with RW and CGA  Stairs            Wheelchair Mobility    Modified Rankin (Stroke Patients Only)       Balance Overall balance assessment: Needs assistance Sitting-balance support: Feet supported Sitting balance-Leahy Scale: Good Sitting balance -  Comments: steady static sitting EOB with back unsupported and no UE support   Standing balance support: No upper extremity supported, During functional  activity, Reliant on assistive device for balance, Bilateral upper extremity supported Standing balance-Leahy Scale: Fair Standing balance comment: pt utilized RW for dynamic balance/ambulation. pt able to stand for pericare without UE support                             Pertinent Vitals/Pain Pain Assessment Pain Assessment: No/denies pain    Home Living Family/patient expects to be discharged to:: Private residence Living Arrangements: Children;Other (Comment) [name]) Available Help at Discharge: Family;Available 24 hours/day Type of Home: House Home Access: Ramped entrance       Home Layout: One level Home Equipment: International aid/development worker (2 wheels);Rollator (4 wheels)      Prior Function Prior Level of Function : Needs assist             Mobility Comments: pt amb household and short community distances with RW, family reports they typically provide SBA ADLs Comments: assit from daugther for LB dressing, toileting (due to pt uterine prolapse per pt daugther report), bathing; Assist for IADLs provided by family.     Hand Dominance   Dominant Hand: Right    Extremity/Trunk Assessment   Upper Extremity Assessment Upper Extremity Assessment: Generalized weakness    Lower Extremity Assessment Lower Extremity Assessment: Generalized weakness (Bilat hip flex 3/5, knee ext/flex 4/5, ankle DF 4/5)    Cervical / Trunk Assessment Cervical / Trunk Assessment: Kyphotic  Communication   Communication: No difficulties  Cognition Arousal/Alertness: Awake/alert Behavior During Therapy: WFL for tasks assessed/performed Overall Cognitive Status: Within Functional Limits for tasks assessed                                 General Comments: A&Ox4        General Comments      Exercises Other Exercises Other Exercises: pt ambulated to bathroom to void. pts daughter requested to assist with pericare due to having a routine at home and  pts prolapsed uterus (pt agreeable to receive assist from daughter). PT was CGA in standing while max A for pericare by daughter, but pt able to assist with LE dressing in standing.   Assessment/Plan    PT Assessment Patient needs continued PT services  PT Problem List Decreased strength;Decreased activity tolerance;Decreased balance;Decreased mobility;Decreased knowledge of use of DME;Decreased range of motion       PT Treatment Interventions DME instruction;Gait training;Functional mobility training;Therapeutic activities;Therapeutic exercise;Balance training;Patient/family education    PT Goals (Current goals can be found in the Care Plan section)  Acute Rehab PT Goals Patient Stated Goal: to go home PT Goal Formulation: With patient Time For Goal Achievement: 09/08/21 Potential to Achieve Goals: Good    Frequency Min 2X/week     Co-evaluation               AM-PAC PT "6 Clicks" Mobility  Outcome Measure Help needed turning from your back to your side while in a flat bed without using bedrails?: A Little Help needed moving from lying on your back to sitting on the side of a flat bed without using bedrails?: A Little Help needed moving to and from a bed to a chair (including a wheelchair)?: A Little Help needed standing up from a chair using your arms (e.g., wheelchair or  bedside chair)?: A Little Help needed to walk in hospital room?: A Little Help needed climbing 3-5 steps with a railing? : A Lot 6 Click Score: 17    End of Session Equipment Utilized During Treatment: Gait belt Activity Tolerance: Patient tolerated treatment well Patient left: in bed;with call bell/phone within reach;with bed alarm set Nurse Communication: Mobility status PT Visit Diagnosis: Unsteadiness on feet (R26.81);Muscle weakness (generalized) (M62.81);Other abnormalities of gait and mobility (R26.89)    Time: 1771-1657 PT Time Calculation (min) (ACUTE ONLY): 25 min   Charges:               Rella Larve, SPT  Kelwin Gibler 08/25/2021, 10:41 AM
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
## 2021-08-25 NOTE — Progress Notes (Signed)
Pt discharged home via wheelchair with family. PIV removed. Discharge packet reviewed with patient and family, verbalize understanding.
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
## 2021-08-25 NOTE — Plan of Care (Signed)

## 2021-08-25 NOTE — Discharge Summary (Signed)
Physician Discharge Summary  [name] [name] MBW:[id number] DOB: 01-Oct-1927 DOA: 08/22/2021  PCP: Lawerance Cruel, MD  Admit date: 08/22/2021 Discharge date: 08/25/2021  Admitted From: Home Disposition:  Home with home health  Recommendations for Outpatient Follow-up:  Follow up with PCP in 1-2 weeks Follow up with cardiology 2-3 weeks  Home Health:Yes PT RN  Equipment/Devices:None  Discharge Condition:Stable  CODE STATUS:DNR  Diet recommendation: Heart healthy  Brief/Interim Summary: 88 y.o. female with medical history significant of  Anemia associated with Iron/B12 deficiency, Atrial tachycardia/Paroxsymal SVT/hx of bradycardia,Allergy with chronic cough,  colon ca s/p hemicolectomy followed by oncology , HTN,tremors , who present to ED with generalized weakness and fever/chills/ flushed appearance. All symptoms that started this am per daughter. Patients denies n/v/d/abdominal pain or dysuria but does have hx of urinary incontinence. IN the field patient BP 152/70, Temp 99.4  No evidence of infection.  Initial concerns likely due to new onset afib.  Patient declined DOAC at this time, citing history of uterine prolapse and bleeding hemorrhoids.  Seen by cardiology during inpatient stay.  Will follow up with established cardiology in 2-3 weeks.    Discharge Diagnoses:  Principal Problem:   CAP (community acquired pneumonia) Active Problems:   Primary hypertension   Generalized weakness Subjective fevers/chills Symptoms started the morning of admission.  Initially there was concern for pneumonia/infectious process.  This was ruled out.  No pneumonia seen on chest CT.  Negative procalcitonin.  No fever since admission.  I do not believe this is infectious in nature and believe her symptoms are more rate likely related to underlying arrhythmia.  SLP evaluated patient.  No signs or symptoms of aspiration Plan: DC home.  No antibiotics indicated   Atrial tachyarrhythmia History  of PSVT History of symptomatic bradycardia New onset atrial fibrillation with controlled rate Patient's primary cardiologist is Dr. Percival Spanish.  Patient with a known history of atrial tachycardia but not atrial fibrillation.  Currently rate controlled.  No chest pain or palpitations endorsed. Plan: Seen by cardiology.  At this point no plans for rate control as patient is auto rate controlled.  She is a good candidate for anticoagulation CHA2DS2-VASc is 4 however patient is very reluctant to start anticoagulation at this time.  Patient with history of bleeding hemorrhoids and uterine prolapse.  Defer AC at this time.  Can be revisited at outpatient cardiology visit.   Essential hypertension PTA clonidine and amlodipine   History of colon cancer Outpatient follow-up with oncology   Microcytic anemia Iron deficiency anemia B12 deficiency anemia Hemoglobin stable today No indication for transfusion   Discharge Instructions  Discharge Instructions     Diet - low sodium heart healthy   Complete by: As directed    Increase activity slowly   Complete by: As directed       Allergies as of 08/25/2021       Reactions   Aspirin Hives   Codeine Other (See Comments)   REACTION: GI upset   Hydrochlorothiazide Rash   Penicillins Hives, Shortness Of Breath, Rash, Other (See Comments)   Has patient had a PCN reaction causing immediate rash, facial/tongue/throat swelling, SOB or lightheadedness with hypotension: yes Has patient had a PCN reaction causing severe rash involving mucus membranes or skin necrosis: unknown Has patient had a PCN reaction that required hospitalization : unknown Has patient had a PCN reaction occurring within the last 10 years: no If all of the above answers are "NO", then may proceed with Cephalosporin use.  Sulfa Antibiotics Rash   Sulfonamide Derivatives Rash   Biotin Other (See Comments)   Mouth sores   Levofloxacin Other (See Comments)   Joint aches and  weakness   Macrodantin [nitrofurantoin] Other (See Comments)   Unknown reaction   Vitamin B12 Hives   Vitamin D Analogs Hives   Latex Rash        Medication List     STOP taking these medications    fluticasone 50 MCG/ACT nasal spray Commonly known as: FLONASE       TAKE these medications    amLODipine 10 MG tablet Commonly known as: NORVASC Take 10 mg by mouth daily.   benzonatate 200 MG capsule Commonly known as: TESSALON Take 1 capsule (200 mg total) by mouth 3 (three) times daily.   cloNIDine 0.2 MG tablet Commonly known as: CATAPRES Take 0.2 mg by mouth 2 (two) times daily.        Allergies  Allergen Reactions   Aspirin Hives   Codeine Other (See Comments)    REACTION: GI upset   Hydrochlorothiazide Rash   Penicillins Hives, Shortness Of Breath, Rash and Other (See Comments)    Has patient had a PCN reaction causing immediate rash, facial/tongue/throat swelling, SOB or lightheadedness with hypotension: yes Has patient had a PCN reaction causing severe rash involving mucus membranes or skin necrosis: unknown Has patient had a PCN reaction that required hospitalization : unknown Has patient had a PCN reaction occurring within the last 10 years: no If all of the above answers are "NO", then may proceed with Cephalosporin use.    Sulfa Antibiotics Rash   Sulfonamide Derivatives Rash   Biotin Other (See Comments)    Mouth sores   Levofloxacin Other (See Comments)    Joint aches and weakness   Macrodantin [Nitrofurantoin] Other (See Comments)    Unknown reaction   Vitamin B12 Hives   Vitamin D Analogs Hives   Latex Rash    Consultations: Cardiology   Procedures/Studies: ECHOCARDIOGRAM COMPLETE  Result Date: 08/24/2021    ECHOCARDIOGRAM REPORT   Patient Name:   [name] [name] Date of Exam: 08/24/2021 Medical Rec #:  [id number]      Height:       63.0 in Accession #:    [id number]     Weight:       195.1 lb Date of Birth:  10/15/1927     BSA:           1.913 m Patient Age:    56 years       BP:           134/71 mmHg Patient Gender: F              HR:           85 bpm. Exam Location:  [facility] Procedure: 2D Echo and Intracardiac Opacification Agent Indications:     Abnormal ECG R94.31  History:         Patient has prior history of Echocardiogram examinations, most                  recent 07/27/2017.  Sonographer:     Kathlen Brunswick RDCS Referring Phys:  [id number] SHERI HAMMOCK Diagnosing Phys: Kate Sable MD  Sonographer Comments: Technically difficult study due to poor echo windows, suboptimal apical window and no subcostal window. Image acquisition challenging due to patient body habitus and Image acquisition challenging due to respiratory motion. IMPRESSIONS  1. Left ventricular ejection fraction, by estimation, is 55  to 60%. Left ventricular ejection fraction by 2D MOD biplane is 57.0 %. The left ventricle has normal function. The left ventricle has no regional wall motion abnormalities. Left ventricular diastolic parameters are indeterminate.  2. Right ventricular systolic function is normal. The right ventricular size is not well visualized.  3. Left atrial size was severely dilated.  4. Right atrial size was moderately dilated.  5. The mitral valve is normal in structure. Mild mitral valve regurgitation.  6. The aortic valve is tricuspid. Aortic valve regurgitation is not visualized. Aortic valve sclerosis is present, with no evidence of aortic valve stenosis. FINDINGS  Left Ventricle: Left ventricular ejection fraction, by estimation, is 55 to 60%. Left ventricular ejection fraction by 2D MOD biplane is 57.0 %. The left ventricle has normal function. The left ventricle has no regional wall motion abnormalities. Definity contrast agent was given IV to delineate the left ventricular endocardial borders. The left ventricular internal cavity size was normal in size. There is no left ventricular hypertrophy. Left ventricular diastolic parameters are  indeterminate. Right Ventricle: The right ventricular size is not well visualized. No increase in right ventricular wall thickness. Right ventricular systolic function is normal. Left Atrium: Left atrial size was severely dilated. Right Atrium: Right atrial size was moderately dilated. Pericardium: There is no evidence of pericardial effusion. Mitral Valve: The mitral valve is normal in structure. Mild mitral valve regurgitation. Tricuspid Valve: The tricuspid valve is normal in structure. Tricuspid valve regurgitation is mild. Aortic Valve: The aortic valve is tricuspid. Aortic valve regurgitation is not visualized. Aortic valve sclerosis is present, with no evidence of aortic valve stenosis. Aortic valve peak gradient measures 6.8 mmHg. Pulmonic Valve: The pulmonic valve was normal in structure. Pulmonic valve regurgitation is mild. Aorta: The aortic root is normal in size and structure. IAS/Shunts: No atrial level shunt detected by color flow Doppler.  LEFT VENTRICLE PLAX 2D                        Biplane EF (MOD) LVIDd:         4.96 cm         LV Biplane EF:   Left LVIDs:         3.42 cm                          ventricular LV PW:         0.95 cm                          ejection LV IVS:        0.74 cm                          fraction by LVOT diam:     1.80 cm                          2D MOD LV SV:         28                               biplane is LV SV Index:   15  57.0 %. LVOT Area:     2.54 cm                                Diastology                                LV e' medial:    10.20 cm/s LV Volumes (MOD)               LV E/e' medial:  9.8 LV vol d, MOD    49.0 ml       LV e' lateral:   14.70 cm/s A2C:                           LV E/e' lateral: 6.8 LV vol d, MOD    61.6 ml A4C: LV vol s, MOD    17.4 ml A2C: LV vol s, MOD    26.8 ml A4C: LV SV MOD A2C:   31.6 ml LV SV MOD A4C:   61.6 ml LV SV MOD BP:    31.6 ml RIGHT VENTRICLE RV Basal diam:  3.72 cm TAPSE (M-mode): 2.1 cm  LEFT ATRIUM            Index LA diam:      3.90 cm  2.04 cm/m LA Vol (A4C): 127.0 ml 66.38 ml/m  AORTIC VALVE                 PULMONIC VALVE AV Area (Vmax): 1.26 cm     PV Vmax:       1.36 m/s AV Vmax:        130.00 cm/s  PV Peak grad:  7.4 mmHg AV Peak Grad:   6.8 mmHg LVOT Vmax:      64.60 cm/s LVOT Vmean:     37.800 cm/s LVOT VTI:       0.111 m  AORTA Ao Root diam: 3.20 cm Ao Asc diam:  3.40 cm MITRAL VALVE                TRICUSPID VALVE MV Area (PHT): 4.12 cm     TV Peak grad:   28.5 mmHg MV Decel Time: 184 msec     TV Vmax:        2.67 m/s MV E velocity: 100.00 cm/s                             SHUNTS                             Systemic VTI:  0.11 m                             Systemic Diam: 1.80 cm Kate Sable MD Electronically signed by Kate Sable MD Signature Date/Time: 08/24/2021/1:41:43 PM    Final    CT Chest Wo Contrast  Result Date: 08/22/2021 CLINICAL DATA:  Abnormal xray - lung opacity/opacities EXAM: CT CHEST WITHOUT CONTRAST TECHNIQUE: Multidetector CT imaging of the chest was performed following the standard protocol without IV contrast. RADIATION DOSE REDUCTION: This exam was performed according to the departmental dose-optimization program which includes automated exposure control, adjustment of the mA and/or kV according to patient  size and/or use of iterative reconstruction technique. COMPARISON:  06/28/2017 FINDINGS: Cardiovascular: Mild coronary artery calcification. Global cardiac size is mildly enlarged, stable since prior examination. No pericardial effusion. Central pulmonary arteries are markedly enlarged in keeping with changes of pulmonary arterial hypertension. Since the prior examination, there has been progressive dilation of the pulmonary trunk, now measuring 4.1 cm in diameter (previously measuring 3.6 cm when measured in similar fashion). Moderate atherosclerotic calcification of the a thoracic aorta. The descending thoracic aorta is mildly dilated measuring  3.2 cm in diameter within the arch beyond the takeoff of the left subclavian artery. This appears mildly progressive since prior examination where this measured 3.0 cm. Mediastinum/Nodes: Visualized thyroid is unremarkable. No pathologic thoracic adenopathy. Small fluid within the distal esophagus may relate to gastroesophageal reflux or esophageal dysmotility. Large hiatal hernia again noted which appears similar size to prior examination and accounts for the infrahilar opacity noted on recent chest radiograph. Lungs/Pleura: Stable 3 mm pulmonary nodule within the right upper lobe, safely considered benign given its stability over time. Mild bibasilar atelectasis. Lungs are otherwise clear. No pneumothorax or pleural effusion. Central airways are widely patent. Upper Abdomen: Cortical lesion within the interpolar region of the right kidney is incompletely included on this examination and not well assessed, but was previously characterized as a simple cyst on CT of 12/25/2017. No acute abnormality. Musculoskeletal: No acute bone abnormality. Osseous structures are age-appropriate. No lytic or blastic bone lesion. IMPRESSION: 1. Large hiatal hernia, similar in size to prior examination and accounting for the infrahilar opacity noted on recent chest radiograph. 2. Progressive dilation of the pulmonary trunk, now measuring 4.1 cm in diameter, in keeping with changes of progressive pulmonary arterial hypertension. 3. Mild coronary artery calcification. 4. Mild descending thoracic aortic aneurysm measuring 3.2 cm in diameter, mildly progressive since prior examination where this measured 3.0 cm. Recommend annual imaging followup by CTA or MRA. This recommendation follows 2010 ACCF/AHA/AATS/ACR/ASA/SCA/SCAI/SIR/STS/SVM Guidelines for the Diagnosis and Management of Patients with Thoracic Aortic Disease. Circulation.2010; 121: T465-K812. Aortic aneurysm NOS (ICD10-I71.9) 5. Stable 3 mm right upper lobe pulmonary nodule,  safely considered benign given its stability over time. Aortic Atherosclerosis (ICD10-I70.0). Electronically Signed   By: Fidela Salisbury M.D.   On: 08/22/2021 21:59   DG Chest Port 1 View  Result Date: 08/22/2021 CLINICAL DATA:  Questionable sepsis EXAM: PORTABLE CHEST 1 VIEW COMPARISON:  10/31/2015 FINDINGS: Cardiomegaly, vascular congestion. Focal infrahilar opacity noted on the right concerning for pneumonia. No confluent opacity on the left. No effusions or acute bony abnormality. IMPRESSION: Cardiomegaly, vascular congestion. Right infrahilar opacity concerning for pneumonia. Electronically Signed   By: Rolm Baptise M.D.   On: 08/22/2021 18:22      Subjective: Seen and examined on the day of dc.  Stable, no distress.  Appropriate for dc home with home health  Discharge Exam: Vitals:   08/25/21 0520 08/25/21 0857  BP: (!) 156/89 137/72  Pulse: 84 83  Resp: 19 16  Temp: (!) 97.5 F (36.4 C) 98 F (36.7 C)  SpO2: 95% 90%   Vitals:   08/24/21 1642 08/24/21 2154 08/25/21 0520 08/25/21 0857  BP: 139/78 (!) 130/91 (!) 156/89 137/72  Pulse: 80 80 84 83  Resp: '18 19 19 16  '$ Temp: 98.4 F (36.9 C) (!) 97.3 F (36.3 C) (!) 97.5 F (36.4 C) 98 F (36.7 C)  TempSrc:  Oral    SpO2: 97% 95% 95% 90%    General: Pt is alert, awake, not in acute  distress Cardiovascular: RRR, S1/S2 +, no rubs, no gallops Respiratory: CTA bilaterally, no wheezing, no rhonchi Abdominal: Soft, NT, ND, bowel sounds + Extremities: no edema, no cyanosis    The results of significant diagnostics from this hospitalization (including imaging, microbiology, ancillary and laboratory) are listed below for reference.     Microbiology: Recent Results (from the past 240 hour(s))  Blood Culture (routine x 2)     Status: None (Preliminary result)   Collection Time: 08/22/21  6:02 PM   Specimen: BLOOD LEFT FOREARM  Result Value Ref Range Status   Specimen Description BLOOD LEFT FOREARM  Final   Special Requests    Final    BOTTLES DRAWN AEROBIC AND ANAEROBIC Blood Culture results may not be optimal due to an inadequate volume of blood received in culture bottles   Culture   Final    NO GROWTH 3 DAYS Performed at [facility], [address], [place], [place] [address]    Report Status PENDING  Incomplete  SARS Coronavirus 2 by RT PCR (hospital order, performed in [place] hospital lab) *cepheid single result test* Anterior Nasal Swab     Status: None   Collection Time: 08/22/21  8:23 PM   Specimen: Anterior Nasal Swab  Result Value Ref Range Status   SARS Coronavirus 2 by RT PCR NEGATIVE NEGATIVE Final    Comment: (NOTE) SARS-CoV-2 target nucleic acids are NOT DETECTED.  The SARS-CoV-2 RNA is generally detectable in upper and lower respiratory specimens during the acute phase of infection. The lowest concentration of SARS-CoV-2 viral copies this assay can detect is 250 copies / mL. A negative result does not preclude SARS-CoV-2 infection and should not be used as the sole basis for treatment or other patient management decisions.  A negative result may occur with improper specimen collection / handling, submission of specimen other than nasopharyngeal swab, presence of viral mutation(s) within the areas targeted by this assay, and inadequate number of viral copies (<250 copies / mL). A negative result must be combined with clinical observations, patient history, and epidemiological information.  Fact Sheet for Patients:   https://www.patel.info/  Fact Sheet for Healthcare Providers: https://hall.com/  This test is not yet approved or  cleared by the [place] FDA and has been authorized for detection and/or diagnosis of SARS-CoV-2 by FDA under an Emergency Use Authorization (EUA).  This EUA will remain in effect (meaning this test can be used) for the duration of the COVID-19 declaration under Section 564(b)(1) of the Act, 21  U.S.C. section 360bbb-3(b)(1), unless the authorization is terminated or revoked sooner.  Performed at [facility], [address], [place], [place] [address]   Urine Culture     Status: Abnormal   Collection Time: 08/22/21 11:37 PM   Specimen: Urine, Random  Result Value Ref Range Status   Specimen Description   Final    URINE, RANDOM Performed at [facility], [address] [place], [place] [address]    Special Requests   Final    NONE Performed at [facility], [address] [place], [place] [address]    Culture MULTIPLE SPECIES PRESENT, SUGGEST RECOLLECTION (A)  Final   Report Status 08/24/2021 FINAL  Final  Respiratory (~20 pathogens) panel by PCR     Status: None   Collection Time: 08/23/21  2:00 PM   Specimen: Nasopharyngeal Swab; Respiratory  Result Value Ref Range Status   Adenovirus NOT DETECTED NOT DETECTED Final   Coronavirus 229E NOT DETECTED NOT  DETECTED Final    Comment: (NOTE) The Coronavirus on the Respiratory Panel, DOES NOT test for the novel  Coronavirus (2019 nCoV)    Coronavirus HKU1 NOT DETECTED NOT DETECTED Final   Coronavirus NL63 NOT DETECTED NOT DETECTED Final   Coronavirus OC43 NOT DETECTED NOT DETECTED Final   Metapneumovirus NOT DETECTED NOT DETECTED Final   Rhinovirus / Enterovirus NOT DETECTED NOT DETECTED Final   Influenza A NOT DETECTED NOT DETECTED Final   Influenza B NOT DETECTED NOT DETECTED Final   Parainfluenza Virus 1 NOT DETECTED NOT DETECTED Final   Parainfluenza Virus 2 NOT DETECTED NOT DETECTED Final   Parainfluenza Virus 3 NOT DETECTED NOT DETECTED Final   Parainfluenza Virus 4 NOT DETECTED NOT DETECTED Final   Respiratory Syncytial Virus NOT DETECTED NOT DETECTED Final   Bordetella pertussis NOT DETECTED NOT DETECTED Final   Bordetella Parapertussis NOT DETECTED NOT DETECTED Final   Chlamydophila pneumoniae NOT DETECTED NOT DETECTED Final   Mycoplasma pneumoniae NOT DETECTED NOT DETECTED  Final    Comment: Performed at [facility], [address], [place], [place] [address]     Labs: BNP (last 3 results) No results for input(s): "BNP" in the last 8760 hours. Basic Metabolic Panel: Recent Labs  Lab 08/22/21 1802 08/23/21 0349 08/24/21 0859  NA 135 136 136  K 4.2 3.3* 3.7  CL 104 108 107  CO2 22 20* 22  GLUCOSE 140* 126* 117*  BUN '13 9 8  '$ CREATININE 0.73 0.66 0.64  CALCIUM 8.6* 8.2* 8.8*  MG  --   --  2.2   Liver Function Tests: Recent Labs  Lab 08/22/21 1802 08/23/21 0349  AST 29 24  ALT 17 18  ALKPHOS 117 101  BILITOT 1.5* 1.0  PROT 7.4 6.9  ALBUMIN 3.9 3.4*   No results for input(s): "LIPASE", "AMYLASE" in the last 168 hours. No results for input(s): "AMMONIA" in the last 168 hours. CBC: Recent Labs  Lab 08/22/21 1802 08/23/21 0349 08/24/21 0859  WBC 16.6* 11.8* 7.6  NEUTROABS 15.2* 10.4* 5.5  HGB 15.9* 14.6 16.2*  HCT 48.7* 45.8 48.9*  MCV 88.1 89.5 86.9  PLT 307 220 288   Cardiac Enzymes: No results for input(s): "CKTOTAL", "CKMB", "CKMBINDEX", "TROPONINI" in the last 168 hours. BNP: Invalid input(s): "POCBNP" CBG: No results for input(s): "GLUCAP" in the last 168 hours. D-Dimer No results for input(s): "DDIMER" in the last 72 hours. Hgb A1c No results for input(s): "HGBA1C" in the last 72 hours. Lipid Profile No results for input(s): "CHOL", "HDL", "LDLCALC", "TRIG", "CHOLHDL", "LDLDIRECT" in the last 72 hours. Thyroid function studies Recent Labs    08/23/21 0348  TSH 3.607   Anemia work up No results for input(s): "VITAMINB12", "FOLATE", "FERRITIN", "TIBC", "IRON", "RETICCTPCT" in the last 72 hours. Urinalysis    Component Value Date/Time   COLORURINE YELLOW (A) 08/22/2021 2337   APPEARANCEUR HAZY (A) 08/22/2021 2337   LABSPEC 1.012 08/22/2021 2337   PHURINE 6.0 08/22/2021 2337   GLUCOSEU 50 (A) 08/22/2021 2337   HGBUR SMALL (A) 08/22/2021 2337   BILIRUBINUR NEGATIVE 08/22/2021 2337   KETONESUR NEGATIVE  08/22/2021 2337   PROTEINUR NEGATIVE 08/22/2021 2337   UROBILINOGEN 0.2 07/25/2014 1505   NITRITE NEGATIVE 08/22/2021 2337   LEUKOCYTESUR LARGE (A) 08/22/2021 2337   Sepsis Labs Recent Labs  Lab 08/22/21 1802 08/23/21 0349 08/24/21 0859  WBC 16.6* 11.8* 7.6   Microbiology Recent Results (from the past 240 hour(s))  Blood Culture (routine x 2)  Status: None (Preliminary result)   Collection Time: 08/22/21  6:02 PM   Specimen: BLOOD LEFT FOREARM  Result Value Ref Range Status   Specimen Description BLOOD LEFT FOREARM  Final   Special Requests   Final    BOTTLES DRAWN AEROBIC AND ANAEROBIC Blood Culture results may not be optimal due to an inadequate volume of blood received in culture bottles   Culture   Final    NO GROWTH 3 DAYS Performed at [facility], [address] [place], [place] [address]    Report Status PENDING  Incomplete  SARS Coronavirus 2 by RT PCR (hospital order, performed in [place] hospital lab) *cepheid single result test* Anterior Nasal Swab     Status: None   Collection Time: 08/22/21  8:23 PM   Specimen: Anterior Nasal Swab  Result Value Ref Range Status   SARS Coronavirus 2 by RT PCR NEGATIVE NEGATIVE Final    Comment: (NOTE) SARS-CoV-2 target nucleic acids are NOT DETECTED.  The SARS-CoV-2 RNA is generally detectable in upper and lower respiratory specimens during the acute phase of infection. The lowest concentration of SARS-CoV-2 viral copies this assay can detect is 250 copies / mL. A negative result does not preclude SARS-CoV-2 infection and should not be used as the sole basis for treatment or other patient management decisions.  A negative result may occur with improper specimen collection / handling, submission of specimen other than nasopharyngeal swab, presence of viral mutation(s) within the areas targeted by this assay, and inadequate number of viral copies (<250 copies / mL). A negative result must be combined with  clinical observations, patient history, and epidemiological information.  Fact Sheet for Patients:   https://www.patel.info/  Fact Sheet for Healthcare Providers: https://hall.com/  This test is not yet approved or  cleared by the [place] FDA and has been authorized for detection and/or diagnosis of SARS-CoV-2 by FDA under an Emergency Use Authorization (EUA).  This EUA will remain in effect (meaning this test can be used) for the duration of the COVID-19 declaration under Section 564(b)(1) of the Act, 21 U.S.C. section 360bbb-3(b)(1), unless the authorization is terminated or revoked sooner.  Performed at [facility], [address] [place], [place] [address]   Urine Culture     Status: Abnormal   Collection Time: 08/22/21 11:37 PM   Specimen: Urine, Random  Result Value Ref Range Status   Specimen Description   Final    URINE, RANDOM Performed at [facility], [address] [place], [place] [address]    Special Requests   Final    NONE Performed at [facility], [place] [place], [place] [address]    Culture MULTIPLE SPECIES PRESENT, SUGGEST RECOLLECTION (A)  Final   Report Status 08/24/2021 FINAL  Final  Respiratory (~20 pathogens) panel by PCR     Status: None   Collection Time: 08/23/21  2:00 PM   Specimen: Nasopharyngeal Swab; Respiratory  Result Value Ref Range Status   Adenovirus NOT DETECTED NOT DETECTED Final   Coronavirus 229E NOT DETECTED NOT DETECTED Final    Comment: (NOTE) The Coronavirus on the Respiratory Panel, DOES NOT test for the novel  Coronavirus (2019 nCoV)    Coronavirus HKU1 NOT DETECTED NOT DETECTED Final   Coronavirus NL63 NOT DETECTED NOT DETECTED Final   Coronavirus OC43 NOT DETECTED NOT DETECTED Final   Metapneumovirus NOT DETECTED NOT DETECTED Final   Rhinovirus / Enterovirus NOT DETECTED NOT DETECTED Final   Influenza A  NOT DETECTED NOT DETECTED  Final   Influenza B NOT DETECTED NOT DETECTED Final   Parainfluenza Virus 1 NOT DETECTED NOT DETECTED Final   Parainfluenza Virus 2 NOT DETECTED NOT DETECTED Final   Parainfluenza Virus 3 NOT DETECTED NOT DETECTED Final   Parainfluenza Virus 4 NOT DETECTED NOT DETECTED Final   Respiratory Syncytial Virus NOT DETECTED NOT DETECTED Final   Bordetella pertussis NOT DETECTED NOT DETECTED Final   Bordetella Parapertussis NOT DETECTED NOT DETECTED Final   Chlamydophila pneumoniae NOT DETECTED NOT DETECTED Final   Mycoplasma pneumoniae NOT DETECTED NOT DETECTED Final    Comment: Performed at [facility], [place] [address] [place], [place] [address]     Time coordinating discharge: Over 30 minutes  SIGNED:   Sidney Ace, MD  [facility] 08/25/2021, 4:20 PM Pager   If 7PM-7AM, please contact night-coverage

## 2021-08-25 NOTE — Progress Notes (Signed)
   08/24/21 2100  Mobility  HOB Elevated/Bed Position Self regulated  Activity Ambulated with assistance to bathroom;Ambulated with assistance in room  Range of Motion/Exercises Active;All extremities  Level of Assistance Standby assist, set-up cues, supervision of patient - no hands on  Assistive Device Front wheel walker  Distance Ambulated (ft) 20 ft  Activity Response Tolerated well

## 2021-08-25 NOTE — Plan of Care (Signed)
  Problem: Education: Goal: Knowledge of General Education information will improve Description: Including pain rating scale, medication(s)/side effects and non-pharmacologic comfort measures 08/25/2021 1620 by Mancel Bale, RN Outcome: Adequate for Discharge 08/25/2021 1041 by Mancel Bale, RN Outcome: Progressing   Problem: Health Behavior/Discharge Planning: Goal: Ability to manage health-related needs will improve 08/25/2021 1620 by Mancel Bale, RN Outcome: Adequate for Discharge 08/25/2021 1041 by Mancel Bale, RN Outcome: Progressing   Problem: Clinical Measurements: Goal: Ability to maintain clinical measurements within normal limits will improve 08/25/2021 1620 by Mancel Bale, RN Outcome: Adequate for Discharge 08/25/2021 1041 by Mancel Bale, RN Outcome: Progressing Goal: Will remain free from infection 08/25/2021 1620 by Mancel Bale, RN Outcome: Adequate for Discharge 08/25/2021 1041 by Mancel Bale, RN Outcome: Progressing Goal: Diagnostic test results will improve 08/25/2021 1620 by Mancel Bale, RN Outcome: Adequate for Discharge 08/25/2021 1041 by Mancel Bale, RN Outcome: Progressing Goal: Respiratory complications will improve 08/25/2021 1620 by Mancel Bale, RN Outcome: Adequate for Discharge 08/25/2021 1041 by Mancel Bale, RN Outcome: Progressing Goal: Cardiovascular complication will be avoided 08/25/2021 1620 by Mancel Bale, RN Outcome: Adequate for Discharge 08/25/2021 1041 by Mancel Bale, RN Outcome: Progressing   Problem: Activity: Goal: Risk for activity intolerance will decrease 08/25/2021 1620 by Mancel Bale, RN Outcome: Adequate for Discharge 08/25/2021 1041 by Mancel Bale, RN Outcome: Progressing   Problem: Nutrition: Goal: Adequate nutrition will be maintained 08/25/2021 1620 by Mancel Bale, RN Outcome: Adequate for Discharge 08/25/2021 1041 by Mancel Bale, RN Outcome:  Progressing   Problem: Coping: Goal: Level of anxiety will decrease 08/25/2021 1620 by Mancel Bale, RN Outcome: Adequate for Discharge 08/25/2021 1041 by Mancel Bale, RN Outcome: Progressing   Problem: Elimination: Goal: Will not experience complications related to bowel motility 08/25/2021 1620 by Mancel Bale, RN Outcome: Adequate for Discharge 08/25/2021 1041 by Mancel Bale, RN Outcome: Progressing Goal: Will not experience complications related to urinary retention 08/25/2021 1620 by Mancel Bale, RN Outcome: Adequate for Discharge 08/25/2021 1041 by Mancel Bale, RN Outcome: Progressing   Problem: Pain Managment: Goal: General experience of comfort will improve 08/25/2021 1620 by Mancel Bale, RN Outcome: Adequate for Discharge 08/25/2021 1041 by Mancel Bale, RN Outcome: Progressing   Problem: Safety: Goal: Ability to remain free from injury will improve 08/25/2021 1620 by Mancel Bale, RN Outcome: Adequate for Discharge 08/25/2021 1041 by Mancel Bale, RN Outcome: Progressing   Problem: Skin Integrity: Goal: Risk for impaired skin integrity will decrease 08/25/2021 1620 by Mancel Bale, RN Outcome: Adequate for Discharge 08/25/2021 1041 by Mancel Bale, RN Outcome: Progressing

## 2021-08-25 NOTE — TOC Progression Note (Addendum)
Transition of Care [facility] - Progression Note    Patient Details  Name: [name] [name] MRN: [id number] Date of Birth: [birth year]  Transition of [facility]) CM/SW Contact  Izola Price, RN Phone Number: 08/25/2021, 12:27 PM  Clinical Narrative: 7/16: 1230 pm. Contact with CSW/APS case worker and escalating contact due to discharge hold as patient medically ready for discharge. APS supervisor indicated that contact in hospital setting preferred and will get a case worker to see patient today. Provider updated. CM supervisor updated. Need for strict confidentiality reinforced with patient care staff/CN. Barbie Mikeria Valin RN CM   (This is a restricted note from patient due to potential harm/privacy issues).          Expected Discharge Plan and Services           Expected Discharge Date: 08/25/21                                     Social Determinants of Health (SDOH) Interventions    Readmission Risk Interventions     No data to display

## 2021-08-27 LAB — CULTURE, BLOOD (ROUTINE X 2): Culture: NO GROWTH

## 2021-08-28 LAB — LEGIONELLA PNEUMOPHILA SEROGP 1 UR AG: L. pneumophila Serogp 1 Ur Ag: NEGATIVE

## 2021-08-30 DIAGNOSIS — I480 Paroxysmal atrial fibrillation: Secondary | ICD-10-CM | POA: Diagnosis not present

## 2021-08-30 DIAGNOSIS — G250 Essential tremor: Secondary | ICD-10-CM | POA: Diagnosis not present

## 2021-08-30 DIAGNOSIS — R32 Unspecified urinary incontinence: Secondary | ICD-10-CM | POA: Diagnosis not present

## 2021-08-30 DIAGNOSIS — D510 Vitamin B12 deficiency anemia due to intrinsic factor deficiency: Secondary | ICD-10-CM | POA: Diagnosis not present

## 2021-08-30 DIAGNOSIS — G454 Transient global amnesia: Secondary | ICD-10-CM | POA: Diagnosis not present

## 2021-08-30 DIAGNOSIS — Z85038 Personal history of other malignant neoplasm of large intestine: Secondary | ICD-10-CM | POA: Diagnosis not present

## 2021-08-30 DIAGNOSIS — I10 Essential (primary) hypertension: Secondary | ICD-10-CM | POA: Diagnosis not present

## 2021-08-30 DIAGNOSIS — R053 Chronic cough: Secondary | ICD-10-CM | POA: Diagnosis not present

## 2021-08-30 DIAGNOSIS — Z9181 History of falling: Secondary | ICD-10-CM | POA: Diagnosis not present

## 2021-08-30 DIAGNOSIS — Z87440 Personal history of urinary (tract) infections: Secondary | ICD-10-CM | POA: Diagnosis not present

## 2021-08-30 DIAGNOSIS — D509 Iron deficiency anemia, unspecified: Secondary | ICD-10-CM | POA: Diagnosis not present

## 2021-09-03 ENCOUNTER — Other Ambulatory Visit: Payer: MEDICARE

## 2021-09-03 ENCOUNTER — Ambulatory Visit: Payer: MEDICARE | Admitting: Hematology

## 2021-09-03 DIAGNOSIS — D510 Vitamin B12 deficiency anemia due to intrinsic factor deficiency: Secondary | ICD-10-CM | POA: Diagnosis not present

## 2021-09-03 DIAGNOSIS — D509 Iron deficiency anemia, unspecified: Secondary | ICD-10-CM | POA: Diagnosis not present

## 2021-09-03 DIAGNOSIS — G250 Essential tremor: Secondary | ICD-10-CM | POA: Diagnosis not present

## 2021-09-03 DIAGNOSIS — I480 Paroxysmal atrial fibrillation: Secondary | ICD-10-CM | POA: Diagnosis not present

## 2021-09-03 DIAGNOSIS — R053 Chronic cough: Secondary | ICD-10-CM | POA: Diagnosis not present

## 2021-09-03 DIAGNOSIS — I10 Essential (primary) hypertension: Secondary | ICD-10-CM | POA: Diagnosis not present

## 2021-09-05 DIAGNOSIS — R053 Chronic cough: Secondary | ICD-10-CM | POA: Diagnosis not present

## 2021-09-05 DIAGNOSIS — D510 Vitamin B12 deficiency anemia due to intrinsic factor deficiency: Secondary | ICD-10-CM | POA: Diagnosis not present

## 2021-09-05 DIAGNOSIS — G250 Essential tremor: Secondary | ICD-10-CM | POA: Diagnosis not present

## 2021-09-05 DIAGNOSIS — D509 Iron deficiency anemia, unspecified: Secondary | ICD-10-CM | POA: Diagnosis not present

## 2021-09-05 DIAGNOSIS — I480 Paroxysmal atrial fibrillation: Secondary | ICD-10-CM | POA: Diagnosis not present

## 2021-09-05 DIAGNOSIS — I10 Essential (primary) hypertension: Secondary | ICD-10-CM | POA: Diagnosis not present

## 2021-09-09 DIAGNOSIS — D509 Iron deficiency anemia, unspecified: Secondary | ICD-10-CM | POA: Diagnosis not present

## 2021-09-09 DIAGNOSIS — G250 Essential tremor: Secondary | ICD-10-CM | POA: Diagnosis not present

## 2021-09-09 DIAGNOSIS — I10 Essential (primary) hypertension: Secondary | ICD-10-CM | POA: Diagnosis not present

## 2021-09-09 DIAGNOSIS — D510 Vitamin B12 deficiency anemia due to intrinsic factor deficiency: Secondary | ICD-10-CM | POA: Diagnosis not present

## 2021-09-09 DIAGNOSIS — I480 Paroxysmal atrial fibrillation: Secondary | ICD-10-CM | POA: Diagnosis not present

## 2021-09-09 DIAGNOSIS — R053 Chronic cough: Secondary | ICD-10-CM | POA: Diagnosis not present

## 2021-09-10 DIAGNOSIS — D509 Iron deficiency anemia, unspecified: Secondary | ICD-10-CM | POA: Diagnosis not present

## 2021-09-10 DIAGNOSIS — I10 Essential (primary) hypertension: Secondary | ICD-10-CM | POA: Diagnosis not present

## 2021-09-10 DIAGNOSIS — I480 Paroxysmal atrial fibrillation: Secondary | ICD-10-CM | POA: Diagnosis not present

## 2021-09-10 DIAGNOSIS — G250 Essential tremor: Secondary | ICD-10-CM | POA: Diagnosis not present

## 2021-09-10 DIAGNOSIS — R053 Chronic cough: Secondary | ICD-10-CM | POA: Diagnosis not present

## 2021-09-10 DIAGNOSIS — D510 Vitamin B12 deficiency anemia due to intrinsic factor deficiency: Secondary | ICD-10-CM | POA: Diagnosis not present

## 2021-09-11 DIAGNOSIS — Z09 Encounter for follow-up examination after completed treatment for conditions other than malignant neoplasm: Secondary | ICD-10-CM | POA: Diagnosis not present

## 2021-09-11 DIAGNOSIS — R030 Elevated blood-pressure reading, without diagnosis of hypertension: Secondary | ICD-10-CM | POA: Diagnosis not present

## 2021-09-11 DIAGNOSIS — R899 Unspecified abnormal finding in specimens from other organs, systems and tissues: Secondary | ICD-10-CM | POA: Diagnosis not present

## 2021-09-11 DIAGNOSIS — E538 Deficiency of other specified B group vitamins: Secondary | ICD-10-CM | POA: Diagnosis not present

## 2021-09-11 DIAGNOSIS — R531 Weakness: Secondary | ICD-10-CM | POA: Diagnosis not present

## 2021-09-11 DIAGNOSIS — I471 Supraventricular tachycardia: Secondary | ICD-10-CM | POA: Diagnosis not present

## 2021-09-11 DIAGNOSIS — J189 Pneumonia, unspecified organism: Secondary | ICD-10-CM | POA: Diagnosis not present

## 2021-09-12 DIAGNOSIS — G250 Essential tremor: Secondary | ICD-10-CM | POA: Diagnosis not present

## 2021-09-12 DIAGNOSIS — I480 Paroxysmal atrial fibrillation: Secondary | ICD-10-CM | POA: Diagnosis not present

## 2021-09-12 DIAGNOSIS — I10 Essential (primary) hypertension: Secondary | ICD-10-CM | POA: Diagnosis not present

## 2021-09-12 DIAGNOSIS — D509 Iron deficiency anemia, unspecified: Secondary | ICD-10-CM | POA: Diagnosis not present

## 2021-09-12 DIAGNOSIS — R053 Chronic cough: Secondary | ICD-10-CM | POA: Diagnosis not present

## 2021-09-12 DIAGNOSIS — D510 Vitamin B12 deficiency anemia due to intrinsic factor deficiency: Secondary | ICD-10-CM | POA: Diagnosis not present

## 2021-09-13 DIAGNOSIS — R300 Dysuria: Secondary | ICD-10-CM | POA: Diagnosis not present

## 2021-09-14 ENCOUNTER — Telehealth: Payer: Self-pay | Admitting: Physician Assistant

## 2021-09-14 NOTE — Telephone Encounter (Signed)
Patient's daughter called because her blood pressure is much lower than usual and she is concerned about giving her her medications.  Systolic blood pressure as low as 105, heart rate 85.  O2 saturation 91-93%, a little lower than she was in the hospital  She feels weaker than usual, but according to the patient only slightly more weak than she is at baseline.  Requested the daughter weigh her, her weight was 105 pounds, only 1 pound from when she was in the hospital.  She is not feeling more short of breath than usual.  No complaints of chest pain or palpitations, heart rate has been normal.  No bleeding issues.  Her daughter took her to the doctor's office yesterday and she was diagnosed with a UTI.  She has been started on antibiotics and is compliant with them.  A culture was done, but the results have not been reviewed.  The results are not in the [facility] system or Care Everywhere.  She is normally on amlodipine 10 mg daily and clonidine 0.2 mg twice daily.  Requested that she cut the amlodipine in half and cut the clonidine in half as well.  Wait a bit before giving the medications, make sure she eats a good breakfast.  I explained that Ms. Jordan would need to be watched carefully, to make sure she is not getting septic.  Any additional concerns, call back.  Rosaria Ferries, PA-C 09/14/2021 8:34 AM

## 2021-09-19 DIAGNOSIS — D510 Vitamin B12 deficiency anemia due to intrinsic factor deficiency: Secondary | ICD-10-CM | POA: Diagnosis not present

## 2021-09-19 DIAGNOSIS — I10 Essential (primary) hypertension: Secondary | ICD-10-CM | POA: Diagnosis not present

## 2021-09-19 DIAGNOSIS — D509 Iron deficiency anemia, unspecified: Secondary | ICD-10-CM | POA: Diagnosis not present

## 2021-09-19 DIAGNOSIS — R053 Chronic cough: Secondary | ICD-10-CM | POA: Diagnosis not present

## 2021-09-19 DIAGNOSIS — G250 Essential tremor: Secondary | ICD-10-CM | POA: Diagnosis not present

## 2021-09-19 DIAGNOSIS — I480 Paroxysmal atrial fibrillation: Secondary | ICD-10-CM | POA: Diagnosis not present

## 2021-09-23 DIAGNOSIS — R21 Rash and other nonspecific skin eruption: Secondary | ICD-10-CM | POA: Diagnosis not present

## 2021-09-23 DIAGNOSIS — R300 Dysuria: Secondary | ICD-10-CM | POA: Diagnosis not present

## 2021-09-24 DIAGNOSIS — D510 Vitamin B12 deficiency anemia due to intrinsic factor deficiency: Secondary | ICD-10-CM | POA: Diagnosis not present

## 2021-09-24 DIAGNOSIS — D509 Iron deficiency anemia, unspecified: Secondary | ICD-10-CM | POA: Diagnosis not present

## 2021-09-24 DIAGNOSIS — R053 Chronic cough: Secondary | ICD-10-CM | POA: Diagnosis not present

## 2021-09-24 DIAGNOSIS — I10 Essential (primary) hypertension: Secondary | ICD-10-CM | POA: Diagnosis not present

## 2021-09-24 DIAGNOSIS — I480 Paroxysmal atrial fibrillation: Secondary | ICD-10-CM | POA: Diagnosis not present

## 2021-09-24 DIAGNOSIS — G250 Essential tremor: Secondary | ICD-10-CM | POA: Diagnosis not present

## 2021-09-29 DIAGNOSIS — Z9181 History of falling: Secondary | ICD-10-CM | POA: Diagnosis not present

## 2021-09-29 DIAGNOSIS — G454 Transient global amnesia: Secondary | ICD-10-CM | POA: Diagnosis not present

## 2021-09-29 DIAGNOSIS — I10 Essential (primary) hypertension: Secondary | ICD-10-CM | POA: Diagnosis not present

## 2021-09-29 DIAGNOSIS — G250 Essential tremor: Secondary | ICD-10-CM | POA: Diagnosis not present

## 2021-09-29 DIAGNOSIS — D510 Vitamin B12 deficiency anemia due to intrinsic factor deficiency: Secondary | ICD-10-CM | POA: Diagnosis not present

## 2021-09-29 DIAGNOSIS — I480 Paroxysmal atrial fibrillation: Secondary | ICD-10-CM | POA: Diagnosis not present

## 2021-09-29 DIAGNOSIS — R053 Chronic cough: Secondary | ICD-10-CM | POA: Diagnosis not present

## 2021-09-29 DIAGNOSIS — D509 Iron deficiency anemia, unspecified: Secondary | ICD-10-CM | POA: Diagnosis not present

## 2021-09-29 DIAGNOSIS — Z85038 Personal history of other malignant neoplasm of large intestine: Secondary | ICD-10-CM | POA: Diagnosis not present

## 2021-09-29 DIAGNOSIS — R32 Unspecified urinary incontinence: Secondary | ICD-10-CM | POA: Diagnosis not present

## 2021-09-29 DIAGNOSIS — Z87440 Personal history of urinary (tract) infections: Secondary | ICD-10-CM | POA: Diagnosis not present

## 2021-09-30 NOTE — Progress Notes (Unsigned)
Cardiology Office Note   Date:  10/01/2021   ID:  [name] [name], [place] Aug 06, 1927, MRN [id number]  PCP:  Lawerance Cruel, MD  Cardiologist:   Minus Breeding, MD   Chief Complaint  Patient presents with   Atrial Fibrillation       History of Present Illness: [name] [name] is a 88 y.o. female who presents for follow up of HTN.   Since I last saw her she was in the hospital in July.  I reviewed these records for this appointment.  She had fevers and chills but there was no infectious etiology identified.  She was found to have atrial fibrillation.  She was seen by our service.  She had an echocardiogram which demonstrated a well-preserved ejection fraction.  There were no significant valvular abnormalities.  She had left atrial dilatation.  She refused anticoagulation however.  Since going home she has had no new cardiovascular complaints.  She does feel the heart palpating once in a while but she is not having any presyncope or syncope.  She is not having any chest pressure, neck or arm discomfort.  She had no new edema though she has some mild lower extremity swelling.  She had no weight gain.  She gets around slowly with a walker at home.   Past Medical History:  Diagnosis Date   Anemia    Atrial fibrillation ([place])    Atrial tachycardia ([place])    Cancer ([facility])    colon   [place], HX OF 09/07/2008   Qualifier: Diagnosis of  By: Asa Lente MD, Mateo Flow A    Cough    yellow to clear sputum cough for a while per pt no fever   HCAP (healthcare-associated pneumonia) 10/31/2015   History of blood transfusion last transfusion 09-29-17   HTN (hypertension)    Jaundice due to hepatitis    at age 89 or 39   Pneumonia 2014   Transient global amnesia few yrs ago x 2   AMS   Tremors of nervous system    benign familial    Past Surgical History:  Procedure Laterality Date   ABDOMINAL HERNIA REPAIR  yrs ago   BACK SURGERY     years ago lower   LAPAROSCOPIC PARTIAL COLECTOMY N/A  11/04/2017   Procedure: LAPAROSCOPIC RIGHT HEMICOLECTOMY;  Surgeon: Excell Seltzer, MD;  Location: [facility];  Service: General;  Laterality: N/A;   TONSILLECTOMY  age 49     Current Outpatient Medications  Medication Sig Dispense Refill   amLODipine (NORVASC) 10 MG tablet Take 10 mg by mouth daily. 1/2 a tablet     cloNIDine (CATAPRES) 0.2 MG tablet Take 0.2 mg by mouth 2 (two) times daily.     No current facility-administered medications for this visit.    Allergies:   Aspirin, Codeine, Hydrochlorothiazide, Penicillins, Sulfa antibiotics, Sulfonamide derivatives, Biotin, Levofloxacin, Macrodantin [nitrofurantoin], Vitamin b12, Vitamin d analogs, and Latex    ROS:  Please see the history of present illness.   Otherwise, review of systems are positive for NONE.   All other systems are reviewed and negative.    PHYSICAL EXAM: VS:  BP (!) 151/90   Pulse 95   Ht '5\' 3"'$  (1.6 m)   Wt 181 lb 3.2 oz (82.2 kg)   SpO2 98%   BMI 32.10 kg/m  , BMI Body mass index is 32.1 kg/m. GEN:  No distress NECK:  No jugular venous distention at 90 degrees, waveform within normal limits, carotid upstroke brisk and  symmetric, no bruits, no thyromegaly LYMPHATICS:  No cervical adenopathy LUNGS:  Clear to auscultation bilaterally BACK:  No CVA tenderness CHEST:  Unremarkable HEART:  S1 and S2 within normal limits, no S3, no clicks, no rubs, no murmurs, irregular ABD:  Positive bowel sounds normal in frequency in pitch, no bruits, no rebound, no guarding, unable to assess midline mass or bruit with the patient seated. EXT:  2 plus pulses throughout, mild bilateral edema, no cyanosis no clubbing SKIN:  No rashes no nodules NEURO:  Cranial nerves II through XII grossly intact, motor grossly intact throughout PSYCH:  Cognitively intact, oriented to person place and time   \EKG:  EKG is  ordered today. The ekg ordered today demonstrates atrial fibrillation, rate 86, axis within normal limits, intervals  within normal limits, low voltage in the limb leads and chest leads.   Recent Labs: 08/23/2021: ALT 18; TSH 3.607 08/24/2021: BUN 8; Creatinine, Ser 0.64; Hemoglobin 16.2; Magnesium 2.2; Platelets 288; Potassium 3.7; Sodium 136    Lipid Panel    Component Value Date/Time   CHOL (H) 01/10/2007 0003    236        ATP III CLASSIFICATION:  <200     mg/dL   Desirable  200-239  mg/dL   Borderline High  >=240    mg/dL   High   TRIG 132 01/10/2007 0003   HDL 42 01/10/2007 0003   CHOLHDL 5.6 01/10/2007 0003   VLDL 26 01/10/2007 0003   LDLCALC (H) 01/10/2007 0003    168        Total Cholesterol/HDL:CHD Risk Coronary Heart Disease Risk Table                     Men   Women  1/2 Average Risk   3.4   3.3      Wt Readings from Last 3 Encounters:  10/01/21 181 lb 3.2 oz (82.2 kg)  05/27/21 195 lb 1.6 oz (88.5 kg)  10/05/20 191 lb 9.6 oz (86.9 kg)      Other studies Reviewed: Additional studies/ records that were reviewed today include: Hospital records. Review of the above records demonstrates:  Please see elsewhere in the note.     ASSESSMENT AND PLAN:  ESSENTIAL HYPERTENSION, BENIGN -  Her blood pressure is elevated today but well controlled at home and I reviewed her blood pressure diary.  She actually had to have her dose of medications reduced because she was hypotensive after she got home.  She likes being on the lower dose and so I will not make any changes.   EDEMA:  This has been baseline.  No change in medications   ATRIAL FIB:   She has had atrial fibrillation documented.  We did have a conversation about the risk of stroke.  She has had bleeding hemorrhoids and absolutely does not want to take an anticoagulant.  She clearly would not want to be considered for a Watchman.  Her rate was controlled in the hospital.  Therefore, no change in therapy  Current medicines are reviewed at length with the patient today.  The patient does not have concerns regarding  medicines.  The following changes have been made: None  Labs/ tests ordered today include: None 1 year  Orders Placed This Encounter  Procedures   EKG 12-Lead      Disposition:   FU with me in one year.   Signed, Minus Breeding, MD  10/01/2021 2:54 PM    [facility]

## 2021-10-01 ENCOUNTER — Ambulatory Visit (INDEPENDENT_AMBULATORY_CARE_PROVIDER_SITE_OTHER): Payer: MEDICARE | Admitting: Cardiology

## 2021-10-01 ENCOUNTER — Encounter: Payer: Self-pay | Admitting: Cardiology

## 2021-10-01 VITALS — BP 151/90 | HR 95 | Ht 63.0 in | Wt 181.2 lb

## 2021-10-01 DIAGNOSIS — I10 Essential (primary) hypertension: Secondary | ICD-10-CM

## 2021-10-01 DIAGNOSIS — M7989 Other specified soft tissue disorders: Secondary | ICD-10-CM

## 2021-10-01 NOTE — Patient Instructions (Addendum)

## 2021-10-02 DIAGNOSIS — I10 Essential (primary) hypertension: Secondary | ICD-10-CM | POA: Diagnosis not present

## 2021-10-02 DIAGNOSIS — D510 Vitamin B12 deficiency anemia due to intrinsic factor deficiency: Secondary | ICD-10-CM | POA: Diagnosis not present

## 2021-10-02 DIAGNOSIS — I480 Paroxysmal atrial fibrillation: Secondary | ICD-10-CM | POA: Diagnosis not present

## 2021-10-02 DIAGNOSIS — R053 Chronic cough: Secondary | ICD-10-CM | POA: Diagnosis not present

## 2021-10-02 DIAGNOSIS — D509 Iron deficiency anemia, unspecified: Secondary | ICD-10-CM | POA: Diagnosis not present

## 2021-10-02 DIAGNOSIS — G250 Essential tremor: Secondary | ICD-10-CM | POA: Diagnosis not present

## 2021-10-09 DIAGNOSIS — R053 Chronic cough: Secondary | ICD-10-CM | POA: Diagnosis not present

## 2021-10-09 DIAGNOSIS — I480 Paroxysmal atrial fibrillation: Secondary | ICD-10-CM | POA: Diagnosis not present

## 2021-10-09 DIAGNOSIS — G250 Essential tremor: Secondary | ICD-10-CM | POA: Diagnosis not present

## 2021-10-09 DIAGNOSIS — D510 Vitamin B12 deficiency anemia due to intrinsic factor deficiency: Secondary | ICD-10-CM | POA: Diagnosis not present

## 2021-10-09 DIAGNOSIS — D509 Iron deficiency anemia, unspecified: Secondary | ICD-10-CM | POA: Diagnosis not present

## 2021-10-09 DIAGNOSIS — I10 Essential (primary) hypertension: Secondary | ICD-10-CM | POA: Diagnosis not present

## 2021-10-16 DIAGNOSIS — I10 Essential (primary) hypertension: Secondary | ICD-10-CM | POA: Diagnosis not present

## 2021-10-16 DIAGNOSIS — D509 Iron deficiency anemia, unspecified: Secondary | ICD-10-CM | POA: Diagnosis not present

## 2021-10-16 DIAGNOSIS — G250 Essential tremor: Secondary | ICD-10-CM | POA: Diagnosis not present

## 2021-10-16 DIAGNOSIS — I480 Paroxysmal atrial fibrillation: Secondary | ICD-10-CM | POA: Diagnosis not present

## 2021-10-16 DIAGNOSIS — R053 Chronic cough: Secondary | ICD-10-CM | POA: Diagnosis not present

## 2021-10-16 DIAGNOSIS — D510 Vitamin B12 deficiency anemia due to intrinsic factor deficiency: Secondary | ICD-10-CM | POA: Diagnosis not present

## 2021-10-21 DIAGNOSIS — R053 Chronic cough: Secondary | ICD-10-CM | POA: Diagnosis not present

## 2021-10-21 DIAGNOSIS — I10 Essential (primary) hypertension: Secondary | ICD-10-CM | POA: Diagnosis not present

## 2021-10-21 DIAGNOSIS — I480 Paroxysmal atrial fibrillation: Secondary | ICD-10-CM | POA: Diagnosis not present

## 2021-10-21 DIAGNOSIS — D509 Iron deficiency anemia, unspecified: Secondary | ICD-10-CM | POA: Diagnosis not present

## 2021-10-21 DIAGNOSIS — G250 Essential tremor: Secondary | ICD-10-CM | POA: Diagnosis not present

## 2021-10-21 DIAGNOSIS — D510 Vitamin B12 deficiency anemia due to intrinsic factor deficiency: Secondary | ICD-10-CM | POA: Diagnosis not present

## 2021-10-24 ENCOUNTER — Ambulatory Visit: Payer: MEDICARE | Admitting: Obstetrics & Gynecology

## 2021-10-24 ENCOUNTER — Other Ambulatory Visit: Payer: Self-pay

## 2021-10-24 DIAGNOSIS — G250 Essential tremor: Secondary | ICD-10-CM | POA: Diagnosis not present

## 2021-10-24 DIAGNOSIS — D510 Vitamin B12 deficiency anemia due to intrinsic factor deficiency: Secondary | ICD-10-CM | POA: Diagnosis not present

## 2021-10-24 DIAGNOSIS — I10 Essential (primary) hypertension: Secondary | ICD-10-CM | POA: Diagnosis not present

## 2021-10-24 DIAGNOSIS — C180 Malignant neoplasm of cecum: Secondary | ICD-10-CM

## 2021-10-24 DIAGNOSIS — I480 Paroxysmal atrial fibrillation: Secondary | ICD-10-CM | POA: Diagnosis not present

## 2021-10-24 DIAGNOSIS — R053 Chronic cough: Secondary | ICD-10-CM | POA: Diagnosis not present

## 2021-10-24 DIAGNOSIS — D509 Iron deficiency anemia, unspecified: Secondary | ICD-10-CM | POA: Diagnosis not present

## 2021-10-25 ENCOUNTER — Inpatient Hospital Stay: Payer: MEDICARE | Attending: Hematology

## 2021-10-25 ENCOUNTER — Other Ambulatory Visit: Payer: Self-pay

## 2021-10-25 DIAGNOSIS — C180 Malignant neoplasm of cecum: Secondary | ICD-10-CM

## 2021-10-25 DIAGNOSIS — E538 Deficiency of other specified B group vitamins: Secondary | ICD-10-CM | POA: Diagnosis not present

## 2021-10-25 DIAGNOSIS — I10 Essential (primary) hypertension: Secondary | ICD-10-CM | POA: Diagnosis not present

## 2021-10-25 DIAGNOSIS — Z801 Family history of malignant neoplasm of trachea, bronchus and lung: Secondary | ICD-10-CM | POA: Insufficient documentation

## 2021-10-25 DIAGNOSIS — D509 Iron deficiency anemia, unspecified: Secondary | ICD-10-CM | POA: Diagnosis not present

## 2021-10-25 LAB — CBC WITH DIFFERENTIAL (CANCER CENTER ONLY)
Abs Immature Granulocytes: 0.01 10*3/uL (ref 0.00–0.07)
Basophils Absolute: 0 10*3/uL (ref 0.0–0.1)
Basophils Relative: 1 %
Eosinophils Absolute: 0.1 10*3/uL (ref 0.0–0.5)
Eosinophils Relative: 2 %
HCT: 47.3 % — ABNORMAL HIGH (ref 36.0–46.0)
Hemoglobin: 15.7 g/dL — ABNORMAL HIGH (ref 12.0–15.0)
Immature Granulocytes: 0 %
Lymphocytes Relative: 22 %
Lymphs Abs: 1.3 10*3/uL (ref 0.7–4.0)
MCH: 29.7 pg (ref 26.0–34.0)
MCHC: 33.2 g/dL (ref 30.0–36.0)
MCV: 89.4 fL (ref 80.0–100.0)
Monocytes Absolute: 0.6 10*3/uL (ref 0.1–1.0)
Monocytes Relative: 11 %
Neutro Abs: 3.8 10*3/uL (ref 1.7–7.7)
Neutrophils Relative %: 64 %
Platelet Count: 293 10*3/uL (ref 150–400)
RBC: 5.29 MIL/uL — ABNORMAL HIGH (ref 3.87–5.11)
RDW: 14.6 % (ref 11.5–15.5)
WBC Count: 5.9 10*3/uL (ref 4.0–10.5)
nRBC: 0 % (ref 0.0–0.2)

## 2021-10-25 LAB — CMP (CANCER CENTER ONLY)
ALT: 15 U/L (ref 0–44)
AST: 18 U/L (ref 15–41)
Albumin: 3.8 g/dL (ref 3.5–5.0)
Alkaline Phosphatase: 100 U/L (ref 38–126)
Anion gap: 6 (ref 5–15)
BUN: 14 mg/dL (ref 8–23)
CO2: 26 mmol/L (ref 22–32)
Calcium: 8.7 mg/dL — ABNORMAL LOW (ref 8.9–10.3)
Chloride: 106 mmol/L (ref 98–111)
Creatinine: 0.79 mg/dL (ref 0.44–1.00)
GFR, Estimated: >60 mL/min (ref >60)
Glucose, Bld: 124 mg/dL — ABNORMAL HIGH (ref 70–99)
Potassium: 3.9 mmol/L (ref 3.5–5.1)
Sodium: 138 mmol/L (ref 135–145)
Total Bilirubin: 0.5 mg/dL (ref 0.3–1.2)
Total Protein: 7.2 g/dL (ref 6.5–8.1)

## 2021-10-25 LAB — FERRITIN: Ferritin: 37 ng/mL (ref 11–307)

## 2021-10-25 LAB — IRON AND IRON BINDING CAPACITY (CC-WL,HP ONLY)
Iron: 80 ug/dL (ref 28–170)
Saturation Ratios: 21 % (ref 10.4–31.8)
TIBC: 388 ug/dL (ref 250–450)
UIBC: 308 ug/dL

## 2021-10-25 LAB — VITAMIN B12: Vitamin B-12: 222 pg/mL (ref 180–914)

## 2021-10-28 LAB — CEA (ACCESS): CEA (CHCC): 1.19 ng/mL (ref 0.00–5.00)

## 2021-10-29 DIAGNOSIS — Z9049 Acquired absence of other specified parts of digestive tract: Secondary | ICD-10-CM | POA: Diagnosis not present

## 2021-10-29 DIAGNOSIS — Z87440 Personal history of urinary (tract) infections: Secondary | ICD-10-CM | POA: Diagnosis not present

## 2021-10-29 DIAGNOSIS — I480 Paroxysmal atrial fibrillation: Secondary | ICD-10-CM | POA: Diagnosis not present

## 2021-10-29 DIAGNOSIS — R32 Unspecified urinary incontinence: Secondary | ICD-10-CM | POA: Diagnosis not present

## 2021-10-29 DIAGNOSIS — G454 Transient global amnesia: Secondary | ICD-10-CM | POA: Diagnosis not present

## 2021-10-29 DIAGNOSIS — D510 Vitamin B12 deficiency anemia due to intrinsic factor deficiency: Secondary | ICD-10-CM | POA: Diagnosis not present

## 2021-10-29 DIAGNOSIS — R053 Chronic cough: Secondary | ICD-10-CM | POA: Diagnosis not present

## 2021-10-29 DIAGNOSIS — I10 Essential (primary) hypertension: Secondary | ICD-10-CM | POA: Diagnosis not present

## 2021-10-29 DIAGNOSIS — Z85038 Personal history of other malignant neoplasm of large intestine: Secondary | ICD-10-CM | POA: Diagnosis not present

## 2021-10-29 DIAGNOSIS — G250 Essential tremor: Secondary | ICD-10-CM | POA: Diagnosis not present

## 2021-10-29 DIAGNOSIS — Z9181 History of falling: Secondary | ICD-10-CM | POA: Diagnosis not present

## 2021-10-29 DIAGNOSIS — D509 Iron deficiency anemia, unspecified: Secondary | ICD-10-CM | POA: Diagnosis not present

## 2021-10-29 DIAGNOSIS — I471 Supraventricular tachycardia, unspecified: Secondary | ICD-10-CM | POA: Diagnosis not present

## 2021-10-29 DIAGNOSIS — Z8701 Personal history of pneumonia (recurrent): Secondary | ICD-10-CM | POA: Diagnosis not present

## 2021-10-31 DIAGNOSIS — I10 Essential (primary) hypertension: Secondary | ICD-10-CM | POA: Diagnosis not present

## 2021-10-31 DIAGNOSIS — I480 Paroxysmal atrial fibrillation: Secondary | ICD-10-CM | POA: Diagnosis not present

## 2021-10-31 DIAGNOSIS — D510 Vitamin B12 deficiency anemia due to intrinsic factor deficiency: Secondary | ICD-10-CM | POA: Diagnosis not present

## 2021-10-31 DIAGNOSIS — R053 Chronic cough: Secondary | ICD-10-CM | POA: Diagnosis not present

## 2021-10-31 DIAGNOSIS — D509 Iron deficiency anemia, unspecified: Secondary | ICD-10-CM | POA: Diagnosis not present

## 2021-10-31 DIAGNOSIS — I471 Supraventricular tachycardia, unspecified: Secondary | ICD-10-CM | POA: Diagnosis not present

## 2021-11-01 ENCOUNTER — Other Ambulatory Visit: Payer: Self-pay | Admitting: Cardiology

## 2021-11-07 DIAGNOSIS — I480 Paroxysmal atrial fibrillation: Secondary | ICD-10-CM | POA: Diagnosis not present

## 2021-11-07 DIAGNOSIS — D509 Iron deficiency anemia, unspecified: Secondary | ICD-10-CM | POA: Diagnosis not present

## 2021-11-07 DIAGNOSIS — D510 Vitamin B12 deficiency anemia due to intrinsic factor deficiency: Secondary | ICD-10-CM | POA: Diagnosis not present

## 2021-11-07 DIAGNOSIS — R053 Chronic cough: Secondary | ICD-10-CM | POA: Diagnosis not present

## 2021-11-07 DIAGNOSIS — I10 Essential (primary) hypertension: Secondary | ICD-10-CM | POA: Diagnosis not present

## 2021-11-07 DIAGNOSIS — I471 Supraventricular tachycardia, unspecified: Secondary | ICD-10-CM | POA: Diagnosis not present

## 2021-11-08 ENCOUNTER — Inpatient Hospital Stay (HOSPITAL_BASED_OUTPATIENT_CLINIC_OR_DEPARTMENT_OTHER): Payer: MEDICARE | Admitting: Hematology

## 2021-11-08 DIAGNOSIS — D751 Secondary polycythemia: Secondary | ICD-10-CM

## 2021-11-08 DIAGNOSIS — D500 Iron deficiency anemia secondary to blood loss (chronic): Secondary | ICD-10-CM

## 2021-11-08 DIAGNOSIS — C180 Malignant neoplasm of cecum: Secondary | ICD-10-CM | POA: Diagnosis not present

## 2021-11-08 NOTE — Progress Notes (Signed)
HEMATOLOGY/ONCOLOGY CLINIC NOTE  Date of Service:  .11/08/2021  Patient Care Team: Lawerance Cruel, MD as PCP - General Minus Breeding, MD as PCP - Cardiology (Cardiology) Excell Seltzer, MD (Inactive) as Consulting Physician (General Surgery) Richmond Campbell, MD as Consulting Physician (Gastroenterology) Minus Breeding, MD as Consulting Physician (Cardiology) Brunetta Genera, MD as Consulting Physician (Hematology)  CHIEF COMPLAINTS/PURPOSE OF CONSULTATION:  Follow-up for colon cancer and iron deficiency Follow-up for elevated hemoglobin HPI  [name] [name] is a wonderful 88 y.o. female who has been referred to [place] by Dr .Harrington Challenger, Dwyane Luo, MD for evaluation and management of microcytic anemia.  Patient has a h/o HTN, atrial tachycardia, , benign tremor who was noted to have a new severe microcytic anemia and weakness with a drop of hgb down to 7.4 in sept 2017. She was admitted to the hospital and received 1 unit of PRBC. She was noted to have severe new iron deficiency and also noted to have B12 deficiency. Her stool occult blood was apparently neg. Was discharged on PO iron and was taking it for a few months. Rpt stool studies with PCP were again noted to be hemoccult neg. She received B12 IM x 1 in the hospital but has not been on any B12 since then. Patient had refused a GI workup in the hospital and was to be seen by Dr Fuller Plan for GI workup in the outpatient setting but has refused and continues to refuse and GI workup.  Denies any overt GI bleeding. No melena no hematemesis no hematochezia. No nausea/vomiting or abdominal pain. No significant weight loss recently.  Her hgb improved some on Po iron but then started dropping again and so she was referred to [place] for consideration of IV iron replacement.  On CXR in the hospital she was incidentally noted to have a moderate to large hiatal hernia.   INTERVAL HISTORY   .I connected with [name] [name] on  .11/08/2021 at  3:30 PM EDT by telephone visit and verified that I am speaking with the correct person using two identifiers.   I discussed the limitations, risks, security and privacy concerns of performing an evaluation and management service by telemedicine and the availability of in-person appointments. I also discussed with the patient that there may be a patient responsible charge related to this service. The patient expressed understanding and agreed to proceed.   Other persons participating in the visit and their role in the encounter: Patient's daughter  Patient's location: Home Provider's location: [facility]  Chief Complaint: Follow-up for labs  Patient followed to follow-up on her history of colon cancer and elevated hemoglobin levels. Since her last visit she has had issues with COVID-19 infection in August followed by pneumonia and a UTI and is recovering from these infections currently. Notes no significant change in bowel habits at this time.  No GI bleeding noted. Labs done on 10/25/2021 were discussed with her in detail.    MEDICAL HISTORY:  Past Medical History:  Diagnosis Date   Anemia    Atrial fibrillation (HCC)    Atrial tachycardia ([place])    Cancer ([place])    colon   CHICKENPOX, HX OF 09/07/2008   Qualifier: Diagnosis of  By: Asa Lente MD, Mateo Flow A    Cough    yellow to clear sputum cough for a while per pt no fever   HCAP (healthcare-associated pneumonia) 10/31/2015   History of blood transfusion last transfusion 09-29-17   HTN (hypertension)  Jaundice due to hepatitis    at age 8 or 23   Pneumonia 2014   Transient global amnesia few yrs ago x 2   AMS   Tremors of nervous system    benign familial  Large Hiatal hernia HCAP 10/2015 Iron deficiency Anemia B12 deficiency.  SURGICAL HISTORY: Past Surgical History:  Procedure Laterality Date   ABDOMINAL HERNIA REPAIR  yrs ago   BACK SURGERY     years ago lower   LAPAROSCOPIC PARTIAL  COLECTOMY N/A 11/04/2017   Procedure: LAPAROSCOPIC RIGHT HEMICOLECTOMY;  Surgeon: Excell Seltzer, MD;  Location: [facility];  Service: General;  Laterality: N/A;   TONSILLECTOMY  age 56    SOCIAL HISTORY: Social History   Socioeconomic History   Marital status: Widowed    Spouse name: Not on file   Number of children: Not on file   Years of education: Not on file   Highest education level: Not on file  Occupational History   Not on file  Tobacco Use   Smoking status: Never   Smokeless tobacco: Never   Tobacco comments:    + prior 2nd hand exposure from spouse smoking  Vaping Use   Vaping Use: Never used  Substance and Sexual Activity   Alcohol use: No    Alcohol/week: 0.0 standard drinks of alcohol   Drug use: No   Sexual activity: Never  Other Topics Concern   Not on file  Social History Narrative   Not on file   Social Determinants of Health   Financial Resource Strain: Not on file  Food Insecurity: Not on file  Transportation Needs: Not on file  Physical Activity: Not on file  Stress: Not on file  Social Connections: Not on file  Intimate Partner Violence: Not on file    FAMILY HISTORY: Family History  Problem Relation Age of Onset   Lung cancer Mother    Parkinson's disease Mother    Hypertension Mother    Other Father        blood clot   Arthritis Sister    Other Sister        spinal stenosis    ALLERGIES:  is allergic to aspirin, codeine, hydrochlorothiazide, penicillins, sulfa antibiotics, sulfonamide derivatives, biotin, levofloxacin, macrodantin [nitrofurantoin], vitamin b12, vitamin d analogs, and latex.  MEDICATIONS:  Current Outpatient Medications  Medication Sig Dispense Refill   amLODipine (NORVASC) 10 MG tablet TAKE 1 TABLET DAILY 90 tablet 3   cloNIDine (CATAPRES) 0.2 MG tablet TAKE 1 TABLET TWICE A DAY 180 tablet 3   No current facility-administered medications for this visit.    REVIEW OF SYSTEMS:   10 Point review of Systems was  done is negative except as noted above.  PHYSICAL EXAMINATION:  Telemedicine visit  LABORATORY DATA:  I have reviewed the data as listed  .    Latest Ref Rng & Units 10/25/2021    3:24 PM 08/24/2021    8:59 AM 08/23/2021    3:49 AM  CBC  WBC 4.0 - 10.5 K/uL 5.9  7.6  11.8   Hemoglobin 12.0 - 15.0 g/dL 15.7  16.2  14.6   Hematocrit 36.0 - 46.0 % 47.3  48.9  45.8   Platelets 150 - 400 K/uL 293  288  220     .    Latest Ref Rng & Units 10/25/2021    3:24 PM 08/24/2021    8:59 AM 08/23/2021    3:49 AM  CMP  Glucose 70 - 99 mg/dL 124  117  126   BUN 8 - 23 mg/dL '14  8  9   '$ Creatinine 0.44 - 1.00 mg/dL 0.79  0.64  0.66   Sodium 135 - 145 mmol/L 138  136  136   Potassium 3.5 - 5.1 mmol/L 3.9  3.7  3.3   Chloride 98 - 111 mmol/L 106  107  108   CO2 22 - 32 mmol/L '26  22  20   '$ Calcium 8.9 - 10.3 mg/dL 8.7  8.8  8.2   Total Protein 6.5 - 8.1 g/dL 7.2   6.9   Total Bilirubin 0.3 - 1.2 mg/dL 0.5   1.0   Alkaline Phos 38 - 126 U/L 100   101   AST 15 - 41 U/L 18   24   ALT 0 - 44 U/L 15   18    Lab Results  Component Value Date   FERRITIN 37 10/25/2021   B12 -- 148   Component     Latest Ref Rng & Units 05/08/2016  Parietal Cell Ab     0.0 - 20.0 Units 3.5  Intrinsic Factor Abs, Serum     0.0 - 1.1 AU/mL 0.9   11/06/17 Surgical Biopsy:     RADIOGRAPHIC STUDIES: I have personally reviewed the radiological images as listed and agreed with the findings in the report. No results found.  ASSESSMENT & PLAN:   88 y.o. caucasian female with   1) status post severe Microcytic Anemia s/p PRBc transfusion in sept 2017.  This appears to be likely related to severe iron deficiency due to cecal adenocarcinoma  2) status post severe Iron deficiency- due to chronic GI losses from cecal adenocarcinoma  Lab Results  Component Value Date   FERRITIN 37 10/25/2021   3) B12 deficiency - antiparietal cell and anti IF ab neg. B12 levels are back down to 148.  Patient with lack of  compliance with B12 replacement.  PLAN -I discussed goal is to maintain Ferritin > 100 and iron saturation at 20%.  -continue vit B complex 1 tab po daily to support accelerated hematopoiesis.  4.  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   11/04/17 Surgical pathology revealed 9.2cm invasive colorectal adenocarcinoma, clear margins, and three implicated lymph nodes in metastasis, with N1B status.    12/25/17 CT C/A/P revealed  Stable appearance of scattered small pulmonary nodules within both lungs. These remain indeterminate. Recommend continued interval follow-up to ensure stability of these lesions. 2. No mass or adenopathy identified within the chest, abdomen or Pelvis. 3. Stable right adrenal nodule which may represent a benign adenoma. 4.  Aortic Atherosclerosis (ICD10-I70.0). 5. Large hiatal hernia 6. Bilateral thyroid nodules. Nodule in the left lobe demonstrated mild increased FDG uptake on previous PET-CT. Consider further evaluation with thyroid sonogram.  Patient is status post laparoscopic right hemicolectomy by Dr. Excell Seltzer on 11/04/2017.  She had chosen to not pursue any adjuvant chemotherapy.  PLAN:  -Patient's labs done on 10/25/2021 were discussed in details  Patient's hemoglobin level is within normal limits at 15.7 with hematocrit of 47.3 normal WBC count and platelets Patient has no signs or symptoms suggestive of colon cancer recurrence at this time.  CEA levels within normal limits Ferritin 37. -Continue B12 replacement 1000 mcg p.o. daily -Continue follow-up with PCP  4)  . Patient Active Problem List  Diagnosis Date Noted   Primary hypertension    CAP (community acquired pneumonia) 08/22/2021   Educated about COVID-19 virus infection 09/28/2019   Leg swelling 01/28/2018   Bradycardia  01/28/2018   Primary cancer of cecum pT3, pN1b s/p lap colectomy 11/04/2017 11/04/2017   Chronic idiopathic constipation 04/27/2017   Iron deficiency anemia 10/20/2015   B12 deficiency anemia 10/20/2015   Symptomatic anemia 10/19/2015   Allergy 08/17/2015   UTI (urinary tract infection) 07/26/2014   TGA (transient global amnesia) 07/25/2014   Cardiac dysrhythmia 02/06/2010   PALPITATIONS 02/06/2010   Essential hypertension, benign 05/22/2008   PSVT 03/27/2008   OBESITY, UNSPECIFIED 03/26/2008   Abnormal involuntary movement 03/26/2008   AMNESIA, TRANSIENT GLOBAL 01/10/2007  -advised continued f/u with PCP for management of her other medical co-morbids   FOLLOW UP: Phone visit with Dr. Irene Limbo in 6 months Labs 1 week prior to phone visit  The total time spent in the appointment was 15 minutes*.  All of the patient's questions were answered with apparent satisfaction. The patient knows to call the clinic with any problems, questions or concerns.   Sullivan Lone MD MS AAHIVMS [facility] Hematology/Oncology Physician [facility]  .*Total Encounter Time as defined by the Centers for Medicare and Medicaid Services includes, in addition to the face-to-face time of a patient visit (documented in the note above) non-face-to-face time: obtaining and reviewing outside history, ordering and reviewing medications, tests or procedures, care coordination (communications with other health care professionals or caregivers) and documentation in the medical record.

## 2021-11-15 ENCOUNTER — Encounter: Payer: Self-pay | Admitting: Hematology

## 2021-11-20 ENCOUNTER — Ambulatory Visit: Payer: MEDICARE | Admitting: Obstetrics & Gynecology

## 2021-11-21 DIAGNOSIS — I480 Paroxysmal atrial fibrillation: Secondary | ICD-10-CM | POA: Diagnosis not present

## 2021-11-21 DIAGNOSIS — I10 Essential (primary) hypertension: Secondary | ICD-10-CM | POA: Diagnosis not present

## 2021-11-21 DIAGNOSIS — I471 Supraventricular tachycardia, unspecified: Secondary | ICD-10-CM | POA: Diagnosis not present

## 2021-11-21 DIAGNOSIS — D510 Vitamin B12 deficiency anemia due to intrinsic factor deficiency: Secondary | ICD-10-CM | POA: Diagnosis not present

## 2021-11-21 DIAGNOSIS — D509 Iron deficiency anemia, unspecified: Secondary | ICD-10-CM | POA: Diagnosis not present

## 2021-11-21 DIAGNOSIS — R053 Chronic cough: Secondary | ICD-10-CM | POA: Diagnosis not present

## 2021-11-28 DIAGNOSIS — Z9181 History of falling: Secondary | ICD-10-CM | POA: Diagnosis not present

## 2021-11-28 DIAGNOSIS — R053 Chronic cough: Secondary | ICD-10-CM | POA: Diagnosis not present

## 2021-11-28 DIAGNOSIS — I480 Paroxysmal atrial fibrillation: Secondary | ICD-10-CM | POA: Diagnosis not present

## 2021-11-28 DIAGNOSIS — Z85038 Personal history of other malignant neoplasm of large intestine: Secondary | ICD-10-CM | POA: Diagnosis not present

## 2021-11-28 DIAGNOSIS — G250 Essential tremor: Secondary | ICD-10-CM | POA: Diagnosis not present

## 2021-11-28 DIAGNOSIS — Z87440 Personal history of urinary (tract) infections: Secondary | ICD-10-CM | POA: Diagnosis not present

## 2021-11-28 DIAGNOSIS — G454 Transient global amnesia: Secondary | ICD-10-CM | POA: Diagnosis not present

## 2021-11-28 DIAGNOSIS — R32 Unspecified urinary incontinence: Secondary | ICD-10-CM | POA: Diagnosis not present

## 2021-11-28 DIAGNOSIS — D510 Vitamin B12 deficiency anemia due to intrinsic factor deficiency: Secondary | ICD-10-CM | POA: Diagnosis not present

## 2021-11-28 DIAGNOSIS — Z8701 Personal history of pneumonia (recurrent): Secondary | ICD-10-CM | POA: Diagnosis not present

## 2021-11-28 DIAGNOSIS — I10 Essential (primary) hypertension: Secondary | ICD-10-CM | POA: Diagnosis not present

## 2021-11-28 DIAGNOSIS — I471 Supraventricular tachycardia, unspecified: Secondary | ICD-10-CM | POA: Diagnosis not present

## 2021-11-28 DIAGNOSIS — D509 Iron deficiency anemia, unspecified: Secondary | ICD-10-CM | POA: Diagnosis not present

## 2021-11-28 DIAGNOSIS — Z9049 Acquired absence of other specified parts of digestive tract: Secondary | ICD-10-CM | POA: Diagnosis not present

## 2021-12-06 DIAGNOSIS — I471 Supraventricular tachycardia, unspecified: Secondary | ICD-10-CM | POA: Diagnosis not present

## 2021-12-06 DIAGNOSIS — I480 Paroxysmal atrial fibrillation: Secondary | ICD-10-CM | POA: Diagnosis not present

## 2021-12-06 DIAGNOSIS — D510 Vitamin B12 deficiency anemia due to intrinsic factor deficiency: Secondary | ICD-10-CM | POA: Diagnosis not present

## 2021-12-06 DIAGNOSIS — I10 Essential (primary) hypertension: Secondary | ICD-10-CM | POA: Diagnosis not present

## 2021-12-06 DIAGNOSIS — D509 Iron deficiency anemia, unspecified: Secondary | ICD-10-CM | POA: Diagnosis not present

## 2021-12-06 DIAGNOSIS — R053 Chronic cough: Secondary | ICD-10-CM | POA: Diagnosis not present

## 2021-12-19 DIAGNOSIS — I480 Paroxysmal atrial fibrillation: Secondary | ICD-10-CM | POA: Diagnosis not present

## 2021-12-19 DIAGNOSIS — D510 Vitamin B12 deficiency anemia due to intrinsic factor deficiency: Secondary | ICD-10-CM | POA: Diagnosis not present

## 2021-12-19 DIAGNOSIS — R053 Chronic cough: Secondary | ICD-10-CM | POA: Diagnosis not present

## 2021-12-19 DIAGNOSIS — I10 Essential (primary) hypertension: Secondary | ICD-10-CM | POA: Diagnosis not present

## 2021-12-19 DIAGNOSIS — I471 Supraventricular tachycardia, unspecified: Secondary | ICD-10-CM | POA: Diagnosis not present

## 2021-12-19 DIAGNOSIS — D509 Iron deficiency anemia, unspecified: Secondary | ICD-10-CM | POA: Diagnosis not present

## 2021-12-26 DIAGNOSIS — R053 Chronic cough: Secondary | ICD-10-CM | POA: Diagnosis not present

## 2021-12-26 DIAGNOSIS — I10 Essential (primary) hypertension: Secondary | ICD-10-CM | POA: Diagnosis not present

## 2021-12-26 DIAGNOSIS — D510 Vitamin B12 deficiency anemia due to intrinsic factor deficiency: Secondary | ICD-10-CM | POA: Diagnosis not present

## 2021-12-26 DIAGNOSIS — D509 Iron deficiency anemia, unspecified: Secondary | ICD-10-CM | POA: Diagnosis not present

## 2021-12-26 DIAGNOSIS — I480 Paroxysmal atrial fibrillation: Secondary | ICD-10-CM | POA: Diagnosis not present

## 2021-12-26 DIAGNOSIS — I471 Supraventricular tachycardia, unspecified: Secondary | ICD-10-CM | POA: Diagnosis not present

## 2022-01-23 DIAGNOSIS — M25562 Pain in left knee: Secondary | ICD-10-CM | POA: Diagnosis not present

## 2022-01-23 DIAGNOSIS — Z6834 Body mass index (BMI) 34.0-34.9, adult: Secondary | ICD-10-CM | POA: Diagnosis not present

## 2022-01-27 DIAGNOSIS — M25562 Pain in left knee: Secondary | ICD-10-CM | POA: Diagnosis not present

## 2022-01-29 DIAGNOSIS — M1712 Unilateral primary osteoarthritis, left knee: Secondary | ICD-10-CM | POA: Diagnosis not present

## 2022-02-09 DIAGNOSIS — Z85038 Personal history of other malignant neoplasm of large intestine: Secondary | ICD-10-CM | POA: Diagnosis not present

## 2022-02-09 DIAGNOSIS — I480 Paroxysmal atrial fibrillation: Secondary | ICD-10-CM | POA: Diagnosis not present

## 2022-02-09 DIAGNOSIS — M1712 Unilateral primary osteoarthritis, left knee: Secondary | ICD-10-CM | POA: Diagnosis not present

## 2022-02-09 DIAGNOSIS — Z9181 History of falling: Secondary | ICD-10-CM | POA: Diagnosis not present

## 2022-02-09 DIAGNOSIS — M25462 Effusion, left knee: Secondary | ICD-10-CM | POA: Diagnosis not present

## 2022-02-09 DIAGNOSIS — I10 Essential (primary) hypertension: Secondary | ICD-10-CM | POA: Diagnosis not present

## 2022-02-14 DIAGNOSIS — Z9181 History of falling: Secondary | ICD-10-CM | POA: Diagnosis not present

## 2022-02-14 DIAGNOSIS — I10 Essential (primary) hypertension: Secondary | ICD-10-CM | POA: Diagnosis not present

## 2022-02-14 DIAGNOSIS — M25462 Effusion, left knee: Secondary | ICD-10-CM | POA: Diagnosis not present

## 2022-02-14 DIAGNOSIS — Z85038 Personal history of other malignant neoplasm of large intestine: Secondary | ICD-10-CM | POA: Diagnosis not present

## 2022-02-14 DIAGNOSIS — M1712 Unilateral primary osteoarthritis, left knee: Secondary | ICD-10-CM | POA: Diagnosis not present

## 2022-02-14 DIAGNOSIS — I480 Paroxysmal atrial fibrillation: Secondary | ICD-10-CM | POA: Diagnosis not present

## 2022-02-17 DIAGNOSIS — I480 Paroxysmal atrial fibrillation: Secondary | ICD-10-CM | POA: Diagnosis not present

## 2022-02-17 DIAGNOSIS — M25462 Effusion, left knee: Secondary | ICD-10-CM | POA: Diagnosis not present

## 2022-02-17 DIAGNOSIS — I10 Essential (primary) hypertension: Secondary | ICD-10-CM | POA: Diagnosis not present

## 2022-02-17 DIAGNOSIS — Z9181 History of falling: Secondary | ICD-10-CM | POA: Diagnosis not present

## 2022-02-17 DIAGNOSIS — M1712 Unilateral primary osteoarthritis, left knee: Secondary | ICD-10-CM | POA: Diagnosis not present

## 2022-02-17 DIAGNOSIS — Z85038 Personal history of other malignant neoplasm of large intestine: Secondary | ICD-10-CM | POA: Diagnosis not present

## 2022-02-19 DIAGNOSIS — Z9181 History of falling: Secondary | ICD-10-CM | POA: Diagnosis not present

## 2022-02-19 DIAGNOSIS — I10 Essential (primary) hypertension: Secondary | ICD-10-CM | POA: Diagnosis not present

## 2022-02-19 DIAGNOSIS — I480 Paroxysmal atrial fibrillation: Secondary | ICD-10-CM | POA: Diagnosis not present

## 2022-02-19 DIAGNOSIS — Z85038 Personal history of other malignant neoplasm of large intestine: Secondary | ICD-10-CM | POA: Diagnosis not present

## 2022-02-19 DIAGNOSIS — M1712 Unilateral primary osteoarthritis, left knee: Secondary | ICD-10-CM | POA: Diagnosis not present

## 2022-02-19 DIAGNOSIS — M25462 Effusion, left knee: Secondary | ICD-10-CM | POA: Diagnosis not present

## 2022-02-24 DIAGNOSIS — M1712 Unilateral primary osteoarthritis, left knee: Secondary | ICD-10-CM | POA: Diagnosis not present

## 2022-02-24 DIAGNOSIS — I10 Essential (primary) hypertension: Secondary | ICD-10-CM | POA: Diagnosis not present

## 2022-02-24 DIAGNOSIS — Z85038 Personal history of other malignant neoplasm of large intestine: Secondary | ICD-10-CM | POA: Diagnosis not present

## 2022-02-24 DIAGNOSIS — Z9181 History of falling: Secondary | ICD-10-CM | POA: Diagnosis not present

## 2022-02-24 DIAGNOSIS — M25462 Effusion, left knee: Secondary | ICD-10-CM | POA: Diagnosis not present

## 2022-02-24 DIAGNOSIS — I480 Paroxysmal atrial fibrillation: Secondary | ICD-10-CM | POA: Diagnosis not present

## 2022-02-27 DIAGNOSIS — M1712 Unilateral primary osteoarthritis, left knee: Secondary | ICD-10-CM | POA: Diagnosis not present

## 2022-02-27 DIAGNOSIS — I10 Essential (primary) hypertension: Secondary | ICD-10-CM | POA: Diagnosis not present

## 2022-02-27 DIAGNOSIS — I480 Paroxysmal atrial fibrillation: Secondary | ICD-10-CM | POA: Diagnosis not present

## 2022-02-27 DIAGNOSIS — M25462 Effusion, left knee: Secondary | ICD-10-CM | POA: Diagnosis not present

## 2022-02-27 DIAGNOSIS — Z9181 History of falling: Secondary | ICD-10-CM | POA: Diagnosis not present

## 2022-02-27 DIAGNOSIS — Z85038 Personal history of other malignant neoplasm of large intestine: Secondary | ICD-10-CM | POA: Diagnosis not present

## 2022-02-28 DIAGNOSIS — M25562 Pain in left knee: Secondary | ICD-10-CM | POA: Diagnosis not present

## 2022-03-04 DIAGNOSIS — Z85038 Personal history of other malignant neoplasm of large intestine: Secondary | ICD-10-CM | POA: Diagnosis not present

## 2022-03-04 DIAGNOSIS — M1712 Unilateral primary osteoarthritis, left knee: Secondary | ICD-10-CM | POA: Diagnosis not present

## 2022-03-04 DIAGNOSIS — M25462 Effusion, left knee: Secondary | ICD-10-CM | POA: Diagnosis not present

## 2022-03-04 DIAGNOSIS — Z9181 History of falling: Secondary | ICD-10-CM | POA: Diagnosis not present

## 2022-03-04 DIAGNOSIS — I10 Essential (primary) hypertension: Secondary | ICD-10-CM | POA: Diagnosis not present

## 2022-03-04 DIAGNOSIS — I480 Paroxysmal atrial fibrillation: Secondary | ICD-10-CM | POA: Diagnosis not present

## 2022-03-06 DIAGNOSIS — M25462 Effusion, left knee: Secondary | ICD-10-CM | POA: Diagnosis not present

## 2022-03-06 DIAGNOSIS — I10 Essential (primary) hypertension: Secondary | ICD-10-CM | POA: Diagnosis not present

## 2022-03-06 DIAGNOSIS — I480 Paroxysmal atrial fibrillation: Secondary | ICD-10-CM | POA: Diagnosis not present

## 2022-03-06 DIAGNOSIS — Z85038 Personal history of other malignant neoplasm of large intestine: Secondary | ICD-10-CM | POA: Diagnosis not present

## 2022-03-06 DIAGNOSIS — Z9181 History of falling: Secondary | ICD-10-CM | POA: Diagnosis not present

## 2022-03-06 DIAGNOSIS — M1712 Unilateral primary osteoarthritis, left knee: Secondary | ICD-10-CM | POA: Diagnosis not present

## 2022-03-11 ENCOUNTER — Telehealth: Payer: Self-pay | Admitting: Hematology

## 2022-03-11 DIAGNOSIS — I480 Paroxysmal atrial fibrillation: Secondary | ICD-10-CM | POA: Diagnosis not present

## 2022-03-11 DIAGNOSIS — Z9181 History of falling: Secondary | ICD-10-CM | POA: Diagnosis not present

## 2022-03-11 DIAGNOSIS — M1712 Unilateral primary osteoarthritis, left knee: Secondary | ICD-10-CM | POA: Diagnosis not present

## 2022-03-11 DIAGNOSIS — I10 Essential (primary) hypertension: Secondary | ICD-10-CM | POA: Diagnosis not present

## 2022-03-11 DIAGNOSIS — Z85038 Personal history of other malignant neoplasm of large intestine: Secondary | ICD-10-CM | POA: Diagnosis not present

## 2022-03-11 DIAGNOSIS — M25462 Effusion, left knee: Secondary | ICD-10-CM | POA: Diagnosis not present

## 2022-03-11 NOTE — Telephone Encounter (Signed)
Called patient per provider PAL. Patient r/s and left voicemail with new appointment information.

## 2022-03-18 DIAGNOSIS — M1712 Unilateral primary osteoarthritis, left knee: Secondary | ICD-10-CM | POA: Diagnosis not present

## 2022-03-18 DIAGNOSIS — Z9181 History of falling: Secondary | ICD-10-CM | POA: Diagnosis not present

## 2022-03-18 DIAGNOSIS — I480 Paroxysmal atrial fibrillation: Secondary | ICD-10-CM | POA: Diagnosis not present

## 2022-03-18 DIAGNOSIS — M25462 Effusion, left knee: Secondary | ICD-10-CM | POA: Diagnosis not present

## 2022-03-18 DIAGNOSIS — I10 Essential (primary) hypertension: Secondary | ICD-10-CM | POA: Diagnosis not present

## 2022-03-18 DIAGNOSIS — Z85038 Personal history of other malignant neoplasm of large intestine: Secondary | ICD-10-CM | POA: Diagnosis not present

## 2022-03-25 DIAGNOSIS — I10 Essential (primary) hypertension: Secondary | ICD-10-CM | POA: Diagnosis not present

## 2022-03-25 DIAGNOSIS — Z9181 History of falling: Secondary | ICD-10-CM | POA: Diagnosis not present

## 2022-03-25 DIAGNOSIS — M25462 Effusion, left knee: Secondary | ICD-10-CM | POA: Diagnosis not present

## 2022-03-25 DIAGNOSIS — M1712 Unilateral primary osteoarthritis, left knee: Secondary | ICD-10-CM | POA: Diagnosis not present

## 2022-03-25 DIAGNOSIS — I480 Paroxysmal atrial fibrillation: Secondary | ICD-10-CM | POA: Diagnosis not present

## 2022-03-25 DIAGNOSIS — Z85038 Personal history of other malignant neoplasm of large intestine: Secondary | ICD-10-CM | POA: Diagnosis not present

## 2022-04-01 DIAGNOSIS — I480 Paroxysmal atrial fibrillation: Secondary | ICD-10-CM | POA: Diagnosis not present

## 2022-04-01 DIAGNOSIS — M1712 Unilateral primary osteoarthritis, left knee: Secondary | ICD-10-CM | POA: Diagnosis not present

## 2022-04-01 DIAGNOSIS — I10 Essential (primary) hypertension: Secondary | ICD-10-CM | POA: Diagnosis not present

## 2022-04-01 DIAGNOSIS — Z85038 Personal history of other malignant neoplasm of large intestine: Secondary | ICD-10-CM | POA: Diagnosis not present

## 2022-04-01 DIAGNOSIS — M25462 Effusion, left knee: Secondary | ICD-10-CM | POA: Diagnosis not present

## 2022-04-01 DIAGNOSIS — Z9181 History of falling: Secondary | ICD-10-CM | POA: Diagnosis not present

## 2022-04-09 DIAGNOSIS — Z85038 Personal history of other malignant neoplasm of large intestine: Secondary | ICD-10-CM | POA: Diagnosis not present

## 2022-04-09 DIAGNOSIS — I480 Paroxysmal atrial fibrillation: Secondary | ICD-10-CM | POA: Diagnosis not present

## 2022-04-09 DIAGNOSIS — I10 Essential (primary) hypertension: Secondary | ICD-10-CM | POA: Diagnosis not present

## 2022-04-09 DIAGNOSIS — M1712 Unilateral primary osteoarthritis, left knee: Secondary | ICD-10-CM | POA: Diagnosis not present

## 2022-04-09 DIAGNOSIS — M25462 Effusion, left knee: Secondary | ICD-10-CM | POA: Diagnosis not present

## 2022-04-09 DIAGNOSIS — Z9181 History of falling: Secondary | ICD-10-CM | POA: Diagnosis not present

## 2022-04-10 DIAGNOSIS — I10 Essential (primary) hypertension: Secondary | ICD-10-CM | POA: Diagnosis not present

## 2022-04-10 DIAGNOSIS — M25462 Effusion, left knee: Secondary | ICD-10-CM | POA: Diagnosis not present

## 2022-04-10 DIAGNOSIS — M1712 Unilateral primary osteoarthritis, left knee: Secondary | ICD-10-CM | POA: Diagnosis not present

## 2022-04-10 DIAGNOSIS — Z85038 Personal history of other malignant neoplasm of large intestine: Secondary | ICD-10-CM | POA: Diagnosis not present

## 2022-04-10 DIAGNOSIS — I480 Paroxysmal atrial fibrillation: Secondary | ICD-10-CM | POA: Diagnosis not present

## 2022-04-10 DIAGNOSIS — Z9181 History of falling: Secondary | ICD-10-CM | POA: Diagnosis not present

## 2022-04-14 DIAGNOSIS — I10 Essential (primary) hypertension: Secondary | ICD-10-CM | POA: Diagnosis not present

## 2022-04-14 DIAGNOSIS — Z9181 History of falling: Secondary | ICD-10-CM | POA: Diagnosis not present

## 2022-04-14 DIAGNOSIS — M25462 Effusion, left knee: Secondary | ICD-10-CM | POA: Diagnosis not present

## 2022-04-14 DIAGNOSIS — Z85038 Personal history of other malignant neoplasm of large intestine: Secondary | ICD-10-CM | POA: Diagnosis not present

## 2022-04-14 DIAGNOSIS — M1712 Unilateral primary osteoarthritis, left knee: Secondary | ICD-10-CM | POA: Diagnosis not present

## 2022-04-14 DIAGNOSIS — I480 Paroxysmal atrial fibrillation: Secondary | ICD-10-CM | POA: Diagnosis not present

## 2022-04-17 ENCOUNTER — Other Ambulatory Visit: Payer: Self-pay

## 2022-04-17 DIAGNOSIS — D500 Iron deficiency anemia secondary to blood loss (chronic): Secondary | ICD-10-CM

## 2022-04-18 ENCOUNTER — Encounter: Payer: Self-pay | Admitting: Hematology

## 2022-04-18 ENCOUNTER — Inpatient Hospital Stay: Payer: MEDICARE | Attending: Hematology

## 2022-04-18 ENCOUNTER — Other Ambulatory Visit: Payer: Self-pay

## 2022-04-18 DIAGNOSIS — D509 Iron deficiency anemia, unspecified: Secondary | ICD-10-CM | POA: Diagnosis not present

## 2022-04-18 DIAGNOSIS — D500 Iron deficiency anemia secondary to blood loss (chronic): Secondary | ICD-10-CM

## 2022-04-18 LAB — CMP (CANCER CENTER ONLY)
ALT: 11 U/L (ref 0–44)
AST: 14 U/L — ABNORMAL LOW (ref 15–41)
Albumin: 4.2 g/dL (ref 3.5–5.0)
Alkaline Phosphatase: 125 U/L (ref 38–126)
Anion gap: 10 (ref 5–15)
BUN: 11 mg/dL (ref 8–23)
CO2: 26 mmol/L (ref 22–32)
Calcium: 9.3 mg/dL (ref 8.9–10.3)
Chloride: 102 mmol/L (ref 98–111)
Creatinine: 0.71 mg/dL (ref 0.44–1.00)
GFR, Estimated: >60 mL/min (ref >60)
Glucose, Bld: 154 mg/dL — ABNORMAL HIGH (ref 70–99)
Potassium: 3.9 mmol/L (ref 3.5–5.1)
Sodium: 138 mmol/L (ref 135–145)
Total Bilirubin: 0.6 mg/dL (ref 0.3–1.2)
Total Protein: 8.1 g/dL (ref 6.5–8.1)

## 2022-04-18 LAB — CBC WITH DIFFERENTIAL (CANCER CENTER ONLY)
Abs Immature Granulocytes: 0.01 10*3/uL (ref 0.00–0.07)
Basophils Absolute: 0 10*3/uL (ref 0.0–0.1)
Basophils Relative: 0 %
Eosinophils Absolute: 0 10*3/uL (ref 0.0–0.5)
Eosinophils Relative: 1 %
HCT: 50.4 % — ABNORMAL HIGH (ref 36.0–46.0)
Hemoglobin: 16.7 g/dL — ABNORMAL HIGH (ref 12.0–15.0)
Immature Granulocytes: 0 %
Lymphocytes Relative: 18 %
Lymphs Abs: 1.2 10*3/uL (ref 0.7–4.0)
MCH: 29.8 pg (ref 26.0–34.0)
MCHC: 33.1 g/dL (ref 30.0–36.0)
MCV: 89.8 fL (ref 80.0–100.0)
Monocytes Absolute: 0.6 10*3/uL (ref 0.1–1.0)
Monocytes Relative: 9 %
Neutro Abs: 5 10*3/uL (ref 1.7–7.7)
Neutrophils Relative %: 72 %
Platelet Count: 340 10*3/uL (ref 150–400)
RBC: 5.61 MIL/uL — ABNORMAL HIGH (ref 3.87–5.11)
RDW: 14.3 % (ref 11.5–15.5)
WBC Count: 6.9 10*3/uL (ref 4.0–10.5)
nRBC: 0 % (ref 0.0–0.2)

## 2022-04-18 LAB — CEA (ACCESS): CEA (CHCC): 1.18 ng/mL (ref 0.00–5.00)

## 2022-04-18 LAB — IRON AND IRON BINDING CAPACITY (CC-WL,HP ONLY)
Iron: 78 ug/dL (ref 28–170)
Saturation Ratios: 18 % (ref 10.4–31.8)
TIBC: 427 ug/dL (ref 250–450)
UIBC: 349 ug/dL (ref 148–442)

## 2022-04-18 LAB — FERRITIN: Ferritin: 37 ng/mL (ref 11–307)

## 2022-04-18 LAB — VITAMIN B12: Vitamin B-12: 1209 pg/mL — ABNORMAL HIGH (ref 180–914)

## 2022-04-22 DIAGNOSIS — M25462 Effusion, left knee: Secondary | ICD-10-CM | POA: Diagnosis not present

## 2022-04-22 DIAGNOSIS — M1712 Unilateral primary osteoarthritis, left knee: Secondary | ICD-10-CM | POA: Diagnosis not present

## 2022-04-22 DIAGNOSIS — Z9181 History of falling: Secondary | ICD-10-CM | POA: Diagnosis not present

## 2022-04-22 DIAGNOSIS — I10 Essential (primary) hypertension: Secondary | ICD-10-CM | POA: Diagnosis not present

## 2022-04-22 DIAGNOSIS — I480 Paroxysmal atrial fibrillation: Secondary | ICD-10-CM | POA: Diagnosis not present

## 2022-04-22 DIAGNOSIS — Z85038 Personal history of other malignant neoplasm of large intestine: Secondary | ICD-10-CM | POA: Diagnosis not present

## 2022-04-25 ENCOUNTER — Inpatient Hospital Stay (HOSPITAL_BASED_OUTPATIENT_CLINIC_OR_DEPARTMENT_OTHER): Payer: MEDICARE | Admitting: Hematology

## 2022-04-25 DIAGNOSIS — D509 Iron deficiency anemia, unspecified: Secondary | ICD-10-CM | POA: Diagnosis not present

## 2022-04-25 DIAGNOSIS — C180 Malignant neoplasm of cecum: Secondary | ICD-10-CM | POA: Diagnosis not present

## 2022-04-25 DIAGNOSIS — D500 Iron deficiency anemia secondary to blood loss (chronic): Secondary | ICD-10-CM | POA: Diagnosis not present

## 2022-04-25 NOTE — Progress Notes (Signed)
HEMATOLOGY/ONCOLOGY TELEPHONE VISIT NOTE  Date of Service: 04/25/22   Patient Care Team: Lawerance Cruel, MD as PCP - General Minus Breeding, MD as PCP - Cardiology (Cardiology) Excell Seltzer, MD (Inactive) as Consulting Physician (General Surgery) Richmond Campbell, MD as Consulting Physician (Gastroenterology) Minus Breeding, MD as Consulting Physician (Cardiology) Brunetta Genera, MD as Consulting Physician (Hematology)  CHIEF COMPLAINTS/PURPOSE OF CONSULTATION:  Follow-up for colon cancer and iron deficiency Follow-up for elevated hemoglobin  HPI  [name] [name] is a wonderful 89 y.o. female who has been referred to [place] by Dr .Harrington Challenger, Dwyane Luo, MD for evaluation and management of microcytic anemia.  Patient has a h/o HTN, atrial tachycardia, , benign tremor who was noted to have a new severe microcytic anemia and weakness with a drop of hgb down to 7.4 in sept 2017. She was admitted to the hospital and received 1 unit of PRBC. She was noted to have severe new iron deficiency and also noted to have B12 deficiency. Her stool occult blood was apparently neg. Was discharged on PO iron and was taking it for a few months. Rpt stool studies with PCP were again noted to be hemoccult neg. She received B12 IM x 1 in the hospital but has not been on any B12 since then. Patient had refused a GI workup in the hospital and was to be seen by Dr Fuller Plan for GI workup in the outpatient setting but has refused and continues to refuse and GI workup.  Denies any overt GI bleeding. No melena no hematemesis no hematochezia. No nausea/vomiting or abdominal pain. No significant weight loss recently.  Her hgb improved some on Po iron but then started dropping again and so she was referred to [place] for consideration of IV iron replacement.  On CXR in the hospital she was incidentally noted to have a moderate to large hiatal hernia.   INTERVAL HISTORY   [name] [name] is a 89 y.o. female  who is being connected with for follow-up for colon cancer, iron deficiency, and elevated hemoglobin. Patient was last connected with on 11/08/2021 and noted that she was recovering from a COVID-19, pneumonia, and UTI infection.  I connected with [name] [name] on .04/25/22 at  3:30 PM EDT by telephone visit and verified that I am speaking with the correct person using two identifiers.   I discussed the limitations, risks, security and privacy concerns of performing an evaluation and management service by telemedicine and the availability of in-person appointments. I also discussed with the patient that there may be a patient responsible charge related to this service. The patient expressed understanding and agreed to proceed.   Other persons participating in the visit and their role in the encounter:Joanne  Patient's location: home  Provider's location: [facility]   Chief Complaint: follow-up for colon cancer, iron deficiency, and elevated hemoglobin.  Today, I spoke with [name] [name] for follow up of her tumor markers. She reports that she has been feeling well overall. She does report a recent knee problems due to a cyst, but her pain has since resolved. She does walk outside and engages with physical therapy regularly.  She has been eating well and her weight has been stable. She denies any major issues with her bowel movement. She denies any severe constipation, black stools, or blood in stools.  She reports that her Clonidine dose had recently been lowered and her BP has been steady. She compliantly takes B12 replacement 3 days a week.  MEDICAL HISTORY:  Past Medical History:  Diagnosis Date   Anemia    Atrial fibrillation (HCC)    Atrial tachycardia ([place])    Cancer (HCC)    colon   CHICKENPOX, HX OF 09/07/2008   Qualifier: Diagnosis of  By: Asa Lente MD, Mateo Flow A    Cough    yellow to clear sputum cough for a while per pt no fever   HCAP (healthcare-associated pneumonia) 10/31/2015    History of blood transfusion last transfusion 09-29-17   HTN (hypertension)    Jaundice due to hepatitis    at age 58 or 19   Pneumonia 2014   Transient global amnesia few yrs ago x 2   AMS   Tremors of nervous system    benign familial  Large Hiatal hernia HCAP 10/2015 Iron deficiency Anemia B12 deficiency.  SURGICAL HISTORY: Past Surgical History:  Procedure Laterality Date   ABDOMINAL HERNIA REPAIR  yrs ago   BACK SURGERY     years ago lower   LAPAROSCOPIC PARTIAL COLECTOMY N/A 11/04/2017   Procedure: LAPAROSCOPIC RIGHT HEMICOLECTOMY;  Surgeon: Excell Seltzer, MD;  Location: [facility];  Service: General;  Laterality: N/A;   TONSILLECTOMY  age 71    SOCIAL HISTORY: Social History   Socioeconomic History   Marital status: Widowed    Spouse name: Not on file   Number of children: Not on file   Years of education: Not on file   Highest education level: Not on file  Occupational History   Not on file  Tobacco Use   Smoking status: Never   Smokeless tobacco: Never   Tobacco comments:    + prior 2nd hand exposure from spouse smoking  Vaping Use   Vaping Use: Never used  Substance and Sexual Activity   Alcohol use: No    Alcohol/week: 0.0 standard drinks of alcohol   Drug use: No   Sexual activity: Never  Other Topics Concern   Not on file  Social History Narrative   Not on file   Social Determinants of Health   Financial Resource Strain: Not on file  Food Insecurity: Not on file  Transportation Needs: Not on file  Physical Activity: Not on file  Stress: Not on file  Social Connections: Not on file  Intimate Partner Violence: Not on file    FAMILY HISTORY: Family History  Problem Relation Age of Onset   Lung cancer Mother    Parkinson's disease Mother    Hypertension Mother    Other Father        blood clot   Arthritis Sister    Other Sister        spinal stenosis    ALLERGIES:  is allergic to aspirin, codeine, hydrochlorothiazide, penicillins,  sulfa antibiotics, sulfonamide derivatives, biotin, levofloxacin, macrodantin [nitrofurantoin], vitamin b12, vitamin d analogs, and latex.  MEDICATIONS:  Current Outpatient Medications  Medication Sig Dispense Refill   amLODipine (NORVASC) 10 MG tablet TAKE 1 TABLET DAILY 90 tablet 3   cloNIDine (CATAPRES) 0.2 MG tablet TAKE 1 TABLET TWICE A DAY 180 tablet 3   No current facility-administered medications for this visit.    REVIEW OF SYSTEMS:    10 Point review of Systems was done is negative except as noted above.   PHYSICAL EXAMINATION: TELEPHONE VISIT   LABORATORY DATA:  I have reviewed the data as listed  .    Latest Ref Rng & Units 04/18/2022    1:15 PM 10/25/2021    3:24 PM 08/24/2021  8:59 AM  CBC  WBC 4.0 - 10.5 K/uL 6.9  5.9  7.6   Hemoglobin 12.0 - 15.0 g/dL 16.7  15.7  16.2   Hematocrit 36.0 - 46.0 % 50.4  47.3  48.9   Platelets 150 - 400 K/uL 340  293  288     .    Latest Ref Rng & Units 04/18/2022    1:15 PM 10/25/2021    3:24 PM 08/24/2021    8:59 AM  CMP  Glucose 70 - 99 mg/dL 154  124  117   BUN 8 - 23 mg/dL 11  14  8    Creatinine 0.44 - 1.00 mg/dL 0.71  0.79  0.64   Sodium 135 - 145 mmol/L 138  138  136   Potassium 3.5 - 5.1 mmol/L 3.9  3.9  3.7   Chloride 98 - 111 mmol/L 102  106  107   CO2 22 - 32 mmol/L 26  26  22    Calcium 8.9 - 10.3 mg/dL 9.3  8.7  8.8   Total Protein 6.5 - 8.1 g/dL 8.1  7.2    Total Bilirubin 0.3 - 1.2 mg/dL 0.6  0.5    Alkaline Phos 38 - 126 U/L 125  100    AST 15 - 41 U/L 14  18    ALT 0 - 44 U/L 11  15     Lab Results  Component Value Date   FERRITIN 37 04/18/2022   B12 -- 148   Component     Latest Ref Rng & Units 05/08/2016  Parietal Cell Ab     0.0 - 20.0 Units 3.5  Intrinsic Factor Abs, Serum     0.0 - 1.1 AU/mL 0.9   11/06/17 Surgical Biopsy:     RADIOGRAPHIC STUDIES: I have personally reviewed the radiological images as listed and agreed with the findings in the report. No results found.  ASSESSMENT  & PLAN:   89 y.o. caucasian female with   1) status post severe Microcytic Anemia s/p PRBc transfusion in sept 2017.  This appears to be likely related to severe iron deficiency due to cecal adenocarcinoma  2) status post severe Iron deficiency- due to chronic GI losses from cecal adenocarcinoma  Lab Results  Component Value Date   FERRITIN 37 04/18/2022   3) B12 deficiency - antiparietal cell and anti IF ab neg. B12 levels are back down to 148.  Patient with lack of compliance with B12 replacement.  PLAN -I discussed goal is to maintain Ferritin > 100 and iron saturation at 20%.  -continue vit B complex 1 tab po daily to support accelerated hematopoiesis.  4.  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   11/04/17 Surgical pathology revealed 9.2cm invasive colorectal adenocarcinoma, clear margins, and three implicated lymph nodes in metastasis, with N1B status.    12/25/17 CT C/A/P revealed  Stable appearance of scattered small pulmonary nodules within both lungs. These remain indeterminate. Recommend continued interval follow-up to ensure stability of these lesions. 2. No mass or adenopathy identified within the chest, abdomen or Pelvis. 3. Stable right adrenal nodule which may represent a benign adenoma. 4.  Aortic Atherosclerosis (ICD10-I70.0). 5. Large hiatal hernia 6. Bilateral thyroid nodules. Nodule in the left lobe demonstrated mild increased FDG uptake on previous PET-CT. Consider further evaluation with thyroid sonogram.  Patient is  status post laparoscopic right hemicolectomy by Dr. Excell Seltzer on 11/04/2017.  She had chosen to not pursue any adjuvant chemotherapy.  PLAN:   -Discussed lab results from 04/18/2022 with patient. CBC showed WBC of 6.9K, hemoglobin of 16.7, and platelets of 340K. -no  anemia -Tumor marker unchanged since last 6 months CEA 1.18 -Patient continues to be in remission at this time -Iron levels stable, Iron saturation level normal -vitamin B12 levels normal -reasonable to lower dose of B12 replacement to 3 days a week to support accelerated hematopoiesis and to improve memory function  -repeat labs in 6 months then once a year -Patient has no signs or symptoms suggestive of colon cancer recurrence at this time -Continue follow-up with PCP  4)  . Patient Active Problem List   Diagnosis Date Noted   Primary hypertension    CAP (community acquired pneumonia) 08/22/2021   Educated about COVID-19 virus infection 09/28/2019   Leg swelling 01/28/2018   Bradycardia 01/28/2018   Primary cancer of cecum pT3, pN1b s/p lap colectomy 11/04/2017 11/04/2017   Chronic idiopathic constipation 04/27/2017   Iron deficiency anemia 10/20/2015   B12 deficiency anemia 10/20/2015   Symptomatic anemia 10/19/2015   Allergy 08/17/2015   UTI (urinary tract infection) 07/26/2014   TGA (transient global amnesia) 07/25/2014   Cardiac dysrhythmia 02/06/2010   PALPITATIONS 02/06/2010   Essential hypertension, benign 05/22/2008   PSVT 03/27/2008   OBESITY, UNSPECIFIED 03/26/2008   Abnormal involuntary movement 03/26/2008   AMNESIA, TRANSIENT GLOBAL 01/10/2007  -advised continued f/u with PCP for management of her other medical co-morbids   FOLLOW-UP: Phone visit with Dr. Irene Limbo in 6 months Labs 1 week prior to phone visit   The total time spent in the appointment was 15 minutes* .  All of the patient's questions were answered with apparent satisfaction. The patient knows to call the clinic with any problems, questions or concerns.   Sullivan Lone MD MS AAHIVMS [facility] Hematology/Oncology Physician [facility]  .*Total Encounter Time as defined by the Centers for Medicare and Medicaid Services includes, in addition to the face-to-face time of a patient visit  (documented in the note above) non-face-to-face time: obtaining and reviewing outside history, ordering and reviewing medications, tests or procedures, care coordination (communications with other health care professionals or caregivers) and documentation in the medical record.    I,Mitra Faeizi,acting as a Education administrator for Sullivan Lone, MD.,have documented all relevant documentation on the behalf of Sullivan Lone, MD,as directed by  Sullivan Lone, MD while in the presence of Sullivan Lone, MD.  .I have reviewed the above documentation for accuracy and completeness, and I agree with the above. Brunetta Genera MD

## 2022-04-28 ENCOUNTER — Telehealth: Payer: Self-pay | Admitting: Hematology

## 2022-04-28 NOTE — Telephone Encounter (Signed)
Called patient per 3/15 los notes and spoke with patient's granddaughter. Patient scheduled and will be notified.

## 2022-04-30 DIAGNOSIS — I480 Paroxysmal atrial fibrillation: Secondary | ICD-10-CM | POA: Diagnosis not present

## 2022-04-30 DIAGNOSIS — I10 Essential (primary) hypertension: Secondary | ICD-10-CM | POA: Diagnosis not present

## 2022-04-30 DIAGNOSIS — M1712 Unilateral primary osteoarthritis, left knee: Secondary | ICD-10-CM | POA: Diagnosis not present

## 2022-04-30 DIAGNOSIS — Z9181 History of falling: Secondary | ICD-10-CM | POA: Diagnosis not present

## 2022-04-30 DIAGNOSIS — Z85038 Personal history of other malignant neoplasm of large intestine: Secondary | ICD-10-CM | POA: Diagnosis not present

## 2022-04-30 DIAGNOSIS — M25462 Effusion, left knee: Secondary | ICD-10-CM | POA: Diagnosis not present

## 2022-05-02 ENCOUNTER — Other Ambulatory Visit: Payer: MEDICARE

## 2022-05-02 ENCOUNTER — Encounter: Payer: Self-pay | Admitting: Hematology

## 2022-05-06 DIAGNOSIS — Z85038 Personal history of other malignant neoplasm of large intestine: Secondary | ICD-10-CM | POA: Diagnosis not present

## 2022-05-06 DIAGNOSIS — M1712 Unilateral primary osteoarthritis, left knee: Secondary | ICD-10-CM | POA: Diagnosis not present

## 2022-05-06 DIAGNOSIS — I10 Essential (primary) hypertension: Secondary | ICD-10-CM | POA: Diagnosis not present

## 2022-05-06 DIAGNOSIS — M25462 Effusion, left knee: Secondary | ICD-10-CM | POA: Diagnosis not present

## 2022-05-06 DIAGNOSIS — Z9181 History of falling: Secondary | ICD-10-CM | POA: Diagnosis not present

## 2022-05-06 DIAGNOSIS — I480 Paroxysmal atrial fibrillation: Secondary | ICD-10-CM | POA: Diagnosis not present

## 2022-05-09 ENCOUNTER — Telehealth: Payer: MEDICARE | Admitting: Hematology

## 2022-05-10 DIAGNOSIS — Z9181 History of falling: Secondary | ICD-10-CM | POA: Diagnosis not present

## 2022-05-10 DIAGNOSIS — Z85038 Personal history of other malignant neoplasm of large intestine: Secondary | ICD-10-CM | POA: Diagnosis not present

## 2022-05-10 DIAGNOSIS — M25462 Effusion, left knee: Secondary | ICD-10-CM | POA: Diagnosis not present

## 2022-05-10 DIAGNOSIS — M1712 Unilateral primary osteoarthritis, left knee: Secondary | ICD-10-CM | POA: Diagnosis not present

## 2022-05-10 DIAGNOSIS — I480 Paroxysmal atrial fibrillation: Secondary | ICD-10-CM | POA: Diagnosis not present

## 2022-05-10 DIAGNOSIS — I10 Essential (primary) hypertension: Secondary | ICD-10-CM | POA: Diagnosis not present

## 2022-05-13 DIAGNOSIS — M1712 Unilateral primary osteoarthritis, left knee: Secondary | ICD-10-CM | POA: Diagnosis not present

## 2022-05-13 DIAGNOSIS — M25462 Effusion, left knee: Secondary | ICD-10-CM | POA: Diagnosis not present

## 2022-05-13 DIAGNOSIS — Z9181 History of falling: Secondary | ICD-10-CM | POA: Diagnosis not present

## 2022-05-13 DIAGNOSIS — I480 Paroxysmal atrial fibrillation: Secondary | ICD-10-CM | POA: Diagnosis not present

## 2022-05-13 DIAGNOSIS — Z85038 Personal history of other malignant neoplasm of large intestine: Secondary | ICD-10-CM | POA: Diagnosis not present

## 2022-05-13 DIAGNOSIS — I10 Essential (primary) hypertension: Secondary | ICD-10-CM | POA: Diagnosis not present

## 2022-05-20 DIAGNOSIS — Z85038 Personal history of other malignant neoplasm of large intestine: Secondary | ICD-10-CM | POA: Diagnosis not present

## 2022-05-20 DIAGNOSIS — Z9181 History of falling: Secondary | ICD-10-CM | POA: Diagnosis not present

## 2022-05-20 DIAGNOSIS — M25462 Effusion, left knee: Secondary | ICD-10-CM | POA: Diagnosis not present

## 2022-05-20 DIAGNOSIS — I10 Essential (primary) hypertension: Secondary | ICD-10-CM | POA: Diagnosis not present

## 2022-05-20 DIAGNOSIS — I480 Paroxysmal atrial fibrillation: Secondary | ICD-10-CM | POA: Diagnosis not present

## 2022-05-20 DIAGNOSIS — M1712 Unilateral primary osteoarthritis, left knee: Secondary | ICD-10-CM | POA: Diagnosis not present

## 2022-05-29 DIAGNOSIS — I480 Paroxysmal atrial fibrillation: Secondary | ICD-10-CM | POA: Diagnosis not present

## 2022-05-29 DIAGNOSIS — Z9181 History of falling: Secondary | ICD-10-CM | POA: Diagnosis not present

## 2022-05-29 DIAGNOSIS — M1712 Unilateral primary osteoarthritis, left knee: Secondary | ICD-10-CM | POA: Diagnosis not present

## 2022-05-29 DIAGNOSIS — Z85038 Personal history of other malignant neoplasm of large intestine: Secondary | ICD-10-CM | POA: Diagnosis not present

## 2022-05-29 DIAGNOSIS — M25462 Effusion, left knee: Secondary | ICD-10-CM | POA: Diagnosis not present

## 2022-05-29 DIAGNOSIS — I10 Essential (primary) hypertension: Secondary | ICD-10-CM | POA: Diagnosis not present

## 2022-06-04 DIAGNOSIS — M1712 Unilateral primary osteoarthritis, left knee: Secondary | ICD-10-CM | POA: Diagnosis not present

## 2022-06-04 DIAGNOSIS — I480 Paroxysmal atrial fibrillation: Secondary | ICD-10-CM | POA: Diagnosis not present

## 2022-06-04 DIAGNOSIS — Z9181 History of falling: Secondary | ICD-10-CM | POA: Diagnosis not present

## 2022-06-04 DIAGNOSIS — I10 Essential (primary) hypertension: Secondary | ICD-10-CM | POA: Diagnosis not present

## 2022-06-04 DIAGNOSIS — M25462 Effusion, left knee: Secondary | ICD-10-CM | POA: Diagnosis not present

## 2022-06-04 DIAGNOSIS — Z85038 Personal history of other malignant neoplasm of large intestine: Secondary | ICD-10-CM | POA: Diagnosis not present

## 2022-06-09 DIAGNOSIS — M1712 Unilateral primary osteoarthritis, left knee: Secondary | ICD-10-CM | POA: Diagnosis not present

## 2022-06-09 DIAGNOSIS — M25462 Effusion, left knee: Secondary | ICD-10-CM | POA: Diagnosis not present

## 2022-06-09 DIAGNOSIS — I480 Paroxysmal atrial fibrillation: Secondary | ICD-10-CM | POA: Diagnosis not present

## 2022-06-09 DIAGNOSIS — Z85038 Personal history of other malignant neoplasm of large intestine: Secondary | ICD-10-CM | POA: Diagnosis not present

## 2022-06-09 DIAGNOSIS — I10 Essential (primary) hypertension: Secondary | ICD-10-CM | POA: Diagnosis not present

## 2022-06-10 DIAGNOSIS — M1712 Unilateral primary osteoarthritis, left knee: Secondary | ICD-10-CM | POA: Diagnosis not present

## 2022-06-10 DIAGNOSIS — I10 Essential (primary) hypertension: Secondary | ICD-10-CM | POA: Diagnosis not present

## 2022-06-10 DIAGNOSIS — I480 Paroxysmal atrial fibrillation: Secondary | ICD-10-CM | POA: Diagnosis not present

## 2022-06-10 DIAGNOSIS — M25462 Effusion, left knee: Secondary | ICD-10-CM | POA: Diagnosis not present

## 2022-06-10 DIAGNOSIS — Z85038 Personal history of other malignant neoplasm of large intestine: Secondary | ICD-10-CM | POA: Diagnosis not present

## 2022-06-17 DIAGNOSIS — I480 Paroxysmal atrial fibrillation: Secondary | ICD-10-CM | POA: Diagnosis not present

## 2022-06-17 DIAGNOSIS — M1712 Unilateral primary osteoarthritis, left knee: Secondary | ICD-10-CM | POA: Diagnosis not present

## 2022-06-17 DIAGNOSIS — Z85038 Personal history of other malignant neoplasm of large intestine: Secondary | ICD-10-CM | POA: Diagnosis not present

## 2022-06-17 DIAGNOSIS — I10 Essential (primary) hypertension: Secondary | ICD-10-CM | POA: Diagnosis not present

## 2022-06-17 DIAGNOSIS — M25462 Effusion, left knee: Secondary | ICD-10-CM | POA: Diagnosis not present

## 2022-06-25 DIAGNOSIS — M1712 Unilateral primary osteoarthritis, left knee: Secondary | ICD-10-CM | POA: Diagnosis not present

## 2022-06-25 DIAGNOSIS — I480 Paroxysmal atrial fibrillation: Secondary | ICD-10-CM | POA: Diagnosis not present

## 2022-06-25 DIAGNOSIS — M25462 Effusion, left knee: Secondary | ICD-10-CM | POA: Diagnosis not present

## 2022-06-25 DIAGNOSIS — Z85038 Personal history of other malignant neoplasm of large intestine: Secondary | ICD-10-CM | POA: Diagnosis not present

## 2022-06-25 DIAGNOSIS — I10 Essential (primary) hypertension: Secondary | ICD-10-CM | POA: Diagnosis not present

## 2022-07-02 DIAGNOSIS — Z85038 Personal history of other malignant neoplasm of large intestine: Secondary | ICD-10-CM | POA: Diagnosis not present

## 2022-07-02 DIAGNOSIS — I480 Paroxysmal atrial fibrillation: Secondary | ICD-10-CM | POA: Diagnosis not present

## 2022-07-02 DIAGNOSIS — M25462 Effusion, left knee: Secondary | ICD-10-CM | POA: Diagnosis not present

## 2022-07-02 DIAGNOSIS — M1712 Unilateral primary osteoarthritis, left knee: Secondary | ICD-10-CM | POA: Diagnosis not present

## 2022-07-02 DIAGNOSIS — I10 Essential (primary) hypertension: Secondary | ICD-10-CM | POA: Diagnosis not present

## 2022-07-08 DIAGNOSIS — M25462 Effusion, left knee: Secondary | ICD-10-CM | POA: Diagnosis not present

## 2022-07-08 DIAGNOSIS — I480 Paroxysmal atrial fibrillation: Secondary | ICD-10-CM | POA: Diagnosis not present

## 2022-07-08 DIAGNOSIS — I10 Essential (primary) hypertension: Secondary | ICD-10-CM | POA: Diagnosis not present

## 2022-07-08 DIAGNOSIS — Z85038 Personal history of other malignant neoplasm of large intestine: Secondary | ICD-10-CM | POA: Diagnosis not present

## 2022-07-08 DIAGNOSIS — M1712 Unilateral primary osteoarthritis, left knee: Secondary | ICD-10-CM | POA: Diagnosis not present

## 2022-07-09 DIAGNOSIS — I480 Paroxysmal atrial fibrillation: Secondary | ICD-10-CM | POA: Diagnosis not present

## 2022-07-09 DIAGNOSIS — Z85038 Personal history of other malignant neoplasm of large intestine: Secondary | ICD-10-CM | POA: Diagnosis not present

## 2022-07-09 DIAGNOSIS — M25462 Effusion, left knee: Secondary | ICD-10-CM | POA: Diagnosis not present

## 2022-07-09 DIAGNOSIS — I10 Essential (primary) hypertension: Secondary | ICD-10-CM | POA: Diagnosis not present

## 2022-07-09 DIAGNOSIS — M1712 Unilateral primary osteoarthritis, left knee: Secondary | ICD-10-CM | POA: Diagnosis not present

## 2022-07-24 DIAGNOSIS — M25462 Effusion, left knee: Secondary | ICD-10-CM | POA: Diagnosis not present

## 2022-07-24 DIAGNOSIS — I480 Paroxysmal atrial fibrillation: Secondary | ICD-10-CM | POA: Diagnosis not present

## 2022-07-24 DIAGNOSIS — I10 Essential (primary) hypertension: Secondary | ICD-10-CM | POA: Diagnosis not present

## 2022-07-24 DIAGNOSIS — Z85038 Personal history of other malignant neoplasm of large intestine: Secondary | ICD-10-CM | POA: Diagnosis not present

## 2022-07-24 DIAGNOSIS — M1712 Unilateral primary osteoarthritis, left knee: Secondary | ICD-10-CM | POA: Diagnosis not present

## 2022-07-30 DIAGNOSIS — M1712 Unilateral primary osteoarthritis, left knee: Secondary | ICD-10-CM | POA: Diagnosis not present

## 2022-07-30 DIAGNOSIS — I480 Paroxysmal atrial fibrillation: Secondary | ICD-10-CM | POA: Diagnosis not present

## 2022-07-30 DIAGNOSIS — M25462 Effusion, left knee: Secondary | ICD-10-CM | POA: Diagnosis not present

## 2022-07-30 DIAGNOSIS — Z85038 Personal history of other malignant neoplasm of large intestine: Secondary | ICD-10-CM | POA: Diagnosis not present

## 2022-07-30 DIAGNOSIS — I10 Essential (primary) hypertension: Secondary | ICD-10-CM | POA: Diagnosis not present

## 2022-08-06 DIAGNOSIS — M1712 Unilateral primary osteoarthritis, left knee: Secondary | ICD-10-CM | POA: Diagnosis not present

## 2022-08-06 DIAGNOSIS — I480 Paroxysmal atrial fibrillation: Secondary | ICD-10-CM | POA: Diagnosis not present

## 2022-08-06 DIAGNOSIS — M25462 Effusion, left knee: Secondary | ICD-10-CM | POA: Diagnosis not present

## 2022-08-06 DIAGNOSIS — Z85038 Personal history of other malignant neoplasm of large intestine: Secondary | ICD-10-CM | POA: Diagnosis not present

## 2022-08-06 DIAGNOSIS — I10 Essential (primary) hypertension: Secondary | ICD-10-CM | POA: Diagnosis not present

## 2022-09-10 ENCOUNTER — Encounter: Payer: Self-pay | Admitting: Hematology

## 2022-10-03 ENCOUNTER — Encounter: Payer: Self-pay | Admitting: Hematology

## 2022-10-09 NOTE — Progress Notes (Deleted)
Cardiology Clinic Note   Patient Name: [name] [name] Date of Encounter: 10/09/2022  Primary Care Provider:  Daisy Floro, MD Primary Cardiologist:  Rollene Rotunda, MD  Patient Profile    89 year old female with history of hypertension, PAF, anemia, history of colon cancer and hepatitis.  Last seen by Dr. Antoine Poche on 10/01/2021.  She continued to have some baseline chronic lower extremity edema, mildly elevated blood pressure in the office but apparently normal at home.  She refused any anticoagulation due to bleeding hemorrhoids.  She did  Sweet little now thinking everybody  Past Medical History    Past Medical History:  Diagnosis Date   Anemia    Atrial fibrillation (HCC)    Atrial tachycardia (HCC)    Cancer (HCC)    colon   CHICKENPOX, HX OF 09/07/2008   Qualifier: Diagnosis of  By: Felicity Coyer MD, Vikki Ports A    Cough    yellow to clear sputum cough for a while per pt no fever   HCAP (healthcare-associated pneumonia) 10/31/2015   History of blood transfusion last transfusion 09-29-17   HTN (hypertension)    Jaundice due to hepatitis    at age 66 or 43   Pneumonia 2014   Transient global amnesia few yrs ago x 2   AMS   Tremors of nervous system    benign familial   Past Surgical History:  Procedure Laterality Date   ABDOMINAL HERNIA REPAIR  yrs ago   BACK SURGERY     years ago lower   LAPAROSCOPIC PARTIAL COLECTOMY N/A 11/04/2017   Procedure: LAPAROSCOPIC RIGHT HEMICOLECTOMY;  Surgeon: Glenna Fellows, MD;  Location: [facility];  Service: General;  Laterality: N/A;   TONSILLECTOMY  age 81    Allergies  Allergies  Allergen Reactions   Aspirin Hives   Codeine Other (See Comments)    REACTION: GI upset   Hydrochlorothiazide Rash   Penicillins Hives, Shortness Of Breath, Rash and Other (See Comments)    Has patient had a PCN reaction causing immediate rash, facial/tongue/throat swelling, SOB or lightheadedness with hypotension: yes Has patient had a PCN reaction  causing severe rash involving mucus membranes or skin necrosis: unknown Has patient had a PCN reaction that required hospitalization : unknown Has patient had a PCN reaction occurring within the last 10 years: no If all of the above answers are "NO", then may proceed with Cephalosporin use.    Sulfa Antibiotics Rash   Sulfonamide Derivatives Rash   Biotin Other (See Comments)    Mouth sores   Levofloxacin Other (See Comments)    Joint aches and weakness   Macrodantin [Nitrofurantoin] Other (See Comments)    Unknown reaction   Vitamin B12 Hives   Vitamin D Analogs Hives   Latex Rash    History of Present Illness    ***  Home Medications    Current Outpatient Medications  Medication Sig Dispense Refill   amLODipine (NORVASC) 10 MG tablet TAKE 1 TABLET DAILY 90 tablet 3   cloNIDine (CATAPRES) 0.2 MG tablet TAKE 1 TABLET TWICE A DAY 180 tablet 3   No current facility-administered medications for this visit.     Family History    Family History  Problem Relation Age of Onset   Lung cancer Mother    Parkinson's disease Mother    Hypertension Mother    Other Father        blood clot   Arthritis Sister    Other Sister  spinal stenosis   She indicated that her mother is deceased. She indicated that her father is deceased. She indicated that her sister is alive. She indicated that her maternal grandmother is deceased. She indicated that her maternal grandfather is deceased. She indicated that her paternal grandmother is deceased. She indicated that her paternal grandfather is deceased.  Social History    Social History   Socioeconomic History   Marital status: Widowed    Spouse name: Not on file   Number of children: Not on file   Years of education: Not on file   Highest education level: Not on file  Occupational History   Not on file  Tobacco Use   Smoking status: Never   Smokeless tobacco: Never   Tobacco comments:    + prior 2nd hand exposure from spouse  smoking  Vaping Use   Vaping status: Never Used  Substance and Sexual Activity   Alcohol use: No    Alcohol/week: 0.0 standard drinks of alcohol   Drug use: No   Sexual activity: Never  Other Topics Concern   Not on file  Social History Narrative   Not on file   Social Determinants of Health   Financial Resource Strain: Not on file  Food Insecurity: Not on file  Transportation Needs: Not on file  Physical Activity: Not on file  Stress: Not on file  Social Connections: Not on file  Intimate Partner Violence: Not on file     Review of Systems    General:  No chills, fever, night sweats or weight changes.  Cardiovascular:  No chest pain, dyspnea on exertion, edema, orthopnea, palpitations, paroxysmal nocturnal dyspnea. Dermatological: No rash, lesions/masses Respiratory: No cough, dyspnea Urologic: No hematuria, dysuria Abdominal:   No nausea, vomiting, diarrhea, bright red blood per rectum, melena, or hematemesis Neurologic:  No visual changes, wkns, changes in mental status. All other systems reviewed and are otherwise negative except as noted above.       Physical Exam    VS:  There were no vitals taken for this visit. , BMI There is no height or weight on file to calculate BMI.     GEN: Well nourished, well developed, in no acute distress. HEENT: normal. Neck: Supple, no JVD, carotid bruits, or masses. Cardiac: RRR, no murmurs, rubs, or gallops. No clubbing, cyanosis, edema.  Radials/DP/PT 2+ and equal bilaterally.  Respiratory:  Respirations regular and unlabored, clear to auscultation bilaterally. GI: Soft, nontender, nondistended, BS + x 4. MS: no deformity or atrophy. Skin: warm and dry, no rash. Neuro:  Strength and sensation are intact. Psych: Normal affect.      Lab Results  Component Value Date   WBC 6.9 04/18/2022   HGB 16.7 (H) 04/18/2022   HCT 50.4 (H) 04/18/2022   MCV 89.8 04/18/2022   PLT 340 04/18/2022   Lab Results  Component Value Date    CREATININE 0.71 04/18/2022   BUN 11 04/18/2022   NA 138 04/18/2022   K 3.9 04/18/2022   CL 102 04/18/2022   CO2 26 04/18/2022   Lab Results  Component Value Date   ALT 11 04/18/2022   AST 14 (L) 04/18/2022   ALKPHOS 125 04/18/2022   BILITOT 0.6 04/18/2022   Lab Results  Component Value Date   CHOL (H) 01/10/2007    236        ATP III CLASSIFICATION:  <200     mg/dL   Desirable  829-562  mg/dL   Borderline High  >=130  mg/dL   High   HDL 42 09/98/3382   LDLCALC (H) 01/10/2007    168        Total Cholesterol/HDL:CHD Risk Coronary Heart Disease Risk Table                     Men   Women  1/2 Average Risk   3.4   3.3   TRIG 132 01/10/2007   CHOLHDL 5.6 01/10/2007    No results found for: "HGBA1C"   Review of Prior Studies Echocardiogram 08/25/2022    1. Left ventricular ejection fraction, by estimation, is 55 to 60%. Left  ventricular ejection fraction by 2D MOD biplane is 57.0 %. The left  ventricle has normal function. The left ventricle has no regional wall  motion abnormalities. Left ventricular  diastolic parameters are indeterminate.   2. Right ventricular systolic function is normal. The right ventricular  size is not well visualized.   3. Left atrial size was severely dilated.   4. Right atrial size was moderately dilated.   5. The mitral valve is normal in structure. Mild mitral valve  regurgitation.   6. The aortic valve is tricuspid. Aortic valve regurgitation is not  visualized. Aortic valve sclerosis is present, with no evidence of aortic  valve stenosis.   Assessment & Plan   1.  ***     {Are you ordering a CV Procedure (e.g. stress test, cath, DCCV, TEE, etc)?   Press F2        :[id number]}   Signed, Bettey Mare. Liborio Nixon, ANP, AACC   10/09/2022 7:20 AM      Office [phone] Fax [phone]  Notice: This dictation was prepared with Dragon dictation along with smaller phrase technology. Any transcriptional errors that result from  this process are unintentional and may not be corrected upon review.

## 2022-10-10 ENCOUNTER — Ambulatory Visit: Payer: MEDICARE | Admitting: Adult Health

## 2022-10-16 ENCOUNTER — Other Ambulatory Visit: Payer: Self-pay

## 2022-10-16 DIAGNOSIS — C180 Malignant neoplasm of cecum: Secondary | ICD-10-CM

## 2022-10-17 ENCOUNTER — Inpatient Hospital Stay: Payer: MEDICARE | Attending: Hematology

## 2022-10-17 DIAGNOSIS — E538 Deficiency of other specified B group vitamins: Secondary | ICD-10-CM | POA: Diagnosis not present

## 2022-10-17 DIAGNOSIS — D509 Iron deficiency anemia, unspecified: Secondary | ICD-10-CM | POA: Diagnosis not present

## 2022-10-17 DIAGNOSIS — Z79899 Other long term (current) drug therapy: Secondary | ICD-10-CM | POA: Insufficient documentation

## 2022-10-17 DIAGNOSIS — I4719 Other supraventricular tachycardia: Secondary | ICD-10-CM | POA: Insufficient documentation

## 2022-10-17 DIAGNOSIS — K449 Diaphragmatic hernia without obstruction or gangrene: Secondary | ICD-10-CM | POA: Diagnosis not present

## 2022-10-17 DIAGNOSIS — Z85038 Personal history of other malignant neoplasm of large intestine: Secondary | ICD-10-CM | POA: Insufficient documentation

## 2022-10-17 DIAGNOSIS — I4891 Unspecified atrial fibrillation: Secondary | ICD-10-CM | POA: Insufficient documentation

## 2022-10-17 DIAGNOSIS — C180 Malignant neoplasm of cecum: Secondary | ICD-10-CM

## 2022-10-17 DIAGNOSIS — Z801 Family history of malignant neoplasm of trachea, bronchus and lung: Secondary | ICD-10-CM | POA: Diagnosis not present

## 2022-10-17 DIAGNOSIS — I10 Essential (primary) hypertension: Secondary | ICD-10-CM | POA: Diagnosis not present

## 2022-10-17 DIAGNOSIS — Z87440 Personal history of urinary (tract) infections: Secondary | ICD-10-CM | POA: Diagnosis not present

## 2022-10-17 LAB — CBC WITH DIFFERENTIAL (CANCER CENTER ONLY)
Abs Immature Granulocytes: 0.01 10*3/uL (ref 0.00–0.07)
Basophils Absolute: 0 10*3/uL (ref 0.0–0.1)
Basophils Relative: 0 %
Eosinophils Absolute: 0.1 10*3/uL (ref 0.0–0.5)
Eosinophils Relative: 1 %
HCT: 49.6 % — ABNORMAL HIGH (ref 36.0–46.0)
Hemoglobin: 16.3 g/dL — ABNORMAL HIGH (ref 12.0–15.0)
Immature Granulocytes: 0 %
Lymphocytes Relative: 14 %
Lymphs Abs: 1 10*3/uL (ref 0.7–4.0)
MCH: 29.7 pg (ref 26.0–34.0)
MCHC: 32.9 g/dL (ref 30.0–36.0)
MCV: 90.3 fL (ref 80.0–100.0)
Monocytes Absolute: 0.6 10*3/uL (ref 0.1–1.0)
Monocytes Relative: 9 %
Neutro Abs: 5.5 10*3/uL (ref 1.7–7.7)
Neutrophils Relative %: 76 %
Platelet Count: 324 10*3/uL (ref 150–400)
RBC: 5.49 MIL/uL — ABNORMAL HIGH (ref 3.87–5.11)
RDW: 13.6 % (ref 11.5–15.5)
WBC Count: 7.3 10*3/uL (ref 4.0–10.5)
nRBC: 0 % (ref 0.0–0.2)

## 2022-10-17 LAB — CMP (CANCER CENTER ONLY)
ALT: 12 U/L (ref 0–44)
AST: 15 U/L (ref 15–41)
Albumin: 4.1 g/dL (ref 3.5–5.0)
Alkaline Phosphatase: 98 U/L (ref 38–126)
Anion gap: 8 (ref 5–15)
BUN: 15 mg/dL (ref 8–23)
CO2: 27 mmol/L (ref 22–32)
Calcium: 9.3 mg/dL (ref 8.9–10.3)
Chloride: 102 mmol/L (ref 98–111)
Creatinine: 0.72 mg/dL (ref 0.44–1.00)
GFR, Estimated: >60 mL/min (ref >60)
Glucose, Bld: 169 mg/dL — ABNORMAL HIGH (ref 70–99)
Potassium: 3.9 mmol/L (ref 3.5–5.1)
Sodium: 137 mmol/L (ref 135–145)
Total Bilirubin: 0.4 mg/dL (ref 0.3–1.2)
Total Protein: 7.7 g/dL (ref 6.5–8.1)

## 2022-10-17 LAB — IRON AND IRON BINDING CAPACITY (CC-WL,HP ONLY)
Iron: 79 ug/dL (ref 28–170)
Saturation Ratios: 17 % (ref 10.4–31.8)
TIBC: 468 ug/dL — ABNORMAL HIGH (ref 250–450)
UIBC: 389 ug/dL (ref 148–442)

## 2022-10-17 LAB — VITAMIN B12: Vitamin B-12: 277 pg/mL (ref 180–914)

## 2022-10-17 LAB — FERRITIN: Ferritin: 26 ng/mL (ref 11–307)

## 2022-10-17 LAB — CEA (ACCESS): CEA (CHCC): 1.32 ng/mL (ref 0.00–5.00)

## 2022-10-24 ENCOUNTER — Inpatient Hospital Stay (HOSPITAL_BASED_OUTPATIENT_CLINIC_OR_DEPARTMENT_OTHER): Payer: MEDICARE | Admitting: Hematology

## 2022-10-24 DIAGNOSIS — D509 Iron deficiency anemia, unspecified: Secondary | ICD-10-CM | POA: Diagnosis not present

## 2022-10-24 DIAGNOSIS — D500 Iron deficiency anemia secondary to blood loss (chronic): Secondary | ICD-10-CM

## 2022-10-24 DIAGNOSIS — K449 Diaphragmatic hernia without obstruction or gangrene: Secondary | ICD-10-CM | POA: Diagnosis not present

## 2022-10-24 DIAGNOSIS — I10 Essential (primary) hypertension: Secondary | ICD-10-CM | POA: Diagnosis not present

## 2022-10-24 DIAGNOSIS — I4719 Other supraventricular tachycardia: Secondary | ICD-10-CM | POA: Diagnosis not present

## 2022-10-24 DIAGNOSIS — C180 Malignant neoplasm of cecum: Secondary | ICD-10-CM

## 2022-10-24 DIAGNOSIS — I4891 Unspecified atrial fibrillation: Secondary | ICD-10-CM | POA: Diagnosis not present

## 2022-10-24 DIAGNOSIS — E538 Deficiency of other specified B group vitamins: Secondary | ICD-10-CM | POA: Diagnosis not present

## 2022-10-24 NOTE — Progress Notes (Signed)
HEMATOLOGY/ONCOLOGY TELEPHONE VISIT NOTE  Date of Service: 10/24/22   Patient Care Team: [name] Floro, MD as PCP - General [name] Rotunda, MD as PCP - Cardiology (Cardiology) [name] Fellows, MD (Inactive) as Consulting Physician (General Surgery) [name] Ku, MD as Consulting Physician (Gastroenterology) [name] Rotunda, MD as Consulting Physician (Cardiology) [name] Maine, MD as Consulting Physician (Hematology)  CHIEF COMPLAINTS/PURPOSE OF CONSULTATION:  Follow-up for colon cancer and iron deficiency Follow-up for elevated hemoglobin  HPI  [name] [name] is a wonderful 89 y.o. female who has been referred to [place] by [name] [name], Darlen Round, MD for evaluation and management of microcytic anemia.  Patient has a h/o HTN, atrial tachycardia, , benign tremor who was noted to have a new severe microcytic anemia and weakness with a drop of hgb down to 7.4 in sept 2017. She was admitted to the hospital and received 1 unit of PRBC. She was noted to have severe new iron deficiency and also noted to have B12 deficiency. Her stool occult blood was apparently neg. Was discharged on PO iron and was taking it for a few months. Rpt stool studies with PCP were again noted to be hemoccult neg. She received B12 IM x 1 in the hospital but has not been on any B12 since then. Patient had refused a GI workup in the hospital and was to be seen by [name] [name] [name] for GI workup in the outpatient setting but has refused and continues to refuse and GI workup.  Denies any overt GI bleeding. No melena no hematemesis no hematochezia. No nausea/vomiting or abdominal pain. No significant weight loss recently.  Her hgb improved some on Po iron but then started dropping again and so she was referred to [place] for consideration of IV iron replacement.  On CXR in the hospital she was incidentally noted to have a moderate to large hiatal hernia.   INTERVAL HISTORY   [name] [name] is a 89 y.o. female  who is being connected with for follow-up for colon cancer, iron deficiency, and elevated hemoglobin. Patient was last connected with on 04/25/2022 via telemedicine visit and she reported knee issues due to a cyst which had resolved. Patient was otherwise doing well overall with no new medical concerns.   I connected with [name] [name] on 10/24/22 at  3:30 PM EDT by telephone visit and verified that I am speaking with the correct person using two identifiers.   I discussed the limitations, risks, security and privacy concerns of performing an evaluation and management service by telemedicine and the availability of in-person appointments. I also discussed with the patient that there may be a patient responsible charge related to this service. The patient expressed understanding and agreed to proceed.   Other persons participating in the visit and their role in the encounter: her daugher   Patient's location: home  Provider's location: [facility]   Chief Complaint: follow-up for colon cancer, iron deficiency, and elevated hemoglobin    Today, she reports that she has been doing well overall over the last six months. Patient's energy levels have been normal and she denies any black stools or blood in the stools.   Patient reports that that she has not been taking B12 replacement regularly due to flatulence symptoms.   MEDICAL HISTORY:  Past Medical History:  Diagnosis Date   Anemia    Atrial fibrillation (HCC)    Atrial tachycardia (HCC)    Cancer (HCC)    colon   CHICKENPOX, HX  OF 09/07/2008   Qualifier: Diagnosis of  By: Felicity Coyer MD, Vikki Ports A    Cough    yellow to clear sputum cough for a while per pt no fever   HCAP (healthcare-associated pneumonia) 10/31/2015   History of blood transfusion last transfusion 09-29-17   HTN (hypertension)    Jaundice due to hepatitis    at age 89 or 61   Pneumonia 2014   Transient global amnesia few yrs ago x 2   AMS   Tremors of nervous system     benign familial  Large Hiatal hernia HCAP 10/2015 Iron deficiency Anemia B12 deficiency.  SURGICAL HISTORY: Past Surgical History:  Procedure Laterality Date   ABDOMINAL HERNIA REPAIR  yrs ago   BACK SURGERY     years ago lower   LAPAROSCOPIC PARTIAL COLECTOMY N/A 11/04/2017   Procedure: LAPAROSCOPIC RIGHT HEMICOLECTOMY;  Surgeon: [name] Fellows, MD;  Location: [facility];  Service: General;  Laterality: N/A;   TONSILLECTOMY  age 61    SOCIAL HISTORY: Social History   Socioeconomic History   Marital status: Widowed    Spouse name: Not on file   Number of children: Not on file   Years of education: Not on file   Highest education level: Not on file  Occupational History   Not on file  Tobacco Use   Smoking status: Never   Smokeless tobacco: Never   Tobacco comments:    + prior 2nd hand exposure from spouse smoking  Vaping Use   Vaping status: Never Used  Substance and Sexual Activity   Alcohol use: No    Alcohol/week: 0.0 standard drinks of alcohol   Drug use: No   Sexual activity: Never  Other Topics Concern   Not on file  Social History Narrative   Not on file   Social Determinants of Health   Financial Resource Strain: Not on file  Food Insecurity: Not on file  Transportation Needs: Not on file  Physical Activity: Not on file  Stress: Not on file  Social Connections: Not on file  Intimate Partner Violence: Not on file    FAMILY HISTORY: Family History  Problem Relation Age of Onset   Lung cancer Mother    Parkinson's disease Mother    Hypertension Mother    Other Father        blood clot   Arthritis Sister    Other Sister        spinal stenosis    ALLERGIES:  is allergic to aspirin, codeine, hydrochlorothiazide, penicillins, sulfa antibiotics, sulfonamide derivatives, biotin, levofloxacin, macrodantin [nitrofurantoin], vitamin b12, vitamin d analogs, and latex.  MEDICATIONS:  Current Outpatient Medications  Medication Sig Dispense Refill    amLODipine (NORVASC) 10 MG tablet TAKE 1 TABLET DAILY 90 tablet 3   cloNIDine (CATAPRES) 0.2 MG tablet TAKE 1 TABLET TWICE A DAY 180 tablet 3   No current facility-administered medications for this visit.    REVIEW OF SYSTEMS:    10 Point review of Systems was done is negative except as noted above.   PHYSICAL EXAMINATION: TELEPHONE VISIT   LABORATORY DATA:  I have reviewed the data as listed  .    Latest Ref Rng & Units 10/17/2022    2:15 PM 04/18/2022    1:15 PM 10/25/2021    3:24 PM  CBC  WBC 4.0 - 10.5 K/uL 7.3  6.9  5.9   Hemoglobin 12.0 - 15.0 g/dL 16.1  09.6  04.5   Hematocrit 36.0 - 46.0 % 49.6  50.4  47.3   Platelets 150 - 400 K/uL 324  340  293     .    Latest Ref Rng & Units 10/17/2022    2:15 PM 04/18/2022    1:15 PM 10/25/2021    3:24 PM  CMP  Glucose 70 - 99 mg/dL 093  235  573   BUN 8 - 23 mg/dL 15  11  14    Creatinine 0.44 - 1.00 mg/dL 2.20  2.54  2.70   Sodium 135 - 145 mmol/L 137  138  138   Potassium 3.5 - 5.1 mmol/L 3.9  3.9  3.9   Chloride 98 - 111 mmol/L 102  102  106   CO2 22 - 32 mmol/L 27  26  26    Calcium 8.9 - 10.3 mg/dL 9.3  9.3  8.7   Total Protein 6.5 - 8.1 g/dL 7.7  8.1  7.2   Total Bilirubin 0.3 - 1.2 mg/dL 0.4  0.6  0.5   Alkaline Phos 38 - 126 U/L 98  125  100   AST 15 - 41 U/L 15  14  18    ALT 0 - 44 U/L 12  11  15     Lab Results  Component Value Date   FERRITIN 26 10/17/2022   B12 -- 148   Component     Latest Ref Rng & Units 05/08/2016  Parietal Cell Ab     0.0 - 20.0 Units 3.5  Intrinsic Factor Abs, Serum     0.0 - 1.1 AU/mL 0.9   11/06/17 Surgical Biopsy:     RADIOGRAPHIC STUDIES: I have personally reviewed the radiological images as listed and agreed with the findings in the report. No results found.  ASSESSMENT & PLAN:   89 y.o. caucasian female with   1) status post severe Microcytic Anemia s/p PRBc transfusion in sept 2017.  This appears to be likely related to severe iron deficiency due to cecal  adenocarcinoma  2) status post severe Iron deficiency- due to chronic GI losses from cecal adenocarcinoma  Lab Results  Component Value Date   FERRITIN 26 10/17/2022   3) B12 deficiency - antiparietal cell and anti IF ab neg. B12 levels are back down to 148.  Patient with lack of compliance with B12 replacement.  PLAN -I discussed goal is to maintain Ferritin > 100 and iron saturation at 20%.  -continue vit B complex 1 tab po daily to support accelerated hematopoiesis.  4.  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   11/04/17 Surgical pathology revealed 9.2cm invasive colorectal adenocarcinoma, clear margins, and three implicated lymph nodes in metastasis, with N1B status.    12/25/17 CT C/A/P revealed  Stable appearance of scattered small pulmonary nodules within both lungs. These remain indeterminate. Recommend continued interval follow-up to ensure stability of these lesions. 2. No mass or adenopathy identified within the chest, abdomen or Pelvis. 3. Stable right adrenal nodule which may represent a benign adenoma. 4.  Aortic Atherosclerosis (ICD10-I70.0). 5. Large hiatal hernia 6. Bilateral thyroid nodules. Nodule in the left lobe demonstrated mild increased FDG uptake on previous PET-CT. Consider further evaluation with thyroid sonogram.  Patient is status post laparoscopic right hemicolectomy by [name]. Johna Sheriff on 11/04/2017.  She had chosen to not pursue any adjuvant chemotherapy.  PLAN:   -Discussed lab results from 10/17/2022 in detail  with patient. CBC normal, showed WBC of 7.3K, hemoglobin of 16.3, and platelets of 324K. -CEA normal  -Patient has no signs or symptoms suggestive of colon cancer recurrence at this time  -ferritin level is 26 ng/mL, ferritin levels were previously 37 ng/mL 6 months  ago -iron saturation level is 17% -no significant need for iron replacement with IV iron at this time -recommend iron rich foods with plenty of vegetables -Patient's vitamin B12 levels are currently low, likely because patient has not been taking B12 replacement regulalry 3 days a week as prescribed. -continue vitamin B12 at least 3 days a week to optimize energy levels and memory function -advised patient to take B12 replacement after consuming food and avoid taking it on empty stomach. Discussed option of B12 replacement in sublingual form. -answered all of patient's questions regarding recent lab workup  -advised patient to stay well hydrated -will continue to monitor with labs every 6 months   4)  . Patient Active Problem List   Diagnosis Date Noted   Primary hypertension    CAP (community acquired pneumonia) 08/22/2021   Educated about COVID-19 virus infection 09/28/2019   Leg swelling 01/28/2018   Bradycardia 01/28/2018   Primary cancer of cecum pT3, pN1b s/p lap colectomy 11/04/2017 11/04/2017   Chronic idiopathic constipation 04/27/2017   Iron deficiency anemia 10/20/2015   B12 deficiency anemia 10/20/2015   Symptomatic anemia 10/19/2015   Allergy 08/17/2015   UTI (urinary tract infection) 07/26/2014   TGA (transient global amnesia) 07/25/2014   Cardiac dysrhythmia 02/06/2010   PALPITATIONS 02/06/2010   Essential hypertension, benign 05/22/2008   PSVT 03/27/2008   OBESITY, UNSPECIFIED 03/26/2008   Abnormal involuntary movement 03/26/2008   AMNESIA, TRANSIENT GLOBAL 01/10/2007  -advised continued f/u with PCP for management of her other medical co-morbids   FOLLOW-UP: Phone visit with [name]. Candise Che in 6 months Labs 1 week prior to phone visit.  The total time spent in the appointment was 20 minutes* .  All of the patient's questions were answered with apparent satisfaction. The patient knows to call the clinic with any problems, questions or concerns.   Wyvonnia Lora MD  MS AAHIVMS [facility] Hematology/Oncology Physician [facility]  .*Total Encounter Time as defined by the Centers for Medicare and Medicaid Services includes, in addition to the face-to-face time of a patient visit (documented in the note above) non-face-to-face time: obtaining and reviewing outside history, ordering and reviewing medications, tests or procedures, care coordination (communications with other health care professionals or caregivers) and documentation in the medical record.    I,Mitra Faeizi,acting as a Neurosurgeon for Wyvonnia Lora, MD.,have documented all relevant documentation on the behalf of Wyvonnia Lora, MD,as directed by  Wyvonnia Lora, MD while in the presence of Wyvonnia Lora, MD.  .I have reviewed the above documentation for accuracy and completeness, and I agree with the above. [name] Maine MD

## 2022-10-28 ENCOUNTER — Encounter: Payer: Self-pay | Admitting: Hematology

## 2022-10-30 ENCOUNTER — Encounter: Payer: Self-pay | Admitting: Hematology

## 2022-11-08 ENCOUNTER — Telehealth: Payer: Self-pay | Admitting: Hematology

## 2022-11-11 ENCOUNTER — Encounter: Payer: Self-pay | Admitting: Cardiology

## 2022-11-11 MED ORDER — AMLODIPINE BESYLATE 10 MG PO TABS: 10.0000 mg | ORAL_TABLET | Freq: Every day | ORAL | 0 refills | Status: DC

## 2022-11-11 MED ORDER — CLONIDINE HCL 0.2 MG PO TABS: 0.2000 mg | ORAL_TABLET | Freq: Two times a day (BID) | ORAL | 0 refills | Status: DC

## 2022-11-28 NOTE — Progress Notes (Unsigned)
Cardiology Office Note:  .   Date:  12/01/2022  ID:  [name] [name], DOB 1927/07/26, MRN [id number] PCP: Daisy Floro, MD  [facility] Providers Cardiologist:  Rollene Rotunda MD }   History of Present Illness: .   [name] [name] is a 89 y.o. female we are following for ongoing assessment and management of hypertension, atrial fibrillation.  Patient refuses anticoagulation therapy.  Nor does she wish to be considered for Watchman device.  Last seen in the office by Dr. Antoine Poche on 10/01/2021.  [name] [name] comes for annual follow-up and medication refills.  She is currently on reduced dose of amlodipine from 10 mg daily to 5 mg daily and reduced dose of clonidine from 0.2 mg twice a day to 0.1 mg twice a day due to hypotension.  She is tolerating this medication well.  She refuses aspirin or any other additional medications at this time and is adamant about this.  ROS: Denies chest pain, does have mild shortness of breath with ambulation using a walker, no dizziness, lower extremity edema, or near syncope.  Otherwise negative.  Studies Reviewed: Marland Kitchen   EKG Interpretation Date/Time:  Monday December 01 2022 15:33:23 EDT Ventricular Rate:  94 PR Interval:    QRS Duration:  72 QT Interval:  356 QTC Calculation: 445 R Axis:   102  Text Interpretation: Atrial fibrillation Rightward axis Septal infarct , age undetermined ST & T wave abnormality, consider inferior ischemia When compared with ECG of 22-Aug-2021 17:52, PREVIOUS ECG IS PRESENT Confirmed by Joni Reining [phone]) on 12/01/2022 4:58:17 PM      Physical Exam:   VS:  BP (!) 140/85 (BP Location: Left Arm, Patient Position: Sitting, Cuff Size: Normal)   Pulse 94   Ht 5\' 3"  (1.6 m)   Wt 195 lb 9.6 oz (88.7 kg)   SpO2 95%   BMI 34.65 kg/m    Wt Readings from Last 3 Encounters:  12/01/22 195 lb 9.6 oz (88.7 kg)  10/01/21 181 lb 3.2 oz (82.2 kg)  05/27/21 195 lb 1.6 oz (88.5 kg)    GEN: Well nourished, well developed  in no acute distress essential tremor, sitting in a wheelchair. NECK: No JVD; No carotid bruits CARDIAC: IRRR, soft systolic murmurs, rubs, gallops RESPIRATORY:  Clear to auscultation without rales, wheezing or rhonchi  ABDOMEN: Soft, non-tender, non-distended EXTREMITIES: Mild dependent edema; No deformity   ASSESSMENT AND PLAN: .    Permanent atrial fibrillation: Heart rate is currently well-controlled.  The patient adamantly refuses aspirin and/or anticoagulation therapy.  She is aware of the risks.  No change in therapy.  2.  Hypertension: She has been reduced on her medications and therefore I will send new prescription to make it easier on her daughter who is providing her medications so she will not have to cut them in half.  New Rx for amlodipine 5 mg daily and new Rx for clonidine 0.1 mg twice a day as provided.  She prefers 90-day supply which is ordered.   Signed, Bettey Mare. Liborio Nixon, ANP, AACC

## 2022-12-01 ENCOUNTER — Ambulatory Visit: Payer: MEDICARE | Attending: Adult Health | Admitting: Adult Health

## 2022-12-01 ENCOUNTER — Encounter: Payer: Self-pay | Admitting: Adult Health

## 2022-12-01 VITALS — BP 140/85 | HR 94 | Ht 63.0 in | Wt 195.6 lb

## 2022-12-01 DIAGNOSIS — I10 Essential (primary) hypertension: Secondary | ICD-10-CM | POA: Diagnosis not present

## 2022-12-01 MED ORDER — CLONIDINE HCL 0.1 MG PO TABS: 0.1000 mg | ORAL_TABLET | Freq: Two times a day (BID) | ORAL | 11 refills | Status: DC

## 2022-12-01 MED ORDER — AMLODIPINE BESYLATE 5 MG PO TABS
5.0000 mg | ORAL_TABLET | Freq: Every day | ORAL | 3 refills | Status: DC
Start: 2022-12-01 — End: 2023-10-09

## 2022-12-01 NOTE — Patient Instructions (Signed)
Medication Instructions:  No changes *If you need a refill on your cardiac medications before your next appointment, please call your pharmacy*   Lab Work: No labs If you have labs (blood work) drawn today and your tests are completely normal, you will receive your results only by: MyChart Message (if you have MyChart) OR A paper copy in the mail If you have any lab test that is abnormal or we need to change your treatment, we will call you to review the results.   Testing/Procedures: No Testing   Follow-Up: At [facility], you and your health needs are our priority.  As part of our continuing mission to provide you with exceptional heart care, we have created designated Provider Care Teams.  These Care Teams include your primary Cardiologist (physician) and Advanced Practice Providers (APPs -  Physician Assistants and Nurse Practitioners) who all work together to provide you with the care you need, when you need it.  We recommend signing up for the patient portal called "MyChart".  Sign up information is provided on this After Visit Summary.  MyChart is used to connect with patients for Virtual Visits (Telemedicine).  Patients are able to view lab/test results, encounter notes, upcoming appointments, etc.  Non-urgent messages can be sent to your provider as well.   To learn more about what you can do with MyChart, go to [email or web address].    Your next appointment:   1 year(s)  Provider:   Rollene Rotunda, MD

## 2023-04-23 ENCOUNTER — Other Ambulatory Visit: Payer: Self-pay

## 2023-04-23 DIAGNOSIS — D500 Iron deficiency anemia secondary to blood loss (chronic): Secondary | ICD-10-CM

## 2023-04-23 DIAGNOSIS — C180 Malignant neoplasm of cecum: Secondary | ICD-10-CM

## 2023-04-24 ENCOUNTER — Inpatient Hospital Stay: Payer: MEDICARE | Attending: Hematology

## 2023-04-24 DIAGNOSIS — I10 Essential (primary) hypertension: Secondary | ICD-10-CM | POA: Insufficient documentation

## 2023-04-24 DIAGNOSIS — K449 Diaphragmatic hernia without obstruction or gangrene: Secondary | ICD-10-CM | POA: Insufficient documentation

## 2023-04-24 DIAGNOSIS — Z801 Family history of malignant neoplasm of trachea, bronchus and lung: Secondary | ICD-10-CM | POA: Diagnosis not present

## 2023-04-24 DIAGNOSIS — C160 Malignant neoplasm of cardia: Secondary | ICD-10-CM | POA: Diagnosis not present

## 2023-04-24 DIAGNOSIS — Z87440 Personal history of urinary (tract) infections: Secondary | ICD-10-CM | POA: Diagnosis not present

## 2023-04-24 DIAGNOSIS — D509 Iron deficiency anemia, unspecified: Secondary | ICD-10-CM | POA: Diagnosis not present

## 2023-04-24 DIAGNOSIS — E278 Other specified disorders of adrenal gland: Secondary | ICD-10-CM | POA: Diagnosis not present

## 2023-04-24 DIAGNOSIS — I4891 Unspecified atrial fibrillation: Secondary | ICD-10-CM | POA: Insufficient documentation

## 2023-04-24 DIAGNOSIS — I700 Atherosclerosis of aorta: Secondary | ICD-10-CM | POA: Diagnosis not present

## 2023-04-24 DIAGNOSIS — R918 Other nonspecific abnormal finding of lung field: Secondary | ICD-10-CM | POA: Diagnosis not present

## 2023-04-24 DIAGNOSIS — K5900 Constipation, unspecified: Secondary | ICD-10-CM | POA: Diagnosis not present

## 2023-04-24 DIAGNOSIS — R001 Bradycardia, unspecified: Secondary | ICD-10-CM | POA: Insufficient documentation

## 2023-04-24 DIAGNOSIS — C180 Malignant neoplasm of cecum: Secondary | ICD-10-CM

## 2023-04-24 DIAGNOSIS — D500 Iron deficiency anemia secondary to blood loss (chronic): Secondary | ICD-10-CM

## 2023-04-24 DIAGNOSIS — Z800 Family history of malignant neoplasm of digestive organs: Secondary | ICD-10-CM | POA: Insufficient documentation

## 2023-04-24 DIAGNOSIS — E538 Deficiency of other specified B group vitamins: Secondary | ICD-10-CM | POA: Insufficient documentation

## 2023-04-24 LAB — CMP (CANCER CENTER ONLY)
ALT: 12 U/L (ref 0–44)
AST: 15 U/L (ref 15–41)
Albumin: 4.3 g/dL (ref 3.5–5.0)
Alkaline Phosphatase: 102 U/L (ref 38–126)
Anion gap: 11 (ref 5–15)
BUN: 11 mg/dL (ref 8–23)
CO2: 26 mmol/L (ref 22–32)
Calcium: 9.1 mg/dL (ref 8.9–10.3)
Chloride: 102 mmol/L (ref 98–111)
Creatinine: 0.7 mg/dL (ref 0.44–1.00)
GFR, Estimated: >60 mL/min (ref >60)
Glucose, Bld: 165 mg/dL — ABNORMAL HIGH (ref 70–99)
Potassium: 4 mmol/L (ref 3.5–5.1)
Sodium: 139 mmol/L (ref 135–145)
Total Bilirubin: 0.5 mg/dL (ref 0.0–1.2)
Total Protein: 7.8 g/dL (ref 6.5–8.1)

## 2023-04-24 LAB — CBC WITH DIFFERENTIAL (CANCER CENTER ONLY)
Abs Immature Granulocytes: 0.02 10*3/uL (ref 0.00–0.07)
Basophils Absolute: 0 10*3/uL (ref 0.0–0.1)
Basophils Relative: 1 %
Eosinophils Absolute: 0.1 10*3/uL (ref 0.0–0.5)
Eosinophils Relative: 1 %
HCT: 52.4 % — ABNORMAL HIGH (ref 36.0–46.0)
Hemoglobin: 17.1 g/dL — ABNORMAL HIGH (ref 12.0–15.0)
Immature Granulocytes: 0 %
Lymphocytes Relative: 20 %
Lymphs Abs: 1.3 10*3/uL (ref 0.7–4.0)
MCH: 29.7 pg (ref 26.0–34.0)
MCHC: 32.6 g/dL (ref 30.0–36.0)
MCV: 91.1 fL (ref 80.0–100.0)
Monocytes Absolute: 0.6 10*3/uL (ref 0.1–1.0)
Monocytes Relative: 9 %
Neutro Abs: 4.6 10*3/uL (ref 1.7–7.7)
Neutrophils Relative %: 69 %
Platelet Count: 343 10*3/uL (ref 150–400)
RBC: 5.75 MIL/uL — ABNORMAL HIGH (ref 3.87–5.11)
RDW: 13.8 % (ref 11.5–15.5)
WBC Count: 6.5 10*3/uL (ref 4.0–10.5)
nRBC: 0 % (ref 0.0–0.2)

## 2023-04-24 LAB — FERRITIN: Ferritin: 39 ng/mL (ref 11–307)

## 2023-04-24 LAB — CEA (ACCESS): CEA (CHCC): 1.31 ng/mL (ref 0.00–5.00)

## 2023-04-24 LAB — VITAMIN B12: Vitamin B-12: 200 pg/mL (ref 180–914)

## 2023-04-24 LAB — IRON AND IRON BINDING CAPACITY (CC-WL,HP ONLY)
Iron: 100 ug/dL (ref 28–170)
Saturation Ratios: 24 % (ref 10.4–31.8)
TIBC: 420 ug/dL (ref 250–450)
UIBC: 320 ug/dL (ref 148–442)

## 2023-04-26 ENCOUNTER — Encounter: Payer: Self-pay | Admitting: Hematology

## 2023-05-01 ENCOUNTER — Inpatient Hospital Stay (HOSPITAL_BASED_OUTPATIENT_CLINIC_OR_DEPARTMENT_OTHER): Payer: MEDICARE | Admitting: Hematology

## 2023-05-01 DIAGNOSIS — E538 Deficiency of other specified B group vitamins: Secondary | ICD-10-CM

## 2023-05-01 DIAGNOSIS — C180 Malignant neoplasm of cecum: Secondary | ICD-10-CM

## 2023-05-01 DIAGNOSIS — R001 Bradycardia, unspecified: Secondary | ICD-10-CM | POA: Diagnosis not present

## 2023-05-01 DIAGNOSIS — D509 Iron deficiency anemia, unspecified: Secondary | ICD-10-CM | POA: Diagnosis not present

## 2023-05-01 DIAGNOSIS — D751 Secondary polycythemia: Secondary | ICD-10-CM

## 2023-05-01 DIAGNOSIS — I4891 Unspecified atrial fibrillation: Secondary | ICD-10-CM | POA: Diagnosis not present

## 2023-05-01 DIAGNOSIS — K5900 Constipation, unspecified: Secondary | ICD-10-CM | POA: Diagnosis not present

## 2023-05-01 DIAGNOSIS — C160 Malignant neoplasm of cardia: Secondary | ICD-10-CM | POA: Diagnosis not present

## 2023-05-01 NOTE — Progress Notes (Signed)
 HEMATOLOGY/ONCOLOGY TELEPHONE VISIT NOTE  Date of Service: 05/01/23   Patient Care Team: Daisy Floro, MD as PCP - General Rollene Rotunda, MD as PCP - Cardiology (Cardiology) Glenna Fellows, MD (Inactive) as Consulting Physician (General Surgery) Sharrell Ku, MD as Consulting Physician (Gastroenterology) Rollene Rotunda, MD as Consulting Physician (Cardiology) Johney Maine, MD as Consulting Physician (Hematology)  CHIEF COMPLAINTS/PURPOSE OF CONSULTATION:  Follow-up for colon cancer and iron deficiency Follow-up for elevated hemoglobin  HPI  [name] [name] is a wonderful 89 y.o. female who has been referred to [place] by Dr .Tenny Craw, Darlen Round, MD for evaluation and management of microcytic anemia.  Patient has a h/o HTN, atrial tachycardia, , benign tremor who was noted to have a new severe microcytic anemia and weakness with a drop of hgb down to 7.4 in sept 2017. She was admitted to the hospital and received 1 unit of PRBC. She was noted to have severe new iron deficiency and also noted to have B12 deficiency. Her stool occult blood was apparently neg. Was discharged on PO iron and was taking it for a few months. Rpt stool studies with PCP were again noted to be hemoccult neg. She received B12 IM x 1 in the hospital but has not been on any B12 since then. Patient had refused a GI workup in the hospital and was to be seen by Dr Russella Dar for GI workup in the outpatient setting but has refused and continues to refuse and GI workup.  Denies any overt GI bleeding. No melena no hematemesis no hematochezia. No nausea/vomiting or abdominal pain. No significant weight loss recently.  Her hgb improved some on Po iron but then started dropping again and so she was referred to [place] for consideration of IV iron replacement.  On CXR in the hospital she was incidentally noted to have a moderate to large hiatal hernia.   INTERVAL HISTORY   [name] [name] is a 89 y.o. female  who is being connected with for follow-up for colon cancer, iron deficiency, and elevated hemoglobin.I last had a phone visit with patient on 10/24/2022 and reported flatulence symptoms with vitamin B12 intake.   I connected with [name] [name] on 05/01/23 at  3:30 PM EDT by telephone visit and verified that I am speaking with the correct person using two identifiers.   I discussed the limitations, risks, security and privacy concerns of performing an evaluation and management service by telemedicine and the availability of in-person appointments. I also discussed with the patient that there may be a patient responsible charge related to this service. The patient expressed understanding and agreed to proceed.   Other persons participating in the visit and their role in the encounter: her daughter   Patient's location: home  Provider's location: [facility]   Chief Complaint: follow-up for colon cancer, iron deficiency, and elevated hemoglobin   Today, she reports that she has been doing well overall over the last 6 months. She has been eating well and denies any weight loss.   She reports constipation sometimes in the mornings. Patient generally drinks almost 10 ounces of water daily.   Patient reports that she stopped taking vitamin B12 due to causing stomach cramping.   Patient denies any new abdominal pain.   She reports that she has been staying fairly active by walking daily and engaging in leg exercises.   She reports some concern over mildly higher glucose levels on recent lab work.    The results of  her recent lab workup was discussed with her in detail.   MEDICAL HISTORY:  Past Medical History:  Diagnosis Date   Anemia    Atrial fibrillation (HCC)    Atrial tachycardia (HCC)    Cancer (HCC)    colon   CHICKEN[place], HX OF 09/07/2008   Qualifier: Diagnosis of  By: Felicity Coyer MD, Vikki Ports A    Cough    yellow to clear sputum cough for a while per pt no fever   HCAP  (healthcare-associated pneumonia) 10/31/2015   History of blood transfusion last transfusion 09-29-17   HTN (hypertension)    Jaundice due to hepatitis    at age 66 or 32   Pneumonia 2014   Transient global amnesia few yrs ago x 2   AMS   Tremors of nervous system    benign familial  Large Hiatal hernia HCAP 10/2015 Iron deficiency Anemia B12 deficiency.  SURGICAL HISTORY: Past Surgical History:  Procedure Laterality Date   ABDOMINAL HERNIA REPAIR  yrs ago   BACK SURGERY     years ago lower   LAPAROSCOPIC PARTIAL COLECTOMY N/A 11/04/2017   Procedure: LAPAROSCOPIC RIGHT HEMICOLECTOMY;  Surgeon: Glenna Fellows, MD;  Location: [facility];  Service: General;  Laterality: N/A;   TONSILLECTOMY  age 65    SOCIAL HISTORY: Social History   Socioeconomic History   Marital status: Widowed    Spouse name: Not on file   Number of children: Not on file   Years of education: Not on file   Highest education level: Not on file  Occupational History   Not on file  Tobacco Use   Smoking status: Never   Smokeless tobacco: Never   Tobacco comments:    + prior 2nd hand exposure from spouse smoking  Vaping Use   Vaping status: Never Used  Substance and Sexual Activity   Alcohol use: No    Alcohol/week: 0.0 standard drinks of alcohol   Drug use: No   Sexual activity: Never  Other Topics Concern   Not on file  Social History Narrative   Not on file   Social Drivers of Health   Financial Resource Strain: Not on file  Food Insecurity: Not on file  Transportation Needs: Not on file  Physical Activity: Not on file  Stress: Not on file  Social Connections: Not on file  Intimate Partner Violence: Not on file    FAMILY HISTORY: Family History  Problem Relation Age of Onset   Lung cancer Mother    Parkinson's disease Mother    Hypertension Mother    Other Father        blood clot   Arthritis Sister    Other Sister        spinal stenosis    ALLERGIES:  is allergic to aspirin,  codeine, hydrochlorothiazide, penicillins, sulfa antibiotics, sulfonamide derivatives, biotin, levofloxacin, macrodantin [nitrofurantoin], vitamin b12, vitamin d analogs, and latex.  MEDICATIONS:  Current Outpatient Medications  Medication Sig Dispense Refill   amLODipine (NORVASC) 5 MG tablet Take 1 tablet (5 mg total) by mouth daily. 90 tablet 3   cloNIDine (CATAPRES) 0.1 MG tablet Take 1 tablet (0.1 mg total) by mouth 2 (two) times daily. 60 tablet 11   No current facility-administered medications for this visit.    REVIEW OF SYSTEMS:    10 Point review of Systems was done is negative except as noted above.   PHYSICAL EXAMINATION: TELEPHONE VISIT   LABORATORY DATA:  I have reviewed the data as listed  .  Latest Ref Rng & Units 04/24/2023    2:08 PM 10/17/2022    2:15 PM 04/18/2022    1:15 PM  CBC  WBC 4.0 - 10.5 K/uL 6.5  7.3  6.9   Hemoglobin 12.0 - 15.0 g/dL 16.1  09.6  04.5   Hematocrit 36.0 - 46.0 % 52.4  49.6  50.4   Platelets 150 - 400 K/uL 343  324  340     .    Latest Ref Rng & Units 04/24/2023    2:08 PM 10/17/2022    2:15 PM 04/18/2022    1:15 PM  CMP  Glucose 70 - 99 mg/dL 409  811  914   BUN 8 - 23 mg/dL 11  15  11    Creatinine 0.44 - 1.00 mg/dL 7.82  9.56  2.13   Sodium 135 - 145 mmol/L 139  137  138   Potassium 3.5 - 5.1 mmol/L 4.0  3.9  3.9   Chloride 98 - 111 mmol/L 102  102  102   CO2 22 - 32 mmol/L 26  27  26    Calcium 8.9 - 10.3 mg/dL 9.1  9.3  9.3   Total Protein 6.5 - 8.1 g/dL 7.8  7.7  8.1   Total Bilirubin 0.0 - 1.2 mg/dL 0.5  0.4  0.6   Alkaline Phos 38 - 126 U/L 102  98  125   AST 15 - 41 U/L 15  15  14    ALT 0 - 44 U/L 12  12  11     Lab Results  Component Value Date   FERRITIN 39 04/24/2023   B12 -- 148   Component     Latest Ref Rng & Units 05/08/2016  Parietal Cell Ab     0.0 - 20.0 Units 3.5  Intrinsic Factor Abs, Serum     0.0 - 1.1 AU/mL 0.9   11/06/17 Surgical Biopsy:     RADIOGRAPHIC STUDIES: I have personally  reviewed the radiological images as listed and agreed with the findings in the report. No results found.  ASSESSMENT & PLAN:   89 y.o. caucasian female with   1) status post severe Microcytic Anemia s/p PRBc transfusion in sept 2017.  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 B12 levels are back down to 148.  Patient with lack of compliance with B12 replacement.  PLAN -I discussed goal is to maintain Ferritin > 100 and iron saturation at 20%.  -continue vit B complex 1 tab po daily to support accelerated hematopoiesis.  4.  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   11/04/17 Surgical pathology revealed 9.2cm invasive colorectal adenocarcinoma, clear margins, and three implicated lymph nodes in metastasis, with N1B status.    12/25/17 CT C/A/P revealed  Stable appearance of scattered small pulmonary nodules within both lungs. These remain indeterminate. Recommend continued interval follow-up to ensure stability of these lesions. 2. No mass or adenopathy identified within the chest, abdomen or Pelvis. 3. Stable right adrenal nodule which may represent a benign adenoma. 4.  Aortic Atherosclerosis (ICD10-I70.0). 5. Large hiatal hernia  6. Bilateral thyroid nodules. Nodule in the left lobe demonstrated mild increased FDG uptake on previous PET-CT. Consider further evaluation with thyroid sonogram.  Patient is status post laparoscopic right hemicolectomy by Dr. Johna Sheriff on 11/04/2017.  She had chosen to not pursue any adjuvant chemotherapy.  PLAN:    -Discussed lab results form 04/24/2023 in detail with patient. CBC showed WBC of 6.5K, hemoglobin of 17.1, and platelets of 343K. -her hgb remains borderline high likely due to mild dehydration -No anemia -her other blood counts, including WBCs and platelets are normal -her tumor markers are WNL at 1.3 -Patient has no lab evidence or symptoms suggestive of colon cancer recurrence at this time  -her B12 levels are low at 200 pg/mL. Discussed B12 goal of at least 400 pg/mL -patient has not been taking B12 replacement regulalry 3 days a week as prescribed  -discussed that she is most likely deficient in B12 due to surgery to remove colon cancer affecting the part of the bowel that absorbs vitamin B12 -discussed that it would be very unlikely for vitamin B12 to cause stomach cramping  -recommend taking liquid preparation of B12 at 1000 micrograms or taking sublingual B12. Discussed that if her B12 levels due not improve despite supplementation, there may be a role for B12 injections -Discussed the importance of taking vitamin B12 for memory and nerve function support -recommend also taking vitamin B complex if she chooses to -ferritin stable -there is mild iron deficiency  -there is no indication for IV iron at this time -discussed that her higher glucose levels on lab testing is not abnormal given that she was not fasting during testing  -recommend drinking 60-70 ounces of water daily -educated patient that non-carbonated, non-caffeinated, and non-alcoholic liquids would count towards hydration -will continue to monitor with labs every 6 months  -will order mutation testing with next labs for persistently high hgb to ensure there is no concern for a bone marrow disorder -answered all of patient's and her daughter's questions in detail  4)  . Patient Active Problem List   Diagnosis Date Noted   Primary hypertension    CAP (community acquired pneumonia) 08/22/2021   Educated about COVID-19  virus infection 09/28/2019   Leg swelling 01/28/2018   Bradycardia 01/28/2018   Primary cancer of cecum pT3, pN1b s/p lap colectomy 11/04/2017 11/04/2017   Chronic idiopathic constipation 04/27/2017   Iron deficiency anemia 10/20/2015   B12 deficiency anemia 10/20/2015   Symptomatic anemia 10/19/2015   Allergy 08/17/2015   UTI (urinary tract infection) 07/26/2014   TGA (transient global amnesia) 07/25/2014   Cardiac dysrhythmia 02/06/2010   PALPITATIONS 02/06/2010   Essential hypertension, benign 05/22/2008   PSVT 03/27/2008   OBESITY, UNSPECIFIED 03/26/2008   Abnormal involuntary movement 03/26/2008   AMNESIA, TRANSIENT GLOBAL 01/10/2007  -advised continued f/u with PCP for management of her other medical co-morbids  FOLLOW-UP: Phone visit with Dr. Candise Che in 6 months Labs 1 week prior to phone visit.  The total time spent in the appointment was 23 minutes* .  All of the patient's questions were answered with apparent satisfaction. The patient knows to call the clinic with any problems, questions or concerns.   Wyvonnia Lora MD MS AAHIVMS [facility] Hematology/Oncology Physician [facility]  .*Total Encounter Time as defined by the Centers for Medicare and Medicaid Services includes, in addition to the face-to-face time of a patient visit (documented in the note above) non-face-to-face time: obtaining and reviewing outside history, ordering and reviewing medications, tests  or procedures, care coordination (communications with other health care professionals or caregivers) and documentation in the medical record.    I,Mitra Faeizi,acting as a Neurosurgeon for Wyvonnia Lora, MD.,have documented all relevant documentation on the behalf of Wyvonnia Lora, MD,as directed by  Wyvonnia Lora, MD while in the presence of Wyvonnia Lora, MD.  .I have reviewed the above documentation for accuracy and completeness, and I agree with the above. Johney Maine MD

## 2023-05-04 ENCOUNTER — Telehealth: Payer: Self-pay | Admitting: Hematology

## 2023-05-04 NOTE — Telephone Encounter (Signed)
 Spoke with patient confirming upcoming appointment

## 2023-05-07 ENCOUNTER — Encounter: Payer: Self-pay | Admitting: Hematology

## 2023-08-24 ENCOUNTER — Encounter: Payer: Self-pay | Admitting: Cardiology

## 2023-08-25 NOTE — Telephone Encounter (Signed)
 Spoke with pt's daughter regarding an appointment (per DPR). Pt schedule 8/28 with Dr. Lavona. Pt's daughter verbalized understanding. All questions if any were answered.

## 2023-10-08 DIAGNOSIS — I480 Paroxysmal atrial fibrillation: Secondary | ICD-10-CM | POA: Insufficient documentation

## 2023-10-08 NOTE — Progress Notes (Unsigned)
  Cardiology Office Note:   Date:  10/09/2023  ID:  [name] [name], DOB 08-28-1927, MRN [id number] PCP: Okey Carlin Redbird, MD  [facility] Providers Cardiologist:  Lynwood Schilling, MD {  History of Present Illness:   [name] [name] is a 89 y.o. female who presents for follow up of HTN  and atrial fib.  The patient denies any new symptoms such as chest discomfort, neck or arm discomfort. There has been no new shortness of breath, PND or orthopnea. There have been no reported palpitations, presyncope or syncope.   She lives with her daughter.  She gets around the house with a walker.  She is being followed for cecal cancer but wants conservative therapy.  She actually has done really well.  She sometimes notices her fibrillation but it really does not bother her.  She does not have any chest pressure, neck or arm discomfort.  She does not have any shortness of breath, PND or orthopnea.  She has chronic mild lower extremity swelling.  ROS: As stated in the HPI and negative for all other systems.  Studies Reviewed:    EKG:     12/01/2022 atrial fibrillation with controlled ventricular rate, rightward axis, no acute ST-T wave changes.  Risk Assessment/Calculations:    CHA2DS2-VASc Score = 4   This indicates a 4.8% annual risk of stroke. The patient's score is based upon: CHF History: 0 HTN History: 1 Diabetes History: 0 Stroke History: 0 Vascular Disease History: 0 Age Score: 2 Gender Score: 1   Physical Exam:   VS:  BP (!) 159/76   Pulse 84    Wt Readings from Last 3 Encounters:  12/01/22 195 lb 9.6 oz (88.7 kg)  10/01/21 181 lb 3.2 oz (82.2 kg)  05/27/21 195 lb 1.6 oz (88.5 kg)     GEN: Well nourished, well developed in no acute distress NECK: No JVD; No carotid bruits CARDIAC: Irregular RR, no murmurs, rubs, gallops RESPIRATORY:  Clear to auscultation without rales, wheezing or rhonchi  ABDOMEN: Soft, non-tender, non-distended EXTREMITIES:  Mild leg edema; No  deformity   ASSESSMENT AND PLAN:   ESSENTIAL HYPERTENSION, BENIGN -  Her blood pressure is elevated but she does not want change to her medications.  She think she is doing very well.  She will continue with the meds as listed.    EDEMA:  This is chronic and mild.  She is not having any shortness of breath.  No change in therapy.    ATRIAL FIB:   We had long conversation about anticoagulation.  She has not wanted this.  She has good rate control.  She does not really feel her fibrillation.  No change in therapy.  She does not want to consider a Watchman.  Follow up with me in one year.   Signed, Lynwood Schilling, MD

## 2023-10-09 ENCOUNTER — Encounter: Payer: Self-pay | Admitting: Cardiology

## 2023-10-09 ENCOUNTER — Ambulatory Visit: Payer: MEDICARE | Attending: Cardiology | Admitting: Cardiology

## 2023-10-09 VITALS — BP 159/76 | HR 84

## 2023-10-09 DIAGNOSIS — I10 Essential (primary) hypertension: Secondary | ICD-10-CM | POA: Diagnosis not present

## 2023-10-09 DIAGNOSIS — M7989 Other specified soft tissue disorders: Secondary | ICD-10-CM | POA: Insufficient documentation

## 2023-10-09 DIAGNOSIS — I480 Paroxysmal atrial fibrillation: Secondary | ICD-10-CM | POA: Insufficient documentation

## 2023-10-09 MED ORDER — CLONIDINE HCL 0.1 MG PO TABS: 0.1000 mg | ORAL_TABLET | Freq: Two times a day (BID) | ORAL | 3 refills | Status: AC

## 2023-10-09 MED ORDER — AMLODIPINE BESYLATE 5 MG PO TABS: 5.0000 mg | ORAL_TABLET | Freq: Every day | ORAL | 3 refills | Status: AC

## 2023-10-09 NOTE — Patient Instructions (Signed)
 Medication Instructions:  Your physician recommends that you continue on your current medications as directed. Please refer to the Current Medication list given to you today.  *If you need a refill on your cardiac medications before your next appointment, please call your pharmacy*  Lab Work: NONE If you have labs (blood work) drawn today and your tests are completely normal, you will receive your results only by: MyChart Message (if you have MyChart) OR A paper copy in the mail If you have any lab test that is abnormal or we need to change your treatment, we will call you to review the results.  Testing/Procedures: NONE  Follow-Up: At [facility], you and your health needs are our priority.  As part of our continuing mission to provide you with exceptional heart care, our providers are all part of one team.  This team includes your primary Cardiologist (physician) and Advanced Practice Providers or APPs (Physician Assistants and Nurse Practitioners) who all work together to provide you with the care you need, when you need it.  Your next appointment:   1 year  Provider:   Lavonne Prairie, MD  We recommend signing up for the patient portal called MyChart.  Sign up information is provided on this After Visit Summary.  MyChart is used to connect with patients for Virtual Visits (Telemedicine).  Patients are able to view lab/test results, encounter notes, upcoming appointments, etc.  Non-urgent messages can be sent to your provider as well.   To learn more about what you can do with MyChart, go to [email or web address].

## 2023-10-30 ENCOUNTER — Inpatient Hospital Stay: Payer: MEDICARE | Attending: Hematology

## 2023-10-30 DIAGNOSIS — E538 Deficiency of other specified B group vitamins: Secondary | ICD-10-CM | POA: Diagnosis not present

## 2023-10-30 DIAGNOSIS — C180 Malignant neoplasm of cecum: Secondary | ICD-10-CM

## 2023-10-30 DIAGNOSIS — D509 Iron deficiency anemia, unspecified: Secondary | ICD-10-CM | POA: Diagnosis not present

## 2023-10-30 DIAGNOSIS — Z85038 Personal history of other malignant neoplasm of large intestine: Secondary | ICD-10-CM | POA: Diagnosis not present

## 2023-10-30 DIAGNOSIS — D751 Secondary polycythemia: Secondary | ICD-10-CM

## 2023-10-30 LAB — CBC WITH DIFFERENTIAL (CANCER CENTER ONLY)
Abs Immature Granulocytes: 0.01 K/uL (ref 0.00–0.07)
Basophils Absolute: 0 K/uL (ref 0.0–0.1)
Basophils Relative: 1 %
Eosinophils Absolute: 0.1 K/uL (ref 0.0–0.5)
Eosinophils Relative: 1 %
HCT: 50.8 % — ABNORMAL HIGH (ref 36.0–46.0)
Hemoglobin: 16.6 g/dL — ABNORMAL HIGH (ref 12.0–15.0)
Immature Granulocytes: 0 %
Lymphocytes Relative: 17 %
Lymphs Abs: 1.1 K/uL (ref 0.7–4.0)
MCH: 29.2 pg (ref 26.0–34.0)
MCHC: 32.7 g/dL (ref 30.0–36.0)
MCV: 89.3 fL (ref 80.0–100.0)
Monocytes Absolute: 0.6 K/uL (ref 0.1–1.0)
Monocytes Relative: 10 %
Neutro Abs: 4.6 K/uL (ref 1.7–7.7)
Neutrophils Relative %: 71 %
Platelet Count: 362 K/uL (ref 150–400)
RBC: 5.69 MIL/uL — ABNORMAL HIGH (ref 3.87–5.11)
RDW: 13.2 % (ref 11.5–15.5)
WBC Count: 6.5 K/uL (ref 4.0–10.5)
nRBC: 0 % (ref 0.0–0.2)

## 2023-10-30 LAB — CMP (CANCER CENTER ONLY)
ALT: 11 U/L (ref 0–44)
AST: 17 U/L (ref 15–41)
Albumin: 4.2 g/dL (ref 3.5–5.0)
Alkaline Phosphatase: 106 U/L (ref 38–126)
Anion gap: 6 (ref 5–15)
BUN: 13 mg/dL (ref 8–23)
CO2: 28 mmol/L (ref 22–32)
Calcium: 9.4 mg/dL (ref 8.9–10.3)
Chloride: 104 mmol/L (ref 98–111)
Creatinine: 0.71 mg/dL (ref 0.44–1.00)
GFR, Estimated: >60 mL/min (ref >60)
Glucose, Bld: 155 mg/dL — ABNORMAL HIGH (ref 70–99)
Potassium: 4.1 mmol/L (ref 3.5–5.1)
Sodium: 138 mmol/L (ref 135–145)
Total Bilirubin: 0.4 mg/dL (ref 0.0–1.2)
Total Protein: 7.9 g/dL (ref 6.5–8.1)

## 2023-10-30 LAB — IRON AND IRON BINDING CAPACITY (CC-WL,HP ONLY)
Iron: 84 ug/dL (ref 28–170)
Saturation Ratios: 19 % (ref 10.4–31.8)
TIBC: 438 ug/dL (ref 250–450)
UIBC: 354 ug/dL (ref 148–442)

## 2023-10-30 LAB — FERRITIN: Ferritin: 61 ng/mL (ref 11–307)

## 2023-10-30 LAB — VITAMIN B12: Vitamin B-12: 275 pg/mL (ref 180–914)

## 2023-10-30 LAB — CEA (ACCESS): CEA (CHCC): 1.26 ng/mL (ref 0.00–5.00)

## 2023-11-06 ENCOUNTER — Inpatient Hospital Stay: Payer: MEDICARE | Admitting: Hematology

## 2023-11-06 DIAGNOSIS — E538 Deficiency of other specified B group vitamins: Secondary | ICD-10-CM | POA: Diagnosis not present

## 2023-11-06 DIAGNOSIS — Z85038 Personal history of other malignant neoplasm of large intestine: Secondary | ICD-10-CM | POA: Diagnosis not present

## 2023-11-06 DIAGNOSIS — C180 Malignant neoplasm of cecum: Secondary | ICD-10-CM

## 2023-11-06 DIAGNOSIS — D509 Iron deficiency anemia, unspecified: Secondary | ICD-10-CM | POA: Diagnosis not present

## 2023-11-06 DIAGNOSIS — D751 Secondary polycythemia: Secondary | ICD-10-CM | POA: Diagnosis not present

## 2023-11-06 NOTE — Progress Notes (Signed)
 HEMATOLOGY ONCOLOGY PROGRESS NOTE  Date of service: 11/06/2023  Patient Care Team: [name] Carlin Redbird, MD as PCP - General [name] Agent, MD as PCP - Cardiology (Cardiology) [name] Purchase, MD as Consulting Physician (Gastroenterology) [name] Agent, MD as Consulting Physician (Cardiology) [name] Emaline Brink, MD as Consulting Physician (Hematology)  CHIEF COMPLAINT/PURPOSE OF CONSULTATION: Follow-up for colon cancer and iron deficiency Follow-up for polycythemia  HISTORY OF PRESENTING ILLNESS: [name] [name] is [name] wonderful 89 y.o. female who has been referred to us  by Dr .[name], Carlin Redbird, MD for evaluation and management of microcytic anemia.   Patient has [name] h/o HTN, atrial tachycardia, , benign tremor who was noted to have [name] new severe microcytic anemia and weakness with [name] drop of hgb down to 7.4 in sept 2017. She was admitted to the hospital and received 1 unit of PRBC. She was noted to have severe new iron deficiency and also noted to have B12 deficiency. Her stool occult blood was apparently neg. Was discharged on PO iron and was taking it for [name] few months. Rpt stool studies with PCP were again noted to be hemoccult neg. She received B12 IM x 1 in the hospital but has not been on any B12 since then. Patient had refused [name] GI workup in the hospital and was to be seen by Dr [name] [name] for GI workup in the outpatient setting but has refused and continues to refuse and GI workup.   Denies any overt GI bleeding. No melena no hematemesis no hematochezia. No nausea/vomiting or abdominal pain. No significant weight loss recently.   Her hgb improved some on Po iron but then started dropping again and so she was referred to us  for consideration of IV iron replacement.   On CXR in the hospital she was incidentally noted to have [name] moderate to large hiatal hernia.  SUMMARY OF ONCOLOGIC HISTORY: Oncology History   No history exists.    INTERVAL HISTORY:  [name] [name] is [name] 89 y.o.  female being followed today for colon cancer, iron deficiency, and elevated hemoglobin.   I connected with  [name] [name] on 11/06/23 by [name] video enabled telemedicine application and verified that I am speaking with the correct person using two identifiers.  Patient Location: home Provider Location: office Other persons involved in this telephone visit: medical scribe, [name] [name], and [name] [name]'s daughter.   I discussed the limitations of evaluation and management by telemedicine. The patient expressed understanding and agreed to proceed.  she was last seen by me on 05/01/2023; at the time she mentioned experiencing stomach cramping attributed to Vitamin B12 use, so had discontinued that, as well as occasional constipation in the mornings.   Today, she does not mention any new concerns. She is still staying well-nourished, eating 3 meals daily. Denies changes in bowel habits or hematochezia/melena. She has not resumed taking Vitamin B12.   REVIEW OF SYSTEMS:    10 Point review of systems of done and is negative except as noted above.  MEDICAL HISTORY Past Medical History:  Diagnosis Date   Anemia    Atrial fibrillation (HCC)    Atrial tachycardia    Cancer (HCC)    colon   CHICKENPOX, HX OF 09/07/2008   Qualifier: Diagnosis of  By: [name] [name]    Cough    yellow to clear sputum cough for [name] while per pt no fever   HCAP (healthcare-associated pneumonia) 10/31/2015   History of blood transfusion last transfusion 09-29-17   HTN (  hypertension)    Jaundice due to hepatitis    at age 35 or 65   Pneumonia 2014   Transient global amnesia few yrs ago x 2   AMS   Tremors of nervous system    benign familial  Large Hiatal Hernia HCAP 10/2015 Iron Deficiency Anemia B12 Deficiency  SURGICAL HISTORY Past Surgical History:  Procedure Laterality Date   ABDOMINAL HERNIA REPAIR  yrs ago   BACK SURGERY     years ago lower   LAPAROSCOPIC PARTIAL COLECTOMY N/[name] 11/04/2017    Procedure: LAPAROSCOPIC RIGHT HEMICOLECTOMY;  Surgeon: [name] Katz, MD;  Location: [facility];  Service: General;  Laterality: N/[name];   TONSILLECTOMY  age 83    SOCIAL HISTORY Social History   Tobacco Use   Smoking status: Never   Smokeless tobacco: Never   Tobacco comments:    + prior 2nd hand exposure from spouse smoking  Vaping Use   Vaping status: Never Used  Substance Use Topics   Alcohol use: No    Alcohol/week: 0.0 standard drinks of alcohol   Drug use: No    Social History   Social History Narrative   Not on file    SOCIAL DRIVERS OF HEALTH SDOH Screenings   Tobacco Use: Low Risk  (10/09/2023)     FAMILY HISTORY Family History  Problem Relation Age of Onset   Lung cancer Mother    Parkinson's disease Mother    Hypertension Mother    Other Father        blood clot   Arthritis Sister    Other Sister        spinal stenosis     ALLERGIES: is allergic to aspirin, codeine, hydrochlorothiazide, penicillins, sulfa antibiotics, sulfonamide derivatives, biotin, levofloxacin, macrodantin  [nitrofurantoin ], vitamin b12, vitamin d analogs, and latex.  MEDICATIONS  Current Outpatient Medications  Medication Sig Dispense Refill   amLODipine  (NORVASC ) 5 MG tablet Take 1 tablet (5 mg total) by mouth daily. 90 tablet 3   cloNIDine  (CATAPRES ) 0.1 MG tablet Take 1 tablet (0.1 mg total) by mouth 2 (two) times daily. 180 tablet 3   No current facility-administered medications for this visit.    PHYSICAL EXAMINATION; TELEPHONE VISIT: ECOG PERFORMANCE STATUS: 1 - Symptomatic, but completely ambulatory  GENERAL: sounds alert, in no acute distress and comfortable PSYCH: sounds alert & oriented x 3 with fluent speech  LABORATORY DATA:   I have reviewed the data as listed     Latest Ref Rng & Units 10/30/2023    2:22 PM 04/24/2023    2:08 PM 10/17/2022    2:15 PM  CBC EXTENDED  WBC 4.0 - 10.5 K/uL 6.5  6.5  7.3   RBC 3.87 - 5.11 MIL/uL 5.69  5.75  5.49   Hemoglobin  12.0 - 15.0 g/dL 83.3  82.8  83.6   HCT 36.0 - 46.0 % 50.8  52.4  49.6   Platelets 150 - 400 K/uL 362  343  324   NEUT# 1.7 - 7.7 K/uL 4.6  4.6  5.5   Lymph# 0.7 - 4.0 K/uL 1.1  1.3  1.0    Iron/TIBC/Ferritin/ %Sat Lab Results  Component Value Date   IRON 84 10/30/2023   IRON 100 04/24/2023   IRON 79 10/17/2022   TIBC 438 10/30/2023   TIBC 420 04/24/2023   TIBC 468 (H) 10/17/2022   FERRITIN 61 10/30/2023   FERRITIN 39 04/24/2023   FERRITIN 26 10/17/2022   IRONPCTSAT 19 10/30/2023   IRONPCTSAT 24 04/24/2023  IRONPCTSAT 17 10/17/2022   Lab Results  Component Value Date   VITAMINB12 275 10/30/2023   VITAMINB12 200 04/24/2023   VITAMINB12 277 10/17/2022   Component     Latest Ref Rng & Units 05/08/2016  Parietal Cell Ab     0.0 - 20.0 Units 3.5  Intrinsic Factor Abs, Serum     0.0 - 1.1 AU/mL 0.9    CEA ([facility])     0.00 - 5.00 ng/mL 10/30/2023   1.26       Latest Ref Rng & Units 10/30/2023    2:22 PM 04/24/2023    2:08 PM 10/17/2022    2:15 PM  CMP  Glucose 70 - 99 mg/dL 844  834  830   BUN 8 - 23 mg/dL 13  11  15    Creatinine 0.44 - 1.00 mg/dL 9.28  9.29  9.27   Sodium 135 - 145 mmol/L 138  139  137   Potassium 3.5 - 5.1 mmol/L 4.1  4.0  3.9   Chloride 98 - 111 mmol/L 104  102  102   CO2 22 - 32 mmol/L 28  26  27    Calcium 8.9 - 10.3 mg/dL 9.4  9.1  9.3   Total Protein 6.5 - 8.1 g/dL 7.9  7.8  7.7   Total Bilirubin 0.0 - 1.2 mg/dL 0.4  0.5  0.4   Alkaline Phos 38 - 126 U/L 106  102  98   AST 15 - 41 U/L 17  15  15    ALT 0 - 44 U/L 11  12  12     11/06/17 Surgical Biopsy:   RADIOGRAPHIC STUDIES: I have personally reviewed the radiological images as listed and agreed with the findings in the report. No results found.  ASSESSMENT & PLAN:  89 y.o. female with  1) status post severe Microcytic Anemia s/p PRBc transfusion in sept 2017.  This appears to be likely related to severe iron deficiency due to cecal adenocarcinoma   2) status post severe Iron  deficiency- due to chronic GI losses from cecal adenocarcinoma    3) B12 deficiency - antiparietal cell and anti IF ab neg. B12 levels improved from 200 to 275 since March. Patient continues with lack of compliance with B12 replacement.  4.  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    11/04/17 Surgical pathology revealed 9.2cm invasive colorectal adenocarcinoma, clear margins, and three implicated lymph nodes in metastasis, with N1B status.     12/25/17 CT C/[name]/P revealed  Stable appearance of scattered small pulmonary nodules within both lungs. These remain indeterminate. Recommend continued interval follow-up to ensure stability of these lesions. 2. No mass or adenopathy identified within the chest, abdomen or Pelvis. 3. Stable right adrenal nodule which may represent [name] benign adenoma. 4.  Aortic Atherosclerosis (ICD10-I70.0). 5. Large hiatal hernia 6. Bilateral thyroid  nodules. Nodule in the left lobe demonstrated mild increased FDG uptake on previous PET-CT. Consider further evaluation with thyroid  sonogram.   Patient is status post laparoscopic right hemicolectomy by Dr. Mikell on 11/04/2017.  She had chosen to not pursue any adjuvant chemotherapy.   PLAN  1; 2 - Discussed lab results from 10/30/2023 in detail with patient: CBC showed WBC of 6.5K, hemoglobin of 16.6 << 17.1, and platelets of 362K. - Hgb remains borderline high, but did decrease from prior labs. -  Ferritin improved further since last visit. - JAK2 mutation testing pending.  3 - Non-compliance with B12 supplementation - Recommend taking liquid preparation of B12  - Discussed the importance of taking vitamin B12 for memory and nerve function support  4 - Patient has no lab evidence or symptoms suggestive of colon cancer recurrence at this time    - will continue to monitor with labs annually - answered all of patient's and her daughter's questions in detail  FOLLOW-UP in 1 year with Dr. Onesimo, with labs.  .The total time spent in the appointment was 20 minutes* .  All of the patient's questions were answered with apparent satisfaction. The patient knows to call the clinic with any problems, questions or concerns.   Emaline Onesimo MD MS AAHIVMS [facility] Hematology/Oncology Physician [facility] [place]  .*Total Encounter Time as defined by the Centers for Medicare and Medicaid Services includes, in addition to the face-to-face time of [name] patient visit (documented in the note above) non-face-to-face time: obtaining and reviewing outside history, ordering and reviewing medications, tests or procedures, care coordination (communications with other health care professionals or caregivers) and documentation in the medical record.  I,Emily Lagle,acting as [name] Neurosurgeon for Emaline Onesimo, MD.,have documented all relevant documentation on the behalf of Emaline Onesimo, MD,as directed by  Emaline Onesimo, MD while in the presence of Emaline Onesimo, MD.  I have reviewed the above documentation for accuracy and completeness, and I agree with the above.  Emaline Onesimo, MD   ADDENDUM JAK2 V617F rfx CALR/MPL/E12-15 Order: [id number]  Status: Edited Result - FINAL     Next appt: 11/08/2024 at 01:00 PM in Oncology [facility])     Dx: Polycythemia; Primary cancer of cecum...   Test Result Released: Yes (seen)   0 Result Notes    Component Ref Range & Units (hover) 2 wk ago  Specimen Type Comment:  Comment: NOT PROVIDED  JAK2 V617F Result Comment  Comment: (NOTE) POSITIVE The JAK2 V617F mutation is detected in the provided specimen of this individual. Results should be interpreted in conjunction with clinical and other laboratory findings for the most accurate interpretation. This test was developed and its performance  characteristics determined by [facility]. It has not been cleared or approved by the Food and Drug Administration.  JAK2 V617F % 2.52 VC  Reflex Comment  Comment: (NOTE) Reflex to CALR Mutation Analysis, JAK2 Exon 12-15 Mutation Analysis, and MPL Mutation Analysis is not indicated.     JAK2 mutation positive overall picture would be consistent with positive polycythemia vera with [name] hemoglobin of 16.6 and hematocrit of 50.8. - I tried to call the listed phone number for the daughter and left [name] message. - Will try to call back again. - In the context of her polycythemia vera we would recommend starting aspirin 81 mg p.o. daily and would generally recommend therapeutic phlebotomies to keep the patient's hematocrit closer to 45%. - However at this 89 year old she may not tolerate the therapeutic phlebotomies well and we shall set up [name] visit to discuss her goals of care around this and her personal preference. - Patient would be at increased risk of blood clots  .Emaline Candida Onesimo MD

## 2023-11-09 ENCOUNTER — Encounter: Payer: Self-pay | Admitting: Hematology

## 2023-11-09 ENCOUNTER — Telehealth: Payer: Self-pay | Admitting: Hematology

## 2023-11-09 LAB — JAK2 V617F RFX CALR/MPL/E12-15: JAK2 V617F %: 2.52 %

## 2023-11-09 NOTE — Telephone Encounter (Signed)
 I LVM informing Narya of her follow up appointment. I asked her to return my call if she needs to re-schedule her appointments.

## 2023-11-16 ENCOUNTER — Encounter: Payer: Self-pay | Admitting: Hematology

## 2023-11-16 DIAGNOSIS — H6121 Impacted cerumen, right ear: Secondary | ICD-10-CM | POA: Diagnosis not present

## 2023-11-16 DIAGNOSIS — Z6834 Body mass index (BMI) 34.0-34.9, adult: Secondary | ICD-10-CM | POA: Diagnosis not present

## 2023-11-16 DIAGNOSIS — H9201 Otalgia, right ear: Secondary | ICD-10-CM | POA: Diagnosis not present

## 2023-11-17 ENCOUNTER — Telehealth: Payer: Self-pay | Admitting: Hematology

## 2023-11-17 NOTE — Telephone Encounter (Signed)
 Patient's daughter [name] [name] and discussed her JAK2 positive mutation results with her consistent with a diagnosis of likely polycythemia vera. Patient is reportedly having regular hemorrhoidal bleeding at this time and has not like to be on baby aspirin even for her known atrial fibrillation. She is unlikely to tolerate therapeutic phlebotomies well with concerns of dizziness and falls. I recommended that we manage her conservatively and hold off on therapeutic phlebotomies and aspirin and monitor her labs again in 6 months.  Her hematocrit is been fairly stable over the last 1 to 2 years and do not seem to be increasing very rapidly.  Patient's daughter was counseled about the risk of blood clots at this time and she will be aware of this.  She is going to discuss all this information with the patient and let us  know if she has any other questions.

## 2024-11-08 ENCOUNTER — Other Ambulatory Visit: Payer: MEDICARE

## 2024-11-15 ENCOUNTER — Telehealth: Payer: MEDICARE | Admitting: Hematology
